# Patient Record
Sex: Male | Born: 1955
Health system: Southern US, Community
[De-identification: ages and names within clinical notes are randomized; demographics above are authoritative.]

## PROBLEM LIST (undated history)

## (undated) DIAGNOSIS — J449 Chronic obstructive pulmonary disease, unspecified: Secondary | ICD-10-CM

## (undated) DIAGNOSIS — M1990 Unspecified osteoarthritis, unspecified site: Secondary | ICD-10-CM

## (undated) DIAGNOSIS — K08109 Complete loss of teeth, unspecified cause, unspecified class: Secondary | ICD-10-CM

## (undated) DIAGNOSIS — Z87442 Personal history of urinary calculi: Secondary | ICD-10-CM

## (undated) DIAGNOSIS — T7840XA Allergy, unspecified, initial encounter: Secondary | ICD-10-CM

## (undated) DIAGNOSIS — Z972 Presence of dental prosthetic device (complete) (partial): Secondary | ICD-10-CM

## (undated) DIAGNOSIS — Z973 Presence of spectacles and contact lenses: Secondary | ICD-10-CM

## (undated) DIAGNOSIS — F419 Anxiety disorder, unspecified: Secondary | ICD-10-CM

## (undated) DIAGNOSIS — M109 Gout, unspecified: Secondary | ICD-10-CM

## (undated) DIAGNOSIS — R0600 Dyspnea, unspecified: Secondary | ICD-10-CM

## (undated) DIAGNOSIS — F1020 Alcohol dependence, uncomplicated: Secondary | ICD-10-CM

## (undated) DIAGNOSIS — I10 Essential (primary) hypertension: Secondary | ICD-10-CM

## (undated) DIAGNOSIS — K5792 Diverticulitis of intestine, part unspecified, without perforation or abscess without bleeding: Secondary | ICD-10-CM

## (undated) HISTORY — DX: Chronic obstructive pulmonary disease, unspecified: J44.9

## (undated) HISTORY — DX: Allergy, unspecified, initial encounter: T78.40XA

## (undated) HISTORY — PX: HERNIA REPAIR: SHX51

## (undated) HISTORY — DX: Diverticulitis of intestine, part unspecified, without perforation or abscess without bleeding: K57.92

## (undated) HISTORY — DX: Anxiety disorder, unspecified: F41.9

## (undated) HISTORY — DX: Essential (primary) hypertension: I10

## (undated) HISTORY — DX: Alcohol dependence, uncomplicated: F10.20

## (undated) HISTORY — PX: COLONOSCOPY: SHX174

---

## 1988-12-12 HISTORY — PX: FINGER ARTHROPLASTY: SHX5017

## 2000-12-26 ENCOUNTER — Encounter: Payer: Self-pay | Admitting: Urology

## 2000-12-26 ENCOUNTER — Encounter: Admission: RE | Admit: 2000-12-26 | Discharge: 2000-12-26 | Payer: Self-pay | Admitting: Urology

## 2001-04-29 ENCOUNTER — Emergency Department (HOSPITAL_COMMUNITY): Admission: EM | Admit: 2001-04-29 | Discharge: 2001-04-29 | Payer: Self-pay | Admitting: Emergency Medicine

## 2001-04-30 ENCOUNTER — Encounter: Payer: Self-pay | Admitting: Emergency Medicine

## 2001-05-04 ENCOUNTER — Inpatient Hospital Stay (HOSPITAL_COMMUNITY): Admission: EM | Admit: 2001-05-04 | Discharge: 2001-05-11 | Payer: Self-pay | Admitting: Emergency Medicine

## 2001-05-05 ENCOUNTER — Encounter: Payer: Self-pay | Admitting: Internal Medicine

## 2001-05-06 ENCOUNTER — Encounter: Payer: Self-pay | Admitting: Internal Medicine

## 2001-05-08 ENCOUNTER — Encounter: Payer: Self-pay | Admitting: Cardiology

## 2001-05-09 ENCOUNTER — Encounter: Payer: Self-pay | Admitting: Infectious Diseases

## 2001-05-10 ENCOUNTER — Encounter: Payer: Self-pay | Admitting: Internal Medicine

## 2008-04-03 ENCOUNTER — Ambulatory Visit: Payer: Self-pay | Admitting: Internal Medicine

## 2008-04-07 ENCOUNTER — Telehealth (INDEPENDENT_AMBULATORY_CARE_PROVIDER_SITE_OTHER): Payer: Self-pay

## 2008-04-10 ENCOUNTER — Ambulatory Visit: Payer: Self-pay | Admitting: Internal Medicine

## 2008-04-10 ENCOUNTER — Encounter (INDEPENDENT_AMBULATORY_CARE_PROVIDER_SITE_OTHER): Payer: Self-pay | Admitting: *Deleted

## 2008-04-11 ENCOUNTER — Encounter: Payer: Self-pay | Admitting: Internal Medicine

## 2008-04-12 ENCOUNTER — Inpatient Hospital Stay (HOSPITAL_COMMUNITY): Admission: EM | Admit: 2008-04-12 | Discharge: 2008-04-14 | Payer: Self-pay | Admitting: Emergency Medicine

## 2008-04-17 ENCOUNTER — Ambulatory Visit: Payer: Self-pay | Admitting: Gastroenterology

## 2009-12-12 DIAGNOSIS — K5792 Diverticulitis of intestine, part unspecified, without perforation or abscess without bleeding: Secondary | ICD-10-CM

## 2009-12-12 HISTORY — PX: COLON SURGERY: SHX602

## 2009-12-12 HISTORY — DX: Diverticulitis of intestine, part unspecified, without perforation or abscess without bleeding: K57.92

## 2010-07-07 ENCOUNTER — Encounter: Admission: RE | Admit: 2010-07-07 | Discharge: 2010-07-07 | Payer: Self-pay | Admitting: Family Medicine

## 2010-07-07 ENCOUNTER — Encounter: Payer: Self-pay | Admitting: Internal Medicine

## 2010-07-11 ENCOUNTER — Encounter: Payer: Self-pay | Admitting: Internal Medicine

## 2010-08-04 ENCOUNTER — Telehealth: Payer: Self-pay | Admitting: Internal Medicine

## 2010-08-06 ENCOUNTER — Encounter (INDEPENDENT_AMBULATORY_CARE_PROVIDER_SITE_OTHER): Payer: Self-pay | Admitting: *Deleted

## 2010-08-11 ENCOUNTER — Telehealth: Payer: Self-pay | Admitting: Internal Medicine

## 2010-08-13 ENCOUNTER — Ambulatory Visit: Payer: Self-pay | Admitting: Internal Medicine

## 2010-08-13 DIAGNOSIS — R1032 Left lower quadrant pain: Secondary | ICD-10-CM | POA: Insufficient documentation

## 2010-08-13 DIAGNOSIS — K5730 Diverticulosis of large intestine without perforation or abscess without bleeding: Secondary | ICD-10-CM | POA: Insufficient documentation

## 2010-08-13 DIAGNOSIS — K5732 Diverticulitis of large intestine without perforation or abscess without bleeding: Secondary | ICD-10-CM | POA: Insufficient documentation

## 2010-08-13 DIAGNOSIS — Z860100 Personal history of colon polyps, unspecified: Secondary | ICD-10-CM | POA: Insufficient documentation

## 2010-08-13 DIAGNOSIS — R930 Abnormal findings on diagnostic imaging of skull and head, not elsewhere classified: Secondary | ICD-10-CM | POA: Insufficient documentation

## 2010-08-24 ENCOUNTER — Telehealth: Payer: Self-pay | Admitting: Internal Medicine

## 2010-08-27 ENCOUNTER — Ambulatory Visit: Payer: Self-pay | Admitting: Internal Medicine

## 2010-08-27 LAB — CONVERTED CEMR LAB
Basophils Absolute: 0 10*3/uL (ref 0.0–0.1)
Basophils Relative: 0.4 % (ref 0.0–3.0)
CRP, High Sensitivity: 8.68 — ABNORMAL HIGH (ref 0.00–5.00)
Eosinophils Absolute: 0.1 10*3/uL (ref 0.0–0.7)
Eosinophils Relative: 2.1 % (ref 0.0–5.0)
HCT: 42.3 % (ref 39.0–52.0)
Hemoglobin: 15.2 g/dL (ref 13.0–17.0)
Lymphocytes Relative: 33.7 % (ref 12.0–46.0)
Lymphs Abs: 1.6 10*3/uL (ref 0.7–4.0)
MCHC: 35.9 g/dL (ref 30.0–36.0)
MCV: 103.5 fL — ABNORMAL HIGH (ref 78.0–100.0)
Monocytes Absolute: 0.5 10*3/uL (ref 0.1–1.0)
Monocytes Relative: 10 % (ref 3.0–12.0)
Neutro Abs: 2.6 10*3/uL (ref 1.4–7.7)
Neutrophils Relative %: 53.8 % (ref 43.0–77.0)
Platelets: 118 10*3/uL — ABNORMAL LOW (ref 150.0–400.0)
RBC: 4.09 M/uL — ABNORMAL LOW (ref 4.22–5.81)
RDW: 13.6 % (ref 11.5–14.6)
WBC: 4.8 10*3/uL (ref 4.5–10.5)

## 2010-08-30 ENCOUNTER — Encounter: Payer: Self-pay | Admitting: Internal Medicine

## 2010-08-30 ENCOUNTER — Telehealth: Payer: Self-pay | Admitting: Physician Assistant

## 2010-09-01 ENCOUNTER — Telehealth: Payer: Self-pay | Admitting: Internal Medicine

## 2010-09-13 ENCOUNTER — Telehealth: Payer: Self-pay | Admitting: Physician Assistant

## 2010-09-13 ENCOUNTER — Encounter: Payer: Self-pay | Admitting: Internal Medicine

## 2010-09-16 ENCOUNTER — Telehealth: Payer: Self-pay | Admitting: Internal Medicine

## 2010-09-16 ENCOUNTER — Ambulatory Visit: Payer: Self-pay | Admitting: Internal Medicine

## 2010-09-16 DIAGNOSIS — R935 Abnormal findings on diagnostic imaging of other abdominal regions, including retroperitoneum: Secondary | ICD-10-CM | POA: Insufficient documentation

## 2010-09-21 ENCOUNTER — Telehealth: Payer: Self-pay | Admitting: Physician Assistant

## 2010-09-27 ENCOUNTER — Ambulatory Visit (HOSPITAL_COMMUNITY): Admission: RE | Admit: 2010-09-27 | Discharge: 2010-09-27 | Payer: Self-pay | Admitting: Internal Medicine

## 2010-10-05 ENCOUNTER — Inpatient Hospital Stay (HOSPITAL_COMMUNITY): Admission: EM | Admit: 2010-10-05 | Discharge: 2010-10-11 | Payer: Self-pay | Admitting: Emergency Medicine

## 2010-10-06 ENCOUNTER — Encounter (INDEPENDENT_AMBULATORY_CARE_PROVIDER_SITE_OTHER): Payer: Self-pay | Admitting: *Deleted

## 2010-10-19 ENCOUNTER — Telehealth: Payer: Self-pay | Admitting: Internal Medicine

## 2010-10-26 ENCOUNTER — Encounter: Payer: Self-pay | Admitting: Internal Medicine

## 2010-10-27 ENCOUNTER — Telehealth: Payer: Self-pay | Admitting: Internal Medicine

## 2010-11-09 ENCOUNTER — Telehealth (INDEPENDENT_AMBULATORY_CARE_PROVIDER_SITE_OTHER): Payer: Self-pay | Admitting: *Deleted

## 2010-11-22 ENCOUNTER — Inpatient Hospital Stay (HOSPITAL_COMMUNITY)
Admission: RE | Admit: 2010-11-22 | Discharge: 2010-11-27 | Payer: Self-pay | Source: Home / Self Care | Attending: Surgery | Admitting: Surgery

## 2010-11-22 ENCOUNTER — Encounter (INDEPENDENT_AMBULATORY_CARE_PROVIDER_SITE_OTHER): Payer: Self-pay | Admitting: Surgery

## 2011-01-01 ENCOUNTER — Encounter: Payer: Self-pay | Admitting: Internal Medicine

## 2011-01-11 NOTE — Procedures (Signed)
Summary: Colon   Colonoscopy  Procedure date:  04/10/2008  Findings:      Location:  [facility].    Colonoscopy  Procedure date:  04/10/2008  Findings:      Location:  [facility].   Patient Name: Jonathan Harrington, Jonathan Harrington MRN:  Procedure Procedures: Colonoscopy CPT: 03474.    with polypectomy. CPT: [id number].  Personnel: Endoscopist: [name]. [name], MD.  Referred By: Abbe Amsterdam, MD.  Exam Location: Exam performed in Outpatient Clinic. Outpatient  Patient Consent: Procedure, Alternatives, Risks and Benefits discussed, consent obtained, from patient. Consent was obtained by the RN.  Indications Symptoms: Hematochezia. Change in bowel habits.  Average Risk Screening Routine.  History  Current Medications: Patient is not currently taking Coumadin.  Pre-Exam Physical: Performed Apr 10, 2008. Cardio-pulmonary exam, Rectal exam, HEENT exam , Abdominal exam, Mental status exam WNL.  Comments: Pt. history reviewed/updated, physical exam performed prior to initiation of sedation?yes Exam Exam: Extent of exam reached: Cecum, extent intended: Cecum.  The cecum was identified by appendiceal orifice and IC valve. Patient position: on left side. Time to Cecum: 00:03:33. Time for Withdrawl: 00:07:25. Colon retroflexion performed. Images taken. ASA Classification: I. Tolerance: excellent.  Monitoring: Pulse and BP monitoring, Oximetry used. Supplemental O2 given.  Colon Prep Used MOVI PREP for colon prep. Prep results: excellent.  Sedation Meds: Patient assessed and found to be appropriate for moderate (conscious) sedation. Fentanyl 100 mcg. given IV. Versed 10 mg. given IV. BENADRYL 25 given IV.  Findings POLYP: Ascending Colon, Maximum size: 3 mm. Procedure:  snare without cautery, removed, retrieved, Polyp sent to pathology. ICD9: Colon Polyps: 211.3.  NORMAL EXAM: Cecum. Comments: traumatic submucosal hemorrhage right colon.  - DIVERTICULOSIS:  Ascending Colon to Sigmoid Colon. ICD9: Diverticulosis, Colon: 562.10. Comments: Marked changes w/ stenosis @25cm .  POLYP: Rectum, Maximum size: 3 mm. Procedure:  snare without cautery, removed, retrieved, sent to pathology. ICD9: Colon Polyps: 211.3.  HEMORRHOIDS: Internal. ICD9: Hemorrhoids, Internal: 455.0.   Assessment  Diagnoses: 455.0: Hemorrhoids, Internal. Cause for recent bleeding.  562.10: Diverticulosis, Colon. Marked changes w/ stenosis.  211.3: Colon Polyps.  x 2.   Events  Unplanned Interventions: No intervention was required.  Unplanned Events: There were no complications. Plans Patient Education: Patient given standard instructions for: Hemorrhoids.  Disposition: After procedure patient sent to recovery. After recovery patient sent home.  Scheduling/Referral: Colonoscopy, to [name]. [name], MD, in 5 years if polyp(s) adenomatous; otherwise 10 years,    This report was created from the original endoscopy report, which was reviewed and signed by the above listed endoscopist.

## 2011-01-11 NOTE — Letter (Signed)
Summary: [facility]   Imported By: Sherian Rein 11/01/2010 11:51:32  _____________________________________________________________________  External Attachment:    Type:   Image     Comment:   External Document

## 2011-01-11 NOTE — Progress Notes (Signed)
Summary: Triage  Phone Note Call from Patient Call back at [phone]   Caller: Patient Call For: Mike Gip Reason for Call: Talk to Nurse Summary of Call: Feels like he has a flareup of Diverticulitis and has COL sch'd for Wed. Would like to discuss Initial call taken by: Karna Christmas,  September 13, 2010 10:20 AM  Follow-up for Phone Call        The pt stated that starting this AM,  he has had 3-4 normal BM's but frequent with cramping.  He said he has his Colonoscopy on Wed 09-15-10.  He said when he was taking Cipro before, he increased his dose from 1 twice daily to 1  and 1/4 tab twice daily and that was effective. Pt concerned and said he was told to call.  Follow-up by: Joselyn Glassman,  September 13, 2010 1:56 PM  Additional Follow-up for Phone Call Additional follow up Details #1::        MY LAST NOTE FROM 08/27/10-PLAN WAS FOR HIM TO STAY ON CIPRO 500 MG  TWICE DAILY THRU THE COLONOSCOPY ON THE 5TH. WHEN DID HE STOP IT ?   YES HE SHOULD GO BACK ON CIPRO 500 MG TWICE DAILY,AND PROBABLY GOOD IDEA TO RESCHEDULE THE COLON FOR NEXT WEEKOR THE WEEK AFTER.   STAY ON CIPRO FOR 2 MORE WEEKS. Additional Follow-up by: Peterson Ao,  September 13, 2010 3:39 PM    Additional Follow-up for Phone Call Additional follow up Details #2::    I sent the pt a letter with the date of the rescheduled Colonoscopy for 09-23-10.  I also mailed him the new patient instrucitons.  I advised him I sent another presccription for more cipro if he needs it.    I let him know Amy thinks he needs to take Cipro for another two weeks and rescheule the Colonoscopy from 09-15-10 to 1-2 weeks out.  I did pick 09-23-10.    Follow-up by: Joselyn Glassman,  September 13, 2010 4:28 PM  Prescriptions: CIPROFLOXACIN HCL 500 MG TABS (CIPROFLOXACIN HCL) Take 1 by mouth twice daily  #14 x 0   Entered by:   Lowry Ram NCMA   Authorized by:   Sammuel Cooper PA-c   Signed by:   Lowry Ram NCMA on 09/13/2010  Method used:   [facility]  [address] [id number]       [address]       [place], [place]  [address]       Ph: [phone] or [phone]       Fax: [phone]   RxID:   [id number]

## 2011-01-11 NOTE — Assessment & Plan Note (Signed)
Summary: f/u diverticulitis-Perry pt.   History of Present Illness Visit Type: Follow-up Visit Primary GI MD: Yancey Flemings MD Primary Vern Guerette: Placido Sou Requesting Breckon Reeves: na Chief Complaint: Bloating and loose stools  History of Present Illness:   PLEASANT 55 YO MALE KNOWN TO DR. PERRY WHO WAS SEEN ON 08/13/10.HE HAS BEEN HAVING RECURRENT EPISODES OF DIVERTICULITIS. AT THAT TIME HE WAS IMPROVING ON A COURSE OF AVELOX AND IT WAS EXTENDED OUT TO COMPLETE 10 DAYS. HE WAS ASLO SCHEDULES FOR A FOLLOW UP COLONOSCOPY WITH DR. PERRY ON 09/15/10. HE HAD A CT IN JULY WHICH RAISED THE QUESTION OF A NEOPLASM IN THE SIGMOID COLON WITH MARKEDLY THICKENED COLON IN THAT SEGMENT. WE HAVE ALSO DISCUSSED   ELETIVE SIGMOID RESECTION AND HE IS WANTING TO PROCEED WITH THIS.   MR. Maudlin COMES BACK IN TODAY WITH RECURRENT SXS. HE FINISHED THE AVELOX AND WAS DOING WELL FOR ABOUT A WEEK THEN THE PAIN AND CRAMPING RECURRED. NO DOCUMENTED FEVER,SWEATS. NO N/V. HE CALLED AND WAS TO START CIPRO AND FLAGYL ON  9/13 BUT WAS RELUCTANT AS HE IS AN ALCOHOLIC  AND DID NOT WANT TO TAKE THE FLAGYL. HE HAS NOW STARTED THE CIPRO AND IS FEELING A LITTLE BETTER. PAIN IS NOW A 4-5/10 WAS( 8)  . BM' FAIRLY NORMAL. NO DYSURIA.    GI Review of Systems    Reports abdominal pain and  bloating.     Location of  Abdominal pain: LLQ.    Denies acid reflux, belching, chest pain, dysphagia with liquids, dysphagia with solids, heartburn, loss of appetite, nausea, vomiting, vomiting blood, weight loss, and  weight gain.      Reports diverticulosis.     Denies anal fissure, black tarry stools, change in bowel habit, constipation, diarrhea, fecal incontinence, heme positive stool, hemorrhoids, irritable bowel syndrome, jaundice, light color stool, liver problems, rectal bleeding, and  rectal pain.    Current Medications (verified): 1)  Xanax 1 Mg Tabs (Alprazolam) .... 1/2 By Mouth Once Daily 2)  Ciprofloxacin Hcl 500 Mg Tabs  (Ciprofloxacin Hcl) .... Take 1 P.o. B.i.d. For 14 Days 3)  Goodys Body Pain 500-325 Mg Pack (Aspirin-Acetaminophen) .... As Needed  Allergies (verified): No Known Drug Allergies  Past History:  Past Medical History: DIVERTICULOSIS/RECURRENT DIVERTICULITIS GOUT ADENOMATOUS COLON POLYPS ALCOHOL ABUSE  Past Surgical History: Reviewed history from 08/13/2010 and no changes required. Surgery on left index finger  Family History: Reviewed history from 08/13/2010 and no changes required. No FH of Colon Cancer: Lung Cancer-Sister  Social History: Reviewed history from 08/13/2010 and no changes required. Patient currently smokes. 1 1/2 PPD Alcohol Use - yes   daily 2 SHOTS AND FEW BEERS Illicit Drug Use - no Daily Caffeine Use  coffee in the AM Occupation: control specialist  Review of Systems       The patient complains of fatigue.  The patient denies allergy/sinus, anemia, anxiety-new, arthritis/joint pain, back pain, blood in urine, breast changes/lumps, change in vision, confusion, cough, coughing up blood, depression-new, fainting, fever, headaches-new, hearing problems, heart murmur, heart rhythm changes, itching, menstrual pain, muscle pains/cramps, night sweats, nosebleeds, pregnancy symptoms, shortness of breath, skin rash, sleeping problems, sore throat, swelling of feet/legs, swollen lymph glands, thirst - excessive, urination - excessive, urination changes/pain, urine leakage, vision changes, and voice change.         SEE HPI  Vital Signs:  Patient profile:   55 year old male Height:      72 inches Weight:  167 pounds BMI:     22.73 BSA:     1.97 Temp:     98.1 degrees F oral Pulse rate:   72 / minute Pulse rhythm:   regular BP sitting:   142 / 80  (left arm) Cuff size:   regular  Vitals Entered By: Ok Anis CMA (August 27, 2010 1:13 PM)  Physical Exam  General:  Well developed, well nourished, no acute distress,THIN. Head:  Normocephalic and  atraumatic. Eyes:  PERRLA, no icterus. Neck:  Supple; no masses or thyromegaly. Lungs:  Clear throughout to auscultation. Heart:  Regular rate and rhythm; no murmurs, rubs,  or bruits. Abdomen:  SOFT, TENDER LLQ, NO GUARDING OR REBOUND, NO MASS OR HSM,BS+ Rectal:  NOT DONE Extremities:  No clubbing, cyanosis, edema or deformities noted. Neurologic:  Alert and  oriented x4;  grossly normal neurologically. Psych:  Alert and cooperative. Normal mood and affect.anxious.     Impression & Recommendations:  Problem # 1:  DIVERTICULITIS, COLON (ICD-562.11) Assessment Deteriorated  55 Y.O MALE WITH RECURENT DIVERTICULITIS.  TWO EPISODES IN THE PAST COUPLE MONTHS-NOW WITH RECURRENT SXS ONE WEEK AFTER FINISHING LAST COURSE OF ABX. 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  LABS TODAY CONTINUE CIPRO 500 MG TWICE DAILY- WILL KEEP HIM ON ABX ABOUT 3 WEEKS TOTAL TO GET HIM THRU COLONOSCOPY WHICH IS SCHEDULED 09/15/10 WITH DR. PERRY. PT IS TO CALL IF SXS WORSEN OR IF HE DEVELOPS FEVER IN THE INTERIM.  OBTAIN APPT WITH SURGERY TO DISCUSS ELECTIVE LEFT COLON RESECTION.  Orders: TLB-CBC Platelet - w/Differential (85025-CBCD) TLB-CRP-High Sensitivity (C-Reactive Protein) (86140-FCRP)  Problem # 2:  PERSONAL HX COLONIC POLYPS (ICD-V12.72) Assessment: Comment Only ADENOMATOUS POLYPS 2009  Problem # 3:  ETOH ABUSE Assessment: Comment Only PT HAS BEEN COUNSELLED , CONTINUED DAILY ETOH USE.  Patient Instructions: 1)  Please go to lab, basement level. 2)  We sent a prescription for 24 more Cipro 500 MG, take twice daily until 09-14-10. 3)  We called [facility] and someone from there will be calling you about an appointment with  surgeon.   4)  Copy sent to :  Warner Mccreedy, MD 5)  The medication list was reviewed and reconciled.  All changed / newly prescribed medications were explained.  A complete medication list was provided to the patient /  caregiver. Prescriptions: CIPROFLOXACIN HCL 500 MG TABS (CIPROFLOXACIN HCL) Take 1 by mouth twice daily  #24 x 0   Entered by:   Lowry Ram NCMA   Authorized by:   Sammuel Cooper PA-c   Signed by:   Lowry Ram NCMA on 08/27/2010   Method used:   [facility]  [address] [id number]       [address]       [place], [place]  [address]       Ph: [phone] or [phone]       Fax: [phone]   RxID:   [id number]

## 2011-01-11 NOTE — Progress Notes (Signed)
Summary: Triage /pain and bowel problems  Phone Note Call from Patient Call back at Home Phone [phone]   Caller: Spouse  Willaim Bane Call For: Dr. [name] Summary of Call: pt. is requesting to speak directly with Dr. [name] regarding her husband Initial call taken by: Karna Christmas,  September 16, 2010 10:24 AM  Follow-up for Phone Call        line busy Chales Abrahams CMA Duncan Dull)  September 16, 2010 10:33 AM   left message on machine to call back Chales Abrahams CMA Duncan Dull)  September 16, 2010 1:49 PM   Pt and spouse confused about Colon procedure.  Spoke with Amy who rescheduled  for 09/23/10 because of the diverticulitis.  Pt is taking Cipro two times a day but continues to have lower abd pain.   He has a decreased appetite.  No fever, rectal bleeding.  He feels very bloated, not having as many bowel movements. His normal is 3-4 times daily now he is only going 1 time per day.  Pt spouse concerned and would like something else done for the pt until the colonoscopy appt.  Please advise Chales Abrahams CMA Duncan Dull)  September 16, 2010 2:26 PM   Additional Follow-up for Phone Call Additional follow up Details #1::        Called pt and asked about an ER visit from 09/01/10.  He did not go at that time.  He has only seen Amy on 08/27/10.  The wife is worried and said if she needs to have him evaluated at the ER she will take him. Additional Follow-up by: Chales Abrahams CMA Duncan Dull),  September 16, 2010 2:34 PM    Additional Follow-up for Phone Call Additional follow up Details #2::    pt has been added to Amy's schedule for today at 3:30.  Pt wife is aware Follow-up by: Chales Abrahams CMA Duncan Dull),  September 16, 2010 2:54 PM

## 2011-01-11 NOTE — Progress Notes (Signed)
  Phone Note Outgoing Call   Call placed by: Joselyn Glassman,  August 30, 2010 10:27 AM Call placed to: Patient Summary of Call: Lm on pt's home number [phone] on the ans machine.  I advised him of the appt with Dr. Cyndia Bent at [facility] on 09-16-10 .  He is to arrive at 4Pm and the appt is for 4:20 AM.  I left him the address and phone number of [facility] if he needs to call them. I faxed the office notes, labs, CT scan results. Initial call taken by: Joselyn Glassman,  August 30, 2010 10:29 AM

## 2011-01-11 NOTE — Progress Notes (Signed)
Summary: med ?'s   Phone Note Call from Patient Call back at [facility] Phone [phone]   Caller: Patient Call For: Dr. [name] Reason for Call: Talk to Nurse Summary of Call: would like to discuss medication change... does not know medication names Initial call taken by: Vallarie Mare,  August 24, 2010 3:42 PM  Follow-up for Phone Call        Pt. got Cipro and Flagyl but says he drinks alcohol daily and warning says he  can not take with alcohol.Instructed to hold med's until Dr.Retia Cordle  made aware. Follow-up by: Teryl Lucy RN,  August 24, 2010 4:57 PM  Additional Follow-up for Phone Call Additional follow up Details #1::        Don't drink alcohol! Additional Follow-up by: Hilarie Fredrickson MD,  August 24, 2010 5:01 PM    Additional Follow-up for Phone Call Additional follow up Details #2::    Pt. wants Avelox says he has been alcoholic for 22 yrs. and can't stop drinking. Told not to take med. and Iiwill call him back in am after discussing with Dr.Caige Almeda. Follow-up by: Teryl Lucy RN,  August 24, 2010 5:11 PM  Additional Follow-up for Phone Call Additional follow up Details #3:: Details for Additional Follow-up Action Taken: just take Cipro then. Keep followup Additional Follow-up by: Hilarie Fredrickson MD,  August 25, 2010 9:49 AM  Unable to reach pt. so wife ntfd. of Dr.Radwan Cowley's orders for antibiotics and to keep o.v. for Friday.

## 2011-01-11 NOTE — Assessment & Plan Note (Signed)
Summary: continued abd pain, bloating, change in bowels/pl            ...   History of Present Illness Visit Type: Follow-up Visit Primary GI MD: Yancey Flemings MD Primary Provider: Placido Sou Requesting Provider: na Chief Complaint: abdominal pain more severe since Monday,  History of Present Illness:   54 YO MALE WITH HX OF RECURRENT DIVERTICULITIS. LAST COLONOSCOPY 4/09 SHOWED TWO POLYPS,BAROTRAUMA OF RIGHT COLON, AND MARKED DIVERTICULAR CHANGES WITH STENOSIS SIGMOID COLON,AND DIVERTICULAR DISEASE ASCENDING TO SIGMOID COLON. HE HAS HAD SEVERAL RECENT EPISODES,TREATED BY PRIMARY CARE. HE HAD A CT SCAN 7/11 WHICH SHOWED A MARKEDLY THICKENED COLON IN THE SIGMOID-COULD NOT R/O NEOPLASM. HE WAS TREATED IN JULY WITH ABX, THEN TOOK A COURSE OF AVELOX AT THE END OF AUGUST FOR RECURRENCE.  WE EXTENDED THAT COURSE 9/2 AS HED NOT COMPLETELY RESOLVED. HIS PAIN WENT AWAY THEN A WEEK AFTER STOPPING ABX IT RECURRED. HE WAS TO TAKE CIPRO  ANF FLAGYL BUT REFUSED THE FLAGYL AS HE DRINKS ALCOHOL DAILY. HE HAS BEEN ON CIPRO 500 MG TWICE DAILY SINCE 9/16. HE SAYS HE HAS HAD MORE PAIN THE PAST 3 DAYS DESPITE ABX. HE RATES IT  AS "2-3",CRAMPY LLQ. NO FEVER, BM'S OK.   WE HAD A COLONOSCOPY SCHEDULED ,THEN RESCHEDULED FOR 10/13 TO ALLOW TIME TO RESOLVE DIVERTICULITIS.   GI Review of Systems    Reports abdominal pain and  bloating.     Location of  Abdominal pain: lower abdomen.    Denies acid reflux, belching, chest pain, dysphagia with liquids, dysphagia with solids, heartburn, loss of appetite, nausea, vomiting, vomiting blood, weight loss, and  weight gain.      Reports change in bowel habits.     Denies anal fissure, black tarry stools, constipation, diarrhea, diverticulosis, fecal incontinence, heme positive stool, hemorrhoids, irritable bowel syndrome, jaundice, light color stool, liver problems, rectal bleeding, and  rectal pain.    Current Medications (verified): 1)  Xanax 1 Mg Tabs (Alprazolam) ....  1/2 By Mouth Once Daily 2)  Ciprofloxacin Hcl 500 Mg Tabs (Ciprofloxacin Hcl) .... Take 1 By Mouth Twice Daily 3)  Goodys Body Pain 500-325 Mg Pack (Aspirin-Acetaminophen) .... As Needed  Allergies (verified): No Known Drug Allergies  Past History:  Past Medical History: DIVERTICULOSIS/RECURRENT DIVERTICULITIS GOUT ADENOMATOUS COLON POLYPS 4/09 ALCOHOL ABUSE  Past Surgical History: Surgery on left index finger  Family History: Reviewed history from 08/13/2010 and no changes required. No FH of Colon Cancer: Lung Cancer-Sister Family History of Liver Disease/Cirrhosis:Sister  Social History: Patient currently smokes. 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  Review of Systems  The patient denies allergy/sinus, anemia, anxiety-new, arthritis/joint pain, back pain, blood in urine, breast changes/lumps, change in vision, confusion, cough, coughing up blood, depression-new, fainting, fatigue, fever, headaches-new, hearing problems, heart murmur, heart rhythm changes, itching, menstrual pain, muscle pains/cramps, night sweats, nosebleeds, pregnancy symptoms, shortness of breath, skin rash, sleeping problems, sore throat, swelling of feet/legs, swollen lymph glands, thirst - excessive , urination - excessive , urination changes/pain, urine leakage, vision changes, and voice change.         SEE HPI  Vital Signs:  Patient profile:   55 year old male Height:      72 inches Weight:      167 pounds BMI:     22.73 Pulse rate:  72 / minute Pulse rhythm:   regular BP sitting:   124 / 70  (left arm) Cuff size:   regular  Vitals Entered By: June McMurray CMA Duncan Dull) (September 16, 2010 3:37 PM)  Physical Exam  General:  Well developed, well nourished, no acute distress. Head:  Normocephalic and atraumatic. Eyes:  PERRLA, no icterus. Lungs:  Clear  throughout to auscultation.decreased BS bilateral.   Heart:  Regular rate and rhythm; no murmurs, rubs,  or bruits. Abdomen:  SOFT, MILD TENDERNESS LLQ, NO REBOUND, NO MASS OR HSM,BS+ Rectal:  NOT DONE Extremities:  No clubbing, cyanosis, edema or deformities noted. Neurologic:  Alert and  oriented x4;  grossly normal neurologically. Psych:  Alert and cooperative. Normal mood and affect.   Impression & Recommendations:  Problem # 1:  DIVERTICULITIS, COLON (ICD-562.11) Assessment Deteriorated 54 YO MALE WITH RECURRENT DIVERTICULITIS WITH BACK TO BACK EPISODES OVER THE PAST COUPLE MONTHS. HE HAS MARKED DISEASE IN THE SIGMOID WITH STENOSIS ON COLONOSCOPY 4/09. UNCLEAR AT PRESENT WHETHER HE HAS PERSISTENT SMOLDERING DIVERTICULITIS VS PAIN SECONDARY TO STENOSIS. DOUBT NEOPLASM WITH PREVIOUS COLON 2 YEARS AGO.  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  WILL CANCEL COLONOSCOPY AND PROCEED WITH AIR CONTRAST BE TO DELINEATE STENOSIS,R/O NEOPLASM, CONTINUE ANTIBIOTIC-SWITCH TO AVELOX 400 MG DAILY X 10 DAYS RESCHEDULE SURGICAL APPT  FOR NEXT WEEK SO CAN PROCEED TOWARD RESECTION. PLANS WERE DISCUSSED IN DETAIL WITH DR. PERRY, AND PT. Orders: Air Contrast  BE (Air Contrast BE)  ADDENDUM: I saw and examined the patient today in the office with the physician assistant. The patient has had significant recurrent symptomatic diverticulitis and would likely benefit from elective surgical resection. Not clear that he is having active diverticulitis at this time as opposed to mild obstructive symptoms. Given the results of his colonoscopy 2 years ago and CT scan a few months ago, I would proceed with barium enema to define the anatomy and then surgical consultation. In the interim, okay to change antimicrobial therapy but chance he has some mild smoldering diverticulitis.  Problem # 2:  PERSONAL HX COLONIC POLYPS (ICD-V12.72) Assessment: Comment Only ADENOMATOUS 4/09-F/U DUE 2014  Problem # 3:  ETOH  ABUSE Assessment: Unchanged CHRONIC DAILY ALCOHOL USE - UP TO ONE PINT  NO EVIDENCE FOR CIRRHOSIS ON CT, THOUGH MAY PRESENT PROBLEM IN POST OPERATIVE SETTING WITH WITHDRAWAL.  Patient Instructions: 1)  You have been scheduled for a Air Contrasted Barium Enema.  2)  Your appt with [facility], Dr. Johna Sheriff on 09-24-10 at 3:15 arrival a 3:30 appointment. 3)  Stop Cipro and start Avelox one tablet by mouth once daily x 10 days. 4)  Copy sent to : Abbe Amsterdam, MD, Dr. Glenna Fellows 5)  The medication list was reviewed and reconciled.  All changed / newly prescribed medications were explained.  A complete medication list was provided to the patient / caregiver. Prescriptions: AVELOX 400 MG TABS (MOXIFLOXACIN HCL) one tablet by mouth once daily  #10 x 0   Entered by:   Christie Nottingham CMA (AAMA)   Authorized by:   Sammuel Cooper PA-c   Signed by:   Sammuel Cooper PA-c on 09/16/2010   Method used:   [facility]  [address] [id number]       [address]       [place], [place]  [address]       Ph: [phone] or [phone]  Fax: [phone]   RxID:   [id number]

## 2011-01-11 NOTE — Letter (Signed)
Summary: Appointment Reminder  [facility]  [address] [place], [place] [address]   Phone: [phone]  Fax: [phone]        August 06, 2010 MRN: [id number]    [facility] [address] [place], [place]  [address]    Dear Jonathan Harrington,   We have been unable to reach you by phone to schedule a follow up   appointment that was recommended for you by Dr. [name]. It is very   important that we reach you to schedule an appointment. We hope that you  allow [place] to participate in your health care needs. Please contact us at  [phone] at your earliest convenience to schedule your appointment.     Sincerely,    Chales Abrahams CMA (AAMA)  Appended Document: Appointment Reminder letter mailed

## 2011-01-11 NOTE — Progress Notes (Signed)
Summary: Abdominal pain  Phone Note Call from Patient Call back at Work Phone [phone] Call back at [phone]    Caller: Spouse Call For: Dr. [name] Summary of Call: patient having alot of abdominal pain worse than when he saw Amy last time. patient complains of constipation. patients wife gave the symptoms for the patient she states that patient is at work and can not have calls but he called her to give his symptoms.  Initial call taken by: Harlow Mares CMA Duncan Dull),  September 01, 2010 10:24 AM  Follow-up for Phone Call        Pt. has not had a b.m. since Sunday and abd. is bloated and he has lower abd. pain but is not centralized to one area.Wife afraid to give him a laxative. Follow-up by: Teryl Lucy RN,  September 01, 2010 10:40 AM  Additional Follow-up for Phone Call Additional follow up Details #1::        Needs to be seen. not clear if he simply needs miralax or something more like plain films, CT, or admission. Additional Follow-up by: Hilarie Fredrickson MD,  September 01, 2010 11:12 AM    Additional Follow-up for Phone Call Additional follow up Details #2::    Wife called and said pt. called from work and said is pain is getting worse.Do you want him to got to ER or see Dr.Brodie as she is Dr.of the Day as paula's schedule is full? Follow-up by: Teryl Lucy RN,  September 01, 2010 12:44 PM  Additional Follow-up for Phone Call Additional follow up Details #3:: Details for Additional Follow-up Action Taken: Go to ER then to be evaluated by EDP Additional Follow-up by: Hilarie Fredrickson MD,  September 01, 2010 12:51 PM  Pt. will go to [facility] ER.

## 2011-01-11 NOTE — Letter (Signed)
Summary: [facility] Instructions  [facility]  [address] [place], [place] [address]   Phone: [phone]  Fax: [phone]       Jonathan Harrington    1956/01/10    MRN: [id number]        Procedure Day /Date:09-23-10     Arrival Time:10:00 AM      Procedure Time: 11:00 AM     Location of Procedure:                    [facility] ([facility])  PREPARATION FOR COLONOSCOPY WITH MOVIPREP   Starting 5 days prior to your procedure 09-18-10 do not eat nuts, seeds, popcorn, corn, beans, peas,  salads, or any raw vegetables.  Do not take any fiber supplements (e.g. Metamucil, Citrucel, and Benefiber).  THE DAY BEFORE YOUR PROCEDURE         DATE: 09-22-10  DAY: Wednesday  1.  Drink clear liquids the entire day-NO SOLID FOOD  2.  Do not drink anything colored red or purple.  Avoid juices with pulp.  No orange juice.  3.  Drink at least 64 oz. (8 glasses) of fluid/clear liquids during the day to prevent dehydration and help the prep work efficiently.  CLEAR LIQUIDS INCLUDE: Water Jello Ice Popsicles Tea (sugar ok, no milk/cream) Powdered fruit flavored drinks Coffee (sugar ok, no milk/cream) Gatorade Juice: apple, white grape, white cranberry  Lemonade Clear bullion, consomm, broth Carbonated beverages (any kind) Strained chicken noodle soup Hard Candy                             4.  In the morning, mix first dose of MoviPrep solution:    Empty 1 Pouch A and 1 Pouch B into the disposable container    Add lukewarm drinking water to the top line of the container. Mix to dissolve    Refrigerate (mixed solution should be used within 24 hrs)  5.  Begin drinking the prep at 5:00 p.m. The MoviPrep container is divided by 4 marks.   Every 15 minutes drink the solution down to the next mark (approximately 8 oz) until the full liter is complete.   6.  Follow completed prep with 16 oz of clear liquid of your choice (Nothing red or purple).  Continue to  drink clear liquids until bedtime.  7.  Before going to bed, mix second dose of MoviPrep solution:    Empty 1 Pouch A and 1 Pouch B into the disposable container    Add lukewarm drinking water to the top line of the container. Mix to dissolve    Refrigerate  THE DAY OF YOUR PROCEDURE      DATE:09-23-10  DAY: Thursday  Beginning at 7:00 AM A.M. (5 hours before procedure):         1. Every 15 minutes, drink the solution down to the next mark (approx 8 oz) until the full liter is complete.  2. Follow completed prep with 16 oz. of clear liquid of your choice.    3. You may drink clear liquids until 9:00 AM  (2 HOURS BEFORE PROCEDURE).   MEDICATION INSTRUCTIONS  Unless otherwise instructed, you should take regular prescription medications with a small sip of water   as early as possible the morning of your procedure.        OTHER INSTRUCTIONS  You will need a responsible adult at least  57 years of age to accompany you and drive you home.   This person must remain in the waiting room during your procedure.  Wear loose fitting clothing that is easily removed.  Leave jewelry and other valuables at home.  However, you may wish to bring a book to read or  an iPod/MP3 player to listen to music as you wait for your procedure to start.  Remove all body piercing jewelry and leave at home.  Total time from sign-in until discharge is approximately 2-3 hours.  You should go home directly after your procedure and rest.  You can resume normal activities the  day after your procedure.  The day of your procedure you should not:   Drive   Make legal decisions   Operate machinery   Drink alcohol   Return to work  You will receive specific instructions about eating, activities and medications before you leave.    The above instructions have been reviewed and explained to me by   _______________________    I fully understand and can verbalize these instructions  _____________________________ Date _________

## 2011-01-11 NOTE — Letter (Signed)
Summary: [facility]  [facility]   Imported By: Sherian Rein 07/14/2010 15:06:15  _____________________________________________________________________  External Attachment:    Type:   Image     Comment:   External Document

## 2011-01-11 NOTE — Progress Notes (Signed)
Summary: prep ?'s  Phone Note Call from Patient Call back at Home Phone [phone]   Caller: wife, Delana Call For: Dr. [name] Reason for Call: Talk to Nurse Summary of Call: prep ?'s Initial call taken by: Vallarie Mare,  September 21, 2010 9:35 AM  Follow-up for Phone Call        Spoke to pt's wife several times today. He did not show for his Air Contrasat BE scheduled for Mon 09-20-10.  His wife said they didn't see a date on the prep instructions.  They thought it was for Fri 09-24-10.  The pt and his wife hiad this confused.  He did have an appt with Dr. Jaclynn Guarneri for Fri 09-24-10.  Per Mike Gip PA, she did suggest we reschedule the Air Contrast BE and reschedule the appt at [facility] for after we have results.  I made the appt for the Air Contrast BE  at [facility] for Mon 09-27-10 at 11 AM.  Told his wife he is to follow the same prep instructions and he will start that on Sun the 16th.  His appt is rescheduled with [facility] for 10-08-10 and he is to arrive at [facility].  The wife asked if it could be later in the day so she can go with him.  I told her to call and change it if she needs to. I gave her the number for [facility]. Follow-up by: Joselyn Glassman,  September 22, 2010 2:04 PM  Additional Follow-up for Phone Call Additional follow up Details #1::        THANK-YOU,GOOD JOB Additional Follow-up by: Peterson Ao,  September 22, 2010 2:18 PM     Appended Document: prep ?'s The pt's wife asked for her husband if he can drink ETOH on Sunday, the day he will be preping for the Air Contrast Barium Enema.  I called [facility] and was told by someone in Radiology that they would not suggest he drink past noon on Sunday 09-26-10.  I told Mrs. Sievers and she said she would tell her husband this information.

## 2011-01-11 NOTE — Assessment & Plan Note (Signed)
Summary: Abnormal C.T.   History of Present Illness Visit Type: Initial Consult Primary GI MD: Yancey Flemings MD Primary Riniyah Speich: Placido Sou Requesting Patric Vanpelt: Placido Sou Chief Complaint: Abnormal CT, abdominal pain,bloating and weight loss History of Present Illness:   PLEASANT 55 YO MALE KNOWN TO DR. PERRY. HE HAD A COLONOSCOPY IN 4/09 WHICH SHOWED MARKED DIVERTICULAR DISEASE IN THE SIGMOID COLON WITH STENOSIS,2,SMALL POLYPS,AND SUBMUCOSAL HEMORRHAGE IN THE RIGHT COLON. PT REQUIRED ADMISSION DUE TO PAIN WITHIN 24 HOURS OF PROCEDURE. 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  HE HAS HAD A COUPLE EPISODES OF DIVERTICULITIS IN THE PAST 2 YEARS, AND IS REFERRED NOW BECAUSE HE HAS HAD 2 EPISODES BACK TO BACK. ONE AT THE END OF JULY-HAD CT SHOWING  A SEGMENT OF SIGMOID MODERATELY TO MARKEDLY THICKENED,MILD SURROUNDING INFLAMMATORY CHANGE,PROMINENT BUT NOT PATHOLOGICALLY ENLARGED NODES AT THE ROOT OF THE SIGMOID MESOCOLON. COULD NOT R/O SIGMOID NEOPLASM. PT RESOLVED THAT EPISODE THEN HAD RECURRENT LLQ PAIN  A WEEK AGO. HE WAS STARTED ON A 7 DAY COURSE OF AVELOX. HE FEELS BETTER BU DISCOMFORT HAS NOT COMPLETELY RESOLVED. NO FEVER,BM'S NORMAL,NO HEME.  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   GI Review of Systems    Reports abdominal pain, bloating, and  weight loss.      Denies acid reflux, belching, chest pain, dysphagia with liquids, dysphagia with solids, heartburn, loss of appetite, nausea, vomiting, vomiting blood, and  weight gain.      Reports diverticulosis.     Denies anal fissure, black tarry stools, change in bowel habit, constipation, diarrhea, fecal incontinence, heme positive stool, hemorrhoids, irritable bowel syndrome, jaundice, light color stool, liver problems, rectal bleeding,  and  rectal pain. Preventive Screening-Counseling & Management  Alcohol-Tobacco     Smoking Status: current      Drug Use:  no.      Current Medications (verified): 1)  Xanax 1 Mg Tabs (Alprazolam) .... 1/2 By Mouth Once Daily 2)  Avelox 400 Mg Tabs (Moxifloxacin Hcl) .Marland Kitchen.. 1 By Mouth Once Daily  Allergies (verified): No Known Drug Allergies  Past History:  Past Medical History: DIVERTICULOSIS/RECURRENT DIVERTICULITIS GOUT ADENOMATOUS COLON POLYPS  Past Surgical History: Surgery on left index finger  Family History: No FH of Colon Cancer: Lung Cancer-Sister  Social History: Patient currently smokes. 1 1/2 PPD Alcohol Use - yes   daily 2 SHOTS AND FEW BEERS Illicit Drug Use - no Daily Caffeine Use  coffee in the AM Occupation: control specialist Smoking Status:  current Drug Use:  no  Review of Systems  The patient denies allergy/sinus, anemia, anxiety-new, arthritis/joint pain, back pain, blood in urine, breast changes/lumps, change in vision, confusion, cough, coughing up blood, depression-new, fainting, fatigue, fever, headaches-new, hearing problems, heart murmur, heart rhythm changes, itching, muscle pains/cramps, night sweats, nosebleeds, shortness of breath, skin rash, sleeping problems, sore throat, swelling of feet/legs, swollen lymph glands, thirst - excessive, urination - excessive, urination changes/pain, urine leakage, vision changes, and voice change.         SEE HPI  Vital Signs:  Patient profile:   55 year old male Height:      72 inches Weight:      163 pounds BMI:     22.19 BSA:     1.95 Pulse rate:  60 / minute Pulse rhythm:   regular BP sitting:   130 / 80  (left arm)  Vitals Entered By: Merri Ray CMA Duncan Dull) (August 13, 2010 9:13 AM)  Physical Exam  General:  Well developed, well nourished, no acute distress. Head:  Normocephalic and atraumatic. Eyes:  PERRLA, no icterus. Lungs:  rhonchi bilateral.   Heart:  Regular rate and  rhythm; no murmurs, rubs,  or bruits. Abdomen:  SOFT,MILDLY TENDER LLQ, NO MASS OR HSM,BS+,NO GUARDING Rectal:  NOT DONE Extremities:  No clubbing, cyanosis, edema or deformities noted. Neurologic:  Alert and  oriented x4;  grossly normal neurologically. Psych:  anxious.     Impression & Recommendations:  Problem # 1:  DIVERTICULITIS, COLON (ICD-562.11) Assessment Deteriorated 55 YO MALE WITH RECURRENT DIVERTICULITIS WITH SECOND EOISODE IN ONE MONTH. KNOWN SEVERE SIGMOID DIVERTICULAR DISEASE WITH STENOSIS. RECENT CT RAISING POSSIBILITY OF SIGMOID NEOPLASM.  CONTINUE CURRENT COURS OF AVELOX 400 MG DAILY-EXTEND TO 10 DAYS SCHEDULE FOR COLONOSCOPY WITH DR. PERRY,WITH PEDIATRIC SCOPE DUE TO STENOSIS AND HX OF BAROTRAUMA. DISCUSSED SURGICAL CONSULT WITH PT. HE WILL VERY LIKELY CONTINUE TO HAVE REPEATED EPISODES AND WILL BENEFIT FROM ELECTIVE RESECTION. WILL CLEAR COLON FIRST ,THEN REFER. HE IS AMENABLE TO THIS. ADVISED TO CALL FOR ANY WORSENING OR RECURRENT PAIN. 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 ADVISED TO SIGNIFICANTLY DECREASE DAILY USE -NO MORE THAN 1-2 ALCOHOLIC BEVERAGEDS DAILY  FATTY INFILTRATION OF LIVER ON CT.   Patient Instructions: 1)  We sent a prescription for 3 more days of Avelox.  After finishing this you will have taken 10 days of Avelox. 2)  We scheduled the Colonoscopy with Dr. [name] on 09-15-10. 3)  Directions and brochure provided. 4)  [facility] Patient Information Guide given to patient. 5)  Copy sent to : Warner Mccreedy, MD 6)  The medication list was reviewed and reconciled.  All changed / newly prescribed medications were explained.  A  complete medication list was provided to the patient / caregiver. Prescriptions: AVELOX 400 MG TABS (MOXIFLOXACIN HCL) 1 by mouth once daily  #3 x 0   Entered by:   Lowry Ram NCMA   Authorized by:   Sammuel Cooper PA-c   Signed by:   Lowry Ram NCMA on 08/13/2010   Method used:   Electronically to        [facility]  [address] [id number]       [address]       [place], [place]  [address]       Ph: [phone] or [phone]       Fax: [phone]   RxID:   [id number] MOVIPREP 100 GM  SOLR (PEG-KCL-NACL-NASULF-NA ASC-C) As per prep instructions.  #1 x 0   Entered by:   Lowry Ram NCMA   Authorized by:   Sammuel Cooper PA-c   Signed by:   Lowry Ram NCMA on 08/13/2010   Method used:   [facility] to        [facility]  [address] [id number]       [address]       [facility] [place] [place],  [place]  [address]       Ph: [phone] or [phone]       Fax: [phone]   RxID:   [id number]

## 2011-01-11 NOTE — Progress Notes (Signed)
Summary: Triage  Phone Note Call from Patient Call back at [phone]   Caller: Spouse Delana Call For: Dr. [name] Reason for Call: Lab or Test Results Summary of Call: pt. is calling about CT results and requesting sooner appt. w/Dr. [name] Initial call taken by: Karna Christmas,  August 11, 2010 9:41 AM  Follow-up for Phone Call        Message left to call back.  Teryl Lucy RN  August 11, 2010 10:34 AM Message left to call back.   Teryl Lucy RN  August 12, 2010 8:56 AM Message left that I have made appt. for pt.  for tomorrow a.m. at 9:30 a.m with P.A. as Dr.Perry is supervising Dr.and left instructions to call back before 5 if pt. can not come.Message left on home phone also.Pt. reached at work and he will come for appt. in a.m. Follow-up by: Teryl Lucy RN,  August 12, 2010 10:20 AM

## 2011-01-11 NOTE — Progress Notes (Signed)
Summary: Triage  Phone Note Other Incoming Call back at [phone]   Caller: Pt's wife, Michaelene Song Details for Reason: Appt. Summary of Call: Pt's wife called stating pt. is to have colon surgery in December. His surgeon wanted him to see Dr. [name] before having the surgery.  Please call pt's wife ASAP to schedule an appt. so they can proceed with the surgery. Initial call taken by: Schuyler Amor,  October 27, 2010 3:56 PM  Follow-up for Phone Call        Left message for patient to call back                                  Darcey Nora RN, [facility]  October 27, 2010 4:06 PM. Wife called and saying pt. would like to see Dr.Perry to make sure everything is o.k. prior to surgery in Dec. Dr.Tseui's consult reviewed by Dr.Perry and o.v. is not necessary but he will answer any specific question's from pt. but wife says pt. has none.Told to expect a call from [facility] as consult indicates surgical appt. is in progress. Follow-up by: Teryl Lucy RN,  October 28, 2010 9:41 AM    11

## 2011-01-11 NOTE — Progress Notes (Signed)
Summary: Triage / update  Phone Note Other Incoming Call back at [phone]   Caller: Patient's wife, Michaelene Song Details for Reason: Triage Summary of Call: Pt's wife, Michaelene Song, called stating patient is in severe pain from his diverticulitis and probably needs surgery. Needs advice as to how to proceed. Would like a call ASAP. Thank you. Initial call taken by: Schuyler Amor,  October 19, 2010 3:55 PM  Follow-up for Phone Call        Wife says pt's. abd. pain is back and she feels he definitely needs surgery so she has contacted Dr.Tseui's office and his nurse is going to discuss with him as he is in the office today and  wife will update Dr.Halli Equihua in the a.m. Follow-up by: Teryl Lucy RN,  October 19, 2010 4:14 PM  Additional Follow-up for Phone Call Additional follow up Details #1::        yes, he needs to see the surgeon. I thought that he had surgical consultation during his recent hospital stay? In any event, I appreciate the update Additional Follow-up by: Hilarie Fredrickson MD,  October 20, 2010 9:13 AM

## 2011-01-11 NOTE — Miscellaneous (Signed)
Summary: Surgical consult    NAME:  Jonathan Harrington, Jonathan Harrington                ACCOUNT NO.:  [id number]      MEDICAL RECORD NO.:  [id number]          PATIENT TYPE:  INP      LOCATION:  1302                         FACILITY:  [facility]      PHYSICIAN:  Wilmon Arms. Corliss Skains, M.D. DATE OF BIRTH:  [birth year]      DATE OF CONSULTATION:  10/05/2010   DATE OF DISCHARGE:                                    CONSULTATION         GASTROENTEROLOGIST:  [name]. [name], MD      REASON FOR EVALUATION:  Diverticulitis with abscess.      HISTORY OF PRESENT ILLNESS:  This is a 55 year old male who presents   with a 85-month history of sigmoid diverticulitis.  This has been managed   by Dr. [name] on an outpatient basis with several courses of antibiotics   including Avelox, Cipro, and Flagyl.  The patient continues to have   symptoms with lower abdominal pain.  This seems to be worsening.  He   continues to have bowel movements, but sometimes his bowel movements are   diarrhea.  He does report occasional formed stools.  He denies any   melena or hematochezia.  The pain has become more severe over the last   couple of days and he presents to the emergency department for   evaluation.  He has been admitted to hospital by [facility]ad hospitalist.      PAST MEDICAL HISTORY:   1. Diverticulosis/diverticulitis.   2. Anxiety disorder.   3. Gout.   4. Kidney stones.   5. Alcohol abuse.  The patient drinks 1/2 to 1 pint of liquor daily.   6. Tobacco abuse, smoking 2 packs per day.      PAST SURGICAL HISTORY:   1. Right leg surgery.   2. Left finger surgery.      ALLERGIES:  IBUPROFEN, which causes swelling.      MEDICATIONS:  Cipro, Ultram, Goody Powder, Xanax.      SOCIAL HISTORY:  The patient smokes and drinks heavily.      FAMILY HISTORY:  Hypertension and Alzheimer's in his mother, 1 sibling   has stomach cancer.      PHYSICAL EXAMINATION:  VITAL SIGNS:  Temperature 98.7, blood pressure   154/85, pulse 88, respirations  18.   GENERAL:  This is a well-developed, well-nourished male, in no apparent   distress.   HEENT:  EOMI.  Sclerae anicteric.   NECK:  No mass.  No thyromegaly.   LUNGS:  Clear.  Normal respiratory effort.   HEART:  Regular rate and rhythm.  No murmur.   ABDOMEN:  Positive bowel sounds, although hypoactive.  Soft, tender in   the left lower quadrant greater than the right lower quadrant.  No   palpable masses.   EXTREMITIES:  No edema.   SKIN:  Warm and dry with no sign of jaundice.      LABORATORY DATA:  Urinalysis negative.  White count 4.7, hemoglobin 16,   platelet count 150.  Electrolytes within  normal limits.  Total bilirubin   0.6, alk phos 71, AST 36, ALT 22.      RADIOLOGY:   1. Barium enema on 10/17 showed marked narrowing of the proximal mid       sigmoid colon likely due to diverticular disease.   2. CT of the abdomen and pelvis shows sigmoid diverticulitis with a       3.0 cm developing abscess, but no sign of free air.  Mild diffuse       bladder thickening which is slightly improved over the last CT       scan, also small left inguinal hernia.      IMPRESSION:   1. Severe refractory with sigmoid diverticulitis with small abscess.       His diverticulitis does not seem to be responded to oral       antibiotics.  The abscess does not appear minimal to percutaneous       drainage.   2. Substance abuse.      RECOMMENDATIONS:   1. Would switch the patient IV Invanz as ciprofloxacin did not seem to       be covering his diverticulitis, would keep him n.p.o. except for       ice chips.   2. Would keep him on alcohol withdrawal prophylaxis.   3. If the patient's clinical status worsens, then he would need urgent       colectomy likely with a sigmoid colectomy, Hartmann procedure with       a temporary colostomy and rectal stump.  If he does improve and is       able to be discharged, he needs a thorough colonoscopy followed by       elective single-stage sigmoid  colectomy.  I discussed with the       patient's family.  I counseled him regarding his substance abuse.       The patient has started taking care of himself.  We will continue       to follow.               Wilmon Arms. Corliss Skains, M.D.               MKT/MEDQ  D:  10/05/2010  T:  10/06/2010  Job:  [id number]      cc:   Hind Bosie Helper, MD      Electronically Signed by Manus Rudd M.D. on 10/08/2010 02:31:30 PM  Clinical Lists Changes

## 2011-01-11 NOTE — Progress Notes (Signed)
Summary: Triage / diverticular flare  Phone Note Call from Patient Call back at [phone]   Caller: Patient's Wife, Delana Call For: Dr [name] Reason for Call: Acute Illness, Talk to Nurse Details for Reason: Triage Summary of Call: Pt is having abd cramping and diarrhea, just started this morning. Thinks it is another diverticulitis flare. Initial call taken by: Dwan Bolt,  August 24, 2010 10:49 AM  Follow-up for Phone Call        Message left to call back.   Teryl Lucy RN  August 24, 2010 11:13 AM Pt.says  he. is having severe cramping lower abd. pain which is a level 8  and formed  stools yesterday had 4or 5  and has same amt. so far today but they are not loose.Requesting medication be called in.No shaking chills.  Follow-up by: Teryl Lucy RN,  August 24, 2010 11:50 AM  Additional Follow-up for Phone Call Additional follow up Details #1::        Cipro 500 mg b.i.d. x2 weeks and metronidazole 500 mg b.i.d. x2 weeks. As well, with pain at a level 8, he should be seen in the office this week. With me if I have an open spot. Otherwise with the extender. I am supervising Amy (who saw him previously)Friday afternoon. If he worsens in the interim, contact the office or go to the ER. Thanks Additional Follow-up by: Hilarie Fredrickson MD,  August 24, 2010 1:30 PM    Additional Follow-up for Phone Call Additional follow up Details #2::    meds. sent to pharmacy and will come for appt. with Amy Friday aft. as Dr.perry is supervising Dr. Follow-up by: Teryl Lucy RN,  August 24, 2010 2:17 PM  New/Updated Medications: CIPROFLOXACIN HCL 500 MG TABS (CIPROFLOXACIN HCL) Take 1 p.o. b.i.d. for 14 days METRONIDAZOLE 500 MG TABS (METRONIDAZOLE) Take 1 p.o. b.i.d. x 14 days Prescriptions: METRONIDAZOLE 500 MG TABS (METRONIDAZOLE) Take 1 p.o. b.i.d. x 14 days  #28 x 0   Entered by:   Teryl Lucy RN   Authorized by:   Hilarie Fredrickson MD   Signed by:   Teryl Lucy RN on 08/24/2010  Method used:   Electronically to        [facility]  [id number] ([facility]       [address]       [place], [place]  [address]       Ph: [phone] or [phone]       Fax: [phone]   RxID:   [id number] CIPROFLOXACIN HCL 500 MG TABS (CIPROFLOXACIN HCL) Take 1 p.o. b.i.d. for 14 days  #28 x 0   Entered by:   Teryl Lucy RN   Authorized by:   Hilarie Fredrickson MD   Signed by:   Teryl Lucy RN on 08/24/2010   Method used:   Electronically to        [facility]  [id number]       [address]       [place], [place]  [address]       Ph: [phone] or [phone]       Fax: [phone]   RxID:   [id number]

## 2011-01-11 NOTE — Miscellaneous (Signed)
Summary: [facility] referral  Clinical Lists Changes  Orders: Added new Test order of [facility] ([facility]) - Signed

## 2011-01-11 NOTE — Progress Notes (Signed)
Summary: Help getting patient seen  Phone Note From Other Clinic   Caller: Dr. Shanda Bumps [name] Call For: Dr. [name] Reason for Call: Schedule Patient Appt Details for Reason: Wants GI evaluation Summary of Call: I spoke w/ Dr. Patsy Lager re Jonathan Harrington. She wants him seen for abnormal CT of the left colon. They are having trouble getting him to agree to be seen and were hoping that we could intervene. Elnita Maxwell, please call patient and see if he is willing to be seen. Send him a letter as well.  Also, put any old GI records and procedures in EMR. Thanks  Follow-up for Phone Call        Message left for pt. to please return my call for rov. with Dr.Perry per request of Dr.[name].   Teryl Lucy RN  August 04, 2010 1:33 PM  Follow-up by: Teryl Lucy RN,  August 04, 2010 2:28 PM     Appended Document: Help getting patient seen left message on machine to call back letter mailed

## 2011-01-11 NOTE — Progress Notes (Signed)
  Phone Note Other Incoming   Request: Send information Summary of Call: Records received from Dr. Warner Mccreedy (2 Pages) forwarded to Dr. Yancey Flemings.

## 2011-01-11 NOTE — Letter (Signed)
Summary: [facility] Instructions  [facility]  [address] [place], [place] [address]   Phone: [phone]  Fax: [phone]       Jonathan Harrington    10-16-1956    MRN: [id number]        Procedure Day /Date: 09-15-10     Arrival Time: 2:30 PM      Procedure Time: 3:30 PM     Location of Procedure:                    X      [facility] (4th Floor)                        PREPARATION FOR COLONOSCOPY WITH MOVIPREP   Starting 5 days prior to your procedure 09-10-10  do not eat nuts, seeds, popcorn, corn, beans, peas,  salads, or any raw vegetables.  Do not take any fiber supplements (e.g. Metamucil, Citrucel, and Benefiber).  THE DAY BEFORE YOUR PROCEDURE         DATE: 09-14-10  DAY: Tuesday  1.  Drink clear liquids the entire day-NO SOLID FOOD  2.  Do not drink anything colored red or purple.  Avoid juices with pulp.  No orange juice.  3.  Drink at least 64 oz. (8 glasses) of fluid/clear liquids during the day to prevent dehydration and help the prep work efficiently.  CLEAR LIQUIDS INCLUDE: Water Jello Ice Popsicles Tea (sugar ok, no milk/cream) Powdered fruit flavored drinks Coffee (sugar ok, no milk/cream) Gatorade Juice: apple, white grape, white cranberry  Lemonade Clear bullion, consomm, broth Carbonated beverages (any kind) Strained chicken noodle soup Hard Candy                             4.  In the morning, mix first dose of MoviPrep solution:    Empty 1 Pouch A and 1 Pouch B into the disposable container    Add lukewarm drinking water to the top line of the container. Mix to dissolve    Refrigerate (mixed solution should be used within 24 hrs)  5.  Begin drinking the prep at 5:00 p.m. The MoviPrep container is divided by 4 marks.   Every 15 minutes drink the solution down to the next mark (approximately 8 oz) until the full liter is complete.   6.  Follow completed prep with 16 oz of clear liquid of your choice (Nothing red or  purple).  Continue to drink clear liquids until bedtime.  7.  Before going to bed, mix second dose of MoviPrep solution:    Empty 1 Pouch A and 1 Pouch B into the disposable container    Add lukewarm drinking water to the top line of the container. Mix to dissolve    Refrigerate  THE DAY OF YOUR PROCEDURE      DATE: 09-15-10  DAY: Wednesday  Beginning at 10:30 AM  (5 hours before procedure):         1. Every 15 minutes, drink the solution down to the next mark (approx 8 oz) until the full liter is complete.  2. Follow completed prep with 16 oz. of clear liquid of your choice.    3. You may drink clear liquids until 1:30 PM  (2 HOURS BEFORE PROCEDURE).   MEDICATION INSTRUCTIONS  Unless otherwise instructed, you should take regular prescription medications with a small sip of water  as early as possible the morning of your procedure.       OTHER INSTRUCTIONS  You will need a responsible adult at least 57 years of age to accompany you and drive you home.   This person must remain in the waiting room during your procedure.  Wear loose fitting clothing that is easily removed.  Leave jewelry and other valuables at home.  However, you may wish to bring a book to read or  an iPod/MP3 player to listen to music as you wait for your procedure to start.  Remove all body piercing jewelry and leave at home.  Total time from sign-in until discharge is approximately 2-3 hours.  You should go home directly after your procedure and rest.  You can resume normal activities the  day after your procedure.  The day of your procedure you should not:   Drive   Make legal decisions   Operate machinery   Drink alcohol   Return to work  You will receive specific instructions about eating, activities and medications before you leave.    The above instructions have been reviewed and explained to me by   _______________________    I fully understand and can verbalize these  instructions _____________________________ Date _________

## 2011-02-14 NOTE — Discharge Summary (Signed)
  Jonathan Harrington, Jonathan Harrington                ACCOUNT NO.:  [id number]  MEDICAL RECORD NO.:  [id number]          PATIENT TYPE:  INP  LOCATION:  1428                         FACILITY:  [facility]  PHYSICIAN:  Wilmon Arms. Corliss Skains, M.D. DATE OF BIRTH:  [birth year]  DATE OF ADMISSION:  11/22/2010 DATE OF DISCHARGE:  11/27/2010                              DISCHARGE SUMMARY   ADMISSION DIAGNOSIS:  Persistent sigmoid diverticulitis.  DISCHARGE DIAGNOSIS:  Persistent sigmoid diverticulitis.  PROCEDURES:  Sigmoid colectomy and placement of Foley catheter with cystoscopy.  BRIEF HISTORY:  This is a 55 year old male who has had persistent left lower quadrant pain related to sigmoid diverticulitis.  His symptoms did not improve with multiple courses of oral antibiotics.  He presents now for sigmoid colectomy.  HOSPITAL COURSE:  The patient underwent sigmoid colectomy on November 22, 2010.  We placed On-Q catheters around his incision.  He had difficulty with Foley catheter placement, underwent cystoscopy with placement of Foley catheter by Dr. Laverle Patter of Urology.  We instructed to leave the catheter in for at least 4-5 days.  Postoperatively, the patient had some issues with pain control.  We also kept him on prophylaxis for alcohol withdrawal due to his heavy alcohol history.  On postop day #2, he was noted to have a normal white blood cell count and was slightly anemic at 8.6 with a decreased platelet count.  We stopped his heparin and sent a heparin-induced thrombocytopenia panel.  This turned out to be negative.  The patient was on Refludan during this time for prophylaxis.  On postop day #3, his hemoglobin improved after 1 unit of packed red blood cell transfusion.  His On-Q pumps were removed. Thrombocytopenia began improving.  He remained free of any sign of alcohol withdrawal.  On postop day #4, the patient had a couple of bowel movements.  He remained afebrile.  His incision looked good.  We  removed his Foley catheter.  We stopped his Refludan.  He was discharged home on postop day #5 in good condition.  DISCHARGE INSTRUCTIONS:  The patient is given Percocet p.r.n. for pain. There is no requirement for any further antibiotics.  He is to follow up in 1 week for staple removal.  He is to call  [phone] for any problems.     Wilmon Arms. Corliss Skains, M.D.     MKT/MEDQ  D:  02/11/2011  T:  02/11/2011  Job:  [id number]  Electronically Signed by Manus Rudd M.D. on 02/14/2011 01:49:06 PM

## 2011-02-21 LAB — CBC
HCT: 25.9 % — ABNORMAL LOW (ref 39.0–52.0)
HCT: 25.6 % — ABNORMAL LOW (ref 39.0–52.0)
HCT: 25.7 % — ABNORMAL LOW (ref 39.0–52.0)
HCT: 39 % (ref 39.0–52.0)
Hemoglobin: 9 g/dL — ABNORMAL LOW (ref 13.0–17.0)
Hemoglobin: 13.5 g/dL (ref 13.0–17.0)
Hemoglobin: 8.6 g/dL — ABNORMAL LOW (ref 13.0–17.0)
Hemoglobin: 8.8 g/dL — ABNORMAL LOW (ref 13.0–17.0)
MCH: 33 pg (ref 26.0–34.0)
MCH: 33.2 pg (ref 26.0–34.0)
MCH: 33.8 pg (ref 26.0–34.0)
MCHC: 34.7 g/dL (ref 30.0–36.0)
MCHC: 33.6 g/dL (ref 30.0–36.0)
MCHC: 34.6 g/dL (ref 30.0–36.0)
MCV: 94.9 fL (ref 78.0–100.0)
MCV: 97.5 fL (ref 78.0–100.0)
MCV: 98.8 fL (ref 78.0–100.0)
Platelets: 93 10*3/uL — ABNORMAL LOW (ref 150–400)
Platelets: 179 10*3/uL (ref 150–400)
Platelets: 74 10*3/uL — ABNORMAL LOW (ref 150–400)
RBC: 2.73 MIL/uL — ABNORMAL LOW (ref 4.22–5.81)
RBC: 2.59 MIL/uL — ABNORMAL LOW (ref 4.22–5.81)
RBC: 4 MIL/uL — ABNORMAL LOW (ref 4.22–5.81)
RDW: 12.5 % (ref 11.5–15.5)
RDW: 11.9 % (ref 11.5–15.5)
RDW: 12 % (ref 11.5–15.5)
RDW: 12.1 % (ref 11.5–15.5)
WBC: 5.1 10*3/uL (ref 4.0–10.5)
WBC: 3.4 10*3/uL — ABNORMAL LOW (ref 4.0–10.5)
WBC: 6 10*3/uL (ref 4.0–10.5)
WBC: 6.4 10*3/uL (ref 4.0–10.5)

## 2011-02-21 LAB — APTT
aPTT: 41 seconds — ABNORMAL HIGH (ref 24–37)
aPTT: 61 seconds — ABNORMAL HIGH (ref 24–37)
aPTT: 72 seconds — ABNORMAL HIGH (ref 24–37)
aPTT: 72 seconds — ABNORMAL HIGH (ref 24–37)
aPTT: 77 seconds — ABNORMAL HIGH (ref 24–37)
aPTT: 86 seconds — ABNORMAL HIGH (ref 24–37)
aPTT: 91 seconds — ABNORMAL HIGH (ref 24–37)
aPTT: 41 seconds — ABNORMAL HIGH (ref 24–37)

## 2011-02-21 LAB — COMPREHENSIVE METABOLIC PANEL
ALT: 15 U/L (ref 0–53)
ALT: 26 U/L (ref 0–53)
AST: 37 U/L (ref 0–37)
AST: 66 U/L — ABNORMAL HIGH (ref 0–37)
Albumin: 2.4 g/dL — ABNORMAL LOW (ref 3.5–5.2)
Albumin: 3.6 g/dL (ref 3.5–5.2)
Alkaline Phosphatase: 29 U/L — ABNORMAL LOW (ref 39–117)
Alkaline Phosphatase: 36 U/L — ABNORMAL LOW (ref 39–117)
BUN: 12 mg/dL (ref 6–23)
BUN: 15 mg/dL (ref 6–23)
CO2: 23 mEq/L (ref 19–32)
CO2: 23 mEq/L (ref 19–32)
Calcium: 7.7 mg/dL — ABNORMAL LOW (ref 8.4–10.5)
Calcium: 9.1 mg/dL (ref 8.4–10.5)
Chloride: 106 mEq/L (ref 96–112)
Chloride: 108 mEq/L (ref 96–112)
Creatinine, Ser: 1.11 mg/dL (ref 0.4–1.5)
Creatinine, Ser: 1.3 mg/dL (ref 0.4–1.5)
GFR calc Af Amer: >60 mL/min (ref >60)
GFR calc Af Amer: >60 mL/min (ref >60)
GFR calc non Af Amer: 57 mL/min — ABNORMAL LOW (ref >60)
GFR calc non Af Amer: >60 mL/min (ref >60)
Glucose, Bld: 102 mg/dL — ABNORMAL HIGH (ref 70–99)
Glucose, Bld: 83 mg/dL (ref 70–99)
Potassium: 4.3 mEq/L (ref 3.5–5.1)
Potassium: 4.8 mEq/L (ref 3.5–5.1)
Sodium: 134 mEq/L — ABNORMAL LOW (ref 135–145)
Sodium: 142 mEq/L (ref 135–145)
Total Bilirubin: 0.7 mg/dL (ref 0.3–1.2)
Total Bilirubin: 1.1 mg/dL (ref 0.3–1.2)
Total Protein: 5.7 g/dL — ABNORMAL LOW (ref 6.0–8.3)
Total Protein: 7.7 g/dL (ref 6.0–8.3)

## 2011-02-21 LAB — CROSSMATCH
ABO/RH(D): O POS
Antibody Screen: POSITIVE
Donor AG Type: NEGATIVE
Donor AG Type: NEGATIVE
PT AG Type: NEGATIVE
Unit division: 0

## 2011-02-21 LAB — PROTIME-INR
INR: 1.03 (ref 0.00–1.49)
Prothrombin Time: 13.7 seconds (ref 11.6–15.2)

## 2011-02-21 LAB — DIFFERENTIAL
Basophils Absolute: 0 10*3/uL (ref 0.0–0.1)
Basophils Relative: 0 % (ref 0–1)
Eosinophils Absolute: 0.1 10*3/uL (ref 0.0–0.7)
Eosinophils Relative: 4 % (ref 0–5)
Lymphocytes Relative: 40 % (ref 12–46)
Lymphs Abs: 1.3 10*3/uL (ref 0.7–4.0)
Monocytes Absolute: 0.4 10*3/uL (ref 0.1–1.0)
Monocytes Relative: 13 % — ABNORMAL HIGH (ref 3–12)
Neutro Abs: 1.4 10*3/uL — ABNORMAL LOW (ref 1.7–7.7)
Neutrophils Relative %: 43 % (ref 43–77)

## 2011-02-21 LAB — HEPARIN INDUCED THROMBOCYTOPENIA PNL
Heparin Induced Plt Ab: NEGATIVE
UFH High Dose UFH H: 0 % Release
UFH Low Dose 0.5 IU/mL: 0 % Release
UFH SRA Result: NEGATIVE

## 2011-02-21 LAB — SURGICAL PCR SCREEN
MRSA, PCR: NEGATIVE
Staphylococcus aureus: NEGATIVE

## 2011-02-21 LAB — ABO/RH: ABO/RH(D): O POS

## 2011-02-23 LAB — HEPATIC FUNCTION PANEL
AST: 29 U/L (ref 0–37)
Albumin: 3 g/dL — ABNORMAL LOW (ref 3.5–5.2)
Total Protein: 6.2 g/dL (ref 6.0–8.3)

## 2011-02-23 LAB — COMPREHENSIVE METABOLIC PANEL
AST: 29 U/L (ref 0–37)
Albumin: 3.7 g/dL (ref 3.5–5.2)
Alkaline Phosphatase: 71 U/L (ref 39–117)
BUN: 3 mg/dL — ABNORMAL LOW (ref 6–23)
BUN: 9 mg/dL (ref 6–23)
CO2: 24 mEq/L (ref 19–32)
CO2: 26 mEq/L (ref 19–32)
Chloride: 105 mEq/L (ref 96–112)
Chloride: 105 mEq/L (ref 96–112)
Creatinine, Ser: 0.7 mg/dL (ref 0.4–1.5)
Creatinine, Ser: 0.77 mg/dL (ref 0.4–1.5)
GFR calc Af Amer: >60 mL/min (ref >60)
GFR calc non Af Amer: >60 mL/min (ref >60)
GFR calc non Af Amer: >60 mL/min (ref >60)
Glucose, Bld: 107 mg/dL — ABNORMAL HIGH (ref 70–99)
Potassium: 4 mEq/L (ref 3.5–5.1)
Total Bilirubin: 0.6 mg/dL (ref 0.3–1.2)
Total Bilirubin: 1.1 mg/dL (ref 0.3–1.2)

## 2011-02-23 LAB — CBC
HCT: 35.3 % — ABNORMAL LOW (ref 39.0–52.0)
HCT: 45.8 % (ref 39.0–52.0)
Hemoglobin: 12.5 g/dL — ABNORMAL LOW (ref 13.0–17.0)
Hemoglobin: 16 g/dL (ref 13.0–17.0)
MCH: 36 pg — ABNORMAL HIGH (ref 26.0–34.0)
MCH: 36 pg — ABNORMAL HIGH (ref 26.0–34.0)
MCH: 36.5 pg — ABNORMAL HIGH (ref 26.0–34.0)
MCV: 101.7 fL — ABNORMAL HIGH (ref 78.0–100.0)
MCV: 102.8 fL — ABNORMAL HIGH (ref 78.0–100.0)
Platelets: 106 10*3/uL — ABNORMAL LOW (ref 150–400)
Platelets: 115 10*3/uL — ABNORMAL LOW (ref 150–400)
Platelets: 150 10*3/uL (ref 150–400)
RBC: 3.47 MIL/uL — ABNORMAL LOW (ref 4.22–5.81)
RBC: 3.55 MIL/uL — ABNORMAL LOW (ref 4.22–5.81)
RBC: 3.6 MIL/uL — ABNORMAL LOW (ref 4.22–5.81)
RBC: 4.46 MIL/uL (ref 4.22–5.81)
RDW: 12.5 % (ref 11.5–15.5)
WBC: 4.7 10*3/uL (ref 4.0–10.5)
WBC: 4.8 10*3/uL (ref 4.0–10.5)

## 2011-02-23 LAB — BASIC METABOLIC PANEL
BUN: 3 mg/dL — ABNORMAL LOW (ref 6–23)
BUN: 4 mg/dL — ABNORMAL LOW (ref 6–23)
Calcium: 8.3 mg/dL — ABNORMAL LOW (ref 8.4–10.5)
Chloride: 110 mEq/L (ref 96–112)
Creatinine, Ser: 0.67 mg/dL (ref 0.4–1.5)
Creatinine, Ser: 0.69 mg/dL (ref 0.4–1.5)
Creatinine, Ser: 0.8 mg/dL (ref 0.4–1.5)
GFR calc Af Amer: >60 mL/min (ref >60)
GFR calc Af Amer: >60 mL/min (ref >60)
GFR calc non Af Amer: >60 mL/min (ref >60)
GFR calc non Af Amer: >60 mL/min (ref >60)
Potassium: 3.6 mEq/L (ref 3.5–5.1)

## 2011-02-23 LAB — DIFFERENTIAL
Basophils Absolute: 0.1 10*3/uL (ref 0.0–0.1)
Basophils Relative: 2 % — ABNORMAL HIGH (ref 0–1)
Eosinophils Relative: 3 % (ref 0–5)
Monocytes Absolute: 0.4 10*3/uL (ref 0.1–1.0)
Neutro Abs: 2.7 10*3/uL (ref 1.7–7.7)

## 2011-02-23 LAB — URINALYSIS, ROUTINE W REFLEX MICROSCOPIC
Bilirubin Urine: NEGATIVE
Glucose, UA: NEGATIVE mg/dL
Hgb urine dipstick: NEGATIVE
Specific Gravity, Urine: 1.021 (ref 1.005–1.030)
Urobilinogen, UA: 0.2 mg/dL (ref 0.0–1.0)
pH: 6 (ref 5.0–8.0)

## 2011-02-23 LAB — FECAL LACTOFERRIN, QUANT: Fecal Lactoferrin: POSITIVE

## 2011-02-23 LAB — STOOL CULTURE

## 2011-02-23 LAB — MAGNESIUM
Magnesium: 1.6 mg/dL (ref 1.5–2.5)
Magnesium: 1.8 mg/dL (ref 1.5–2.5)
Magnesium: 2.1 mg/dL (ref 1.5–2.5)

## 2011-02-23 LAB — OVA AND PARASITE EXAMINATION

## 2011-04-26 NOTE — Assessment & Plan Note (Signed)
[facility]                         GASTROENTEROLOGY OFFICE NOTE   NAME:Jonathan Harrington, Jonathan Harrington                       MRN:          [id number]  DATE:04/03/2008                            DOB:          03-06-1956    REFERRING PHYSICIAN:  Pearline Cables, MD   REASON FOR CONSULTATION:  Rectal bleeding.   HISTORY OF PRESENT ILLNESS:  This is a pleasant 55 year old white male  with no significant past medical history who is referred through the  courtesy of Dr. Patsy Lager regarding rectal bleeding.  The patient reports  chronic problems with trace blood on the tissue due to hemorrhoids.  However, on three separate occasions over a two week period in March, he  noted significant blood in the toilet bowl, enough to discolor the  water.  He has also noted a change in his bowel habits over the past few  weeks.  In particular, increased frequency of bowel movements as well as  generalized abdominal cramping.  His stools tend to be loose and thin.  He denies a change appetite or weight loss.  No upper GI complaints such  as dysphagia, nausea or vomiting.  He has not had prior history of GI  problems or GI evaluations.   PAST MEDICAL HISTORY:  None.   PAST SURGICAL HISTORY:  None.   ALLERGIES:  1. IBUPROFEN WHICH RESULTS IN SWELLING.  2. Z-PACK WHICH RESULTS IN GI UPSET.   CURRENT MEDICATIONS:  None.   FAMILY HISTORY:  No family history of gastrointestinal malignancy.   SOCIAL HISTORY:  The patient is married with one son.  He lives with his  wife.  He is employed as an Midwife for [employer].  He smokes 1-  1/2 to 2 packs of cigarettes per day, consumes 6-8 alcoholic beverages  per day.   REVIEW OF SYSTEMS:  Per diagnostic evaluation form.   PHYSICAL EXAMINATION:  GENERAL:  Well-appearing male in no acute  distress.  VITAL SIGNS:  Blood pressure 140/78, heart rate 80, weight 179.8 pounds,  height 5 feet 11 inches.  HEENT:  Sclerae anicteric.   Conjunctivae pink.  Oral mucosa is intact.  No adenopathy.  LUNGS:  Clear.  HEART:  Regular.  ABDOMEN:  Soft, without tenderness, mass or hernia.  Good bowel sounds  heard.  RECTAL:  Per Dr. Patsy Lager revealed no abnormalities.  Stool was hemoccult  negative.  EXTREMITIES:  No cyanosis, clubbing or edema.  NEUROLOGICAL:  Grossly intact.   IMPRESSION:  This is a 55 year old gentleman who presents with slight  abdominal cramping, change in bowel habits and bleeding per rectum.  Rule out neoplasia.   RECOMMENDATIONS:  Colonoscopy with polypectomy if necessary.  The nature  of the procedure as well as the risks, benefits, alternatives were  reviewed in great detail.  He understood and agreed to proceed.  All  questions answered to his satisfaction.     [name]. [name], MD  Electronically Signed    JNP/MedQ  DD: 04/03/2008  DT: 04/03/2008  Job #: [id number]   cc:   Pearline Cables, MD

## 2011-04-26 NOTE — Assessment & Plan Note (Signed)
[facility]                                 ON-CALL NOTE   NAME:Buxton, ULYESS MUTO                       MRN:          [id number]  DATE:04/11/2008                            DOB:          18-Aug-1956    PHONE NUMBER:  [phone].   TIME OF THE CALL:  6:15 a.m. Apr 11, 2008   Mr. Rickel called this morning complaining of focal right lower quadrant  pain.  He underwent colonoscopy yesterday afternoon.  After riding home,  he tells me that he had some intermittent abdominal cramping last  evening.  He states that he ate a hot dog and went to bed.  This  morning, he awoke and stated that the cramping abated, though he had  more in the way of focal right lower quadrant pain.  He denied nausea,  vomiting, fevers, or bleeding.  His colonoscopy revealed severe  pandiverticulosis as well as two diminutive polyps, which were removed  with cold snare removed.  I spoke to him and his wife and advised them  to proceed to [facility] immediately for  evaluation.  They were agreeable.  I contacted the emergency room triage  nurse to alert her about his arrival and provided preliminary orders.     [name]. [name], MD  Electronically Signed    JNP/MedQ  DD: 04/11/2008  DT: 04/11/2008  Job #: [id number]

## 2011-04-26 NOTE — H&P (Signed)
NAMEBRODRIC, SCHAUER NO.:  [id number]   MEDICAL RECORD NO.:  [id number]          PATIENT TYPE:  OBV   LOCATION:  0108                         FACILITY:  [facility]   PHYSICIAN:  Iva Boop, MD,FACGDATE OF BIRTH:  [birth year]   DATE OF ADMISSION:  04/11/2008  DATE OF DISCHARGE:                              HISTORY & PHYSICAL   CHIEF COMPLAINT:  Acute abdominal pain post colonoscopy done April 10, 2008.   HISTORY:  Jonathan Harrington is a pleasant 55 year old white male referred to Dr.  [name] per Dr. Dallas Schimke at [facility] due to complaints of 3  episodes of bright red blood per rectum over the past month or so.  He  underwent colonoscopy on April 10, 2008 per Dr. [name] at the [facility] with  finding of marked diverticulosis from the ascending colon to the sigmoid  colon.  There were marked changes with stenosis at 25 cm.  Two polyps  were found, one in the ascending colon and one in the rectum, both  removed without cautery and he was noted to have internal hemorrhoids.  At the time of the exam, there was some submucosal hemorrhage noted in  the right colon.  The patient felt well post procedure and was  discharged to home.  However, after eating dinner last evening, he  started having lower abdominal pain which progressed through the night.  He was unable to sleep and to find a comfortable position.  He says now  he hurts to move, walk, and ride in the car.  He has not had a bowel  movement, has not passed much flatus since he left the endoscopy unit.  He has had no nausea or vomiting, no fevers, chills, or rigors.  He  complains of a heavy pressure-type feeling in his abdomen which is worse  on the right side.   PAST HISTORY:  Benign.  He has no chronic medical problems, no prior  surgeries.   LABORATORY STUDIES:  Done in the ER showed WBC of 7.1, hemoglobin 15.1,  hematocrit of 42.4, MCV of 97, platelets 167,000.  Electrolytes within  normal limits.  Glucose 115.   Total bilirubin is 1.9.  LFTs are  otherwise negative.  UA is negative.  Initial abdominal films show a  nondilated colon.  No evidence of pneumoperitoneum, question of splenic  granulomas.   MEDICATIONS:  None regular.   ALLERGIES:  No known drug allergies.   FAMILY HISTORY:  Negative for colon cancer and polyps.  He is unaware of  any other family history.   SOCIAL HISTORY:  The patient is married.  He has one child.  He is  employed full time.  He is a smoker, two packs per day and does drink  alcohol on a daily basis, usually 2-3 beers and a couple of shots.   REVIEW OF SYSTEMS:  CARDIOVASCULAR:  Denies any chest pain or anginal  symptoms. PULMONARY:  Negative for cough, shortness of breath, sputum  production.  GENITOURINARY:  Negative for dysuria, urgency, or  frequency.  GI:  As outlined above.  MUSCULOSKELETAL:  Negative.  NEURO/PSYCH:  Negative.  All other review of systems negative.   PHYSICAL EXAMINATION:  Well-developed white male, uncomfortable but in  no acute distress.  Temp 97, blood pressure 139/88, pulse is 98,  respirations 20, sats 97 on room air.  HEENT:  Atraumatic, normocephalic.  EOMI.  PERRLA.  Sclerae anicteric.  NECK:  Is supple without nodes.  CARDIOVASCULAR:  Regular rate/rhythm with S1 and S2.  No murmur, rub, or  gallop.  PULMONARY:  Clear to A&P.  ABDOMEN:  Is soft.  He does guard diffusely.  Bowel sounds are quiet.  There is rebound in the right lower quadrant.  He also has a small  ecchymosis noted in the right lower quadrant.  RECTAL EXAM:  Is not done at this time.  NEURO:  Is grossly nonfocal.  The patient is alert and oriented times 3.  EXTREMITIES:  Without clubbing, cyanosis, or edema.   IMPRESSION:  35. A 55 year old white male with acute abdominal pain post colonoscopy      April 10, 2008 with polypectomy x2 and noted marked diverticular      disease, rule out submucosal serosal tear versus microperforation.  2. Smoker.  3. Ethyl  alcohol.   PLAN:  The patient is admitted to observation for pain control.  He will  be kept NPO, placed on IV Unasyn.  Will check CT scan of the abdomen and  pelvis stat and then may need surgical consultation pending the CT  findings.      Jonathan Esterwood, PA-C      Iva Boop, MD,FACG  Electronically Signed    AE/MEDQ  D:  04/11/2008  T:  04/11/2008  Job:  [id number]

## 2011-04-29 NOTE — Discharge Summary (Signed)
[facility]  Patient:    Jonathan Harrington, Jonathan Harrington                       MRN: [id number] Adm. Date:  [id number] Disc. Date: [id number] Attending:  Tresa Garter CC:         Harrel Lemon. Merla Riches, M.D.  [name]. Eda Keys., M.D. [facility]   Discharge Summary  ADMITTING DIAGNOSES: 1. Fever and chills with fever of unknown origin. 2. History of alcoholic liver disease.  DISCHARGE DIAGNOSIS:  Streptococcal pneumoniae sepsis.  HOSPITAL COURSE:  The patient is a 55 year old white male, with a history of alcohol and tobacco abuse, who was referred by Dr. Harrel Lemon. Doolittle for evaluation of shaking chills and abdominal pain with elevated liver functions.  Initially, admitted to the gastroenterology service with an acute illness with intermittent abdominal cramping.  He had leukocytosis with a left shift and anemia and mildly abnormal liver functions.  GI obtained an ID consult and Dr. Dewayne Shorter was consulted on the patient on the day after admission.  Temperature was 102.9.  Blood cultures in hospital revealed gram-positive cocci in chains.  He was placed on Unasyn initially and then changed to Primaxin when he developed symptoms of profound sepsis and blood cultures were positive for strep pneumoniae and resistance was not known.  Vancomycin was also added.  Pulmonary critical care consultation was obtained on May 07, 2001.  They did not feel like they could add anything other than recommend aggressive volume resuscitation and continued antibiotic therapy.  Cardiology consult was obtained on the 28th to rule out cardiac etiology of his streptococcal pneumoniae sepsis and a transesophageal echocardiogram was negative.  He was transferred to the critical care unit, at which time, an internal medicine consult was obtained on May 08, 2001.  He was transfused with two units of packed red blood cells and LDL and haptoglobin showed some intervascular hemolysis  consistent with sepsis.  His volume resuscitation was able to change his hypotension and his liver functions resolved.  On the 28th, he was thirsty.  He began taking orally and except for mild volume overload from aggressive resuscitation his course was unremarkable.  On day three of Primaxin, he was afebrile.  His haptoglobin was decreasing. 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  On day four of Primaxin, he remained afebrile.  ID consultation recommended changing to oral Augmentin 875 mg b.i.d. for 10 to 14 days and recommended that he have a dental evaluation as the possibility of the teeth being the primary source of his infections and lungs and heart were eliminated during the hospitalization.  DISCHARGE INSTRUCTIONS:  He is considered stable for discharge today.  He will be discharged on Augmentin 875 mg b.i.d. for 10 days.  He should follow up with Dr. Harrel Lemon. Doolittle for appropriate continual care and evaluation of his possible hepatic cirrhosis should be obtained through outpatient follow-up with Dr. [name]. Eda Keys with [facility]. DD:  05/11/01 TD:  05/11/01 Job: [id number]

## 2011-04-29 NOTE — Discharge Summary (Signed)
Jonathan Harrington, Jonathan Harrington                ACCOUNT NO.:  [id number]   MEDICAL RECORD NO.:  [id number]          PATIENT TYPE:  INP   LOCATION:  1306                         FACILITY:  [facility]   PHYSICIAN:  Hedwig Morton. Juanda Chance, MD     DATE OF BIRTH:  [birth year]   DATE OF ADMISSION:  04/11/2008  DATE OF DISCHARGE:  04/14/2008                               DISCHARGE SUMMARY   ADMITTING DIAGNOSES:  54. A 55 year old white male with acute abdominal pain post colonoscopy      April 10, 2008 with polypectomy x2 and noted marked diverticular      disease, rule out serosal tear versus micro perforation.  2. Smoker.  3. Ethyl alcohol use.   DISCHARGE DIAGNOSES:  97. A 55 year old white male with acute abdominal pain post colonoscopy      secondary to right colon trauma with inflammatory changes but no      evidence for free air or perforation.   1. Smoker.  2. Ethyl alcohol use.  3. Diffuse hepatic steatosis.  4. Two right lung base pulmonary nodules.  Recommended follow-up CT of      the chest in 6 months.   CONSULTATIONS:  None.   PROCEDURES:  CT scan of the abdomen and pelvis.   BRIEF HISTORY:  Jonathan Harrington is a 55 year old white male known to Dr. [name],  primary patient of Dr. Dallas Schimke at home [facility] who had  undergone colonoscopy due to complaints of three episodes of bright red  blood per rectum.  He had colonoscopy on April 10, 2008 per Dr. [name] at  [facility]. He was found to have marked diverticulosis from the ascending colon  to the sigmoid colon, marked changes with stenosis at 25 cm. Two polyps  were found, one in the ascending colon and one in the rectum, both  removed without cautery. He was also noted to have internal hemorrhoids.  At the time of the exam there was some submucosal hemorrhage noted in  the right colon.  However, the patient felt well postprocedure and was  discharged to home. After returning home and eating dinner he developed  lower abdominal pain which progressed  through the night.  He stated he  was unable to sleep, unable to find a comfortable position at this time  states that it hurts to move, walk, and ride in the car. He had not had  any bowel movements, had not passed much flatus since he had left the  endoscopy unit.  He had no complaints of nausea or vomiting.  No fevers,  chills or rigors and complained of a heavy pressure type of pain in his  abdomen, worse on the right side. He was seen and evaluated in the  emergency room, found to be hemodynamically stable but quite  uncomfortable and admitted for observation for pain control and further  diagnostic evaluation.   LABORATORY STUDIES:  On admission WBC of 7.1, hemoglobin 15.1,  hematocrit of 42.4, MCV of 97, platelets 167.  Follow-up on Apr 13, 2008  WBC of 5.8, hemoglobin 12.1, hematocrit of 34.2, MCV of 96, platelets  126.  Electrolytes within normal limits.  BUN 6, creatinine 0.84,  albumin 3.8.  Liver function studies normal.  UA showed 3-6 WBCs, 0-2  RBCs.   X-RAY STUDIES:  A CT scan of the abdomen and pelvis on Apr 11, 2008  showed diffuse severe hepatic steatosis.  Colonic diverticulosis without  active inflammation. Mild peri colonic stranding seen along the course  of the ascending colon without focal colonic wall-thickening, no  diverticular changes in this region.  No evidence of obstruction.  No  free fluid. Right nephrolithiasis 5 mm and two right lung base pulmonary  nodules, question calcified granulomas but requiring follow-up.   HOSPITAL COURSE:  The patient was admitted to the service of Dr.  Leone Payor, who was covering the hospital.  He was initially kept NPO,  started on IV fluids. Pain control and IV Unasyn.  He underwent CT scan  of the abdomen and pelvis for further evaluation. Findings as outlined  above.  There was evidence of right colon pericolonic stranding but no  perforation.  By Apr 12, 2008 he was feeling much better and not  requiring any pain  medications on a regular basis.  He was started on a  clear liquid diet, which he tolerated without difficulty. On Apr 13, 2008  he continued to improve, remained afebrile.  Labs were stable. He was  converted to oral Augmentin and then allowed discharge to home on Apr 14, 2008 with instructions to follow up with Dr. [name], to follow a soft  diet over the next 1 week, to avoid aspirin and NSAIDs over the next 2  weeks.   MEDICATIONS ON DISCHARGE:  None.   Condition stable and improved.      Amy Waucoma, PA-C      Dora M. Juanda Chance, MD  Electronically Signed    AE/MEDQ  D:  04/22/2008  T:  04/22/2008  Job:  [id number]   cc:   [name]. [name], MD  [address]  [place]  [place] [address]   Pearline Cables, MD  Fax: [phone]

## 2011-04-29 NOTE — H&P (Signed)
[facility]  Patient:    Jonathan Harrington, Jonathan Harrington                       MRN: [id number] Adm. Date:  [id number] Attending:  Estella Husk Dictator:   Mike Gip, P.A.-C. CC:         Dr. Merla Riches   History and Physical  CHIEF COMPLAINT:  Fever and shaking chills.  HISTORY OF PRESENT ILLNESS:  Jonathan Harrington is a 55 year old white male new to the GI service, referred at this time by Dr. Merla Riches.  Patient does have a history of chronic alcohol abuse, having quit approximately 2-1/2 months ago.  He is also a heavy smoker, has history of nephrolithiasis, and is at this time referred for evaluation of recurrent rigors and questionable abnormal CT scan of the abdomen.  The patient reports again stopping alcohol use about 2-1/2 months ago.  He states he was feeling well until about 10 days ago, when he developed shaking chills, fever, and some abdominal cramping.  This has occurred episodically two to three times per day, and this generally lasts from 45 minutes to an hour.  He has had some associated anorexia and weight loss.  He denies any headache, vomiting, melena, or hematochezia, no diarrhea, no urinary symptoms, no cough or sputum production, and no rash.  He has not had any injury or wounds and has no history of tick bite.  He was seen approximately a week ago at Urgent Care and was treated at that time with Levaquin x 3 days.  He did not improve and on May 19 went to [facility] ER. He had labs drawn there which were remarkable for a WBC of 5.7 with left shift, hemoglobin of 12, platelets of 154, sodium 128, potassium 3.3, SGOT 44, SGPT 33, alkaline phosphatase of 134, bilirubin of 2.9, and albumin of 2.8. He was diagnosed as having a "viral illness," was given IV fluids, potassium replacement, and sent home.  Today he was sent for CT scan of the abdomen, and we were told this was abnormal, involving his bowel and possibly his small bowel.  However, review  here with the radiologist shows an enlarged liver, spleen, and diverticulosis.  There is no acute abnormality definitely evident.  He is admitted to the hospital, however, acutely ill, hypotensive, with a blood pressure of 75/49 and a pulse of 111, with WBC of 12.9 with left shift, hemoglobin of 9.7, and mildly abnormal liver function studies with total bilirubin of 2.7, alkaline phosphatase of 318, SGOT of 46, SGPT of 39, sedimentation rate of 16, for further diagnostic evaluation, hydration, and IV antibiotics.  MEDICATIONS:  None on a regular basis.  ALLERGIES:  Z-PAK and IBUPROFEN.  PAST MEDICAL HISTORY:  Pertinent for alcohol abuse, currently inactive. Tobacco abuse, nephrolithiasis, and prior fracture of his lower extremity.  SOCIAL HISTORY:  The patient is married.  He works at a [employer].  He is a smoker, two packs per day, and was drinking one pint of whiskey per day until about two months ago, when he stopped.  FAMILY HISTORY:  Negative for GI disease.  REVIEW OF SYSTEMS:  CARDIOVASCULAR:  Denies any chest pain or anginal symptoms.  PULMONARY:  Negative for cough, shortness of breath, or sputum production.  GENITOURINARY:  Denies any dysuria, urgency, or frequency. GASTROINTESTINAL:  As outlined above.  PHYSICAL EXAMINATION:  VITAL SIGNS:  Blood pressure 75/49, pulse is 111, respirations 20, temperature is 97.6.  GENERAL:  A well-developed, ill-appearing white male in no acute distress.  He is alert and oriented.  HEENT:  Poor dentition.  SKIN:  No evidence of a skin rash.  CHEST:  Clear to A&P.  CARDIAC:  Tachycardic, regular rhythm, without murmur, rub, or gallop.  ABDOMEN:  Soft, nontender, nondistended.  Bowel sounds are active.  He does have an enlarged liver.  RECTAL:  Yellow, heme-negative stool.  EXTREMITIES:  Without clubbing, cyanosis, or edema.  NEUROLOGIC:  Intact.  LABORATORY DATA:  WBC of 12.9 with left shift, hemoglobin 9.7, platelets  232, MCV of 86.6.  PTT of 44.  Sedimentation rate of 16.  Electrolytes with sodium 131, potassium 3.5, creatinine 1.3, BUN 27.  Liver tests as above.  Albumin 2.5, protein 7.1.  Urinalysis:  Specific gravity 1.040, and ketones 40.  IMPRESSION: 1. Acute febrile illness with mild intermittent abdominal cramping,    leukocytosis with left shift, and anemia, also with mildly abnormal liver    function studies and evidence for dehydration.  There is some discrepancy    apparently in CT readings. 2. History of alcohol abuse, rule out underlying alcoholic liver disease. 3. History of nephrolithiasis with no evidence of hydronephrosis,    pyelonephritis, etc., on CT scan.  PLAN:  Patient is admitted to the service of Dr. Yancey Flemings.  Will pan-culture him, cover him with IV fluids and electrolyte placements, take upper abdominal ultrasound.  Will contact [facility] for a final reading on CT scan.  Check hepatitis serologies, fractionate his alkaline phosphatase, check haptoglobin.  Consider empiric antibiotic coverage for [facility] spotted fever or other rickettsial illnesses, doxycycline 100 mg b.i.d., and may need infectious disease consult.  For details, please see the orders. DD:  05/06/01 TD:  05/07/01 Job: [id number]

## 2011-09-07 LAB — CBC
HCT: 36.2 — ABNORMAL LOW
HCT: 38.1 — ABNORMAL LOW
HCT: 42.4
Hemoglobin: 12.8 — ABNORMAL LOW
Hemoglobin: 13.5
Hemoglobin: 15.1
MCHC: 35.3
MCHC: 35.4
MCHC: 35.4
MCHC: 35.5
MCV: 96.4
MCV: 96.9
MCV: 97.1
Platelets: 129 — ABNORMAL LOW
Platelets: 140 — ABNORMAL LOW
Platelets: 167
RBC: 3.54 — ABNORMAL LOW
RBC: 3.75 — ABNORMAL LOW
RBC: 3.93 — ABNORMAL LOW
RBC: 4.37
RDW: 12.3
RDW: 12.8
RDW: 12.8
RDW: 13
WBC: 5.9
WBC: 7.1
WBC: 7.9

## 2011-09-07 LAB — DIFFERENTIAL
Basophils Absolute: 0
Basophils Absolute: 0
Basophils Absolute: 0.1
Basophils Relative: 0
Basophils Relative: 0
Basophils Relative: 1
Basophils Relative: 2 — ABNORMAL HIGH
Eosinophils Absolute: 0.1
Eosinophils Absolute: 0.1
Eosinophils Absolute: 0.1
Eosinophils Relative: 1
Eosinophils Relative: 2
Eosinophils Relative: 3
Lymphocytes Relative: 14
Lymphocytes Relative: 16
Lymphocytes Relative: 21
Lymphs Abs: 1.1
Lymphs Abs: 1.4
Lymphs Abs: 1.5
Monocytes Absolute: 0.3
Monocytes Absolute: 0.4
Monocytes Absolute: 0.5
Monocytes Relative: 4
Monocytes Relative: 7
Monocytes Relative: 7
Monocytes Relative: 7
Neutro Abs: 4.3
Neutro Abs: 4.9
Neutro Abs: 6.4
Neutrophils Relative %: 66
Neutrophils Relative %: 68
Neutrophils Relative %: 75
Neutrophils Relative %: 81 — ABNORMAL HIGH

## 2011-09-07 LAB — COMPREHENSIVE METABOLIC PANEL
ALT: 45
AST: 37
Albumin: 3.8
Alkaline Phosphatase: 67
BUN: 6
CO2: 26
Calcium: 9.2
Chloride: 108
Creatinine, Ser: 0.84
GFR calc Af Amer: >60
GFR calc non Af Amer: >60
Glucose, Bld: 115 — ABNORMAL HIGH
Potassium: 4.1
Sodium: 140
Total Bilirubin: 1.9 — ABNORMAL HIGH
Total Protein: 7.3

## 2011-09-07 LAB — URINALYSIS, ROUTINE W REFLEX MICROSCOPIC
Bilirubin Urine: NEGATIVE
Glucose, UA: NEGATIVE
Hgb urine dipstick: NEGATIVE
Nitrite: NEGATIVE
Protein, ur: NEGATIVE
Specific Gravity, Urine: 1.022
Urobilinogen, UA: 1
pH: 6

## 2011-09-07 LAB — URINE MICROSCOPIC-ADD ON

## 2011-12-29 ENCOUNTER — Encounter: Payer: Self-pay | Admitting: Family Medicine

## 2012-01-19 ENCOUNTER — Ambulatory Visit (INDEPENDENT_AMBULATORY_CARE_PROVIDER_SITE_OTHER): Payer: Managed Care, Other (non HMO) | Admitting: Family Medicine

## 2012-01-19 ENCOUNTER — Encounter: Payer: Self-pay | Admitting: Family Medicine

## 2012-01-19 VITALS — BP 147/87 | HR 98 | Temp 98.8°F | Resp 18 | Ht 72.0 in | Wt 174.0 lb

## 2012-01-19 DIAGNOSIS — N529 Male erectile dysfunction, unspecified: Secondary | ICD-10-CM

## 2012-01-19 DIAGNOSIS — F411 Generalized anxiety disorder: Secondary | ICD-10-CM

## 2012-01-19 DIAGNOSIS — F419 Anxiety disorder, unspecified: Secondary | ICD-10-CM

## 2012-01-19 DIAGNOSIS — I10 Essential (primary) hypertension: Secondary | ICD-10-CM

## 2012-01-19 DIAGNOSIS — R9389 Abnormal findings on diagnostic imaging of other specified body structures: Secondary | ICD-10-CM

## 2012-01-19 LAB — POCT CBC
HCT, POC: 46.3 % (ref 43.5–53.7)
Hemoglobin: 15.6 g/dL (ref 14.1–18.1)
Lymph, poc: 2.3 (ref 0.6–3.4)
MCH, POC: 33.2 pg — AB (ref 27–31.2)
MCHC: 33.7 g/dL (ref 31.8–35.4)
WBC: 6.7 10*3/uL (ref 4.6–10.2)

## 2012-01-19 LAB — COMPREHENSIVE METABOLIC PANEL
BUN: 17 mg/dL (ref 6–23)
CO2: 23 mEq/L (ref 19–32)
Creat: 1.04 mg/dL (ref 0.50–1.35)
Glucose, Bld: 100 mg/dL — ABNORMAL HIGH (ref 70–99)
Total Bilirubin: 0.5 mg/dL (ref 0.3–1.2)
Total Protein: 7.7 g/dL (ref 6.0–8.3)

## 2012-01-19 MED ORDER — SILDENAFIL CITRATE 100 MG PO TABS: 50.0000 mg | ORAL_TABLET | Freq: Every day | ORAL | Status: DC | PRN

## 2012-01-19 MED ORDER — ALPRAZOLAM 1 MG PO TABS: 1.0000 mg | ORAL_TABLET | Freq: Two times a day (BID) | ORAL | Status: DC

## 2012-01-19 MED ORDER — LISINOPRIL 10 MG PO TABS: 10.0000 mg | ORAL_TABLET | Freq: Every day | ORAL | Status: DC

## 2012-01-19 NOTE — Progress Notes (Signed)
Patient Name: Jonathan Harrington Date of Birth: 1956-03-25 Medical Record Number: [id number] Gender: male Date of Encounter: 01/19/2012  History of Present Illness:  Jonathan Harrington is a 56 y.o. very pleasant male patient who presents with the following:  "I need to get my xanax refilled"- needs to do 90 day rx for mail away and a 30 day for local pharmacy.  "But that's not the main thing I'm here for.  I can't get an erection."  Sx for the last year.  Not a desire problem- can get a "half-way" erection but not enough for penetration.  No am erections either.  No trouble with passing urine.  Has never tried anything like Viagra. Did not end up having his follow- up chest CT done last summer due to loss of insurance- now is on his wife's insurance plan. Overall is doing very well- has gained back about 30lbs since his partial colectomy in 2012 and has cut back on his drinking.  Of note we stopped his antihypertensives when he was at his lowest weight and frail from surgery- he has not been seen in some time and never was started back on his medication.   Last stress test at least 10 years ago.  No family history of MI.    Patient Active Problem List  Diagnoses  . DIVERTICULOSIS-COLON  . DIVERTICULITIS, COLON  . ABDOMINAL PAIN-LLQ  . Nonspecific (abnormal) findings on radiological and other examination of body structure  . NONSPEC ABN FINDNG RAD & OTH EXAM ABDOMINAL AREA  . PERSONAL HX COLONIC POLYPS  . NONSPCIFC ABN FINDING RAD & OTH EXAM LUNG FIELD   No past medical history on file. No past surgical history on file. History  Substance Use Topics  . Smoking status: Current Everyday Smoker  . Smokeless tobacco: Not on file  . Alcohol Use: No   No family history on file. Allergies  Allergen Reactions  . Ibuprofen     Medication list has been reviewed and updated.  Review of Systems: As per HPI.  Otherwise- no chest pain, no use of nitroglycerin.    Physical Examination: Filed Vitals:    01/19/12 1531  BP: 147/87  Pulse: 98  Temp: 98.8 F (37.1 C)  TempSrc: Oral  Resp: 18  Height: 6' (1.829 m)  Weight: 174 lb (78.926 kg)    Body mass index is 23.60 kg/(m^2).  GEN: WDWN, NAD, Non-toxic, A & O x 3 HEENT: Atraumatic, Normocephalic. Neck supple. No masses, No LAD. Ears and Nose: No external deformity. CV: RRR, No M/G/R. No JVD. No thrill. No extra heart sounds. PULM: CTA B, no wheezes, crackles, rhonchi. No retractions. No resp. distress. No accessory muscle use. ABD: S, NT, ND, +BS. No rebound. No HSM.  Partial colectomy scar EXTR: No c/c/e NEURO Normal gait.  PSYCH: Normally interactive. Conversant. Not depressed or anxious appearing.  Calm demeanor.  GU: penis, scrotum and testicles wnl.  Prostate smooth and non- enlarged.   Results for orders placed in visit on 01/19/12  POCT CBC      Component Value Range   WBC 6.7  4.6 - 10.2 (K/uL)   Lymph, poc 2.3  0.6 - 3.4    POC LYMPH PERCENT 34.9  10 - 50 (%L)   MID (cbc) 0.4  0 - 0.9    POC MID % 5.4  0 - 12 (%M)   POC Granulocyte 4.0  2 - 6.9    Granulocyte percent 59.7  37 - 80 (%G)  RBC 4.70  4.69 - 6.13 (M/uL)   Hemoglobin 15.6  14.1 - 18.1 (g/dL)   HCT, POC 14.7  82.9 - 53.7 (%)   MCV 98.5 (*) 80 - 97 (fL)   MCH, POC 33.2 (*) 27 - 31.2 (pg)   MCHC 33.7  31.8 - 35.4 (g/dL)   RDW, POC 56.2     Platelet Count, POC 177  142 - 424 (K/uL)   MPV 9.2  0 - 99.8 (fL)    Assessment and Plan: Jonathan Harrington has recovered from his severe diverticulits and resutling partial colectomy and acute renal failure from a year ago.  He has gained back 30 lbs and is feeling much better!  He has cut down on drinking.  His main concern today is ED.  Will check a CMP, TSH and PSA as part of his evaluation.  Given his history of heavy tobacco use and atherosclerotic changes on chest CT, offered a cardiology evaluation to the patient .  He declined this for now but did agree to stop medicaion right away ans call if any issues arise such  as CP  Order CT chest to follow- up lung nodules seen on CT 06/2010    Elevated blood pressure:  [name]'s BP has recovered since he regained weight following his colectomy.  We will go ahead and restart lisinopril at 10mg / day.    ED: labs pending as above to rule- out hypogonadism as cause.  Trial of Viagra 100mg , 1/2 tab daily as needed.  As above warned patient that if any problems such as CP or priapism stop medication and seek help right away.    Anxiety:  Refilled xanax 1mg  BID, one #60 to fill locally and another rx for 180 with 2RF for mail order.    Come in for a recheck in 2 to 3 weeks for BP recheck and fasting lipids.

## 2012-01-19 NOTE — Patient Instructions (Signed)
Follow- up in 2 or 3 weeks for fasting lipid panel and BP check

## 2012-01-20 LAB — TESTOSTERONE, FREE, TOTAL, SHBG
Sex Hormone Binding: 29 nmol/L (ref 13–71)
Testosterone, Free: 59.1 pg/mL (ref 47.0–244.0)
Testosterone-% Free: 2.1 % (ref 1.6–2.9)
Testosterone: 280.74 ng/dL (ref 250–890)

## 2012-01-23 ENCOUNTER — Telehealth: Payer: Self-pay

## 2012-01-23 NOTE — Telephone Encounter (Signed)
Message copied by Kerney Elbe on Mon Jan 23, 2012 12:08 PM ------      Message from: Abbe Amsterdam C      Created: Sun Jan 22, 2012  6:57 AM       Please pull chart for me!  Thanks      JC

## 2012-01-23 NOTE — Telephone Encounter (Signed)
Pt calling to ask if we have any samples for Viagra bc insurance doesn't cover and he doesn't want to pay $150 and then possibly have a SE and be out the money. Told pt we do not have any samples/coupons, but told him how to go online to Viagra official site and get coupon for a free 3-pill trial. Pt agreed and will CB if he has any trouble.

## 2012-01-23 NOTE — Telephone Encounter (Signed)
PT SAW DR Dallas Schimke. Was given a number of prescriptions and has a questions regarding one.  Please call back for questions.

## 2012-01-30 ENCOUNTER — Encounter: Payer: Self-pay | Admitting: Family Medicine

## 2012-02-02 ENCOUNTER — Encounter: Payer: Self-pay | Admitting: Family Medicine

## 2012-12-18 ENCOUNTER — Ambulatory Visit (INDEPENDENT_AMBULATORY_CARE_PROVIDER_SITE_OTHER): Payer: Managed Care, Other (non HMO) | Admitting: Family Medicine

## 2012-12-18 VITALS — BP 122/74 | HR 81 | Temp 97.7°F | Resp 16 | Ht 71.0 in | Wt 181.0 lb

## 2012-12-18 DIAGNOSIS — M25579 Pain in unspecified ankle and joints of unspecified foot: Secondary | ICD-10-CM

## 2012-12-18 DIAGNOSIS — M25571 Pain in right ankle and joints of right foot: Secondary | ICD-10-CM

## 2012-12-18 DIAGNOSIS — M109 Gout, unspecified: Secondary | ICD-10-CM

## 2012-12-18 LAB — POCT CBC
Hemoglobin: 15.3 g/dL (ref 14.1–18.1)
MCH, POC: 30.2 pg (ref 27–31.2)
MPV: 8.9 fL (ref 0–99.8)
POC MID %: 8.6 %M (ref 0–12)
RBC: 5.06 M/uL (ref 4.69–6.13)
WBC: 8.4 10*3/uL (ref 4.6–10.2)

## 2012-12-18 MED ORDER — HYDROCODONE-ACETAMINOPHEN 10-325 MG PO TABS: 1.0000 | ORAL_TABLET | Freq: Four times a day (QID) | ORAL | Status: DC | PRN

## 2012-12-18 MED ORDER — ALPRAZOLAM 1 MG PO TABS: 1.0000 mg | ORAL_TABLET | Freq: Two times a day (BID) | ORAL | Status: DC

## 2012-12-18 MED ORDER — PREDNISONE 20 MG PO TABS: ORAL_TABLET | ORAL | Status: DC

## 2012-12-18 MED ORDER — METHYLPREDNISOLONE SODIUM SUCC 125 MG IJ SOLR
62.5000 mg | Freq: Once | INTRAMUSCULAR | Status: AC
  Administered 2012-12-18: 62.5 mg via INTRAMUSCULAR

## 2012-12-18 NOTE — Progress Notes (Signed)
Subjective:    Patient ID: Jonathan Harrington, male    DOB: 11-15-1956, 57 y.o.   MRN: [id number] Chief Complaint  Patient presents with  . Gout    right foot and hip pain    HPI  Had gout prior.  Ibuprofen causes severe swelling - including face - angioedema.  Within 10-15 min. Had one episode of gout prior - seen here - had to have specialist inject. 2 ago, he developed right hip pain. Had to get crutches. Know radiating from hip down to feet.  5d ago dev Rt foot and ankle pain so has been limpging. Now can't even lay on right side.  He was started on lisinopril but stopped it as he was monitoring his bp at home which has been normal. Didn't have $$ to f/u chest CT.  Was "around" marijuana this wkend so might be in his urine. Review of Systems    BP 122/74  Pulse 81  Temp(Src) 97.7 F (36.5 C) (Oral)  Resp 16  Ht 5\' 11"  (1.803 m)  Wt 181 lb (82.101 kg)  BMI 25.26 kg/m2  SpO2 98% Objective:   Physical Exam        Results for orders placed in visit on 12/18/12  POCT CBC      Component Value Range   WBC 8.4  4.6 - 10.2 K/uL   Lymph, poc 2.2  0.6 - 3.4   POC LYMPH PERCENT 26.4  10 - 50 %L   MID (cbc) 0.7  0 - 0.9   POC MID % 8.6  0 - 12 %M   POC Granulocyte 5.5  2 - 6.9   Granulocyte percent 65.0  37 - 80 %G   RBC 5.06  4.69 - 6.13 M/uL   Hemoglobin 15.3  14.1 - 18.1 g/dL   HCT, POC 40.9  81.1 - 53.7 %   MCV 94.5  80 - 97 fL   MCH, POC 30.2  27 - 31.2 pg   MCHC 32.0  31.8 - 35.4 g/dL   RDW, POC 91.4     Platelet Count, POC 169  142 - 424 K/uL   MPV 8.9  0 - 99.8 fL    Assessment & Plan:  1. 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 Anxiety - needs to be seen for xanax refills, consider UDS at f/u. 3.  Hip pain - rec hip xray - pt declines. If doesn't resolve w/ pred, rtc for imaging and cons injection. 4. Ankle pain, right - Plan: POCT CBC, POCT SEDIMENTATION RATE 5.  Gout attack - Plan: POCT CBC,  POCT SEDIMENTATION RATE, Uric Acid, methylPREDNISolone sodium succinate (SOLU-MEDROL) 125 mg/2 mL injection 62.5 mg, allopurinol (ZYLOPRIM) 300 MG tablet  Meds ordered this encounter  Medications  . HYDROcodone-acetaminophen (NORCO) 10-325 MG per tablet    Sig: Take 1 tablet by mouth every 6 (six) hours as needed.    Dispense:  30 tablet    Refill:  0  .  ALPRAZolam (XANAX) 1 MG tablet    Sig: Take 1 tablet (1 mg total) by mouth 2 (two) times daily.    Dispense:  60 tablet    Refill:  0  . ALPRAZolam (XANAX) 1 MG tablet    Sig: Take 1 tablet (1 mg total) by mouth 2 (two) times daily.    Dispense:  180 tablet    Refill:  1  . predniSONE (DELTASONE) 20 MG tablet  Sig: Take 3 tabs qd x 2d, then 2 tabs qd x 2d, then 1 tab po qd x 2d.    Dispense:  12 tablet    Refill:  0  . methylPREDNISolone sodium succinate (SOLU-MEDROL) 125 mg/2 mL injection 62.5 mg    Sig:   . allopurinol (ZYLOPRIM) 300 MG tablet    Sig: Take 1 tablet (300 mg total) by mouth daily.    Dispense:  30 tablet    Refill:  2

## 2012-12-19 ENCOUNTER — Telehealth: Payer: Self-pay | Admitting: Radiology

## 2012-12-19 MED ORDER — ALLOPURINOL 300 MG PO TABS: 300.0000 mg | ORAL_TABLET | Freq: Every day | ORAL | Status: DC

## 2012-12-19 NOTE — Telephone Encounter (Signed)
Allopurinol sent to pharmacy. Do not start until he is done with the prednisone and symptoms are totally resolved - otherwise it can trigger flair.  RTC 4-6 wks to recheck uric acid level and see if he needs dose changed.

## 2012-12-19 NOTE — Telephone Encounter (Signed)
Your uric acid - which can trigger gout - is elevated. I would recommend starting on allopurinol AFTER your flair resolves and you are pain free to lower your uric acid level so you can hopefully prevent further flares. Make sure you are avoiding gout triggers like alcohol and red meat.         He is advised, states he feels better and would like the Allopurinol sent in to the [facility] [address]. Please advise.

## 2012-12-20 NOTE — Telephone Encounter (Signed)
Thanks, patient aware.

## 2013-01-04 ENCOUNTER — Other Ambulatory Visit: Payer: Self-pay | Admitting: Family Medicine

## 2013-01-09 ENCOUNTER — Telehealth: Payer: Self-pay | Admitting: Radiology

## 2013-01-09 NOTE — Telephone Encounter (Signed)
Pharmacy has sent request for refill on [facility]

## 2013-01-10 NOTE — Telephone Encounter (Signed)
Denied. This was for an acute gout attack. If pt is continue to have problems with his ankle or hip, he needs to RTC to consider imaging, injections, alternative gout treatments (prednisone, indomethacin), and lab work to see if he needs to get his uric acid lower.

## 2013-01-11 NOTE — Telephone Encounter (Signed)
Called patient. To advise. Left message for him to call me back.

## 2013-01-11 NOTE — Telephone Encounter (Signed)
Spoke with pt advised we could not refill the Rx. I advised to RTC to recheck as Dr. Clelia Croft requested.

## 2013-01-17 ENCOUNTER — Ambulatory Visit: Payer: Managed Care, Other (non HMO)

## 2013-01-17 ENCOUNTER — Ambulatory Visit: Payer: Managed Care, Other (non HMO) | Admitting: Internal Medicine

## 2013-01-17 VITALS — BP 152/81 | HR 92 | Temp 97.8°F | Resp 16 | Ht 71.0 in | Wt 181.0 lb

## 2013-01-17 DIAGNOSIS — R071 Chest pain on breathing: Secondary | ICD-10-CM

## 2013-01-17 DIAGNOSIS — R079 Chest pain, unspecified: Secondary | ICD-10-CM

## 2013-01-17 DIAGNOSIS — R109 Unspecified abdominal pain: Secondary | ICD-10-CM

## 2013-01-17 DIAGNOSIS — M109 Gout, unspecified: Secondary | ICD-10-CM

## 2013-01-17 DIAGNOSIS — N200 Calculus of kidney: Secondary | ICD-10-CM

## 2013-01-17 LAB — POCT CBC
HCT, POC: 45.7 % (ref 43.5–53.7)
Hemoglobin: 15.3 g/dL (ref 14.1–18.1)
Lymph, poc: 2.5 (ref 0.6–3.4)
MCH, POC: 31.5 pg — AB (ref 27–31.2)
MCHC: 33.5 g/dL (ref 31.8–35.4)
MPV: 8 fL (ref 0–99.8)
POC MID %: 7.3 %M (ref 0–12)
WBC: 7.1 10*3/uL (ref 4.6–10.2)

## 2013-01-17 LAB — POCT UA - MICROSCOPIC ONLY
Casts, Ur, LPF, POC: NEGATIVE
Crystals, Ur, HPF, POC: NEGATIVE

## 2013-01-17 LAB — POCT URINALYSIS DIPSTICK
Protein, UA: NEGATIVE
Spec Grav, UA: 1.015
Urobilinogen, UA: 0.2
pH, UA: 6

## 2013-01-17 MED ORDER — HYDROCODONE-ACETAMINOPHEN 10-325 MG PO TABS: 1.0000 | ORAL_TABLET | Freq: Four times a day (QID) | ORAL | Status: DC | PRN

## 2013-01-17 NOTE — Patient Instructions (Addendum)
Kidney Stones Kidney stones (ureteral lithiasis) are deposits that form inside your kidneys. The intense pain is caused by the stone moving through the urinary tract. When the stone moves, the ureter goes into spasm around the stone. The stone is usually passed in the urine.  CAUSES   A disorder that makes certain neck glands produce too much parathyroid hormone (primary hyperparathyroidism).  A buildup of uric acid crystals.  Narrowing (stricture) of the ureter.  A kidney obstruction present at birth (congenital obstruction).  Previous surgery on the kidney or ureters.  Numerous kidney infections. SYMPTOMS   Feeling sick to your stomach (nauseous).  Throwing up (vomiting).  Blood in the urine (hematuria).  Pain that usually spreads (radiates) to the groin.  Frequency or urgency of urination. DIAGNOSIS   Taking a history and physical exam.  Blood or urine tests.  Computerized X-ray scan (CT scan).  Occasionally, an examination of the inside of the urinary bladder (cystoscopy) is performed. TREATMENT   Observation.  Increasing your fluid intake.  Surgery may be needed if you have severe pain or persistent obstruction. The size, location, and chemical composition are all important variables that will determine the proper choice of action for you. Talk to your caregiver to better understand your situation so that you will minimize the risk of injury to yourself and your kidney.  HOME CARE INSTRUCTIONS   Drink enough water and fluids to keep your urine clear or pale yellow.  Strain all urine through the provided strainer. Keep all particulate matter and stones for your caregiver to see. The stone causing the pain may be as small as a grain of salt. It is very important to use the strainer each and every time you pass your urine. The collection of your stone will allow your caregiver to analyze it and verify that a stone has actually passed.  Only take over-the-counter or  prescription medicines for pain, discomfort, or fever as directed by your caregiver.  Make a follow-up appointment with your caregiver as directed.  Get follow-up X-rays if required. The absence of pain does not always mean that the stone has passed. It may have only stopped moving. If the urine remains completely obstructed, it can cause loss of kidney function or even complete destruction of the kidney. It is your responsibility to make sure X-rays and follow-ups are completed. Ultrasounds of the kidney can show blockages and the status of the kidney. Ultrasounds are not associated with any radiation and can be performed easily in a matter of minutes. SEEK IMMEDIATE MEDICAL CARE IF:   Pain cannot be controlled with the prescribed medicine.  You have a fever.  The severity or intensity of pain increases over 18 hours and is not relieved by pain medicine.  You develop a new onset of abdominal pain.  You feel faint or pass out. MAKE SURE YOU:   Understand these instructions.  Will watch your condition.  Will get help right away if you are not doing well or get worse. Document Released: 11/28/2005 Document Revised: 02/20/2012 Document Reviewed: 03/26/2010 [facility] Patient Information 2013 [employer], [place]. Uric Acid Nephropathy Uric acid nephropathy is a condition of kidney damage that occurs when there is too much uric acid in the body. When the uric acid is too high, crystals can form in the tissues of the body. When this happens in the kidneys, it is called gouty or uric acid nephropathy.  Uric acid comes from the breakdown of purines in your diet. If too much  uric acid is made, or your body does not efficiently clear uric acid from your body, then this can lead to too much uric acid in your blood. The whole system can also get very overworked. In those cases, kidney damage occurs; but the disease does not always cause high uric acid levels in the blood. The disease does not always cause  high uric acid levels in the blood. CAUSES  Most cases of uric acid nephropathy happen during the treatment of cancer. This is because of the increase in uric acid production during chemotherapy.  Uric Acid Nephropathy happens frequently in people with gout. Some more common causes of high uric acid in the body include:  Overproduction of urate  Breakdown of blood.  Lymphomas and leukemias.  Polycythemia vera.  Psoriasis (severe).  Paget's disease.  Muscle breakdown.  Exercise.  Alcohol.  Obesity.  Purine-rich diet.  Decreased excretion of uric acid  Primary idiopathic hyperuricemia.  Kidney disease.  Diabetes insipidus.  High blood pressure.  Acidosis.  Down syndrome.  Starvation.  Sarcoidosis.  Lead intake.  Hyperparathyroidism.  Hypothyroidism.  Toxemia of Pregnancy.  Drug ingestion.  Aspirin (less than 2 g per day).  Water pills (diuretics).  Alcohol.  Sinemet.  Nicotinic acid.  Combined mechanism.  Glucose-6-phosphate dehydrogenase deficiency.  Alcohol. SYMPTOMS When uric acid nephropathy leads to sudden kidney failure, symptoms can include:  Nausea and vomiting.  Lethargy and seizures.  Complete kidney failure with the inability to pass your water or urinating less. If stones form in the kidneys or ureters, some of the problems may include:  Flank pain.  Blood in the urine.  Urinary frequency.  Uncomfortable or painful urination. DIAGNOSIS  The diagnosis is based on finding uric acid crystals in joints, tissues or body fluids.  Blood work is done to check on the uric acid levels but is not always abnormal. Kidney function tests and other blood work is also done.  A urinalysis is usually performed and will show if uric acid crystals are found in the urine. The urine sample is usually normal. TREATMENT  The treatment is aimed at stopping the immediate problems and preventing them from coming back. It is also for  the prevention of complications which come from longstanding gout or high uric acid in the tissues.  This is often done with medications.  If the start of kidney failure has been sudden and more severe, dialysis may be used.  Your caregiver may have you take a special diet which is low in purines. Just cutting out animal meats is a great help to cut down on your purine intake.  Keep up a good fluid intake. You should drink enough water to put out 2 to 3 quarts or liters of urine per day. PREVENTION  Prevention is done by using medications which decrease uric acid production or with medications to increase the rate of removal by the kidneys.  Obesity, high purine diets, alcohol, certain medications and too much exercise can elevate the uric acid. SEEK IMMEDIATE MEDICAL CARE IF:  You have been put on allopurinol and develop a rash. This is a medical emergency.  You develop flank pain, urinate less or stop urinating.  Your urine becomes smoky or bloody colored.  You develop nausea or vomiting.  You develop lethargy or seizures. Document Released: 09/25/2007 Document Revised: 02/20/2012 Document Reviewed: 09/25/2007 [facility] Patient Information 2013 [place], [place].

## 2013-01-17 NOTE — Progress Notes (Signed)
Subjective:    Patient ID: Jonathan Harrington, male    DOB: 09-30-1956, 57 y.o.   MRN: [id number]  HPI Left flank pain woke him from sleep yesterday. Pain persists and has not moved. This is not his diverticulitis pain. Has a past hx of kidney stones. Has no radiation, urgency, hematuria, dysuria.  No fever or fatigue. Smoker  Review of Systems     Objective:   Physical Exam  Vitals reviewed. Constitutional: He is oriented to person, place, and time. He appears well-developed and well-nourished.  Eyes: EOM are normal. No scleral icterus.  Cardiovascular: Normal rate, regular rhythm and normal heart sounds.   Pulmonary/Chest: Effort normal and breath sounds normal. He exhibits no tenderness.  Abdominal: Soft. Normal appearance and bowel sounds are normal. He exhibits no distension and no mass. There is tenderness in the left upper quadrant. There is CVA tenderness. There is no rebound and no guarding. No hernia.         Only tender one spot left flank.  No pain with trunk rotatoin, lateral flexion  Musculoskeletal: He exhibits tenderness.  Neurological: He is alert and oriented to person, place, and time. He exhibits normal muscle tone. Coordination normal.  Skin: No rash noted.       No shingles seen  Psychiatric: He has a normal mood and affect.   [facility] reading (PRIMARY) by  Dr.Latonya Knight unusual calcifications left upper quad/cxr has large right hilar calcification Results for orders placed in visit on 01/17/13  POCT CBC      Component Value Range   WBC 7.1  4.6 - 10.2 K/uL   Lymph, poc 2.5  0.6 - 3.4   POC LYMPH PERCENT 34.8  10 - 50 %L   MID (cbc) 0.5  0 - 0.9   POC MID % 7.3  0 - 12 %M   POC Granulocyte 4.1  2 - 6.9   Granulocyte percent 57.9  37 - 80 %G   RBC 4.86  4.69 - 6.13 M/uL   Hemoglobin 15.3  14.1 - 18.1 g/dL   HCT, POC 46.9  62.9 - 53.7 %   MCV 94.1  80 - 97 fL   MCH, POC 31.5 (*) 27 - 31.2 pg   MCHC 33.5  31.8 - 35.4 g/dL   RDW, POC 52.8     Platelet Count, POC  179  142 - 424 K/uL   MPV 8.0  0 - 99.8 fL  POCT UA - MICROSCOPIC ONLY      Component Value Range   WBC, Ur, HPF, POC 0-1     RBC, urine, microscopic 0-4     Bacteria, U Microscopic trace     Mucus, UA trace     Epithelial cells, urine per micros 0-2     Crystals, Ur, HPF, POC neg     Casts, Ur, LPF, POC neg     Yeast, UA neg    POCT URINALYSIS DIPSTICK      Component Value Range   Color, UA yellow     Clarity, UA clear     Glucose, UA neg     Bilirubin, UA neg     Ketones, UA neg     Spec Grav, UA 1.015     Blood, UA tracintact     pH, UA 6.0     Protein, UA neg     Urobilinogen, UA 0.2     Nitrite, UA neg     Leukocytes, UA Negative  Assessment & Plan:  Left flank pain/take allopurinol for gout to prevent stones Abnormal xray/schedule ct urogram tomorrow. Quit smoking HC for pain

## 2013-01-26 ENCOUNTER — Emergency Department (HOSPITAL_COMMUNITY)
Admission: EM | Admit: 2013-01-26 | Discharge: 2013-01-27 | Disposition: A | Discharge status: Home / Self Care | Payer: Managed Care, Other (non HMO) | Attending: Emergency Medicine | Admitting: Emergency Medicine

## 2013-01-26 ENCOUNTER — Encounter (HOSPITAL_COMMUNITY): Payer: Self-pay

## 2013-01-26 DIAGNOSIS — F17200 Nicotine dependence, unspecified, uncomplicated: Secondary | ICD-10-CM | POA: Insufficient documentation

## 2013-01-26 DIAGNOSIS — F10929 Alcohol use, unspecified with intoxication, unspecified: Secondary | ICD-10-CM

## 2013-01-26 DIAGNOSIS — Z87448 Personal history of other diseases of urinary system: Secondary | ICD-10-CM | POA: Insufficient documentation

## 2013-01-26 DIAGNOSIS — Z9049 Acquired absence of other specified parts of digestive tract: Secondary | ICD-10-CM | POA: Insufficient documentation

## 2013-01-26 DIAGNOSIS — Z8719 Personal history of other diseases of the digestive system: Secondary | ICD-10-CM | POA: Insufficient documentation

## 2013-01-26 DIAGNOSIS — F1010 Alcohol abuse, uncomplicated: Secondary | ICD-10-CM | POA: Insufficient documentation

## 2013-01-26 DIAGNOSIS — Z79899 Other long term (current) drug therapy: Secondary | ICD-10-CM | POA: Insufficient documentation

## 2013-01-26 DIAGNOSIS — I10 Essential (primary) hypertension: Secondary | ICD-10-CM | POA: Insufficient documentation

## 2013-01-26 DIAGNOSIS — R45851 Suicidal ideations: Secondary | ICD-10-CM

## 2013-01-26 LAB — CBC
HCT: 43.3 % (ref 39.0–52.0)
MCHC: 35.8 g/dL (ref 30.0–36.0)
RDW: 13.7 % (ref 11.5–15.5)

## 2013-01-26 LAB — RAPID URINE DRUG SCREEN, HOSP PERFORMED
Amphetamines: NOT DETECTED
Benzodiazepines: NOT DETECTED
Opiates: POSITIVE — AB

## 2013-01-26 LAB — COMPREHENSIVE METABOLIC PANEL
Albumin: 4 g/dL (ref 3.5–5.2)
Alkaline Phosphatase: 75 U/L (ref 39–117)
BUN: 7 mg/dL (ref 6–23)
Potassium: 4.1 mEq/L (ref 3.5–5.1)
Total Protein: 8.3 g/dL (ref 6.0–8.3)

## 2013-01-26 LAB — ETHANOL: Alcohol, Ethyl (B): 291 mg/dL — ABNORMAL HIGH (ref 0–11)

## 2013-01-26 MED ORDER — LORAZEPAM 1 MG PO TABS: 1.0000 mg | ORAL_TABLET | Freq: Four times a day (QID) | ORAL | Status: DC | PRN

## 2013-01-26 MED ORDER — FOLIC ACID 1 MG PO TABS: 1.0000 mg | ORAL_TABLET | Freq: Every day | ORAL | Status: DC

## 2013-01-26 MED ORDER — NICOTINE 21 MG/24HR TD PT24: 21.0000 mg | MEDICATED_PATCH | Freq: Every day | TRANSDERMAL | Status: DC

## 2013-01-26 MED ORDER — VITAMIN B-1 100 MG PO TABS: 100.0000 mg | ORAL_TABLET | Freq: Every day | ORAL | Status: DC

## 2013-01-26 MED ORDER — ONDANSETRON HCL 4 MG PO TABS: 4.0000 mg | ORAL_TABLET | Freq: Three times a day (TID) | ORAL | Status: DC | PRN

## 2013-01-26 MED ORDER — THIAMINE HCL 100 MG/ML IJ SOLN: 100.0000 mg | Freq: Every day | INTRAMUSCULAR | Status: DC

## 2013-01-26 MED ORDER — ALUM & MAG HYDROXIDE-SIMETH 200-200-20 MG/5ML PO SUSP: 30.0000 mL | ORAL | Status: DC | PRN

## 2013-01-26 MED ORDER — LORAZEPAM 1 MG PO TABS: 1.0000 mg | ORAL_TABLET | Freq: Three times a day (TID) | ORAL | Status: DC | PRN

## 2013-01-26 MED ORDER — ADULT MULTIVITAMIN W/MINERALS CH: 1.0000 | ORAL_TABLET | Freq: Every day | ORAL | Status: DC

## 2013-01-26 MED ORDER — LORAZEPAM 2 MG/ML IJ SOLN: 1.0000 mg | Freq: Four times a day (QID) | INTRAMUSCULAR | Status: DC | PRN

## 2013-01-26 NOTE — ED Notes (Addendum)
Pt. In new blue scrubs. Pt. And belongings searched and wanded by security. Pt. Has 2 belongings bag. Pt. Belongings locked up behind the nurses station in triage. Pt. Has jeans, wallet,shoes, underware,knife, cell phone case, shirt and jacket, gloves and 2 sets keys.
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
## 2013-01-26 NOTE — ED Notes (Signed)
Act team in with patient 
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
## 2013-01-27 NOTE — ED Notes (Signed)
Patient discharge to home with written and verbal instructions. With bus pass.

## 2013-01-27 NOTE — ED Provider Notes (Signed)
Medical screening examination/treatment/procedure(s) were conducted as a shared visit with non-physician practitioner(s) and myself.  I personally evaluated the patient during the encounter.  Alcohol abuse and suicidal ideation. Behavioral health consult pending  Donnetta Hutching, MD 01/27/13 1728
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
## 2013-03-18 ENCOUNTER — Other Ambulatory Visit: Payer: Self-pay | Admitting: Family Medicine

## 2013-05-02 ENCOUNTER — Encounter: Payer: Self-pay | Admitting: Internal Medicine

## 2013-08-06 ENCOUNTER — Other Ambulatory Visit: Payer: Self-pay | Admitting: Internal Medicine

## 2013-09-17 ENCOUNTER — Ambulatory Visit (INDEPENDENT_AMBULATORY_CARE_PROVIDER_SITE_OTHER): Payer: Managed Care, Other (non HMO) | Admitting: Family Medicine

## 2013-09-17 ENCOUNTER — Other Ambulatory Visit: Payer: Self-pay | Admitting: Family Medicine

## 2013-09-17 VITALS — BP 142/76 | HR 90 | Temp 97.9°F | Resp 17 | Ht 70.5 in | Wt 185.0 lb

## 2013-09-17 DIAGNOSIS — F411 Generalized anxiety disorder: Secondary | ICD-10-CM

## 2013-09-17 DIAGNOSIS — F419 Anxiety disorder, unspecified: Secondary | ICD-10-CM | POA: Insufficient documentation

## 2013-09-17 DIAGNOSIS — J019 Acute sinusitis, unspecified: Secondary | ICD-10-CM

## 2013-09-17 DIAGNOSIS — R5381 Other malaise: Secondary | ICD-10-CM

## 2013-09-17 DIAGNOSIS — R05 Cough: Secondary | ICD-10-CM

## 2013-09-17 DIAGNOSIS — R059 Cough, unspecified: Secondary | ICD-10-CM

## 2013-09-17 LAB — COMPREHENSIVE METABOLIC PANEL
Albumin: 4.2 g/dL (ref 3.5–5.2)
BUN: 11 mg/dL (ref 6–23)
CO2: 27 mEq/L (ref 19–32)
Calcium: 9.1 mg/dL (ref 8.4–10.5)
Chloride: 104 mEq/L (ref 96–112)
Glucose, Bld: 127 mg/dL — ABNORMAL HIGH (ref 70–99)
Potassium: 4.3 mEq/L (ref 3.5–5.3)
Total Protein: 7.1 g/dL (ref 6.0–8.3)

## 2013-09-17 LAB — POCT CBC
HCT, POC: 40.3 % — AB (ref 43.5–53.7)
Hemoglobin: 13 g/dL — AB (ref 14.1–18.1)
Lymph, poc: 1.8 (ref 0.6–3.4)
MCH, POC: 34 pg — AB (ref 27–31.2)
MCHC: 32.3 g/dL (ref 31.8–35.4)
MCV: 105.6 fL — AB (ref 80–97)
POC MID %: 6.9 %M (ref 0–12)
WBC: 6.5 10*3/uL (ref 4.6–10.2)

## 2013-09-17 MED ORDER — AMOXICILLIN-POT CLAVULANATE 875-125 MG PO TABS: 1.0000 | ORAL_TABLET | Freq: Two times a day (BID) | ORAL | Status: DC

## 2013-09-17 MED ORDER — ALPRAZOLAM 1 MG PO TABS: 1.0000 mg | ORAL_TABLET | Freq: Two times a day (BID) | ORAL | Status: DC

## 2013-09-17 NOTE — Progress Notes (Addendum)
[facility] [address] [place] [address] [phone]  Date:  09/17/2013   Name:  Jonathan Harrington   DOB:  [birth year]   MRN:  [id number]  PCP:  No primary provider on file.    Chief Complaint: Cough and rx refill   History of Present Illness:  Jonathan Harrington is a 57 y.o. very pleasant male patient who presents with the following:  Here today for a recheck.  He has noted cough for about one week.   Cough can be productive or yellow mucus.  ST for 3 days.  He has tried OTC cough medication   He has also noted a runny nose.   He has been able to work but today he had chills   He is drinking one or two beers a night.   He still takes xanax BID.    He is working a fair amount right now, which is great. He makes and sharpens scissors.  He was rehired by his old company and is very happy to be working again.    Patient Active Problem List   Diagnosis Date Noted  . Gout 01/17/2013  . NONSPEC ABN FINDNG RAD & OTH EXAM ABDOMINAL AREA 09/16/2010  . DIVERTICULOSIS-COLON 08/13/2010  . DIVERTICULITIS, COLON 08/13/2010  . ABDOMINAL PAIN-LLQ 08/13/2010  . Nonspecific (abnormal) findings on radiological and other examination of body structure 08/13/2010  . PERSONAL HX COLONIC POLYPS 08/13/2010  . NONSPCIFC ABN FINDING RAD & OTH EXAM LUNG FIELD 08/13/2010    Past Medical History  Diagnosis Date  . Hypertension   . Diverticulitis 2011    resulted in partial colectomy  . Hematuria   . Anxiety     Past Surgical History  Procedure Laterality Date  . Colon surgery  2011    partial colectomy    History  Substance Use Topics  . Smoking status: Current Every Day Smoker -- 1.50 packs/day  . Smokeless tobacco: Never Used  . Alcohol Use: 12.6 oz/week    21 Cans of beer per week     Comment: about 3 beers daily- history of heavy liquor abuse    History reviewed. No pertinent family history.  Allergies  Allergen Reactions  . Ibuprofen Other (See Comments)     Unknown reaction    Medication list has been reviewed and updated.  Current Outpatient Prescriptions on File Prior to Visit  Medication Sig Dispense Refill  . allopurinol (ZYLOPRIM) 300 MG tablet TAKE 1 TABLET BY MOUTH DAILY  30 tablet  3  . ALPRAZolam (XANAX) 1 MG tablet Take 1 tablet (1 mg total) by mouth 2 (two) times daily.  180 tablet  1  . HYDROcodone-acetaminophen (NORCO) 10-325 MG per tablet Take 1 tablet by mouth every 6 (six) hours as needed.  30 tablet  0  . Multiple Vitamins-Minerals (MULTIVITAMIN WITH MINERALS) tablet Take 1 tablet by mouth daily.       No current facility-administered medications on file prior to visit.    Review of Systems:  As per HPI- otherwise negative.   Physical Examination: Filed Vitals:   09/17/13 1346  BP: 142/76  Pulse: 101  Temp: 97.9 F (36.6 C)  Resp: 17   Filed Vitals:   09/17/13 1346  Height: 5' 10.5" (1.791 m)  Weight: 185 lb (83.915 kg)   Body mass index is 26.16 kg/(m^2). Ideal Body Weight: Weight in (lb) to have BMI = 25: 176.4  GEN: WDWN, NAD, Non-toxic, A & O x 3  HEENT: Atraumatic, Normocephalic. Neck supple. No masses, No LAD.  Bilateral TM wnl, oropharynx normal.  PEERL,EOMI.   Ears and Nose: No external deformity. CV: RRR, No M/G/R. No JVD. No thrill. No extra heart sounds. PULM: CTA B, no wheezes, crackles, rhonchi. No retractions. No resp. distress. No accessory muscle use. ABD: S, NT, ND, +BS. No rebound. No HSM. EXTR: No c/c/e NEURO Normal gait.  PSYCH: Normally interactive. Conversant. Not depressed or anxious appearing.  Calm demeanor.   Results for orders placed in visit on 09/17/13  POCT CBC      Result Value Range   WBC 6.5  4.6 - 10.2 K/uL   Lymph, poc 1.8  0.6 - 3.4   POC LYMPH PERCENT 27.0  10 - 50 %L   MID (cbc) 0.4  0 - 0.9   POC MID % 6.9  0 - 12 %M   POC Granulocyte 4.3  2 - 6.9   Granulocyte percent 66.1  37 - 80 %G   RBC 3.82 (*) 4.69 - 6.13 M/uL   Hemoglobin 13.0 (*) 14.1 - 18.1 g/dL    HCT, POC 40.9 (*) 81.1 - 53.7 %   MCV 105.6 (*) 80 - 97 fL   MCH, POC 34.0 (*) 27 - 31.2 pg   MCHC 32.3  31.8 - 35.4 g/dL   RDW, POC 91.4     Platelet Count, POC 154  142 - 424 K/uL   MPV 8.4  0 - 99.8 fL  POCT INFLUENZA A/B      Result Value Range   Influenza A, POC Negative     Influenza B, POC Negative       Assessment and Plan: Cough - Plan: POCT CBC, POCT Influenza A/B  Other malaise and fatigue - Plan: Comprehensive metabolic panel  Anxiety state, unspecified - Plan: ALPRAZolam (XANAX) 1 MG tablet  Sinusitis, acute - Plan: amoxicillin-clavulanate (AUGMENTIN) 875-125 MG per tablet  Treat for sinusitis with augmentin.  Refilled his usual xanax. Will plan further follow- up pending labs.   Signed Abbe Amsterdam, MD  Called 09/21/13: left detailed message.  Hope that he is better, please call me if not.  His CMP looks ok assuming he was not fasting. His hg is down and MCV is up.  B12 and folate are ok.  Please come by for a lab visit only in a couple of weeks to recheck.

## 2013-09-17 NOTE — Patient Instructions (Addendum)
Take the augmentin for a sinus infection.   Please continue to work on quitting smoking.   Please let me know if you are not better in the next few days.    You tested negative for the flu today.

## 2013-09-18 LAB — FOLATE: Folate: 15.9 ng/mL

## 2013-09-21 ENCOUNTER — Encounter: Payer: Self-pay | Admitting: Family Medicine

## 2013-09-21 NOTE — Addendum Note (Signed)
Addended by: Abbe Amsterdam C on: 09/21/2013 11:46 AM   Modules accepted: Orders

## 2013-09-30 ENCOUNTER — Other Ambulatory Visit (INDEPENDENT_AMBULATORY_CARE_PROVIDER_SITE_OTHER): Payer: Managed Care, Other (non HMO) | Admitting: Radiology

## 2013-09-30 DIAGNOSIS — R5381 Other malaise: Secondary | ICD-10-CM

## 2013-09-30 LAB — POCT CBC
Granulocyte percent: 67.6 %G (ref 37–80)
Hemoglobin: 14.2 g/dL (ref 14.1–18.1)
MCH, POC: 34.1 pg — AB (ref 27–31.2)
MCV: 105.5 fL — AB (ref 80–97)
MPV: 8.7 fL (ref 0–99.8)
RBC: 4.17 M/uL — AB (ref 4.69–6.13)
WBC: 6.2 10*3/uL (ref 4.6–10.2)

## 2013-09-30 NOTE — Progress Notes (Signed)
Labs drawn

## 2013-12-09 ENCOUNTER — Ambulatory Visit (INDEPENDENT_AMBULATORY_CARE_PROVIDER_SITE_OTHER): Payer: Managed Care, Other (non HMO) | Admitting: Family Medicine

## 2013-12-09 VITALS — BP 144/88 | HR 103 | Temp 98.3°F | Resp 18 | Ht 71.0 in | Wt 182.0 lb

## 2013-12-09 DIAGNOSIS — L989 Disorder of the skin and subcutaneous tissue, unspecified: Secondary | ICD-10-CM

## 2013-12-09 DIAGNOSIS — L299 Pruritus, unspecified: Secondary | ICD-10-CM

## 2013-12-09 LAB — POCT SKIN KOH: Skin KOH, POC: NEGATIVE

## 2013-12-09 MED ORDER — TRIAMCINOLONE ACETONIDE 0.1 % EX CREA: 1.0000 "application " | TOPICAL_CREAM | Freq: Two times a day (BID) | CUTANEOUS | Status: DC

## 2013-12-09 MED ORDER — PREDNISONE 20 MG PO TABS: ORAL_TABLET | ORAL | Status: DC

## 2013-12-09 NOTE — Patient Instructions (Signed)
Use the cream twice a day on your skin lesions.  Also, use the prednisone as directed.   Let me know if your skin lesions do not clear up or if they come back

## 2013-12-09 NOTE — Progress Notes (Signed)
[facility] [address] [place] [address] [phone]  Date:  12/09/2013   Name:  Jonathan Harrington   DOB:  [birth year]   MRN:  [id number]  PCP:  No primary provider on file.    Chief Complaint: Insect Bite   History of Present Illness:  Jonathan Harrington is a 57 y.o. very pleasant male patient who presents with the following:  He is here today with possible insect bites.  About 3 weeks ago he awoke one morning with "bites" all over his legs, some on his arms and some on his back.  He thought these must be bed bug bites.   He treated his home with bombs for bed bugs.  He did not get any new bites, but the bites he has have not healed.  He is using cortisone and alcohol.  The cortisone seems to help temporarily, but the itching comes right back.  He has now developed circular, scaly lesions over his legs.  He does scratch them quite a bit, especially at night  His wife never got these.  She has been fine.  They do not have any pets Otherwise he has felt well.  No fever.  He has not had a ST.    He has used prednisone in the past when he had gout and did well He is not taking any ibuprofen, aleve, etc.    Patient Active Problem List   Diagnosis Date Noted  . Anxiety state, unspecified 09/17/2013  . Gout 01/17/2013  . NONSPEC ABN FINDNG RAD & OTH EXAM ABDOMINAL AREA 09/16/2010  . DIVERTICULOSIS-COLON 08/13/2010  . DIVERTICULITIS, COLON 08/13/2010  . ABDOMINAL PAIN-LLQ 08/13/2010  . Nonspecific (abnormal) findings on radiological and other examination of body structure 08/13/2010  . PERSONAL HX COLONIC POLYPS 08/13/2010  . NONSPCIFC ABN FINDING RAD & OTH EXAM LUNG FIELD 08/13/2010    Past Medical History  Diagnosis Date  . Hypertension   . Diverticulitis 2011    resulted in partial colectomy  . Hematuria   . Anxiety     Past Surgical History  Procedure Laterality Date  . Colon surgery  2011    partial colectomy    History  Substance Use Topics   . Smoking status: Current Every Day Smoker -- 1.50 packs/day  . Smokeless tobacco: Never Used  . Alcohol Use: 12.6 oz/week    21 Cans of beer per week     Comment: about 3 beers daily- history of heavy liquor abuse    History reviewed. No pertinent family history.  Allergies  Allergen Reactions  . Ibuprofen Other (See Comments)    Unknown reaction    Medication list has been reviewed and updated.  Current Outpatient Prescriptions on File Prior to Visit  Medication Sig Dispense Refill  . allopurinol (ZYLOPRIM) 300 MG tablet TAKE 1 TABLET BY MOUTH DAILY  30 tablet  3  . ALPRAZolam (XANAX) 1 MG tablet Take 1 tablet (1 mg total) by mouth 2 (two) times daily.  180 tablet  1  . Multiple Vitamins-Minerals (MULTIVITAMIN WITH MINERALS) tablet Take 1 tablet by mouth daily.      Marland Kitchen amoxicillin-clavulanate (AUGMENTIN) 875-125 MG per tablet Take 1 tablet by mouth 2 (two) times daily.  20 tablet  0  . HYDROcodone-acetaminophen (NORCO) 10-325 MG per tablet Take 1 tablet by mouth every 6 (six) hours as needed.  30 tablet  0   No current facility-administered medications on file prior to visit.  Review of Systems:  As per HPI- otherwise negative. Denies any recent sore throat No CP, no SOB   Physical Examination: Filed Vitals:   12/09/13 1018  BP: 144/88  Pulse: 107  Temp: 98.3 F (36.8 C)  Resp: 18   Filed Vitals:   12/09/13 1018  Height: 5\' 11"  (1.803 m)  Weight: 182 lb (82.555 kg)   Body mass index is 25.4 kg/(m^2). Ideal Body Weight: Weight in (lb) to have BMI = 25: 178.9  GEN: WDWN, NAD, Non-toxic, A & O x 3 HEENT: Atraumatic, Normocephalic. Neck supple. No masses, No LAD.  No oral lesions Ears and Nose: No external deformity. CV: RRR, No M/G/R. No JVD. No thrill. No extra heart sounds. PULM: CTA B, no wheezes, crackles, rhonchi. No retractions. No resp. distress. No accessory muscle use. EXTR: No c/c/e NEURO Normal gait.  PSYCH: Normally interactive. Conversant. Not  depressed or anxious appearing.  Calm demeanor.  He has well- circumscribed lesions over his legs and a few on his trunk.  They are scaly, red, and appear most consistent with psoriasis type lesions.  He has larger plaque type lesions over his lateral ankles.    Wt Readings from Last 3 Encounters:  12/09/13 182 lb (82.555 kg)  09/17/13 185 lb (83.915 kg)  01/17/13 181 lb (82.101 kg)   Results for orders placed in visit on 12/09/13  POCT SKIN KOH      Result Value Range   Skin KOH, POC Negative      Assessment and Plan: Skin lesion - Plan: POCT Skin KOH, predniSONE (DELTASONE) 20 MG tablet, triamcinolone cream (KENALOG) 0.1 %  Itching  Jonathan Harrington seems to have a psoriasis type skin outbreak, perhaps triggered by bites.  However as his wife never had these bites I am more doubtful that they ever had bed bugs.  Will try and clear his skin with a prednisone taper and triamcinolone prn.  However cautioned him that we will need to consider a bx if these lesions come back to pinpoint the cause.    Signed Abbe Amsterdam, MD

## 2013-12-17 ENCOUNTER — Other Ambulatory Visit: Payer: Self-pay | Admitting: Physician Assistant

## 2013-12-17 ENCOUNTER — Other Ambulatory Visit: Payer: Self-pay | Admitting: Family Medicine

## 2013-12-27 ENCOUNTER — Telehealth: Payer: Self-pay

## 2013-12-27 NOTE — Telephone Encounter (Signed)
Pt states pharmacy told him he needs to see his dr before refilling his gout medicine. Pt was upset that he "has to see a dr to refill a medicine I will be on for the rest of my life." He states its been a year since he's seen the dr for that reason, but has seen the dr recently for unrelated problems and didn't know he should have asked for a refill at that visit. "I didn't know i was going to run out"  Pt requested to leave a message rather than having a walk in visit (he walked in to 102 and gave this message)  Pt sees Dr Patsy Lageropland.  Please call pt  bf

## 2013-12-28 MED ORDER — ALLOPURINOL 300 MG PO TABS: ORAL_TABLET | ORAL | Status: DC

## 2013-12-28 NOTE — Telephone Encounter (Signed)
Spoke to pt. Cleared up his confusion on the appt. He understands he needs regular check ups to maintain refills. Pt will call Monday to schedule an appt with Dr. Patsy Lageropland. I have refilled 30 days for him. He is aware.

## 2014-01-21 ENCOUNTER — Encounter: Payer: Self-pay | Admitting: Family Medicine

## 2014-01-21 ENCOUNTER — Ambulatory Visit (INDEPENDENT_AMBULATORY_CARE_PROVIDER_SITE_OTHER): Payer: Managed Care, Other (non HMO) | Admitting: Family Medicine

## 2014-01-21 VITALS — BP 140/84 | HR 96 | Temp 97.9°F | Resp 18 | Ht 71.0 in | Wt 183.0 lb

## 2014-01-21 DIAGNOSIS — R7309 Other abnormal glucose: Secondary | ICD-10-CM

## 2014-01-21 DIAGNOSIS — L989 Disorder of the skin and subcutaneous tissue, unspecified: Secondary | ICD-10-CM

## 2014-01-21 DIAGNOSIS — M109 Gout, unspecified: Secondary | ICD-10-CM

## 2014-01-21 DIAGNOSIS — L299 Pruritus, unspecified: Secondary | ICD-10-CM

## 2014-01-21 LAB — COMPREHENSIVE METABOLIC PANEL
ALT: 24 U/L (ref 0–53)
AST: 31 U/L (ref 0–37)
Albumin: 3.9 g/dL (ref 3.5–5.2)
Alkaline Phosphatase: 53 U/L (ref 39–117)
BUN: 9 mg/dL (ref 6–23)
CALCIUM: 8.9 mg/dL (ref 8.4–10.5)
CHLORIDE: 102 meq/L (ref 96–112)
CO2: 27 mEq/L (ref 19–32)
CREATININE: 0.9 mg/dL (ref 0.50–1.35)
Glucose, Bld: 162 mg/dL — ABNORMAL HIGH (ref 70–99)
Potassium: 4.1 mEq/L (ref 3.5–5.3)
Sodium: 138 mEq/L (ref 135–145)
Total Bilirubin: 0.7 mg/dL (ref 0.2–1.2)
Total Protein: 7.1 g/dL (ref 6.0–8.3)

## 2014-01-21 LAB — URIC ACID: Uric Acid, Serum: 4.7 mg/dL (ref 4.0–7.8)

## 2014-01-21 MED ORDER — TRIAMCINOLONE ACETONIDE 0.1 % EX CREA: 1.0000 "application " | TOPICAL_CREAM | Freq: Two times a day (BID) | CUTANEOUS | Status: DC

## 2014-01-21 MED ORDER — ALLOPURINOL 300 MG PO TABS: ORAL_TABLET | ORAL | Status: DC

## 2014-01-21 NOTE — Progress Notes (Addendum)
[facility] [address], [place] [address] [phone]  Date:  01/21/2014   Name:  Jonathan Harrington   DOB:  1956-05-07   MRN:  [id number]  PCP:  No PCP Per Patient    Chief Complaint: rx refills and bug bites   History of Present Illness:  Jonathan Harrington is a 58 y.o. very pleasant male patient who presents with the following:  For about 3 months now he has had itchy "bites" on his legs and body.  Just after Christmas we treated this with prednisone- however this did not seem to help much.  We also tried triamcinolone cream which does help some with his sx but has not made the rash go away- he ran out 2 days ago.  The rash is very itchy- "I will scratch it till it bleeds."  He cannot think of anything new that he might have been exposed to.  His wife still does NOT have this problem.    He does need a RF of his allupurinol today as well- he is doing well from a gout standpoint and has really not noted any problems since he has been on allupirinol  Otherwise he is feeling well, no particualar concerns today  Patient Active Problem List   Diagnosis Date Noted  . Anxiety state, unspecified 09/17/2013  . Gout 01/17/2013  . NONSPEC ABN FINDNG RAD & OTH EXAM ABDOMINAL AREA 09/16/2010  . DIVERTICULOSIS-COLON 08/13/2010  . DIVERTICULITIS, COLON 08/13/2010  . ABDOMINAL PAIN-LLQ 08/13/2010  . Nonspecific (abnormal) findings on radiological and other examination of body structure 08/13/2010  . PERSONAL HX COLONIC POLYPS 08/13/2010  . NONSPCIFC ABN FINDING RAD & OTH EXAM LUNG FIELD 08/13/2010    Past Medical History  Diagnosis Date  . Hypertension   . Diverticulitis 2011    resulted in partial colectomy  . Hematuria   . Anxiety     Past Surgical History  Procedure Laterality Date  . Colon surgery  2011    partial colectomy    History  Substance Use Topics  . Smoking status: Current Every Day Smoker -- 1.50 packs/day  . Smokeless tobacco: Never Used  .  Alcohol Use: 12.6 oz/week    21 Cans of beer per week     Comment: about 3 beers daily- history of heavy liquor abuse    History reviewed. No pertinent family history.  Allergies  Allergen Reactions  . Ibuprofen Other (See Comments)    Unknown reaction    Medication list has been reviewed and updated.  Current Outpatient Prescriptions on File Prior to Visit  Medication Sig Dispense Refill  . allopurinol (ZYLOPRIM) 300 MG tablet TAKE 1 TABLET BY MOUTH DAILY  30 tablet  0  . ALPRAZolam (XANAX) 1 MG tablet Take 1 tablet (1 mg total) by mouth 2 (two) times daily.  180 tablet  1  . Multiple Vitamins-Minerals (MULTIVITAMIN WITH MINERALS) tablet Take 1 tablet by mouth daily.      Marland Kitchen HYDROcodone-acetaminophen (NORCO) 10-325 MG per tablet Take 1 tablet by mouth every 6 (six) hours as needed.  30 tablet  0  . predniSONE (DELTASONE) 20 MG tablet Take 2 pills a day for 3 days, then 1 pill a day for 3 days  9 tablet  0  . triamcinolone cream (KENALOG) 0.1 % Apply 1 application topically 2 (two) times daily.  90 g  0   No current facility-administered medications on file prior to visit.    Review of  Systems:  As per HPI- otherwise negative.   Physical Examination: Filed Vitals:   01/21/14 0755  BP: 140/84  Pulse: 116  Temp: 97.9 F (36.6 C)  Resp: 18   Filed Vitals:   01/21/14 0755  Height: 5\' 11"  (1.803 m)  Weight: 183 lb (83.008 kg)   Body mass index is 25.53 kg/(m^2). Ideal Body Weight: Weight in (lb) to have BMI = 25: 178.9  GEN: WDWN, NAD, Non-toxic, A & O x 3, looks well HEENT: Atraumatic, Normocephalic. Neck supple. No masses, No LAD. Ears and Nose: No external deformity. CV: RRR, No M/G/R. No JVD. No thrill. No extra heart sounds. PULM: CTA B, no wheezes, crackles, rhonchi. No retractions. No resp. distress. No accessory muscle use. EXTR: No c/c/e NEURO Normal gait.  PSYCH: Normally interactive. Conversant. Not depressed or anxious appearing.  Calm demeanor.  Skin:  he has a mildly excoriated rash in patches. Most on his right ankle. Is mild scaly, consistent with possible eczema or psoriasis.  He has a few healing patches on his lower back No rash or itching in his hands/ fingers or groin  Assessment and Plan: Itching - Plan: Comprehensive metabolic panel, Dermatology pathology  Gout - Plan: allopurinol (ZYLOPRIM) 300 MG tablet, Uric Acid  Skin lesion - Plan: triamcinolone cream (KENALOG) 0.1 %  persistent itchy rash.  Punch bx taken today, await results Refilled his allopurinol.  Await uric acid level CMP pending as well Will plan further follow- up pending labs.  Signed Abbe AmsterdamJessica Temekia Caskey, MD   2/16; called and LMOM.  Spongiotic dermatitis- this means eczema and similar.  Continue triamcinolone as needed, and can also try a heavy moisturizer such as aquaphor.  Avoid long hot showers. Let me know if not better- will probably get better as the weather warms.

## 2014-01-21 NOTE — Patient Instructions (Signed)
WOUND CARE Please return in 5-7 days to have your stitches/staples removed or sooner if you have concerns. Marland Kitchen. Keep area clean and dry for 24 hours. Do not remove bandage, if applied. . After 24 hours, remove bandage and wash wound gently with mild soap and warm water. Reapply a new bandage after cleaning wound, if directed. . Continue daily cleansing with soap and water until stitches/staples are removed. . Do not apply any ointments or creams to the wound while stitches/staples are in place, as this may cause delayed healing. . Notify the office if you experience any of the following signs of infection: Swelling, redness, pus drainage, streaking, fever >101.0 F . Notify the office if you experience excessive bleeding that does not stop after 15-20 minutes of constant, firm  I will be in touch regarding your other labs and your skin pathology report.  pressure.

## 2014-01-21 NOTE — Addendum Note (Signed)
Addended by: Abbe AmsterdamOPLAND, Tichina Koebel C on: 01/21/2014 08:51 PM   Modules accepted: Orders
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
## 2014-02-19 ENCOUNTER — Ambulatory Visit (INDEPENDENT_AMBULATORY_CARE_PROVIDER_SITE_OTHER): Payer: Managed Care, Other (non HMO) | Admitting: Family Medicine

## 2014-02-19 VITALS — BP 136/76 | HR 106 | Temp 98.6°F | Resp 16 | Ht 70.75 in | Wt 181.0 lb

## 2014-02-19 DIAGNOSIS — Z23 Encounter for immunization: Secondary | ICD-10-CM

## 2014-02-19 DIAGNOSIS — M109 Gout, unspecified: Secondary | ICD-10-CM

## 2014-02-19 DIAGNOSIS — M10072 Idiopathic gout, left ankle and foot: Secondary | ICD-10-CM

## 2014-02-19 DIAGNOSIS — T23001A Burn of unspecified degree of right hand, unspecified site, initial encounter: Secondary | ICD-10-CM

## 2014-02-19 MED ORDER — COLCHICINE 0.6 MG PO TABS: ORAL_TABLET | ORAL | Status: DC

## 2014-02-19 MED ORDER — HYDROCODONE-ACETAMINOPHEN 5-325 MG PO TABS: 1.0000 | ORAL_TABLET | Freq: Three times a day (TID) | ORAL | Status: DC | PRN
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
## 2014-02-19 NOTE — Patient Instructions (Signed)
Use the pain medication as needed for your gout flare.  Remember do not use this with alcohol or xanax.  Use the colchicine as directed for the gout; take 3 pills today. You can take 1 or 2 for the next few days as needed also.

## 2014-03-10 ENCOUNTER — Encounter: Payer: Managed Care, Other (non HMO) | Admitting: Family Medicine

## 2014-03-26 ENCOUNTER — Telehealth: Payer: Self-pay

## 2014-03-26 DIAGNOSIS — F411 Generalized anxiety disorder: Secondary | ICD-10-CM

## 2014-03-26 MED ORDER — ALPRAZOLAM 1 MG PO TABS
1.0000 mg | ORAL_TABLET | Freq: Two times a day (BID) | ORAL | Status: DC
Start: 2014-03-26 — End: 2014-08-27

## 2014-03-26 NOTE — Telephone Encounter (Signed)
Pt is needing a refill on his zanax

## 2014-04-15 ENCOUNTER — Telehealth: Payer: Self-pay

## 2014-04-15 NOTE — Telephone Encounter (Signed)
This is a duplicate message.  Original sent to Dr. Patsy Lageropland.
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
## 2014-04-15 NOTE — Telephone Encounter (Signed)
Telephone call from patient's niece concerned about patient.  She states he and his wife are are abusing their xanax and mixing it with alcohol.  Please advise.    Byrd HesselbachMaria [phone]

## 2014-04-16 NOTE — Telephone Encounter (Signed)
Called and got Jonathan Harrington's VM.  I do not believe she is on their Hippa form so unfortunately I cannot speak to her directly about this matter.  However if these behaviors are going I am would be concerned as well and would like to see them for a recheck.  If she would be able to speak to them and suggest that they come in and see me that would be great.  Also, if she needs me to call them and ask them to come in I can do this- please let me know

## 2014-05-08 ENCOUNTER — Ambulatory Visit: Payer: Managed Care, Other (non HMO) | Admitting: Family Medicine

## 2014-05-08 VITALS — BP 136/76 | HR 90 | Temp 98.5°F | Resp 16 | Ht 70.0 in | Wt 181.4 lb

## 2014-05-08 DIAGNOSIS — K4090 Unilateral inguinal hernia, without obstruction or gangrene, not specified as recurrent: Secondary | ICD-10-CM

## 2014-05-08 NOTE — Patient Instructions (Signed)
You have a left inguinal hernia. I am going to refer you to general surgery to discuss having this fixed.  In the meantime if you have pain lie or sit back and gently push the bump back in.  However if you have persistent or severe pain this could mean that your hernia is "strangulated."  In this case you need to go to the ER right away.

## 2014-05-08 NOTE — Progress Notes (Signed)
[facility] [address], [place] [address] [phone]  Date:  05/08/2014   Name:  Jonathan Harrington   DOB:  04/17/1956   MRN:  [id number]  PCP:  Abbe AmsterdamOPLAND,Reynold Mantell, MD    Chief Complaint: Groin Pain   History of Present Illness:  Jonathan Harrington is a 58 y.o. very pleasant male patient who presents with the following:  He has noted a left inguinal pain for about 1 week.  He will feel a "little knot" when the pain comes. If he sits and relaxes the pain will go away.  He has noted sx up to 2 or 3 x a day. He has not tried lifting much- walking can make the sx worse.   No nausea or vomiting.   No dysuria, no hematuria.  It is not testicular pain.   He has never had a hernia as far as he knows.    History of partial colectomy in 2011.   He is eating normally.    Patient Active Problem List   Diagnosis Date Noted  . Anxiety state, unspecified 09/17/2013  . Gout 01/17/2013  . NONSPEC ABN FINDNG RAD & OTH EXAM ABDOMINAL AREA 09/16/2010  . DIVERTICULOSIS-COLON 08/13/2010  . DIVERTICULITIS, COLON 08/13/2010  . ABDOMINAL PAIN-LLQ 08/13/2010  . Nonspecific (abnormal) findings on radiological and other examination of body structure 08/13/2010  . PERSONAL HX COLONIC POLYPS 08/13/2010  . NONSPCIFC ABN FINDING RAD & OTH EXAM LUNG FIELD 08/13/2010    Past Medical History  Diagnosis Date  . Hypertension   . Diverticulitis 2011    resulted in partial colectomy  . Hematuria   . Anxiety     Past Surgical History  Procedure Laterality Date  . Colon surgery  2011    partial colectomy    History  Substance Use Topics  . Smoking status: Current Every Day Smoker -- 1.50 packs/day  . Smokeless tobacco: Never Used  . Alcohol Use: 12.6 oz/week    21 Cans of beer per week     Comment: about 3 beers daily- history of heavy liquor abuse    No family history on file.  Allergies  Allergen Reactions  . Ibuprofen Other (See Comments)    Unknown reaction     Medication list has been reviewed and updated.  Current Outpatient Prescriptions on File Prior to Visit  Medication Sig Dispense Refill  . allopurinol (ZYLOPRIM) 300 MG tablet TAKE 1 TABLET BY MOUTH DAILY  90 tablet  3  . ALPRAZolam (XANAX) 1 MG tablet Take 1 tablet (1 mg total) by mouth 2 (two) times daily.  180 tablet  0  . colchicine 0.6 MG tablet Take 2 pills, then take 1 an hour later.  May take 1 or 2 a day as needed for gout prevention  30 tablet  0  . Multiple Vitamins-Minerals (MULTIVITAMIN WITH MINERALS) tablet Take 1 tablet by mouth daily.      Marland Kitchen. triamcinolone cream (KENALOG) 0.1 % Apply 1 application topically 2 (two) times daily.  90 g  2  . HYDROcodone-acetaminophen (NORCO/VICODIN) 5-325 MG per tablet Take 1 tablet by mouth every 8 (eight) hours as needed.  10 tablet  0  . predniSONE (DELTASONE) 20 MG tablet Take 2 pills a day for 3 days, then 1 pill a day for 3 days  9 tablet  0   No current facility-administered medications on file prior to visit.    Review of Systems:  As per HPI- otherwise negative.  Physical Examination: Filed Vitals:   05/08/14 1345  BP: 136/76  Pulse: 90  Temp: 98.5 F (36.9 C)  Resp: 16   Filed Vitals:   05/08/14 1345  Height: 5\' 10"  (1.778 m)  Weight: 181 lb 6.4 oz (82.283 kg)   Body mass index is 26.03 kg/(m^2). Ideal Body Weight: Weight in (lb) to have BMI = 25: 173.9  GEN: WDWN, NAD, Non-toxic, A & O x 3, looks well HEENT: Atraumatic, Normocephalic. Neck supple. No masses, No LAD. Ears and Nose: No external deformity. CV: RRR, No M/G/R. No JVD. No thrill. No extra heart sounds. PULM: CTA B, no wheezes, crackles, rhonchi. No retractions. No resp. distress. No accessory muscle use. ABD: S, NT, ND, +BS. No rebound. No HSM. EXTR: No c/c/e NEURO Normal gait.  PSYCH: Normally interactive. Conversant. Not depressed or anxious appearing.  Calm demeanor.  Gu: he has a left inguinal hernia that is present with cough and easily  reducible.  Right side ok.  No testicular or penile pain or masses,     Assessment and Plan: Left inguinal hernia - Plan: Ambulatory referral to General Surgery  Inguinal hernia.  Will refer to general surgery for evaluation.  Discussed signs and sx of strangulation- See patient instructions for more details.     Signed Abbe Amsterdam, MD

## 2014-05-21 ENCOUNTER — Encounter: Payer: Self-pay | Admitting: Internal Medicine

## 2014-05-23 ENCOUNTER — Other Ambulatory Visit: Payer: Self-pay

## 2014-05-23 ENCOUNTER — Encounter (HOSPITAL_BASED_OUTPATIENT_CLINIC_OR_DEPARTMENT_OTHER)
Admission: RE | Admit: 2014-05-23 | Discharge: 2014-05-23 | Disposition: A | Discharge status: Home / Self Care | Payer: Managed Care, Other (non HMO) | Source: Ambulatory Visit | Attending: Surgery | Admitting: Surgery

## 2014-05-23 ENCOUNTER — Ambulatory Visit (INDEPENDENT_AMBULATORY_CARE_PROVIDER_SITE_OTHER): Payer: Managed Care, Other (non HMO) | Admitting: Surgery

## 2014-05-23 ENCOUNTER — Encounter (INDEPENDENT_AMBULATORY_CARE_PROVIDER_SITE_OTHER): Payer: Self-pay | Admitting: Surgery

## 2014-05-23 ENCOUNTER — Encounter (HOSPITAL_BASED_OUTPATIENT_CLINIC_OR_DEPARTMENT_OTHER): Payer: Self-pay | Admitting: *Deleted

## 2014-05-23 VITALS — BP 126/77 | HR 69 | Temp 99.4°F | Resp 18 | Ht 68.0 in | Wt 186.0 lb

## 2014-05-23 DIAGNOSIS — Z01812 Encounter for preprocedural laboratory examination: Secondary | ICD-10-CM | POA: Insufficient documentation

## 2014-05-23 DIAGNOSIS — K4090 Unilateral inguinal hernia, without obstruction or gangrene, not specified as recurrent: Secondary | ICD-10-CM | POA: Insufficient documentation

## 2014-05-23 DIAGNOSIS — Z01818 Encounter for other preprocedural examination: Secondary | ICD-10-CM | POA: Insufficient documentation

## 2014-05-23 LAB — BASIC METABOLIC PANEL
BUN: 7 mg/dL (ref 6–23)
CHLORIDE: 103 meq/L (ref 96–112)
CO2: 25 mEq/L (ref 19–32)
Calcium: 9.8 mg/dL (ref 8.4–10.5)
Creatinine, Ser: 0.9 mg/dL (ref 0.50–1.35)
GFR calc Af Amer: >90 mL/min (ref >90)
Glucose, Bld: 102 mg/dL — ABNORMAL HIGH (ref 70–99)
POTASSIUM: 5.2 meq/L (ref 3.7–5.3)
SODIUM: 141 meq/L (ref 137–147)

## 2014-05-23 NOTE — Progress Notes (Signed)
Patient ID: Jonathan Harrington, male   DOB: 09/01/1956, 58 y.o.   MRN: [id number]  Chief Complaint  Patient presents with  . Hernia    HPI Jonathan Harrington is a 58 y.o. male.  Referred by Dr. Warner MccreedyJessica Copeland for evaluation of left inguinal hernia  HPI 58 yo male s/p sigmoid colectomy in 2011 for diverticulitis presents with a one-month history of bulging and discomfort in the left groin.  This remains reducible.  No obstructive symptoms.  The patient's job involves lifting no more than 10 lbs and he is sitting most of the time.  He smokes 1.0-1.5 packs per day.  Past Medical History  Diagnosis Date  . Hypertension   . Diverticulitis 2011    resulted in partial colectomy  . Hematuria   . Anxiety     Past Surgical History  Procedure Laterality Date  . Colon surgery  2011    partial colectomy    History reviewed. No pertinent family history.  Social History History  Substance Use Topics  . Smoking status: Current Every Day Smoker -- 1.50 packs/day  . Smokeless tobacco: Never Used  . Alcohol Use: 12.6 oz/week    21 Cans of beer per week     Comment: about 3 beers daily- history of heavy liquor abuse    Allergies  Allergen Reactions  . Ibuprofen Other (See Comments)    Unknown reaction    Current Outpatient Prescriptions  Medication Sig Dispense Refill  . allopurinol (ZYLOPRIM) 300 MG tablet TAKE 1 TABLET BY MOUTH DAILY  90 tablet  3  . ALPRAZolam (XANAX) 1 MG tablet Take 1 tablet (1 mg total) by mouth 2 (two) times daily.  180 tablet  0  . colchicine 0.6 MG tablet Take 2 pills, then take 1 an hour later.  May take 1 or 2 a day as needed for gout prevention  30 tablet  0  . HYDROcodone-acetaminophen (NORCO/VICODIN) 5-325 MG per tablet Take 1 tablet by mouth every 8 (eight) hours as needed.  10 tablet  0  . Multiple Vitamins-Minerals (MULTIVITAMIN WITH MINERALS) tablet Take 1 tablet by mouth daily.      Marland Kitchen. triamcinolone cream (KENALOG) 0.1 % Apply 1 application topically 2  (two) times daily.  90 g  2   No current facility-administered medications for this visit.    Review of Systems Review of Systems  Constitutional: Negative for fever, chills and unexpected weight change.  HENT: Negative for congestion, hearing loss, sore throat, trouble swallowing and voice change.   Eyes: Negative for visual disturbance.  Respiratory: Negative for cough and wheezing.   Cardiovascular: Negative for chest pain, palpitations and leg swelling.  Gastrointestinal: Negative for nausea, vomiting, abdominal pain, diarrhea, constipation, blood in stool, abdominal distention, anal bleeding and rectal pain.  Genitourinary: Positive for testicular pain. Negative for hematuria and difficulty urinating.  Musculoskeletal: Negative for arthralgias.  Skin: Negative for rash and wound.  Neurological: Negative for seizures, syncope, weakness and headaches.  Hematological: Negative for adenopathy. Does not bruise/bleed easily.  Psychiatric/Behavioral: Negative for confusion.    Blood pressure 126/77, pulse 69, temperature 99.4 F (37.4 C), temperature source Temporal, resp. rate 18, height 5\' 8"  (1.727 m), weight 186 lb (84.369 kg).  Physical Exam Physical Exam WDWN in NAD - smells heavily of cigarettes HEENT:  EOMI, sclera anicteric Neck:  No masses, no thyromegaly Lungs:  CTA bilaterally; normal respiratory effort CV:  Regular rate and rhythm; no murmurs Abd:  +bowel sounds, soft, non-tender, no masses;  healed midline incision GU:  Bilateral descended testes; no testicular masses; visible reducible left inguinal hernia; no sign of right inguinal hernia Ext:  Well-perfused; no edema Skin:  Warm, dry; no sign of jaundice  Data Reviewed none  Assessment    Left inguinal hernia - reducible     Plan    Left inguinal hernia repair with mesh.  The surgical procedure has been discussed with the patient.  Potential risks, benefits, alternative treatments, and expected outcomes  have been explained.  All of the patient's questions at this time have been answered.  The likelihood of reaching the patient's treatment goal is good.  The patient understand the proposed surgical procedure and wishes to proceed.         Jalisha Enneking K. 05/23/2014, 10:39 AM    

## 2014-05-23 NOTE — Progress Notes (Signed)
To come for bmet-ekg-was on htn meds-is on allopurinol-pcp has dx htn

## 2014-05-26 ENCOUNTER — Encounter (HOSPITAL_BASED_OUTPATIENT_CLINIC_OR_DEPARTMENT_OTHER): Payer: Self-pay | Admitting: *Deleted

## 2014-05-27 ENCOUNTER — Ambulatory Visit (HOSPITAL_BASED_OUTPATIENT_CLINIC_OR_DEPARTMENT_OTHER): Payer: Managed Care, Other (non HMO) | Admitting: Anesthesiology

## 2014-05-27 ENCOUNTER — Encounter (HOSPITAL_BASED_OUTPATIENT_CLINIC_OR_DEPARTMENT_OTHER): Payer: Self-pay | Admitting: Anesthesiology

## 2014-05-27 ENCOUNTER — Encounter (HOSPITAL_BASED_OUTPATIENT_CLINIC_OR_DEPARTMENT_OTHER): Payer: Managed Care, Other (non HMO) | Admitting: Anesthesiology

## 2014-05-27 ENCOUNTER — Ambulatory Visit (HOSPITAL_BASED_OUTPATIENT_CLINIC_OR_DEPARTMENT_OTHER)
Admission: RE | Admit: 2014-05-27 | Discharge: 2014-05-27 | Disposition: A | Discharge status: Home / Self Care | Payer: Managed Care, Other (non HMO) | Source: Ambulatory Visit | Attending: Surgery | Admitting: Surgery

## 2014-05-27 ENCOUNTER — Encounter (HOSPITAL_BASED_OUTPATIENT_CLINIC_OR_DEPARTMENT_OTHER)
Admission: RE | Disposition: A | Discharge status: Home / Self Care | Payer: Self-pay | Source: Ambulatory Visit | Attending: Surgery

## 2014-05-27 DIAGNOSIS — F17200 Nicotine dependence, unspecified, uncomplicated: Secondary | ICD-10-CM | POA: Insufficient documentation

## 2014-05-27 DIAGNOSIS — Z9049 Acquired absence of other specified parts of digestive tract: Secondary | ICD-10-CM | POA: Insufficient documentation

## 2014-05-27 DIAGNOSIS — Z79899 Other long term (current) drug therapy: Secondary | ICD-10-CM | POA: Insufficient documentation

## 2014-05-27 DIAGNOSIS — I10 Essential (primary) hypertension: Secondary | ICD-10-CM | POA: Insufficient documentation

## 2014-05-27 DIAGNOSIS — Z886 Allergy status to analgesic agent status: Secondary | ICD-10-CM | POA: Insufficient documentation

## 2014-05-27 DIAGNOSIS — K4090 Unilateral inguinal hernia, without obstruction or gangrene, not specified as recurrent: Secondary | ICD-10-CM | POA: Insufficient documentation

## 2014-05-27 DIAGNOSIS — F411 Generalized anxiety disorder: Secondary | ICD-10-CM | POA: Insufficient documentation

## 2014-05-27 HISTORY — DX: Presence of spectacles and contact lenses: Z97.3

## 2014-05-27 HISTORY — DX: Complete loss of teeth, unspecified cause, unspecified class: K08.109

## 2014-05-27 HISTORY — PX: INGUINAL HERNIA REPAIR: SHX194

## 2014-05-27 HISTORY — DX: Gout, unspecified: M10.9

## 2014-05-27 HISTORY — DX: Presence of dental prosthetic device (complete) (partial): Z97.2

## 2014-05-27 HISTORY — PX: INSERTION OF MESH: SHX5868

## 2014-05-27 SURGERY — REPAIR, HERNIA, INGUINAL, ADULT
Anesthesia: Regional | Site: Groin | Laterality: Left

## 2014-05-27 MED ORDER — HYDROMORPHONE HCL PF 1 MG/ML IJ SOLN
0.2500 mg | INTRAMUSCULAR | Status: DC | PRN
  Administered 2014-05-27 (×3): 0.5 mg via INTRAVENOUS

## 2014-05-27 MED ORDER — MIDAZOLAM HCL 5 MG/5ML IJ SOLN
INTRAMUSCULAR | Status: DC | PRN
  Administered 2014-05-27: 1 mg via INTRAVENOUS

## 2014-05-27 MED ORDER — FENTANYL CITRATE 0.05 MG/ML IJ SOLN
INTRAMUSCULAR | Status: AC
  Filled 2014-05-27: qty 6

## 2014-05-27 MED ORDER — OXYCODONE-ACETAMINOPHEN 5-325 MG PO TABS: 1.0000 | ORAL_TABLET | ORAL | Status: DC | PRN

## 2014-05-27 MED ORDER — BUPIVACAINE-EPINEPHRINE 0.25% -1:200000 IJ SOLN
INTRAMUSCULAR | Status: DC | PRN
  Administered 2014-05-27: 10 mL

## 2014-05-27 MED ORDER — DEXAMETHASONE SODIUM PHOSPHATE 4 MG/ML IJ SOLN
INTRAMUSCULAR | Status: DC | PRN
  Administered 2014-05-27: 10 mg via INTRAVENOUS

## 2014-05-27 MED ORDER — OXYCODONE HCL 5 MG/5ML PO SOLN
5.0000 mg | Freq: Once | ORAL | Status: AC | PRN
Start: 2014-05-27 — End: 2014-05-27

## 2014-05-27 MED ORDER — LACTATED RINGERS IV SOLN
INTRAVENOUS | Status: DC
  Administered 2014-05-27 (×3): via INTRAVENOUS

## 2014-05-27 MED ORDER — MIDAZOLAM HCL 2 MG/2ML IJ SOLN
INTRAMUSCULAR | Status: AC
  Filled 2014-05-27: qty 2

## 2014-05-27 MED ORDER — BUPIVACAINE-EPINEPHRINE (PF) 0.25% -1:200000 IJ SOLN
INTRAMUSCULAR | Status: AC
  Filled 2014-05-27: qty 60

## 2014-05-27 MED ORDER — CHLORHEXIDINE GLUCONATE 4 % EX LIQD: 1.0000 "application " | Freq: Once | CUTANEOUS | Status: DC

## 2014-05-27 MED ORDER — OXYCODONE HCL 5 MG PO TABS
ORAL_TABLET | ORAL | Status: AC
  Filled 2014-05-27: qty 1

## 2014-05-27 MED ORDER — HYDROMORPHONE HCL PF 1 MG/ML IJ SOLN
INTRAMUSCULAR | Status: AC
  Filled 2014-05-27: qty 1

## 2014-05-27 MED ORDER — MORPHINE SULFATE 2 MG/ML IJ SOLN: 2.0000 mg | INTRAMUSCULAR | Status: DC | PRN

## 2014-05-27 MED ORDER — LIDOCAINE HCL (CARDIAC) 20 MG/ML IV SOLN
INTRAVENOUS | Status: DC | PRN
  Administered 2014-05-27: 100 mg via INTRAVENOUS

## 2014-05-27 MED ORDER — FENTANYL CITRATE 0.05 MG/ML IJ SOLN
50.0000 ug | INTRAMUSCULAR | Status: DC | PRN
  Administered 2014-05-27: 100 ug via INTRAVENOUS

## 2014-05-27 MED ORDER — OXYCODONE HCL 5 MG PO TABS
5.0000 mg | ORAL_TABLET | Freq: Once | ORAL | Status: AC | PRN
  Administered 2014-05-27: 5 mg via ORAL

## 2014-05-27 MED ORDER — MEPERIDINE HCL 25 MG/ML IJ SOLN
6.2500 mg | INTRAMUSCULAR | Status: DC | PRN
Start: 2014-05-27 — End: 2014-05-27

## 2014-05-27 MED ORDER — CEFAZOLIN SODIUM-DEXTROSE 2-3 GM-% IV SOLR
INTRAVENOUS | Status: AC
  Filled 2014-05-27: qty 50

## 2014-05-27 MED ORDER — FENTANYL CITRATE 0.05 MG/ML IJ SOLN
INTRAMUSCULAR | Status: AC
  Filled 2014-05-27: qty 2

## 2014-05-27 MED ORDER — ONDANSETRON HCL 4 MG/2ML IJ SOLN
INTRAMUSCULAR | Status: DC | PRN
  Administered 2014-05-27: 4 mg via INTRAVENOUS

## 2014-05-27 MED ORDER — ONDANSETRON HCL 4 MG/2ML IJ SOLN: 4.0000 mg | Freq: Once | INTRAMUSCULAR | Status: DC | PRN

## 2014-05-27 MED ORDER — CEFAZOLIN SODIUM-DEXTROSE 2-3 GM-% IV SOLR
2.0000 g | INTRAVENOUS | Status: AC
  Administered 2014-05-27: 2 g via INTRAVENOUS

## 2014-05-27 MED ORDER — PROPOFOL 10 MG/ML IV BOLUS
INTRAVENOUS | Status: DC | PRN
  Administered 2014-05-27: 150 mg via INTRAVENOUS
  Administered 2014-05-27: 50 mg via INTRAVENOUS

## 2014-05-27 MED ORDER — FENTANYL CITRATE 0.05 MG/ML IJ SOLN
INTRAMUSCULAR | Status: DC | PRN
  Administered 2014-05-27: 25 ug via INTRAVENOUS
  Administered 2014-05-27 (×3): 50 ug via INTRAVENOUS
  Administered 2014-05-27: 25 ug via INTRAVENOUS

## 2014-05-27 MED ORDER — BUPIVACAINE-EPINEPHRINE (PF) 0.5% -1:200000 IJ SOLN
INTRAMUSCULAR | Status: DC | PRN
  Administered 2014-05-27: 30 mL via PERINEURAL

## 2014-05-27 MED ORDER — ONDANSETRON HCL 4 MG/2ML IJ SOLN: 4.0000 mg | INTRAMUSCULAR | Status: DC | PRN

## 2014-05-27 MED ORDER — MIDAZOLAM HCL 2 MG/2ML IJ SOLN
INTRAMUSCULAR | Status: AC
Start: 2014-05-27 — End: 2014-05-27
  Filled 2014-05-27: qty 2

## 2014-05-27 MED ORDER — MIDAZOLAM HCL 2 MG/2ML IJ SOLN
1.0000 mg | INTRAMUSCULAR | Status: DC | PRN
  Administered 2014-05-27: 2 mg via INTRAVENOUS

## 2014-05-27 SURGICAL SUPPLY — 49 items
BENZOIN TINCTURE PRP APPL 2/3 (GAUZE/BANDAGES/DRESSINGS) ×2 IMPLANT
BLADE HEX COATED 2.75 (ELECTRODE) ×2 IMPLANT
BLADE SURG 15 STRL LF DISP TIS (BLADE) ×2 IMPLANT
BLADE SURG 15 STRL SS (BLADE) ×2
BLADE SURG ROTATE 9660 (MISCELLANEOUS) ×2 IMPLANT
CANISTER SUCT 1200ML W/VALVE (MISCELLANEOUS) ×2 IMPLANT
CHLORAPREP W/TINT 26ML (MISCELLANEOUS) ×2 IMPLANT
COVER MAYO STAND STRL (DRAPES) ×2 IMPLANT
COVER TABLE BACK 60X90 (DRAPES) ×2 IMPLANT
DECANTER SPIKE VIAL GLASS SM (MISCELLANEOUS) ×2 IMPLANT
DRAIN PENROSE 1/2X12 LTX STRL (WOUND CARE) ×2 IMPLANT
DRAPE LAPAROTOMY TRNSV 102X78 (DRAPE) ×2 IMPLANT
DRAPE UTILITY XL STRL (DRAPES) ×2 IMPLANT
DRSG TEGADERM 4X4.75 (GAUZE/BANDAGES/DRESSINGS) ×2 IMPLANT
ELECT REM PT RETURN 9FT ADLT (ELECTROSURGICAL) ×2
ELECTRODE REM PT RTRN 9FT ADLT (ELECTROSURGICAL) ×1 IMPLANT
GLOVE BIO SURGEON STRL SZ7 (GLOVE) ×2 IMPLANT
GLOVE BIOGEL PI IND STRL 7.5 (GLOVE) ×1 IMPLANT
GLOVE BIOGEL PI INDICATOR 7.5 (GLOVE) ×1
GOWN STRL REUS W/ TWL LRG LVL3 (GOWN DISPOSABLE) ×2 IMPLANT
GOWN STRL REUS W/TWL LRG LVL3 (GOWN DISPOSABLE) ×2
MESH PARIETEX PROGRIP LEFT (Mesh General) ×2 IMPLANT
NEEDLE HYPO 25X1 1.5 SAFETY (NEEDLE) ×2 IMPLANT
NS IRRIG 1000ML POUR BTL (IV SOLUTION) IMPLANT
PACK BASIN DAY SURGERY FS (CUSTOM PROCEDURE TRAY) ×2 IMPLANT
PENCIL BUTTON HOLSTER BLD 10FT (ELECTRODE) ×2 IMPLANT
SLEEVE SCD COMPRESS KNEE MED (MISCELLANEOUS) ×2 IMPLANT
SPONGE GAUZE 4X4 12PLY STER LF (GAUZE/BANDAGES/DRESSINGS) ×2 IMPLANT
SPONGE INTESTINAL PEANUT (DISPOSABLE) ×2 IMPLANT
SPONGE LAP 18X18 X RAY DECT (DISPOSABLE) IMPLANT
STRIP CLOSURE SKIN 1/2X4 (GAUZE/BANDAGES/DRESSINGS) ×2 IMPLANT
SUT MON AB 4-0 PC3 18 (SUTURE) ×2 IMPLANT
SUT PDS AB 0 CT 36 (SUTURE) IMPLANT
SUT SILK 2 0 SH (SUTURE) IMPLANT
SUT SILK 3 0 SH 30 (SUTURE) IMPLANT
SUT SILK 3 0 TIES 17X18 (SUTURE)
SUT SILK 3-0 18XBRD TIE BLK (SUTURE) IMPLANT
SUT VIC AB 0 CT1 27 (SUTURE) ×1
SUT VIC AB 0 CT1 27XBRD ANBCTR (SUTURE) ×1 IMPLANT
SUT VIC AB 0 SH 27 (SUTURE) IMPLANT
SUT VIC AB 2-0 SH 27 (SUTURE) ×1
SUT VIC AB 2-0 SH 27XBRD (SUTURE) ×1 IMPLANT
SUT VIC AB 3-0 SH 27 (SUTURE) ×1
SUT VIC AB 3-0 SH 27X BRD (SUTURE) ×1 IMPLANT
SYR CONTROL 10ML LL (SYRINGE) ×2 IMPLANT
TOWEL OR 17X24 6PK STRL BLUE (TOWEL DISPOSABLE) ×2 IMPLANT
TOWEL OR NON WOVEN STRL DISP B (DISPOSABLE) ×2 IMPLANT
TUBE CONNECTING 20X1/4 (TUBING) ×2 IMPLANT
YANKAUER SUCT BULB TIP NO VENT (SUCTIONS) ×2 IMPLANT

## 2014-05-27 NOTE — Progress Notes (Signed)
Assisted Dr. Ossey with left, ultrasound guided, transabdominal plane block. Side rails up, monitors on throughout procedure. See vital signs in flow sheet. Tolerated Procedure well. 

## 2014-05-27 NOTE — H&P (View-Only) (Signed)
Patient ID: Jonathan Harrington, male   DOB: 09/01/1956, 58 y.o.   MRN: [id number]  Chief Complaint  Patient presents with  . Hernia    HPI Jonathan Harrington is a 58 y.o. male.  Referred by Dr. Warner MccreedyJessica Copeland for evaluation of left inguinal hernia  HPI 58 yo male s/p sigmoid colectomy in 2011 for diverticulitis presents with a one-month history of bulging and discomfort in the left groin.  This remains reducible.  No obstructive symptoms.  The patient's job involves lifting no more than 10 lbs and he is sitting most of the time.  He smokes 1.0-1.5 packs per day.  Past Medical History  Diagnosis Date  . Hypertension   . Diverticulitis 2011    resulted in partial colectomy  . Hematuria   . Anxiety     Past Surgical History  Procedure Laterality Date  . Colon surgery  2011    partial colectomy    History reviewed. No pertinent family history.  Social History History  Substance Use Topics  . Smoking status: Current Every Day Smoker -- 1.50 packs/day  . Smokeless tobacco: Never Used  . Alcohol Use: 12.6 oz/week    21 Cans of beer per week     Comment: about 3 beers daily- history of heavy liquor abuse    Allergies  Allergen Reactions  . Ibuprofen Other (See Comments)    Unknown reaction    Current Outpatient Prescriptions  Medication Sig Dispense Refill  . allopurinol (ZYLOPRIM) 300 MG tablet TAKE 1 TABLET BY MOUTH DAILY  90 tablet  3  . ALPRAZolam (XANAX) 1 MG tablet Take 1 tablet (1 mg total) by mouth 2 (two) times daily.  180 tablet  0  . colchicine 0.6 MG tablet Take 2 pills, then take 1 an hour later.  May take 1 or 2 a day as needed for gout prevention  30 tablet  0  . HYDROcodone-acetaminophen (NORCO/VICODIN) 5-325 MG per tablet Take 1 tablet by mouth every 8 (eight) hours as needed.  10 tablet  0  . Multiple Vitamins-Minerals (MULTIVITAMIN WITH MINERALS) tablet Take 1 tablet by mouth daily.      Marland Kitchen. triamcinolone cream (KENALOG) 0.1 % Apply 1 application topically 2  (two) times daily.  90 g  2   No current facility-administered medications for this visit.    Review of Systems Review of Systems  Constitutional: Negative for fever, chills and unexpected weight change.  HENT: Negative for congestion, hearing loss, sore throat, trouble swallowing and voice change.   Eyes: Negative for visual disturbance.  Respiratory: Negative for cough and wheezing.   Cardiovascular: Negative for chest pain, palpitations and leg swelling.  Gastrointestinal: Negative for nausea, vomiting, abdominal pain, diarrhea, constipation, blood in stool, abdominal distention, anal bleeding and rectal pain.  Genitourinary: Positive for testicular pain. Negative for hematuria and difficulty urinating.  Musculoskeletal: Negative for arthralgias.  Skin: Negative for rash and wound.  Neurological: Negative for seizures, syncope, weakness and headaches.  Hematological: Negative for adenopathy. Does not bruise/bleed easily.  Psychiatric/Behavioral: Negative for confusion.    Blood pressure 126/77, pulse 69, temperature 99.4 F (37.4 C), temperature source Temporal, resp. rate 18, height 5\' 8"  (1.727 m), weight 186 lb (84.369 kg).  Physical Exam Physical Exam WDWN in NAD - smells heavily of cigarettes HEENT:  EOMI, sclera anicteric Neck:  No masses, no thyromegaly Lungs:  CTA bilaterally; normal respiratory effort CV:  Regular rate and rhythm; no murmurs Abd:  +bowel sounds, soft, non-tender, no masses;  healed midline incision GU:  Bilateral descended testes; no testicular masses; visible reducible left inguinal hernia; no sign of right inguinal hernia Ext:  Well-perfused; no edema Skin:  Warm, dry; no sign of jaundice  Data Reviewed none  Assessment    Left inguinal hernia - reducible     Plan    Left inguinal hernia repair with mesh.  The surgical procedure has been discussed with the patient.  Potential risks, benefits, alternative treatments, and expected outcomes  have been explained.  All of the patient's questions at this time have been answered.  The likelihood of reaching the patient's treatment goal is good.  The patient understand the proposed surgical procedure and wishes to proceed.         Trevon Strothers K. 05/23/2014, 10:39 AM

## 2014-05-27 NOTE — Anesthesia Procedure Notes (Addendum)
Anesthesia Regional Block:  TAP block  Pre-Anesthetic Checklist: ,, timeout performed, Correct Patient, Correct Site, Correct Laterality, Correct Procedure, Correct Position, site marked, Risks and benefits discussed,  Surgical consent,  Pre-op evaluation,  At surgeon's request and post-op pain management  Laterality: Left  Prep: chloraprep and alcohol swabs       Needles:  Injection technique: Single-shot  Needle Type: Echogenic Stimulator Needle     Needle Length: 9cm 9 cm Needle Gauge: 21 and 21 G    Additional Needles:  Procedures: ultrasound guided (picture in chart) TAP block Narrative:  Start time: 05/27/2014 12:55 PM End time: 05/27/2014 1:05 PM Injection made incrementally with aspirations every 5 mL.  Performed by: Personally  Anesthesiologist: Arta BruceKevin Ossey MD  Additional Notes: Monitors applied. Patient sedated. Sterile prep and drape,hand hygiene and sterile gloves were used. Relevant anatomy identified.Needle position confirmed.Local anesthetic injected incrementally after negative aspiration. Local anesthetic spread visualized in Transversus Abdominus Plane. Vascular puncture avoided. No complications. Image printed for medical record.The patient tolerated the procedure well.       Procedure Name: LMA Insertion Date/Time: 05/27/2014 1:23 PM Performed by: Curly ShoresRAFT, JANET W Pre-anesthesia Checklist: Patient identified, Emergency Drugs available, Suction available and Patient being monitored Patient Re-evaluated:Patient Re-evaluated prior to inductionOxygen Delivery Method: Circle System Utilized Preoxygenation: Pre-oxygenation with 100% oxygen Intubation Type: IV induction Ventilation: Mask ventilation without difficulty LMA: LMA inserted LMA Size: 5.0 Number of attempts: 1 Airway Equipment and Method: bite block Placement Confirmation: positive ETCO2 and breath sounds checked- equal and bilateral Tube secured with: Tape Dental Injury: Teeth and Oropharynx as  per pre-operative assessment

## 2014-05-27 NOTE — Anesthesia Postprocedure Evaluation (Signed)
  Anesthesia Post-op Note  Patient: Jonathan Harrington  Procedure(s) Performed: Procedure(s): LEFT INGUINAL HERNIA REPAIR WITH MESH (Left) INSERTION OF MESH (Left)  Patient Location: PACU  Anesthesia Type:GA combined with regional for post-op pain  Level of Consciousness: awake, alert  and oriented  Airway and Oxygen Therapy: Patient Spontanous Breathing  Post-op Pain: mild  Post-op Assessment: Post-op Vital signs reviewed  Post-op Vital Signs: Reviewed  Last Vitals:  Filed Vitals:   05/27/14 1604  BP: 140/86  Pulse: 94  Temp: 36.6 C  Resp: 18    Complications: No apparent anesthesia complications

## 2014-05-27 NOTE — Transfer of Care (Signed)
Immediate Anesthesia Transfer of Care Note  Patient: Jonathan Harrington  Procedure(s) Performed: Procedure(s): LEFT INGUINAL HERNIA REPAIR WITH MESH (Left) INSERTION OF MESH (Left)  Patient Location: PACU  Anesthesia Type:GA combined with regional for post-op pain  Level of Consciousness: awake, alert  and oriented  Airway & Oxygen Therapy: Patient Spontanous Breathing and Patient connected to face mask oxygen  Post-op Assessment: Report given to PACU RN, Post -op Vital signs reviewed and stable and Patient moving all extremities X 4  Post vital signs: Reviewed and stable  Complications: No apparent anesthesia complications

## 2014-05-27 NOTE — Discharge Instructions (Signed)
[facility] ° °UMBILICAL OR INGUINAL HERNIA REPAIR: POST OP INSTRUCTIONS ° °Always review your discharge instruction sheet given to you by the facility where your surgery was performed. °IF YOU HAVE DISABILITY OR FAMILY LEAVE FORMS, YOU MUST BRING THEM TO THE OFFICE FOR PROCESSING.   °DO NOT GIVE THEM TO YOUR DOCTOR. ° °1. A  prescription for pain medication may be given to you upon discharge.  Take your pain medication as prescribed, if needed.  If narcotic pain medicine is not needed, then you may take acetaminophen (Tylenol) or ibuprofen (Advil) as needed. °2. Take your usually prescribed medications unless otherwise directed. °3. If you need a refill on your pain medication, please contact your pharmacy.  They will contact our office to request authorization. Prescriptions will not be filled after 5 pm or on week-ends. °4. You should follow a light diet the first 24 hours after arrival home, such as soup and crackers, etc.  Be sure to include lots of fluids daily.  Resume your normal diet the day after surgery. °5. Most patients will experience some swelling and bruising around the umbilicus or in the groin and scrotum.  Ice packs and reclining will help.  Swelling and bruising can take several days to resolve.  °6. It is common to experience some constipation if taking pain medication after surgery.  Increasing fluid intake and taking a stool softener (such as Colace) will usually help or prevent this problem from occurring.  A mild laxative (Milk of Magnesia or Miralax) should be taken according to package directions if there are no bowel movements after 48 hours. °7. Unless discharge instructions indicate otherwise, you may remove your bandages 24-48 hours after surgery, and you may shower at that time.  You will have steri-strips (small skin tapes) in place directly over the incision.  These strips should be left on the skin for 7-10 days. °8. ACTIVITIES:  You may resume regular (light)  daily activities beginning the next day--such as daily self-care, walking, climbing stairs--gradually increasing activities as tolerated.  You may have sexual intercourse when it is comfortable.  Refrain from any heavy lifting or straining until approved by your doctor. °a. You may drive when you are no longer taking prescription pain medication, you can comfortably wear a seatbelt, and you can safely maneuver your car and apply brakes. °b. RETURN TO WORK:  2-3 weeks with light duty - no lifting over 15 lbs. °9. You should see your doctor in the office for a follow-up appointment approximately 2-3 weeks after your surgery.  Make sure that you call for this appointment within a day or two after you arrive home to insure a convenient appointment time. °10. OTHER INSTRUCTIONS:  __________________________________________________________________________________________________________________________________________________________________________________________  °WHEN TO CALL YOUR DOCTOR: °1. Fever over 101.0 °2. Inability to urinate °3. Nausea and/or vomiting °4. Extreme swelling or bruising °5. Continued bleeding from incision. °6. Increased pain, redness, or drainage from the incision ° °The clinic staff is available to answer your questions during regular business hours.  Please don’t hesitate to call and ask to speak to one of the nurses for clinical concerns.  If you have a medical emergency, go to the nearest emergency room or call 911.  A surgeon from [facility] is always on call at the hospital ° ° °[address], [address], [place], [place]  [address] ? ° [address] [address], [place], [place]   [address] °[phone]    [phone]    FAX [phone] °Web   site: [email or web address] ° ° °Post Anesthesia Home Care Instructions ° °Activity: °Get plenty of rest for the remainder of the day. A responsible adult should stay with you for 24 hours following the procedure.  °For the next 24  hours, DO NOT: °-Drive a car °-Operate machinery °-Drink alcoholic beverages °-Take any medication unless instructed by your physician °-Make any legal decisions or sign important papers. ° °Meals: °Start with liquid foods such as gelatin or soup. Progress to regular foods as tolerated. Avoid greasy, spicy, heavy foods. If nausea and/or vomiting occur, drink only clear liquids until the nausea and/or vomiting subsides. Call your physician if vomiting continues. ° °Special Instructions/Symptoms: °Your throat may feel dry or sore from the anesthesia or the breathing tube placed in your throat during surgery. If this causes discomfort, gargle with warm salt water. The discomfort should disappear within 24 hours. ° °

## 2014-05-27 NOTE — Anesthesia Preprocedure Evaluation (Signed)
Anesthesia Evaluation  Patient identified by MRN, date of birth, ID band Patient awake    Reviewed: Allergy & Precautions, H&P , NPO status , Patient's Chart, lab work & pertinent test results  Airway Mallampati: I TM Distance: >3 FB Neck ROM: Full    Dental   Pulmonary Current Smoker,          Cardiovascular     Neuro/Psych    GI/Hepatic   Endo/Other    Renal/GU      Musculoskeletal   Abdominal   Peds  Hematology   Anesthesia Other Findings   Reproductive/Obstetrics                           Anesthesia Physical Anesthesia Plan  ASA: II  Anesthesia Plan: General   Post-op Pain Management:    Induction: Intravenous  Airway Management Planned: LMA  Additional Equipment:   Intra-op Plan:   Post-operative Plan: Extubation in OR  Informed Consent: I have reviewed the patients History and Physical, chart, labs and discussed the procedure including the risks, benefits and alternatives for the proposed anesthesia with the patient or authorized representative who has indicated his/her understanding and acceptance.     Plan Discussed with: CRNA and Surgeon  Anesthesia Plan Comments:         Anesthesia Quick Evaluation  

## 2014-05-27 NOTE — Op Note (Signed)
Hernia, Open, Procedure Note  Indications: The patient presented with a history of a left, reducible inguinal hernia.    Pre-operative Diagnosis: left reducible inguinal hernia Post-operative Diagnosis: same  Surgeon: Wynona LunaSUEI,MATTHEW K.   Assistants: Leighton ParodyAnn Marie Wilson - STFA-S  Anesthesia: General LMA anesthesia and TAP block  ASA Class: 2  Procedure Details  The patient was seen again in the Holding Room. The risks, benefits, complications, treatment options, and expected outcomes were discussed with the patient. The possibilities of reaction to medication, pulmonary aspiration, perforation of viscus, bleeding, recurrent infection, the need for additional procedures, and development of a complication requiring transfusion or further operation were discussed with the patient and/or family. The likelihood of success in repairing the hernia and returning the patient to their previous functional status is good.  There was concurrence with the proposed plan, and informed consent was obtained. The site of surgery was properly noted/marked. The patient was taken to the Operating Room, identified as Jonathan Harrington, and the procedure verified as left inguinal hernia repair. A Time Out was held and the above information confirmed.  The patient was placed in the supine position and underwent induction of anesthesia. The lower abdomen and groin was prepped with Chloraprep and draped in the standard fashion, and 0.25% Marcaine with epinephrine was used to anesthetize the skin over the mid-portion of the inguinal canal. An oblique incision was made. Dissection was carried down through the subcutaneous tissue with cautery to the external oblique fascia.  We opened the external oblique fascia along the direction of its fibers to the external ring.  The spermatic cord was circumferentially dissected bluntly and retracted with a Penrose drain.  The floor of the inguinal canal was inspected and a direct defect was  noted.  We closed the floor of the canal with 0 Vicryl.  We skeletonized the spermatic cord and reduced a small indirect hernia.  We used a left-sided Progrip mesh which was inserted and deployed across the floor of the inguinal canal. The mesh was tucked underneath the external oblique fascia laterally.  The flap of the mesh was closed around the spermatic cord to recreate the internal inguinal ring.  The mesh was secured to the pubic tubercle with 0 Vicryl.  The external oblique fascia was reapproximated with 2-0 Vicryl.  3-0 Vicryl was used to close the subcutaneous tissues and 4-0 Monocryl was used to close the skin in subcuticular fashion.  Benzoin and steri-strips were used to seal the incision.  A clean dressing was applied.  The patient was then extubated and brought to the recovery room in stable condition.  All sponge, instrument, and needle counts were correct prior to closure and at the conclusion of the case.   Estimated Blood Loss: less than 50 mL                 Complications: None; patient tolerated the procedure well.         Disposition: PACU - hemodynamically stable.         Condition: stable  Jonathan ArmsMatthew K. Corliss Skainssuei, MD, [facility]  General/ Trauma Surgery  05/27/2014 2:12 PM

## 2014-05-27 NOTE — Interval H&P Note (Signed)
History and Physical Interval Note:  05/27/2014 12:44 PM  Jonathan Harrington  has presented today for surgery, with the diagnosis of left inguinal hernia  The various methods of treatment have been discussed with the patient and family. After consideration of risks, benefits and other options for treatment, the patient has consented to  Procedure(s): LEFT INGUINAL HERNIA REPAIR WITH MESH (Left) INSERTION OF MESH (N/A) as a surgical intervention .  The patient's history has been reviewed, patient examined, no change in status, stable for surgery.  I have reviewed the patient's chart and labs.  Questions were answered to the patient's satisfaction.     Jonathan Kendzierski K.

## 2014-05-28 ENCOUNTER — Encounter (HOSPITAL_BASED_OUTPATIENT_CLINIC_OR_DEPARTMENT_OTHER): Payer: Self-pay | Admitting: Surgery

## 2014-05-28 LAB — POCT HEMOGLOBIN-HEMACUE: Hemoglobin: 13.3 g/dL (ref 13.0–17.0)

## 2014-06-03 ENCOUNTER — Telehealth (INDEPENDENT_AMBULATORY_CARE_PROVIDER_SITE_OTHER): Payer: Self-pay | Admitting: General Surgery

## 2014-06-03 NOTE — Telephone Encounter (Signed)
Message copied by Wilder GladeMCPHERSON, Kevona Lupinacci on Tue Jun 03, 2014  3:23 PM ------      Message from: Kerrin ChampagneLOWERY, TENA M      Created: Tue Jun 03, 2014  3:18 PM      Contact: [phone]       Pt has an post op appt but he asking to go back to work asap can he? Please call. ------

## 2014-06-03 NOTE — Telephone Encounter (Signed)
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

## 2014-06-23 ENCOUNTER — Encounter (INDEPENDENT_AMBULATORY_CARE_PROVIDER_SITE_OTHER): Payer: Self-pay | Admitting: Surgery

## 2014-06-23 ENCOUNTER — Ambulatory Visit (INDEPENDENT_AMBULATORY_CARE_PROVIDER_SITE_OTHER): Payer: Managed Care, Other (non HMO) | Admitting: Surgery

## 2014-06-23 VITALS — BP 122/77 | HR 80 | Temp 97.1°F | Resp 18 | Ht 71.0 in | Wt 187.8 lb

## 2014-06-23 DIAGNOSIS — K4090 Unilateral inguinal hernia, without obstruction or gangrene, not specified as recurrent: Secondary | ICD-10-CM

## 2014-06-23 NOTE — Progress Notes (Signed)
Status post left inguinal hernia repair with mesh on 05/27/14 for direct left internal hernia. He is doing quite well. No constipation. Minimal swelling. He has a firm scar tissue under his incision. Incision seems to be well-healed with no sign of infection. No sign of recurrent hernia.  The patient should continue limiting his level of activity for 2 more weeks then may resume flaccidity. Followup as needed.  Wilmon ArmsMatthew K. Corliss Skainssuei, MD, [facility]  General/ Trauma Surgery  06/23/2014 10:10 AM

## 2014-08-27 ENCOUNTER — Ambulatory Visit (INDEPENDENT_AMBULATORY_CARE_PROVIDER_SITE_OTHER): Payer: Managed Care, Other (non HMO) | Admitting: Family Medicine

## 2014-08-27 VITALS — BP 146/78 | HR 93 | Temp 97.8°F | Resp 16 | Ht 71.0 in | Wt 192.6 lb

## 2014-08-27 DIAGNOSIS — F411 Generalized anxiety disorder: Secondary | ICD-10-CM

## 2014-08-27 DIAGNOSIS — Z13220 Encounter for screening for lipoid disorders: Secondary | ICD-10-CM

## 2014-08-27 DIAGNOSIS — E785 Hyperlipidemia, unspecified: Secondary | ICD-10-CM

## 2014-08-27 DIAGNOSIS — Z125 Encounter for screening for malignant neoplasm of prostate: Secondary | ICD-10-CM

## 2014-08-27 DIAGNOSIS — Z130 Encounter for screening for diseases of the blood and blood-forming organs and certain disorders involving the immune mechanism: Secondary | ICD-10-CM

## 2014-08-27 DIAGNOSIS — Z23 Encounter for immunization: Secondary | ICD-10-CM

## 2014-08-27 DIAGNOSIS — Z0000 Encounter for general adult medical examination without abnormal findings: Secondary | ICD-10-CM

## 2014-08-27 LAB — COMPREHENSIVE METABOLIC PANEL
ALT: 41 U/L (ref 0–53)
AST: 47 U/L — ABNORMAL HIGH (ref 0–37)
Albumin: 4.3 g/dL (ref 3.5–5.2)
Alkaline Phosphatase: 57 U/L (ref 39–117)
BILIRUBIN TOTAL: 0.6 mg/dL (ref 0.2–1.2)
BUN: 8 mg/dL (ref 6–23)
CO2: 23 meq/L (ref 19–32)
CREATININE: 0.96 mg/dL (ref 0.50–1.35)
Calcium: 9.2 mg/dL (ref 8.4–10.5)
Chloride: 104 mEq/L (ref 96–112)
GLUCOSE: 94 mg/dL (ref 70–99)
Potassium: 4.3 mEq/L (ref 3.5–5.3)
SODIUM: 138 meq/L (ref 135–145)
TOTAL PROTEIN: 7.3 g/dL (ref 6.0–8.3)

## 2014-08-27 LAB — CBC WITH DIFFERENTIAL/PLATELET
BASOS PCT: 1 % (ref 0–1)
Basophils Absolute: 0.1 10*3/uL (ref 0.0–0.1)
EOS ABS: 0.1 10*3/uL (ref 0.0–0.7)
EOS PCT: 2 % (ref 0–5)
HEMATOCRIT: 42.4 % (ref 39.0–52.0)
Hemoglobin: 15.2 g/dL (ref 13.0–17.0)
Lymphocytes Relative: 33 % (ref 12–46)
Lymphs Abs: 2.3 10*3/uL (ref 0.7–4.0)
MCH: 32.8 pg (ref 26.0–34.0)
MCHC: 35.8 g/dL (ref 30.0–36.0)
MCV: 91.6 fL (ref 78.0–100.0)
MONOS PCT: 7 % (ref 3–12)
Monocytes Absolute: 0.5 10*3/uL (ref 0.1–1.0)
Neutro Abs: 3.9 10*3/uL (ref 1.7–7.7)
Neutrophils Relative %: 57 % (ref 43–77)
Platelets: 167 10*3/uL (ref 150–400)
RBC: 4.63 MIL/uL (ref 4.22–5.81)
RDW: 14.2 % (ref 11.5–15.5)
WBC: 6.9 10*3/uL (ref 4.0–10.5)

## 2014-08-27 LAB — LIPID PANEL
CHOL/HDL RATIO: 5 ratio
CHOLESTEROL: 134 mg/dL (ref 0–200)
HDL: 27 mg/dL — ABNORMAL LOW (ref >39)
LDL Cholesterol: 36 mg/dL (ref 0–99)
TRIGLYCERIDES: 355 mg/dL — AB (ref <150)
VLDL: 71 mg/dL — ABNORMAL HIGH (ref 0–40)

## 2014-08-27 MED ORDER — ALPRAZOLAM 1 MG PO TABS: 1.0000 mg | ORAL_TABLET | Freq: Two times a day (BID) | ORAL | Status: DC

## 2014-08-27 NOTE — Patient Instructions (Addendum)
I will be in touch with your labs.  Take care, let us know if you need anything and have a great fall! Influenza Virus Vaccine injection (Fluarix) What is this medicine? INFLUENZA VIRUS VACCINE (in floo EN zuh VAHY ruhs vak SEEN) helps to reduce the risk of getting influenza also known as the flu. This medicine may be used for other purposes; ask your health care provider or pharmacist if you have questions. COMMON BRAND NAME(S): Fluarix, Fluzone What should I tell my health care provider before I take this medicine? They need to know if you have any of these conditions: -bleeding disorder like hemophilia -fever or infection -Guillain-Barre syndrome or other neurological problems -immune system problems -infection with the human immunodeficiency virus (HIV) or AIDS -low blood platelet counts -multiple sclerosis -an unusual or allergic reaction to influenza virus vaccine, eggs, chicken proteins, latex, gentamicin, other medicines, foods, dyes or preservatives -pregnant or trying to get pregnant -breast-feeding How should I use this medicine? This vaccine is for injection into a muscle. It is given by a health care professional. A copy of Vaccine Information Statements will be given before each vaccination. Read this sheet carefully each time. The sheet may change frequently. Talk to your pediatrician regarding the use of this medicine in children. Special care may be needed. Overdosage: If you think you have taken too much of this medicine contact a poison control center or emergency room at once. NOTE: This medicine is only for you. Do not share this medicine with others. What if I miss a dose? This does not apply. What may interact with this medicine? -chemotherapy or radiation therapy -medicines that lower your immune system like etanercept, anakinra, infliximab, and adalimumab -medicines that treat or prevent blood clots like warfarin -phenytoin -steroid medicines like prednisone or  cortisone -theophylline -vaccines This list may not describe all possible interactions. Give your health care provider a list of all the medicines, herbs, non-prescription drugs, or dietary supplements you use. Also tell them if you smoke, drink alcohol, or use illegal drugs. Some items may interact with your medicine. What should I watch for while using this medicine? Report any side effects that do not go away within 3 days to your doctor or health care professional. Call your health care provider if any unusual symptoms occur within 6 weeks of receiving this vaccine. You may still catch the flu, but the illness is not usually as bad. You cannot get the flu from the vaccine. The vaccine will not protect against colds or other illnesses that may cause fever. The vaccine is needed every year. What side effects may I notice from receiving this medicine? Side effects that you should report to your doctor or health care professional as soon as possible: -allergic reactions like skin rash, itching or hives, swelling of the face, lips, or tongue Side effects that usually do not require medical attention (report to your doctor or health care professional if they continue or are bothersome): -fever -headache -muscle aches and pains -pain, tenderness, redness, or swelling at site where injected -weak or tired This list may not describe all possible side effects. Call your doctor for medical advice about side effects. You may report side effects to FDA at 1-800-FDA-1088. Where should I keep my medicine? This vaccine is only given in a clinic, pharmacy, doctor's office, or other health care setting and will not be stored at home. NOTE: This sheet is a summary. It may not cover all possible information. If you have questions  about this medicine, talk to your doctor, pharmacist, or health care provider.  2015, Elsevier/Gold Standard. (2008-06-25 09:30:40)

## 2014-08-27 NOTE — Progress Notes (Addendum)
[facility] [address] [place] [address] [phone]  Date:  08/27/2014   Name:  Jonathan Harrington   DOB:  01-Jan-1956   MRN:  [id number]  PCP:  Abbe Amsterdam, MD    Chief Complaint: Annual Exam   History of Present Illness:  Jonathan Harrington is a 58 y.o. very pleasant male patient who presents with the following:  He is here today for a PE.  He is doing well overall.  He takex xanax BID and is doing well with this dose.   He is fasting today for labs.   Recent left inguinal hernia repair- doing well in this regard and is not having pain   He continues to smoke, but does not have any other concerns at this time   Patient Active Problem List   Diagnosis Date Noted  . Left inguinal hernia 05/23/2014  . Anxiety state, unspecified 09/17/2013  . Gout 01/17/2013  . NONSPEC ABN FINDNG RAD & OTH EXAM ABDOMINAL AREA 09/16/2010  . DIVERTICULOSIS-COLON 08/13/2010  . DIVERTICULITIS, COLON 08/13/2010  . ABDOMINAL PAIN-LLQ 08/13/2010  . Nonspecific (abnormal) findings on radiological and other examination of body structure 08/13/2010  . PERSONAL HX COLONIC POLYPS 08/13/2010  . NONSPCIFC ABN FINDING RAD & OTH EXAM LUNG FIELD 08/13/2010    Past Medical History  Diagnosis Date  . Diverticulitis 2011    resulted in partial colectomy  . Hematuria   . Anxiety   . Wears glasses   . Full dentures   . Gout     Past Surgical History  Procedure Laterality Date  . Colon surgery  2011    partial colectomy  . Colonoscopy    . Finger arthroplasty  1990    rt index fx  . Inguinal hernia repair Left 05/27/2014    Procedure: LEFT INGUINAL HERNIA REPAIR WITH MESH;  Surgeon: Wilmon Arms. Corliss Skains, MD;  Location: [facility];  Service: General;  Laterality: Left;  . Insertion of mesh Left 05/27/2014    Procedure: INSERTION OF MESH;  Surgeon: Wilmon Arms. Corliss Skains, MD;  Location: [facility];  Service: General;  Laterality: Left;  . Hernia repair       History  Substance Use Topics  . Smoking status: Current Every Day Smoker -- 1.50 packs/day  . Smokeless tobacco: Never Used  . Alcohol Use: 12.6 oz/week    21 Cans of beer per week     Comment: about 3 beers daily- history of heavy liquor abuse    History reviewed. No pertinent family history.  Allergies  Allergen Reactions  . Ibuprofen Other (See Comments)    Unknown reaction    Medication list has been reviewed and updated.  Current Outpatient Prescriptions on File Prior to Visit  Medication Sig Dispense Refill  . allopurinol (ZYLOPRIM) 300 MG tablet TAKE 1 TABLET BY MOUTH DAILY  90 tablet  3  . ALPRAZolam (XANAX) 1 MG tablet Take 1 tablet (1 mg total) by mouth 2 (two) times daily.  180 tablet  0  . Multiple Vitamins-Minerals (MULTIVITAMIN WITH MINERALS) tablet Take 1 tablet by mouth daily.      Marland Kitchen triamcinolone cream (KENALOG) 0.1 % Apply 1 application topically 2 (two) times daily.  90 g  2  . colchicine 0.6 MG tablet Take 2 pills, then take 1 an hour later.  May take 1 or 2 a day as needed for gout prevention  30 tablet  0  . oxyCODONE-acetaminophen (PERCOCET/ROXICET) 5-325 MG per  tablet Take 1 tablet by mouth every 4 (four) hours as needed for severe pain.  40 tablet  0   No current facility-administered medications on file prior to visit.    Review of Systems:  As per HPI- otherwise negative.   Physical Examination: Filed Vitals:   08/27/14 0845  BP: 146/78  Pulse: 93  Temp: 97.8 F (36.6 C)  Resp: 16   Filed Vitals:   08/27/14 0845  Height:  (1.803 m)  Weight: 192 lb 9.6 oz (87.363 kg)   Body mass index is 26.87 kg/(m^2). Ideal Body Weight: Weight in (lb) to have BMI = 25: 178.9  GEN: WDWN, NAD, Non-toxic, A & O x 3, looks well, has regained the weight that he lost HEENT: Atraumatic, Normocephalic. Neck supple. No masses, No LAD.  Bilateral TM wnl, oropharynx normal.  PEERL,EOMI.   Ears and Nose: No external deformity. CV: RRR, No M/G/R. No  JVD. No thrill. No extra heart sounds. PULM: CTA B, no wheezes, crackles, rhonchi. No retractions. No resp. distress. No accessory muscle use. ABD: S, NT, ND. No rebound. No HSM. EXTR: No c/c/e NEURO Normal gait.  PSYCH: Normally interactive. Conversant. Not depressed or anxious appearing.  Calm demeanor.  GU: normal genital exam, no scrotal masses or tenderness, normal penis.  Normal DRE Healing scar from inguinal hernia repair left groin   Assessment and Plan: Physical exam - Plan: Flu Vaccine QUAD 36+ mos IM  Anxiety state, unspecified - Plan: ALPRAZolam (XANAX) 1 MG tablet, ALPRAZolam (XANAX) 1 MG tablet, Comprehensive metabolic panel  Screening for prostate cancer - Plan: PSA  Screening for hyperlipidemia - Plan: Lipid panel  Screening for deficiency anemia - Plan: CBC with Differential  Await labs and will follow-up with him. Given rx for zoster vaccine, flu shot today Continue alprazolam as needed for anxiety   Signed Abbe Amsterdam, MD  Called to speak to him about labs.  All looks good except his triglycerides are quite high.  Will try niacin for him

## 2014-08-28 ENCOUNTER — Encounter: Payer: Self-pay | Admitting: Family Medicine

## 2014-08-28 LAB — PSA: PSA: 0.84 ng/mL (ref <=4.00)

## 2014-08-28 MED ORDER — NIACIN ER (ANTIHYPERLIPIDEMIC) 500 MG PO TBCR: 500.0000 mg | EXTENDED_RELEASE_TABLET | Freq: Every day | ORAL | Status: DC

## 2014-08-28 NOTE — Addendum Note (Signed)
Addended by: Abbe Amsterdam C on: 08/28/2014 05:43 PM   Modules accepted: Orders

## 2014-09-26 ENCOUNTER — Other Ambulatory Visit: Payer: Self-pay | Admitting: Family Medicine

## 2014-10-01 ENCOUNTER — Other Ambulatory Visit: Payer: Self-pay | Admitting: Family Medicine

## 2014-10-02 ENCOUNTER — Other Ambulatory Visit: Payer: Self-pay | Admitting: Family Medicine

## 2014-10-02 DIAGNOSIS — L989 Disorder of the skin and subcutaneous tissue, unspecified: Secondary | ICD-10-CM

## 2014-10-02 MED ORDER — TRIAMCINOLONE ACETONIDE 0.1 % EX CREA: 1.0000 "application " | TOPICAL_CREAM | Freq: Two times a day (BID) | CUTANEOUS | Status: DC

## 2014-10-02 NOTE — Telephone Encounter (Signed)
What medication is this for?  I actually saw his wife in clinic and she asked me to refill his triamcinolone cream which I did do

## 2014-10-02 NOTE — Telephone Encounter (Signed)
Dr Patsy Lageropland, you saw this pt in Sept, but don't see this med discussed since last Feb OV. Do you want to give RFs?

## 2015-01-22 ENCOUNTER — Other Ambulatory Visit: Payer: Self-pay | Admitting: Family Medicine

## 2015-02-17 ENCOUNTER — Other Ambulatory Visit: Payer: Self-pay | Admitting: Physician Assistant

## 2015-02-23 ENCOUNTER — Other Ambulatory Visit: Payer: Self-pay | Admitting: Family Medicine

## 2015-02-27 ENCOUNTER — Telehealth: Payer: Self-pay | Admitting: Family Medicine

## 2015-02-27 DIAGNOSIS — E785 Hyperlipidemia, unspecified: Secondary | ICD-10-CM

## 2015-02-27 MED ORDER — NIACIN ER (ANTIHYPERLIPIDEMIC) 500 MG PO TBCR: 500.0000 mg | EXTENDED_RELEASE_TABLET | Freq: Every day | ORAL | Status: DC

## 2015-02-27 NOTE — Telephone Encounter (Signed)
Discussed with his wife at clinic- they are not sure if they need to come in for a chl medication refill.   He does not, but I would like him to come in for labs only.  Ordered FLP and refilled his niaspan

## 2015-03-04 ENCOUNTER — Other Ambulatory Visit (INDEPENDENT_AMBULATORY_CARE_PROVIDER_SITE_OTHER): Payer: Managed Care, Other (non HMO) | Admitting: *Deleted

## 2015-03-04 DIAGNOSIS — E785 Hyperlipidemia, unspecified: Secondary | ICD-10-CM | POA: Diagnosis not present

## 2015-03-05 LAB — LIPID PANEL
Cholesterol: 154 mg/dL (ref 0–200)
HDL: 16 mg/dL — ABNORMAL LOW (ref >=40)
Total CHOL/HDL Ratio: 9.6 Ratio
Triglycerides: 440 mg/dL — ABNORMAL HIGH (ref <150)

## 2015-03-07 ENCOUNTER — Telehealth: Payer: Self-pay

## 2015-03-07 ENCOUNTER — Other Ambulatory Visit: Payer: Self-pay | Admitting: Physician Assistant

## 2015-03-07 ENCOUNTER — Other Ambulatory Visit: Payer: Self-pay | Admitting: Family Medicine

## 2015-03-07 DIAGNOSIS — E785 Hyperlipidemia, unspecified: Secondary | ICD-10-CM

## 2015-03-07 NOTE — Telephone Encounter (Signed)
Called pt and LMOM to CB. His activation number is: [email or web address]

## 2015-03-07 NOTE — Telephone Encounter (Signed)
PATIENT WOULD LIKE TO GET HIS  ACTIVATION CODE FOR HIS MY CHART. SARAH WEBER SAID WE COULD GET THIS FOR HIM. HE WOULD LIKE TO GET A CALL BACK AS SOON AS POSSIBLE. BEST PHONE [phone] (CELL)  MBC

## 2015-03-08 MED ORDER — ALPRAZOLAM 1 MG PO TABS: 1.0000 mg | ORAL_TABLET | Freq: Two times a day (BID) | ORAL | Status: DC

## 2015-03-10 MED ORDER — NIACIN ER (ANTIHYPERLIPIDEMIC) 500 MG PO TBCR: 1500.0000 mg | EXTENDED_RELEASE_TABLET | Freq: Every day | ORAL | Status: DC

## 2015-03-10 NOTE — Telephone Encounter (Signed)
Spoke with him- asked him to increase his niaspan to 1,000 at bedtime for one month, and then if doing ok go to 1500. He will come in for repeat lipids in about 2 months; placed lab order and refill order for increased dose of niaspan

## 2015-03-10 NOTE — Telephone Encounter (Signed)
Spoke to pt about f/up visit. He reported he had just been in for lab test [name] his Niacin bc his chol is still high. He is in agreement w/this plan. He just picked up a 90 day supply of the 500 mg Niacin, and wanted to know if there is some way he can increase the dosage with what he just picked up so as not to waste what he has? Also, Dr Patsy Lageropland, we had given him notices that he was due for OV to get more allopurinol, but since you just had him come in for Lab Only, not sure that you need for him to f/up yet? Please advise when he needs to make another appt to see you and how many RFs I can give on Allopurinol.

## 2015-04-21 ENCOUNTER — Encounter: Payer: Self-pay | Admitting: Internal Medicine

## 2015-04-27 ENCOUNTER — Telehealth: Payer: Self-pay

## 2015-04-27 NOTE — Telephone Encounter (Signed)
Pt was prescribed NIACIN by Dr. Patsy Lageropland and was instructed at his last visit to take 2 pills every day. However, when he picked up the rx at the pharmacy the bottle said to take 3 pills a day. Pt states that when he took three pills he started feeling very hot, sweaty and itchy. Please advise pt what he should do from here.

## 2015-04-27 NOTE — Telephone Encounter (Signed)
niacin (NIASPAN) 500 MG CR tablet [[id number]][[id number]]      Order Details    Dose: 1,500 mg Route: Oral Frequency: Daily at bedtime   Dispense Quantity:  270 tablet Refills:  2 Fills Remaining:  2          Sig: Take 3 tablets (1,500 mg total) by mouth at bedtime.         Written Date:  03/10/15 Expiration Date:  03/09/16     Start Date:  03/10/15 End Date:  --     Ordering Provider:  -- Authorizing Provider:  Pearline CablesJessica C [name], MD Ordering User:  Pearline CablesJessica C [name], MD         Dr. Patsy Lageropland, please clarify.

## 2015-04-27 NOTE — Telephone Encounter (Signed)
Called him back- he states that when he tried to take 3 niaspan he had flushing.  He does fine with just 2.  Advised him to go back to 2 since he tolerated this well.  He thinks that perhaps the pharmacy changed his medication as it looks different from the pills he got in the past. Let him know that the rx has not changed on my end (given rx for 270 niaspan 500 with 2 RF back in March) but I am glad to look at his pills for him.

## 2015-05-21 ENCOUNTER — Ambulatory Visit (INDEPENDENT_AMBULATORY_CARE_PROVIDER_SITE_OTHER): Payer: Managed Care, Other (non HMO) | Admitting: Family Medicine

## 2015-05-21 VITALS — BP 140/80 | HR 90 | Temp 98.9°F | Resp 18 | Ht 71.25 in | Wt 195.6 lb

## 2015-05-21 DIAGNOSIS — E785 Hyperlipidemia, unspecified: Secondary | ICD-10-CM | POA: Diagnosis not present

## 2015-05-21 DIAGNOSIS — F411 Generalized anxiety disorder: Secondary | ICD-10-CM

## 2015-05-21 LAB — COMPREHENSIVE METABOLIC PANEL
ALT: 21 U/L (ref 0–53)
AST: 29 U/L (ref 0–37)
Albumin: 4.4 g/dL (ref 3.5–5.2)
Alkaline Phosphatase: 60 U/L (ref 39–117)
BUN: 9 mg/dL (ref 6–23)
CO2: 24 meq/L (ref 19–32)
Calcium: 9.6 mg/dL (ref 8.4–10.5)
Chloride: 104 mEq/L (ref 96–112)
Creat: 0.9 mg/dL (ref 0.50–1.35)
Glucose, Bld: 94 mg/dL (ref 70–99)
Potassium: 4.7 mEq/L (ref 3.5–5.3)
SODIUM: 141 meq/L (ref 135–145)
Total Bilirubin: 0.7 mg/dL (ref 0.2–1.2)
Total Protein: 7.8 g/dL (ref 6.0–8.3)

## 2015-05-21 LAB — LIPID PANEL
Cholesterol: 154 mg/dL (ref 0–200)
HDL: 21 mg/dL — ABNORMAL LOW (ref >=40)
LDL Cholesterol: 68 mg/dL (ref 0–99)
TRIGLYCERIDES: 325 mg/dL — AB (ref <150)
Total CHOL/HDL Ratio: 7.3 Ratio
VLDL: 65 mg/dL — ABNORMAL HIGH (ref 0–40)

## 2015-05-21 MED ORDER — ALPRAZOLAM 1 MG PO TABS: 1.0000 mg | ORAL_TABLET | Freq: Two times a day (BID) | ORAL | Status: DC

## 2015-05-21 NOTE — Patient Instructions (Signed)
Good to see you as always- I will be in touch with your labs and we will see how you are doing on the increase dose of niacin

## 2015-05-21 NOTE — Progress Notes (Signed)
[facility] [address], [place] [address] [phone]  Date:  05/21/2015   Name:  Jonathan Harrington   DOB:  [birth year]   MRN:  [id number]  PCP:  Abbe Amsterdam, MD    Chief Complaint: Medication Refill and Lab Work   History of Present Illness:  Jonathan Harrington is a 59 y.o. very pleasant male patient who presents with the following:  He is here today to check on/ refill his xanax and also check on his cholesterol He upped his niaspan to 2 pills- he tried to do 3 but broke out in a rash and had itching.  He also accidentally took 4 pills one day and had even worse itching He takes xanax 1mg  BID.  This is working ok for him, he has been on this for a long time  He last ate today at noon- just watermelon.  He did have donuts at 0800 today.    Patient Active Problem List   Diagnosis Date Noted  . Left inguinal hernia 05/23/2014  . Anxiety state, unspecified 09/17/2013  . Gout 01/17/2013  . NONSPEC ABN FINDNG RAD & OTH EXAM ABDOMINAL AREA 09/16/2010  . DIVERTICULOSIS-COLON 08/13/2010  . DIVERTICULITIS, COLON 08/13/2010  . ABDOMINAL PAIN-LLQ 08/13/2010  . Nonspecific (abnormal) findings on radiological and other examination of body structure 08/13/2010  . PERSONAL HX COLONIC POLYPS 08/13/2010  . NONSPCIFC ABN FINDING RAD & OTH EXAM LUNG FIELD 08/13/2010    Past Medical History  Diagnosis Date  . Diverticulitis 2011    resulted in partial colectomy  . Hematuria   . Anxiety   . Wears glasses   . Full dentures   . Gout     Past Surgical History  Procedure Laterality Date  . Colon surgery  2011    partial colectomy  . Colonoscopy    . Finger arthroplasty  1990    rt index fx  . Inguinal hernia repair Left 05/27/2014    Procedure: LEFT INGUINAL HERNIA REPAIR WITH MESH;  Surgeon: Wilmon Arms. Corliss Skains, MD;  Location: [facility];  Service: General;  Laterality: Left;  . Insertion of mesh Left 05/27/2014    Procedure: INSERTION OF MESH;   Surgeon: Wilmon Arms. Corliss Skains, MD;  Location: [facility];  Service: General;  Laterality: Left;  . Hernia repair      History  Substance Use Topics  . Smoking status: Current Every Day Smoker -- 1.50 packs/day  . Smokeless tobacco: Never Used  . Alcohol Use: 12.6 oz/week    21 Cans of beer per week     Comment: about 3 beers daily- history of heavy liquor abuse    History reviewed. No pertinent family history.  Allergies  Allergen Reactions  . Ibuprofen Other (See Comments)    Unknown reaction    Medication list has been reviewed and updated.  Current Outpatient Prescriptions on File Prior to Visit  Medication Sig Dispense Refill  . allopurinol (ZYLOPRIM) 300 MG tablet Take 1 tablet (300 mg total) by mouth daily. 90 tablet 3  . ALPRAZolam (XANAX) 1 MG tablet Take 1 tablet (1 mg total) by mouth 2 (two) times daily. 180 tablet 1  . ALPRAZolam (XANAX) 1 MG tablet Take 1 tablet (1 mg total) by mouth 2 (two) times daily. 60 tablet 0  . colchicine 0.6 MG tablet Take 2 pills, then take 1 an hour later.  May take 1 or 2 a day as needed for gout prevention 30 tablet  0  . Multiple Vitamins-Minerals (MULTIVITAMIN WITH MINERALS) tablet Take 1 tablet by mouth daily.    . niacin (NIASPAN) 500 MG CR tablet Take 3 tablets (1,500 mg total) by mouth at bedtime. 270 tablet 2  . triamcinolone cream (KENALOG) 0.1 % Apply 1 application topically 2 (two) times daily. 90 g 2   No current facility-administered medications on file prior to visit.    Review of Systems:  As per HPI- otherwise negative.   Physical Examination: Filed Vitals:   05/21/15 1614  BP: 140/80  Pulse: 101  Temp: 98.9 F (37.2 C)  Resp: 18   Filed Vitals:   05/21/15 1614  Height: 5' 11.25" (1.81 m)  Weight: 195 lb 9.6 oz (88.724 kg)   Body mass index is 27.08 kg/(m^2). Ideal Body Weight: Weight in (lb) to have BMI = 25: 180.1  GEN: WDWN, NAD, Non-toxic, A & O x 3, overweight, looks well HEENT:  Atraumatic, Normocephalic. Neck supple. No masses, No LAD. Ears and Nose: No external deformity. CV: RRR, No M/G/R. No JVD. No thrill. No extra heart sounds. PULM: CTA B, no wheezes, crackles, rhonchi. No retractions. No resp. distress. No accessory muscle use. EXTR: No c/c/e NEURO Normal gait.  PSYCH: Normally interactive. Conversant. Not depressed or anxious appearing.  Calm demeanor.    Assessment and Plan: GAD (generalized anxiety disorder) - Plan: ALPRAZolam (XANAX) 1 MG tablet  Dyslipidemia - Plan: Lipid panel, Comprehensive metabolic panel  Refilled his xanax, ordered labs as above.  Will follow-up with him pending labs, we hope that his cholesterol ratios will be improved with increased dose of niacin  Signed Abbe Amsterdam, MD

## 2015-09-04 ENCOUNTER — Ambulatory Visit (INDEPENDENT_AMBULATORY_CARE_PROVIDER_SITE_OTHER): Payer: Managed Care, Other (non HMO)

## 2015-09-04 ENCOUNTER — Ambulatory Visit (INDEPENDENT_AMBULATORY_CARE_PROVIDER_SITE_OTHER): Payer: Managed Care, Other (non HMO) | Admitting: Family Medicine

## 2015-09-04 ENCOUNTER — Telehealth: Payer: Self-pay

## 2015-09-04 VITALS — BP 148/70 | HR 91 | Temp 97.9°F | Resp 16 | Ht 71.0 in | Wt 186.0 lb

## 2015-09-04 DIAGNOSIS — J029 Acute pharyngitis, unspecified: Secondary | ICD-10-CM

## 2015-09-04 DIAGNOSIS — G4700 Insomnia, unspecified: Secondary | ICD-10-CM

## 2015-09-04 DIAGNOSIS — R0602 Shortness of breath: Secondary | ICD-10-CM | POA: Diagnosis not present

## 2015-09-04 DIAGNOSIS — R531 Weakness: Secondary | ICD-10-CM | POA: Diagnosis not present

## 2015-09-04 LAB — COMPLETE METABOLIC PANEL WITH GFR
ALT: 12 U/L (ref 9–46)
AST: 18 U/L (ref 10–35)
Albumin: 4.5 g/dL (ref 3.6–5.1)
Alkaline Phosphatase: 54 U/L (ref 40–115)
BUN: 13 mg/dL (ref 7–25)
CO2: 27 mmol/L (ref 20–31)
Calcium: 9.6 mg/dL (ref 8.6–10.3)
Chloride: 105 mmol/L (ref 98–110)
Creat: 0.98 mg/dL (ref 0.70–1.25)
GFR, Est African American: >89 mL/min (ref >=60)
GFR, Est Non African American: 83 mL/min (ref >=60)
Glucose, Bld: 112 mg/dL — ABNORMAL HIGH (ref 65–99)
Potassium: 4.5 mmol/L (ref 3.5–5.3)
Sodium: 140 mmol/L (ref 135–146)
Total Bilirubin: 0.6 mg/dL (ref 0.2–1.2)
Total Protein: 7.7 g/dL (ref 6.1–8.1)

## 2015-09-04 LAB — POCT CBC
Granulocyte percent: 67.8 %G (ref 37–80)
HCT, POC: 46.8 % (ref 43.5–53.7)
Hemoglobin: 15.7 g/dL (ref 14.1–18.1)
Lymph, poc: 1.9 (ref 0.6–3.4)
MCH, POC: 30.9 pg (ref 27–31.2)
MCHC: 33.6 g/dL (ref 31.8–35.4)
MCV: 92 fL (ref 80–97)
MID (cbc): 0.4 (ref 0–0.9)
MPV: 6.3 fL (ref 0–99.8)
POC Granulocyte: 4.8 (ref 2–6.9)
POC LYMPH PERCENT: 26.4 %L (ref 10–50)
POC MID %: 5.8 %M (ref 0–12)
Platelet Count, POC: 239 10*3/uL (ref 142–424)
RBC: 5.08 M/uL (ref 4.69–6.13)
RDW, POC: 14.1 %
WBC: 7.1 10*3/uL (ref 4.6–10.2)

## 2015-09-04 LAB — POCT RAPID STREP A (OFFICE): Rapid Strep A Screen: NEGATIVE

## 2015-09-04 LAB — GLUCOSE, POCT (MANUAL RESULT ENTRY): POC Glucose: 109 mg/dl — AB (ref 70–99)

## 2015-09-04 LAB — POCT GLYCOSYLATED HEMOGLOBIN (HGB A1C): Hemoglobin A1C: 5.2

## 2015-09-04 LAB — POCT SEDIMENTATION RATE: POCT SED RATE: 39 mm/hr — AB (ref 0–22)

## 2015-09-04 MED ORDER — MAGIC MOUTHWASH W/LIDOCAINE
10.0000 mL | ORAL | Status: DC | PRN
Start: 2015-09-04 — End: 2016-02-04

## 2015-09-04 MED ORDER — LORAZEPAM 0.5 MG PO TABS: 0.5000 mg | ORAL_TABLET | Freq: Two times a day (BID) | ORAL | Status: DC | PRN

## 2015-09-04 NOTE — Telephone Encounter (Signed)
Pt called to ask if he was supposed to be prescribed something for his sore throat; he thought Dr. Elbert Ewings said he would send something in.

## 2015-09-04 NOTE — Progress Notes (Signed)
Subjective:    Patient ID: Jonathan Harrington, male    DOB: 25-Jun-1956, 59 y.o.   MRN: [id number] This chart was scribed for Elvina Sidle, MD by Littie Deeds, Medical Scribe. This patient was seen in Room 4 and the patient's care was started at 12:52 PM.   HPI HPI Comments: Jonathan Harrington is a 59 y.o. male who [facility] complaining of gradual onset sore throat that started 3 days ago. Patient also developed some weakness, dropping things at work. He also reports having fatigue, SOB, and urinary frequency. He did use his wife's inhaler which did provide some relief. Patient did have some diarrhea on the first day. He did sleep throughout the day this past Wednesday, 2 days ago, but he has had difficulty sleeping since then, for the past 2 days/nights; he took some OTC sleep medications but without relief. Patient denies fever, visual problems, and known weight loss. He admits to smoking.  Patient works as an Microbiologist.  Review of Systems  Constitutional: Positive for fatigue. Negative for fever and unexpected weight change.  HENT: Positive for sore throat.   Eyes: Negative for visual disturbance.  Respiratory: Positive for shortness of breath.   Genitourinary: Positive for frequency.  Neurological: Positive for weakness.  Psychiatric/Behavioral: Positive for sleep disturbance.       Objective:   Physical Exam CONSTITUTIONAL: Well developed/well nourished HEAD: Normocephalic/atraumatic EYES: EOM/PERRL ENMT: Mucous membranes moist. Throat is very red but without swelling. NECK: supple. No adenopathy or thyromegaly. SPINE: entire spine nontender CV: S1/S2 noted, no murmurs/rubs/gallops noted. Distant heart sounds. LUNGS: Lungs are clear to auscultation bilaterally, no apparent distress. No HSM or masses. ABDOMEN: soft, nontender, no rebound or guarding GU: no cva tenderness NEURO: Strong grasps. No muscle wasting. Reflexes are  normal. EXTREMITIES: pulses normal, full ROM SKIN: warm, color normal. No rash. Clubbing of his nails. PSYCH: Patient has an unusual affect. He is alert and cooperative.  [facility] (PRIMARY) x-ray report read by Dr. Milus Glazier: CXR - Appears to be normal with the exception of calcified perihilar nodes and mild spondylosis of the thoracic spine.   Results for orders placed or performed in visit on 09/04/15  POCT CBC  Result Value Ref Range   WBC 7.1 4.6 - 10.2 K/uL   Lymph, poc 1.9 0.6 - 3.4   POC LYMPH PERCENT 26.4 10 - 50 %L   MID (cbc) 0.4 0 - 0.9   POC MID % 5.8 0 - 12 %M   POC Granulocyte 4.8 2 - 6.9   Granulocyte percent 67.8 37 - 80 %G   RBC 5.08 4.69 - 6.13 M/uL   Hemoglobin 15.7 14.1 - 18.1 g/dL   HCT, POC 40.9 81.1 - 53.7 %   MCV 92.0 80 - 97 fL   MCH, POC 30.9 27 - 31.2 pg   MCHC 33.6 31.8 - 35.4 g/dL   RDW, POC 91.4 %   Platelet Count, POC 239 142 - 424 K/uL   MPV 6.3 0 - 99.8 fL  POCT glycosylated hemoglobin (Hb A1C)  Result Value Ref Range   Hemoglobin A1C 5.2   POCT glucose (manual entry)  Result Value Ref Range   POC Glucose 109 (A) 70 - 99 mg/dl  POCT rapid strep A  Result Value Ref Range   Rapid Strep A Screen Negative Negative       Assessment & Plan:   By signing my name below, I, Littie Deeds, attest that  this documentation has been prepared under the direction and in the presence of Elvina Sidle, MD.  Electronically Signed: Littie Deeds, Medical Scribe. 09/04/2015. 12:51 PM.  This chart was scribed in my presence and reviewed by me personally.    ICD-9-CM ICD-10-CM   1. Sore throat 462 J02.9 POCT CBC     POCT glycosylated hemoglobin (Hb A1C)     POCT glucose (manual entry)     POCT SEDIMENTATION RATE     POCT rapid strep A     COMPLETE METABOLIC PANEL WITH GFR     DG Chest 2 View     Culture, Group A Strep  2. Generalized weakness 780.79 R53.1 POCT CBC     POCT glycosylated hemoglobin (Hb A1C)     POCT glucose (manual entry)     POCT  SEDIMENTATION RATE     POCT rapid strep A     COMPLETE METABOLIC PANEL WITH GFR     DG Chest 2 View  3. Insomnia 780.52 G47.00 POCT CBC     POCT glycosylated hemoglobin (Hb A1C)     POCT glucose (manual entry)     POCT SEDIMENTATION RATE     POCT rapid strep A     COMPLETE METABOLIC PANEL WITH GFR     DG Chest 2 View  4. Shortness of breath 786.05 R06.02 POCT CBC     POCT glycosylated hemoglobin (Hb A1C)     POCT glucose (manual entry)     POCT SEDIMENTATION RATE     POCT rapid strep A     COMPLETE METABOLIC PANEL WITH GFR     DG Chest 2 View     Signed, Elvina Sidle, MD

## 2015-09-04 NOTE — Addendum Note (Signed)
Addended by: Elvina Sidle on: 09/04/2015 05:22 PM   Modules accepted: Orders

## 2015-09-05 LAB — CULTURE, GROUP A STREP: Organism ID, Bacteria: NORMAL

## 2015-09-06 ENCOUNTER — Telehealth: Payer: Self-pay

## 2015-09-06 NOTE — Telephone Encounter (Signed)
The patient called about the Ativan prescription that he was prescribed at his last OV.  He said it is not helping him fall asleep.  He wanted to know if he could take two tablets at the same time, since one is not working.  He said he's been taking a Xanax in addition to the Ativan and is able to get some sleep, but only a couple hours.  He said he's already taking two Ativan ~4 hours apart during the night in addition to the Xanax.  Please advise, thank you.  CB#: [phone]

## 2015-09-07 ENCOUNTER — Telehealth: Payer: Self-pay

## 2015-09-07 NOTE — Telephone Encounter (Signed)
Seen by Dr. Littie Deeds. Please advise.

## 2015-09-07 NOTE — Telephone Encounter (Signed)
Advised pt of message from Dr. Milus Glazier. Pt understood.

## 2015-09-07 NOTE — Telephone Encounter (Signed)
He states he is taking 3 pills and Xanax and this has not worked to get him to sleep. Dr. Annell Greening.

## 2015-09-07 NOTE — Telephone Encounter (Signed)
See previous message

## 2015-09-07 NOTE — Telephone Encounter (Signed)
Pt is needing to talk with someone about his sleep medication not working

## 2015-09-07 NOTE — Telephone Encounter (Signed)
Insomnia is a complex problem.  One can take medications, but at higher doses, the medicines interfere with REM sleep (deep, restorative sleep).  Before launching into heavier and heavier medicine, it is often helpful to examine the causes of insomnia:  1) Use of electronic stimulation within an hour of bedtime  2) Use of coffee and caffeine products  3) Lack of exercise  4) Poor diet  5) Stress in life, both personal and professional  6) indications for depression  7) sleep apnea or medications that interfere with sleep Eventually, all artifical sleep aids fail unless steps are taken to improve "sleep hygiene."  I would try changing some of the above before adding more and more medicine.  Electronic stimulation in particular is becoming epidemic and alters brain waves in ways that totally disrupt normal sleep patterns

## 2016-01-01 ENCOUNTER — Encounter: Payer: Self-pay | Admitting: Family Medicine

## 2016-01-06 ENCOUNTER — Encounter: Payer: Self-pay | Admitting: Family Medicine

## 2016-02-01 ENCOUNTER — Ambulatory Visit (INDEPENDENT_AMBULATORY_CARE_PROVIDER_SITE_OTHER): Payer: Managed Care, Other (non HMO) | Admitting: Family Medicine

## 2016-02-01 VITALS — BP 140/70 | HR 94 | Temp 97.9°F | Resp 20 | Ht 71.0 in | Wt 188.0 lb

## 2016-02-01 DIAGNOSIS — M25551 Pain in right hip: Secondary | ICD-10-CM

## 2016-02-01 DIAGNOSIS — B029 Zoster without complications: Secondary | ICD-10-CM | POA: Diagnosis not present

## 2016-02-01 DIAGNOSIS — R21 Rash and other nonspecific skin eruption: Secondary | ICD-10-CM | POA: Diagnosis not present

## 2016-02-01 MED ORDER — HYDROCODONE-ACETAMINOPHEN 5-325 MG PO TABS: 1.0000 | ORAL_TABLET | Freq: Four times a day (QID) | ORAL | Status: DC | PRN

## 2016-02-01 MED ORDER — VALACYCLOVIR HCL 1 G PO TABS
1000.0000 mg | ORAL_TABLET | Freq: Two times a day (BID) | ORAL | Status: DC
Start: 2016-02-01 — End: 2018-02-14

## 2016-02-01 NOTE — Patient Instructions (Signed)
Take the valacyclovir 1 g 3 times daily for 1 week for the shingles  Take the Norco 5/325 (hydrocodone) one every 4-6 hours as needed for severe pain  Return if further concerns  Shingles Shingles, which is also known as herpes zoster, is an infection that causes a painful skin rash and fluid-filled blisters. Shingles is not related to genital herpes, which is a sexually transmitted infection.   Shingles only develops in people who:  Have had chickenpox.  Have received the chickenpox vaccine. (This is rare.) CAUSES Shingles is caused by varicella-zoster virus (VZV). This is the same virus that causes chickenpox. After exposure to VZV, the virus stays in the body in an inactive (dormant) state. Shingles develops if the virus reactivates. This can happen many years after the initial exposure to VZV. It is not known what causes this virus to reactivate. RISK FACTORS People who have had chickenpox or received the chickenpox vaccine are at risk for shingles. Infection is more common in people who:  Are older than age 38.  Have a weakened defense (immune) system, such as those with HIV, AIDS, or cancer.  Are taking medicines that weaken the immune system, such as transplant medicines.  Are under great stress. SYMPTOMS Early symptoms of this condition include itching, tingling, and pain in an area on your skin. Pain may be described as burning, stabbing, or throbbing. A few days or weeks after symptoms start, a painful red rash appears, usually on one side of the body in a bandlike or beltlike pattern. The rash eventually turns into fluid-filled blisters that break open, scab over, and dry up in about 2-3 weeks. At any time during the infection, you may also develop:  A fever.  Chills.  A headache.  An upset stomach. DIAGNOSIS This condition is diagnosed with a skin exam. Sometimes, skin or fluid samples are taken from the blisters before a diagnosis is made. These samples are  examined under a microscope or sent to a lab for testing. TREATMENT There is no specific cure for this condition. Your health care provider will probably prescribe medicines to help you manage pain, recover more quickly, and avoid long-term problems. Medicines may include:  Antiviral drugs.  Anti-inflammatory drugs.  Pain medicines. If the area involved is on your face, you may be referred to a specialist, such as an eye doctor (ophthalmologist) or an ear, nose, and throat (ENT) doctor to help you avoid eye problems, chronic pain, or disability. HOME CARE INSTRUCTIONS Medicines  Take medicines only as directed by your health care provider.  Apply an anti-itch or numbing cream to the affected area as directed by your health care provider. Blister and Rash Care  Take a cool bath or apply cool compresses to the area of the rash or blisters as directed by your health care provider. This may help with pain and itching.  Keep your rash covered with a loose bandage (dressing). Wear loose-fitting clothing to help ease the pain of material rubbing against the rash.  Keep your rash and blisters clean with mild soap and cool water or as directed by your health care provider.  Check your rash every day for signs of infection. These include redness, swelling, and pain that lasts or increases.  Do not pick your blisters.  Do not scratch your rash. General Instructions  Rest as directed by your health care provider.  Keep all follow-up visits as directed by your health care provider. This is important.  Until your blisters scab over,  your infection can cause chickenpox in people who have never had it or been vaccinated against it. To prevent this from happening, avoid contact with other people, especially:  Babies.  Pregnant women.  Children who have eczema.  Elderly people who have transplants.  People who have chronic illnesses, such as leukemia or AIDS. SEEK MEDICAL CARE IF:  Your  pain is not relieved with prescribed medicines.  Your pain does not get better after the rash heals.  Your rash looks infected. Signs of infection include redness, swelling, and pain that lasts or increases. SEEK IMMEDIATE MEDICAL CARE IF:  The rash is on your face or nose.  You have facial pain, pain around your eye area, or loss of feeling on one side of your face.  You have ear pain or you have ringing in your ear.  You have loss of taste.  Your condition gets worse.   This information is not intended to replace advice given to you by your health care provider. Make sure you discuss any questions you have with your health care provider.   Document Released: 11/28/2005 Document Revised: 12/19/2014 Document Reviewed: 10/09/2014 Elsevier Interactive Patient Education [employer]

## 2016-02-01 NOTE — Progress Notes (Signed)
Patient ID: Jonathan Harrington, male    DOB: 1956/07/29  Age: 60 y.o. MRN: [id number]  Chief Complaint  Patient presents with  . Leg Pain    right side pain  . Abdominal Pain    Subjective:   Patient comes in today with a history of having developed pain in his right lateral hip and low abdomen area. He said he just came on with no known injury. He hurt terribly. He couldn't get relief even if he wasn't laying on it. He took some of his wife's tramadol and got a little bit of relief. Today he came on in after not resting well last night and got checked. When he came in he knows some little rash on his right hip area.  Current allergies, medications, problem list, past/family and social histories reviewed.  Objective:  BP 140/70 mmHg  Pulse 94  Temp(Src) 97.9 F (36.6 C) (Oral)  Resp 20  Ht  (1.803 m)  Wt 188 lb (85.276 kg)  BMI 26.23 kg/m2  SpO2 97%  Painful right iliac crest region with early patch of rash for 5 cm in diameter.  Assessment & Plan:   Assessment: 1. Shingles   2. Right hip pain   3. Rash       Plan: This is entirely consistent with shingles. Will treat accordingly.  No orders of the defined types were placed in this encounter.    Meds ordered this encounter  Medications  . valACYclovir (VALTREX) 1000 MG tablet    Sig: Take 1 tablet (1,000 mg total) by mouth 2 (two) times daily.    Dispense:  21 tablet    Refill:  0  . HYDROcodone-acetaminophen (NORCO) 5-325 MG tablet    Sig: Take 1 tablet by mouth every 6 (six) hours as needed.    Dispense:  20 tablet    Refill:  0         Patient Instructions  Take the valacyclovir 1 g 3 times daily for 1 week for the shingles  Take the Norco 5/325 (hydrocodone) one every 4-6 hours as needed for severe pain  Return if further concerns  Shingles Shingles, which is also known as herpes zoster, is an infection that causes a painful skin rash and fluid-filled blisters. Shingles is not related to genital  herpes, which is a sexually transmitted infection.   Shingles only develops in people who:  Have had chickenpox.  Have received the chickenpox vaccine. (This is rare.) CAUSES Shingles is caused by varicella-zoster virus (VZV). This is the same virus that causes chickenpox. After exposure to VZV, the virus stays in the body in an inactive (dormant) state. Shingles develops if the virus reactivates. This can happen many years after the initial exposure to VZV. It is not known what causes this virus to reactivate. RISK FACTORS People who have had chickenpox or received the chickenpox vaccine are at risk for shingles. Infection is more common in people who:  Are older than age 28.  Have a weakened defense (immune) system, such as those with HIV, AIDS, or cancer.  Are taking medicines that weaken the immune system, such as transplant medicines.  Are under great stress. SYMPTOMS Early symptoms of this condition include itching, tingling, and pain in an area on your skin. Pain may be described as burning, stabbing, or throbbing. A few days or weeks after symptoms start, a painful red rash appears, usually on one side of the body in a bandlike or beltlike pattern. The rash eventually  turns into fluid-filled blisters that break open, scab over, and dry up in about 2-3 weeks. At any time during the infection, you may also develop:  A fever.  Chills.  A headache.  An upset stomach. DIAGNOSIS This condition is diagnosed with a skin exam. Sometimes, skin or fluid samples are taken from the blisters before a diagnosis is made. These samples are examined under a microscope or sent to a lab for testing. TREATMENT There is no specific cure for this condition. Your health care provider will probably prescribe medicines to help you manage pain, recover more quickly, and avoid long-term problems. Medicines may include:  Antiviral drugs.  Anti-inflammatory drugs.  Pain medicines. If the area  involved is on your face, you may be referred to a specialist, such as an eye doctor (ophthalmologist) or an ear, nose, and throat (ENT) doctor to help you avoid eye problems, chronic pain, or disability. HOME CARE INSTRUCTIONS Medicines  Take medicines only as directed by your health care provider.  Apply an anti-itch or numbing cream to the affected area as directed by your health care provider. Blister and Rash Care  Take a cool bath or apply cool compresses to the area of the rash or blisters as directed by your health care provider. This may help with pain and itching.  Keep your rash covered with a loose bandage (dressing). Wear loose-fitting clothing to help ease the pain of material rubbing against the rash.  Keep your rash and blisters clean with mild soap and cool water or as directed by your health care provider.  Check your rash every day for signs of infection. These include redness, swelling, and pain that lasts or increases.  Do not pick your blisters.  Do not scratch your rash. General Instructions  Rest as directed by your health care provider.  Keep all follow-up visits as directed by your health care provider. This is important.  Until your blisters scab over, your infection can cause chickenpox in people who have never had it or been vaccinated against it. To prevent this from happening, avoid contact with other people, especially:  Babies.  Pregnant women.  Children who have eczema.  Elderly people who have transplants.  People who have chronic illnesses, such as leukemia or AIDS. SEEK MEDICAL CARE IF:  Your pain is not relieved with prescribed medicines.  Your pain does not get better after the rash heals.  Your rash looks infected. Signs of infection include redness, swelling, and pain that lasts or increases. SEEK IMMEDIATE MEDICAL CARE IF:  The rash is on your face or nose.  You have facial pain, pain around your eye area, or loss of feeling on  one side of your face.  You have ear pain or you have ringing in your ear.  You have loss of taste.  Your condition gets worse.   This information is not intended to replace advice given to you by your health care provider. Make sure you discuss any questions you have with your health care provider.   Document Released: 11/28/2005 Document Revised: 12/19/2014 Document Reviewed: 10/09/2014 Elsevier Interactive Patient Education [employer]        Return if symptoms worsen or fail to improve.   HOPPER,DAVID, MD 02/01/2016

## 2016-02-04 ENCOUNTER — Ambulatory Visit (INDEPENDENT_AMBULATORY_CARE_PROVIDER_SITE_OTHER): Payer: Managed Care, Other (non HMO) | Admitting: Family Medicine

## 2016-02-04 VITALS — BP 148/76 | HR 98 | Temp 97.5°F | Resp 16 | Ht 71.0 in | Wt 190.0 lb

## 2016-02-04 DIAGNOSIS — B029 Zoster without complications: Secondary | ICD-10-CM

## 2016-02-04 DIAGNOSIS — Z23 Encounter for immunization: Secondary | ICD-10-CM | POA: Diagnosis not present

## 2016-02-04 MED ORDER — HYDROCODONE-ACETAMINOPHEN 10-325 MG PO TABS: 1.0000 | ORAL_TABLET | Freq: Three times a day (TID) | ORAL | Status: DC | PRN

## 2016-02-04 NOTE — Patient Instructions (Signed)

## 2016-02-04 NOTE — Progress Notes (Signed)
This is a 60 year old gentleman with shingles on his right hip. He's symptoms developed a week ago Friday the 17th with burning and a few clusters vesicles. It progressed along the dermatome and inguinal region on the right side so that he was quite uncomfortable by Monday morning. He came to the office and was given Valtrex 1 g 3 times a day and Norco 5-3 25 to be taken every 4-6 hours.  The pain was not controlled with Norco and patient was not able to continue working this week. 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  Patient had the shingles vaccine 2 years ago. He is not aware of ever having had chickenpox however.  Objective: Patient has clusters of vesicles with underlying erythema scattered throughout his right hip and inguinal region along the T12 dermatome on the right.  Assessment: This chart was scribed in my presence and reviewed by me personally.    ICD-9-CM ICD-10-CM   1. Shingles outbreak 053.9 B02.9 HYDROcodone-acetaminophen (NORCO) 10-325 MG tablet  2. Need for prophylactic vaccination and inoculation against influenza V04.81 Z23 Flu Vaccine QUAD 36+ mos IM     Signed, Elvina Sidle, MD

## 2016-03-03 ENCOUNTER — Other Ambulatory Visit: Payer: Self-pay

## 2016-03-03 MED ORDER — ALLOPURINOL 300 MG PO TABS: 300.0000 mg | ORAL_TABLET | Freq: Every day | ORAL | Status: DC

## 2016-03-16 ENCOUNTER — Encounter: Payer: Self-pay | Admitting: Family Medicine

## 2016-03-21 ENCOUNTER — Ambulatory Visit (INDEPENDENT_AMBULATORY_CARE_PROVIDER_SITE_OTHER): Payer: Self-pay | Admitting: Family Medicine

## 2016-03-21 ENCOUNTER — Encounter: Payer: Self-pay | Admitting: Family Medicine

## 2016-03-21 VITALS — BP 142/76 | HR 100 | Temp 97.9°F | Ht 71.0 in | Wt 190.0 lb

## 2016-03-21 DIAGNOSIS — F411 Generalized anxiety disorder: Secondary | ICD-10-CM

## 2016-03-21 DIAGNOSIS — J029 Acute pharyngitis, unspecified: Secondary | ICD-10-CM

## 2016-03-21 DIAGNOSIS — F4321 Adjustment disorder with depressed mood: Secondary | ICD-10-CM

## 2016-03-21 LAB — POCT RAPID STREP A (OFFICE): Rapid Strep A Screen: NEGATIVE

## 2016-03-21 MED ORDER — AMOXICILLIN 875 MG PO TABS: 875.0000 mg | ORAL_TABLET | Freq: Two times a day (BID) | ORAL | Status: DC

## 2016-03-21 NOTE — Patient Instructions (Addendum)
We will treat you for your sore throat with amoxicillin antibiotic Let me know if you are not feeling better or if I can do anything to help!

## 2016-03-21 NOTE — Progress Notes (Signed)
Pre visit review using our clinic review tool, if applicable. No additional management support is needed unless otherwise documented below in the visit note. 

## 2016-03-21 NOTE — Progress Notes (Signed)
[facility] [address], [address] [place], [place] [address] [phone] [phone]  Date:  03/21/2016   Name:  Jonathan GainFarrell D Bracy   DOB:  12/04/1956   MRN:  [id number]  PCP:  Abbe AmsterdamOPLAND,Olamide Carattini, MD    Chief Complaint: Sore Throat   History of Present Illness:  Jonathan Harrington is a 60 y.o. very pleasant male patient who presents with the following:  Pt with history of diverticulitis s/p partial colectomy, tobacco abuse, gout, dyslipidemia and smoking. He recently lost his wife Jonathan Harrington who died at home following an operation.  Jonathan GlassingFarrell sadly found her in bed, apparently dead as she was cold.  He called 911 and was advised to put her on the floor and start CPR on her which he did until EMS arrived a few minutes later.   The whole ordeal was traumatic to him but he is getting by "as well as can be expected."  His sister is with him today- overall his family is being very supportive and involved.  He was married to Jonathan Harrington for 42 years.  He is really missing her He is not having suicidal ideas and has a good support system.  He discussed plans for his future- getting health insurance, going back to work, etc  He has noted a ST, feeling hot and cold, and feeling very tired. ST for about 2 weeks now.  His sister insisted that he come in to have his ST looked at  He does not have insurance right now as he was on Jonathan Harrington's plan but should be able to join his work plan the first of May  Patient Active Problem List   Diagnosis Date Noted  . Left inguinal hernia 05/23/2014  . Anxiety state, unspecified 09/17/2013  . Gout 01/17/2013  . NONSPEC ABN FINDNG RAD & OTH EXAM ABDOMINAL AREA 09/16/2010  . DIVERTICULOSIS-COLON 08/13/2010  . DIVERTICULITIS, COLON 08/13/2010  . ABDOMINAL PAIN-LLQ 08/13/2010  . Nonspecific (abnormal) findings on radiological and other examination of body structure 08/13/2010  . PERSONAL HX COLONIC POLYPS 08/13/2010  . NONSPCIFC ABN FINDING  RAD & OTH EXAM LUNG FIELD 08/13/2010    Past Medical History  Diagnosis Date  . Diverticulitis 2011    resulted in partial colectomy  . Hematuria   . Anxiety   . Wears glasses   . Full dentures   . Gout     Past Surgical History  Procedure Laterality Date  . Colon surgery  2011    partial colectomy  . Colonoscopy    . Finger arthroplasty  1990    rt index fx  . Inguinal hernia repair Left 05/27/2014    Procedure: LEFT INGUINAL HERNIA REPAIR WITH MESH;  Surgeon: Wilmon ArmsMatthew K. Corliss Skainssuei, MD;  Location: [facility];  Service: General;  Laterality: Left;  . Insertion of mesh Left 05/27/2014    Procedure: INSERTION OF MESH;  Surgeon: Wilmon ArmsMatthew K. Corliss Skainssuei, MD;  Location: [facility];  Service: General;  Laterality: Left;  . Hernia repair      Social History  Substance Use Topics  . Smoking status: Current Every Day Smoker -- 1.50 packs/day  . Smokeless tobacco: Never Used  . Alcohol Use: 12.6 oz/week    21 Cans of beer per week     Comment: about 3 beers daily- history of heavy liquor abuse    No family history on file.  Allergies  Allergen Reactions  . Ibuprofen Other (See Comments)  Unknown reaction    Medication list has been reviewed and updated.  Current Outpatient Prescriptions on File Prior to Visit  Medication Sig Dispense Refill  . allopurinol (ZYLOPRIM) 300 MG tablet Take 1 tablet (300 mg total) by mouth daily. 30 tablet 0  . ALPRAZolam (XANAX) 1 MG tablet Take 1 tablet (1 mg total) by mouth 2 (two) times daily. 180 tablet 1  . HYDROcodone-acetaminophen (NORCO) 10-325 MG tablet Take 1 tablet by mouth every 8 (eight) hours as needed. 30 tablet 0  . niacin (NIASPAN) 500 MG CR tablet Take 3 tablets (1,500 mg total) by mouth at bedtime. 270 tablet 2  . triamcinolone cream (KENALOG) 0.1 % Apply 1 application topically 2 (two) times daily. 90 g 2  . colchicine 0.6 MG tablet Take 2 pills, then take 1 an hour later.  May take 1 or 2 a day as needed  for gout prevention (Patient not taking: Reported on 02/04/2016) 30 tablet 0  . Multiple Vitamins-Minerals (MULTIVITAMIN WITH MINERALS) tablet Take 1 tablet by mouth daily. Reported on 03/21/2016    . valACYclovir (VALTREX) 1000 MG tablet Take 1 tablet (1,000 mg total) by mouth 2 (two) times daily. (Patient not taking: Reported on 03/21/2016) 21 tablet 0   No current facility-administered medications on file prior to visit.    Review of Systems:  As per HPI- otherwise negative.   Physical Examination: Filed Vitals:   03/21/16 1536  BP: 142/76  Pulse: 100  Temp: 97.9 F (36.6 C)   Filed Vitals:   03/21/16 1536  Height:  (1.803 m)  Weight: 190 lb (86.183 kg)   Body mass index is 26.51 kg/(m^2). Ideal Body Weight: Weight in (lb) to have BMI = 25: 178.9  GEN: WDWN, NAD, Non-toxic, A & O x 3, tobacco odor, looks well but is tearful when taking about Jonathan Harrington HEENT: Atraumatic, Normocephalic. Neck supple. No masses, No LAD.  Bilateral TM wnl, oropharynx inflamed but no exudate.  PEERL,EOMI.   Ears and Nose: No external deformity. CV: RRR, No M/G/R. No JVD. No thrill. No extra heart sounds. PULM: CTA B, no wheezes, crackles, rhonchi. No retractions. No resp. distress. No accessory muscle use. EXTR: No c/c/e NEURO Normal gait.  PSYCH: Normally interactive. Conversant.   Results for orders placed or performed in visit on 03/21/16  POCT rapid strep A  Result Value Ref Range   Rapid Strep A Screen Negative Negative   Strep is negative today  Assessment and Plan: Acute pharyngitis, unspecified etiology - Plan: POCT rapid strep A, amoxicillin (AMOXIL) 875 MG tablet, DISCONTINUED: amoxicillin (AMOXIL) 875 MG tablet  Grief  GAD (generalized anxiety disorder)  Discussed his grief and sadness regarding the loss of his wife.  I have known this couple for many years and always observed a loving and supportive relationship between them.  Let Jaleen know how sorry I am.   He has a  negative rapid strep but we will not do a culture as he does not have health insurance right now.  Decided to treat with amoxicillin given 2 week duration of ST  He will plan to let me know if his ST is not better and to see me in a few months for a recheck  Signed Abbe Amsterdam, MD  Meds ordered this encounter  Medications  . DISCONTD: amoxicillin (AMOXIL) 875 MG tablet    Sig: Take 1 tablet (875 mg total) by mouth 2 (two) times daily.    Dispense:  14 tablet  Refill:  0  . amoxicillin (AMOXIL) 875 MG tablet    Sig: Take 1 tablet (875 mg total) by mouth 2 (two) times daily.    Dispense:  14 tablet    Refill:  0

## 2016-04-07 ENCOUNTER — Other Ambulatory Visit: Payer: Self-pay | Admitting: Physician Assistant

## 2016-04-12 ENCOUNTER — Other Ambulatory Visit: Payer: Self-pay | Admitting: Physician Assistant

## 2016-04-18 ENCOUNTER — Other Ambulatory Visit: Payer: Self-pay | Admitting: Physician Assistant

## 2016-04-19 ENCOUNTER — Other Ambulatory Visit: Payer: Self-pay | Admitting: Family Medicine

## 2016-05-12 ENCOUNTER — Ambulatory Visit (INDEPENDENT_AMBULATORY_CARE_PROVIDER_SITE_OTHER): Payer: BLUE CROSS/BLUE SHIELD | Admitting: Physician Assistant

## 2016-05-12 VITALS — BP 199/97 | HR 102 | Temp 97.8°F | Resp 18 | Ht 71.0 in | Wt 182.8 lb

## 2016-05-12 DIAGNOSIS — R42 Dizziness and giddiness: Secondary | ICD-10-CM | POA: Diagnosis not present

## 2016-05-12 DIAGNOSIS — Z1159 Encounter for screening for other viral diseases: Secondary | ICD-10-CM

## 2016-05-12 DIAGNOSIS — M791 Myalgia, unspecified site: Secondary | ICD-10-CM

## 2016-05-12 DIAGNOSIS — Z720 Tobacco use: Secondary | ICD-10-CM | POA: Diagnosis not present

## 2016-05-12 DIAGNOSIS — R5383 Other fatigue: Secondary | ICD-10-CM | POA: Diagnosis not present

## 2016-05-12 DIAGNOSIS — J029 Acute pharyngitis, unspecified: Secondary | ICD-10-CM | POA: Diagnosis not present

## 2016-05-12 DIAGNOSIS — M1000 Idiopathic gout, unspecified site: Secondary | ICD-10-CM | POA: Diagnosis not present

## 2016-05-12 DIAGNOSIS — G479 Sleep disorder, unspecified: Secondary | ICD-10-CM | POA: Diagnosis not present

## 2016-05-12 DIAGNOSIS — F411 Generalized anxiety disorder: Secondary | ICD-10-CM

## 2016-05-12 DIAGNOSIS — Z114 Encounter for screening for human immunodeficiency virus [HIV]: Secondary | ICD-10-CM

## 2016-05-12 DIAGNOSIS — R809 Proteinuria, unspecified: Secondary | ICD-10-CM

## 2016-05-12 DIAGNOSIS — R1032 Left lower quadrant pain: Secondary | ICD-10-CM | POA: Diagnosis not present

## 2016-05-12 DIAGNOSIS — F4321 Adjustment disorder with depressed mood: Secondary | ICD-10-CM | POA: Diagnosis not present

## 2016-05-12 DIAGNOSIS — F17200 Nicotine dependence, unspecified, uncomplicated: Secondary | ICD-10-CM

## 2016-05-12 DIAGNOSIS — R824 Acetonuria: Secondary | ICD-10-CM

## 2016-05-12 DIAGNOSIS — B029 Zoster without complications: Secondary | ICD-10-CM

## 2016-05-12 DIAGNOSIS — R030 Elevated blood-pressure reading, without diagnosis of hypertension: Secondary | ICD-10-CM

## 2016-05-12 LAB — POC MICROSCOPIC URINALYSIS (UMFC): Mucus: ABSENT

## 2016-05-12 LAB — POCT URINALYSIS DIP (MANUAL ENTRY)
GLUCOSE UA: NEGATIVE
Nitrite, UA: NEGATIVE
Protein Ur, POC: >=300 — AB
RBC UA: NEGATIVE
SPEC GRAV UA: 1.02
UROBILINOGEN UA: 4
pH, UA: 7

## 2016-05-12 LAB — POCT CBC
Granulocyte percent: 70.5 %G (ref 37–80)
HCT, POC: 45.9 % (ref 43.5–53.7)
HEMOGLOBIN: 16.7 g/dL (ref 14.1–18.1)
Lymph, poc: 1.5 (ref 0.6–3.4)
MCH: 35.4 pg — AB (ref 27–31.2)
MCHC: 36.4 g/dL — AB (ref 31.8–35.4)
MCV: 97.3 fL — AB (ref 80–97)
MID (cbc): 0.6 (ref 0–0.9)
MPV: 6.7 fL (ref 0–99.8)
POC Granulocyte: 5 (ref 2–6.9)
POC LYMPH PERCENT: 21.6 %L (ref 10–50)
POC MID %: 7.9 % (ref 0–12)
Platelet Count, POC: 149 10*3/uL (ref 142–424)
RBC: 4.72 M/uL (ref 4.69–6.13)
RDW, POC: 16.6 %
WBC: 7.1 10*3/uL (ref 4.6–10.2)

## 2016-05-12 LAB — GLUCOSE, POCT (MANUAL RESULT ENTRY): POC Glucose: 83 mg/dl (ref 70–99)

## 2016-05-12 MED ORDER — SERTRALINE HCL 50 MG PO TABS: 50.0000 mg | ORAL_TABLET | Freq: Every day | ORAL | Status: DC

## 2016-05-12 MED ORDER — ALLOPURINOL 300 MG PO TABS: ORAL_TABLET | ORAL | Status: DC

## 2016-05-12 MED ORDER — ALPRAZOLAM 1 MG PO TABS: 1.0000 mg | ORAL_TABLET | Freq: Two times a day (BID) | ORAL | Status: DC

## 2016-05-12 MED ORDER — HYDROCODONE-ACETAMINOPHEN 10-325 MG PO TABS: 1.0000 | ORAL_TABLET | Freq: Three times a day (TID) | ORAL | Status: DC | PRN

## 2016-05-12 NOTE — Patient Instructions (Addendum)
1. Please call Dr. Cyndie Chimeopland's office to schedule a follow up vist with her.  2. Please make sure that you are drinking plenty of water every day.  3. Start the sertraline by taking 1/2 tablet each day for the first week, then increase to the whole tablet each day. Dr. Patsy Lageropland will adjust it if needed when you see her.    IF you received an x-ray today, you will receive an invoice from [facility]. Please contact [facility] at [phone] with questions or concerns regarding your invoice.   IF you received labwork today, you will receive an invoice from [facility]. Please contact [facility] at [phone] with questions or concerns regarding your invoice.   Our billing staff will not be able to assist you with questions regarding bills from these companies.  You will be contacted with the lab results as soon as they are available. The fastest way to get your results is to activate your My Chart account. Instructions are located on the last page of this paperwork. If you have not heard from us regarding the results in 2 weeks, please contact this office.

## 2016-05-12 NOTE — Progress Notes (Signed)
Patient ID: Jonathan Harrington, male    DOB: 01/26/1956, 60 y.o.   MRN: [id number]  PCP: Abbe AmsterdamOPLAND,JESSICA, MD  Subjective:   Chief Complaint  Patient presents with  . Dizziness    x 2days  . Generalized Body Aches    x2 days  . other    pt has not been getting a lot of sleep  . Fatigue    HPI Presents for evaluation of dizziness, myalgias, insomnia and fatigue.   Wife died 3/29. She did all their finances online, and he is struggling to figure it all out. Financial strain, took all his savings to pay for the funeral. He was on his wife's health insurance and there was a gap until he could get on at his job.  "I've had a sore throat every morning since my wife." He saw his PCP on 4/10 for same. Rapid strep test was negative. He was treated empirically for strep throat with amoxicillin. He reports that it made no difference. He reports that she called in another round of antibiotics, but I do not see that in his record.  Needs refills of several medications. Allopurinol, alprazolam, hydrocodone. Didn't tolerate the increase in Niaspan from 500 mg to 1500 mg (05/2015).  Has had to increase his Xanax use since his wife's death. "Sometimes I get the shakes just so bad. All I'm wanting to do is sleep. I've lost 6-7 pounds in the past couple of weeks. I can't do all this stuff myself. I ask for help, I try to pay people to help, and they just won't. By the time I get home from work I'm just so tired. I used to love to work."     Review of Systems Difficult to redirect patient to ROS. His primary complaint seems to be sore throat, and he isn't consistent regarding muscle or joint pain, dizziness. He repeatedly gets into a story about work or his wife or son, rather than answering even direct questions about symptoms. I do not think that he is experiencing CP, SOB, HA, Dizziness, nausea, vomiting, diarrhea, urinary symptoms or rash. I think that he is feeling overwhelming grief, insomnia,  fatigue, and joint/muscle aches.     Patient Active Problem List   Diagnosis Date Noted  . Left inguinal hernia 05/23/2014  . Anxiety state, unspecified 09/17/2013  . Gout 01/17/2013  . NONSPEC ABN FINDNG RAD & OTH EXAM ABDOMINAL AREA 09/16/2010  . DIVERTICULOSIS-COLON 08/13/2010  . DIVERTICULITIS, COLON 08/13/2010  . ABDOMINAL PAIN-LLQ 08/13/2010  . Nonspecific (abnormal) findings on radiological and other examination of body structure 08/13/2010  . PERSONAL HX COLONIC POLYPS 08/13/2010  . NONSPCIFC ABN FINDING RAD & OTH EXAM LUNG FIELD 08/13/2010     Prior to Admission medications   Medication Sig Start Date End Date Taking? Authorizing Provider  allopurinol (ZYLOPRIM) 300 MG tablet TAKE 1 TABLET (300 MG TOTAL) BY MOUTH DAILY. 04/20/16  Yes [name] [name], MD  ALPRAZolam (XANAX) 1 MG tablet Take 1 tablet (1 mg total) by mouth 2 (two) times daily. 05/21/15  Yes [name] [name], MD  HYDROcodone-acetaminophen (NORCO) 10-325 MG tablet Take 1 tablet by mouth every 8 (eight) hours as needed. 02/04/16  Yes Elvina SidleKurt Lauenstein, MD  Multiple Vitamins-Minerals (MULTIVITAMIN WITH MINERALS) tablet Take 1 tablet by mouth daily. Reported on 03/21/2016   Yes Historical Provider, MD  niacin (NIASPAN) 500 MG CR tablet Take 3 tablets (1,500 mg total) by mouth at bedtime. 03/10/15  Yes Pearline CablesJessica C [name], MD  triamcinolone  cream (KENALOG) 0.1 % Apply 1 application topically 2 (two) times daily. 10/02/14  Yes Gwenlyn Found [name], MD  colchicine 0.6 MG tablet Take 2 pills, then take 1 an hour later.  May take 1 or 2 a day as needed for gout prevention Patient not taking: Reported on 02/04/2016 02/19/14   Pearline Cables, MD  valACYclovir (VALTREX) 1000 MG tablet Take 1 tablet (1,000 mg total) by mouth 2 (two) times daily. Patient not taking: Reported on 03/21/2016 02/01/16   Peyton Najjar, MD     Allergies  Allergen Reactions  . Ibuprofen Other (See Comments)    Unknown reaction       Objective:    Physical Exam  Constitutional: He is oriented to person, place, and time. He appears well-developed and well-nourished. He is active and cooperative. No distress.  BP 199/97 mmHg  Pulse 102  Temp(Src) 97.8 F (36.6 C) (Oral)  Resp 18  Ht  (1.803 m)  Wt 182 lb 12.8 oz (82.918 kg)  BMI 25.51 kg/m2  SpO2 97%  HENT:  Head: Normocephalic and atraumatic.  Right Ear: Hearing, tympanic membrane, external ear and ear canal normal.  Left Ear: Hearing, tympanic membrane, external ear and ear canal normal.  Nose: Right sinus exhibits no maxillary sinus tenderness and no frontal sinus tenderness. Left sinus exhibits no maxillary sinus tenderness and no frontal sinus tenderness.  Mouth/Throat: Uvula is midline, oropharynx is clear and moist and mucous membranes are normal. No oral lesions. No uvula swelling. No oropharyngeal exudate.  Eyes: Conjunctivae, EOM and lids are normal. Pupils are equal, round, and reactive to light. No scleral icterus.  Neck: Normal range of motion, full passive range of motion without pain and phonation normal. Neck supple. No thyromegaly present.  Cardiovascular: Normal rate, regular rhythm and normal heart sounds.   Pulses:      Radial pulses are 2+ on the right side, and 2+ on the left side.  Pulmonary/Chest: Effort normal and breath sounds normal.  Abdominal: Normal appearance and bowel sounds are normal. He exhibits no distension and no mass. There is no hepatosplenomegaly. There is tenderness in the left lower quadrant. There is no rigidity, no rebound, no guarding, no tenderness at McBurney's point and negative Murphy's sign.  Lymphadenopathy:       Head (right side): No tonsillar, no preauricular, no posterior auricular and no occipital adenopathy present.       Head (left side): No tonsillar, no preauricular, no posterior auricular and no occipital adenopathy present.    He has no cervical adenopathy.       Right: No supraclavicular adenopathy present.        Left: No supraclavicular adenopathy present.  Neurological: He is alert and oriented to person, place, and time. He has normal strength. No sensory deficit.  Skin: Skin is warm, dry and intact. No rash noted. No cyanosis or erythema. Nails show clubbing (yellowed, consistent with tobacco use).  Psychiatric: His speech is normal and behavior is normal. Judgment and thought content normal. His mood appears not anxious. His affect is not angry, not blunt, not labile and not inappropriate. Cognition and memory are normal. He exhibits a depressed mood.     EKG reviewed with Dr. Katrinka Blazing. NSR.   Results for orders placed or performed in visit on 05/12/16  POCT CBC  Result Value Ref Range   WBC 7.1 4.6 - 10.2 K/uL   Lymph, poc 1.5 0.6 - 3.4   POC LYMPH PERCENT 21.6 10 -  50 %L   MID (cbc) 0.6 0 - 0.9   POC MID % 7.9 0 - 12 %M   POC Granulocyte 5.0 2 - 6.9   Granulocyte percent 70.5 37 - 80 %G   RBC 4.72 4.69 - 6.13 M/uL   Hemoglobin 16.7 14.1 - 18.1 g/dL   HCT, POC 16.1 09.6 - 53.7 %   MCV 97.3 (A) 80 - 97 fL   MCH, POC 35.4 (A) 27 - 31.2 pg   MCHC 36.4 (A) 31.8 - 35.4 g/dL   RDW, POC 04.5 %   Platelet Count, POC 149 142 - 424 K/uL   MPV 6.7 0 - 99.8 fL  POCT glucose (manual entry)  Result Value Ref Range   POC Glucose 83 70 - 99 mg/dl  POCT urinalysis dipstick  Result Value Ref Range   Color, UA orange (A) yellow   Clarity, UA clear clear   Glucose, UA negative negative   Bilirubin, UA small (A) negative   Ketones, POC UA moderate (40) (A) negative   Spec Grav, UA 1.020    Blood, UA negative negative   pH, UA 7.0    Protein Ur, POC >=300 (A) negative   Urobilinogen, UA 4.0    Nitrite, UA Negative Negative   Leukocytes, UA Trace (A) Negative  POCT Microscopic Urinalysis ([facility])  Result Value Ref Range   WBC,UR,HPF,POC Few (A) None WBC/hpf   RBC,UR,HPF,POC None None RBC/hpf   Bacteria None None, Too numerous to count   Mucus Absent Absent   Epithelial Cells, UR Per Microscopy  None None, Too numerous to count cells/hpf       Assessment & Plan:   1. Sore throat I suspect that this is due to lack of sleep. No specific additional evaluation/treatment recommended.  2. Dizziness Not really clear that he is having dizziness, or if this is extreme fatigue due to sleep deprivation, and probably less than adequate hydration/nutrition.  - POCT CBC - POCT glucose (manual entry) - POCT urinalysis dipstick - POCT Microscopic Urinalysis ([facility]) - Comprehensive metabolic panel - TSH - EKG 12-Lead  3. Grief This is the primary issue at this time. Start SSRI. Refill alprazolam, at the dose he is actually taking. Encouraged him to follow-up with PCP in the next several weeks. - sertraline (ZOLOFT) 50 MG tablet; Take 1 tablet (50 mg total) by mouth daily.  Dispense: 30 tablet; Refill: 3 - ALPRAZolam (XANAX) 1 MG tablet; Take 1-2 tablets (1-2 mg total) by mouth 2 (two) times daily.  Dispense: 120 tablet; Refill: 0  4. Myalgia See above. - HYDROcodone-acetaminophen (NORCO) 10-325 MG tablet; Take 1 tablet by mouth every 8 (eight) hours as needed.  Dispense: 30 tablet; Refill: 0  5. Sleep disturbance See above.  6. Other fatigue See above.  7. ABDOMINAL PAIN-LLQ He's not complaining of this, but noted on exam. He does has a history of diverticulosis/itis and this could represent an early evolving diverticulitis, but elect to provide supportive care for now, with instructions to RTC if worsens.  8. Idiopathic gout, unspecified chronicity, unspecified site - allopurinol (ZYLOPRIM) 300 MG tablet; TAKE 1 TABLET (300 MG TOTAL) BY MOUTH DAILY.  Dispense: 30 tablet; Refill: 5  9. Screening for HIV (human immunodeficiency virus) - HIV antibody  10. Need for hepatitis C screening test - Hepatitis C antibody  11. Elevated blood pressure reading without diagnosis of hypertension Recheck with his PCP at the next visit.  12. Smoker Encouraged smoking cessation.  13. GAD  (generalized anxiety  disorder) Exacerbated by his grief. - ALPRAZolam (XANAX) 1 MG tablet; Take 1-2 tablets (1-2 mg total) by mouth 2 (two) times daily.  Dispense: 120 tablet; Refill: 0  14. Proteinuria 15. Ketonuria Likely due to inadequate hydration and nutrition. Await labs. Follow-up with PCP.   Fernande Bras, PA-C Physician Assistant-Certified [facility]

## 2016-05-13 LAB — HEPATITIS C ANTIBODY: HCV AB: NEGATIVE

## 2016-05-13 LAB — COMPREHENSIVE METABOLIC PANEL
ALBUMIN: 4.3 g/dL (ref 3.6–5.1)
ALK PHOS: 63 U/L (ref 40–115)
ALT: 25 U/L (ref 9–46)
AST: 41 U/L — ABNORMAL HIGH (ref 10–35)
BUN: 10 mg/dL (ref 7–25)
CALCIUM: 9.1 mg/dL (ref 8.6–10.3)
CHLORIDE: 103 mmol/L (ref 98–110)
CO2: 23 mmol/L (ref 20–31)
Creat: 0.8 mg/dL (ref 0.70–1.25)
Glucose, Bld: 83 mg/dL (ref 65–99)
POTASSIUM: 4.1 mmol/L (ref 3.5–5.3)
Sodium: 143 mmol/L (ref 135–146)
TOTAL PROTEIN: 7.5 g/dL (ref 6.1–8.1)
Total Bilirubin: 1.3 mg/dL — ABNORMAL HIGH (ref 0.2–1.2)

## 2016-05-13 LAB — TSH: TSH: 0.55 m[IU]/L (ref 0.40–4.50)

## 2016-05-13 LAB — HIV ANTIBODY (ROUTINE TESTING W REFLEX): HIV: NONREACTIVE

## 2016-05-16 ENCOUNTER — Encounter: Payer: Self-pay | Admitting: Family Medicine

## 2016-05-16 ENCOUNTER — Ambulatory Visit (INDEPENDENT_AMBULATORY_CARE_PROVIDER_SITE_OTHER): Payer: BLUE CROSS/BLUE SHIELD | Admitting: Family Medicine

## 2016-05-16 VITALS — BP 145/90 | HR 93 | Temp 98.1°F | Ht 72.0 in | Wt 182.0 lb

## 2016-05-16 DIAGNOSIS — F411 Generalized anxiety disorder: Secondary | ICD-10-CM

## 2016-05-16 DIAGNOSIS — F4321 Adjustment disorder with depressed mood: Secondary | ICD-10-CM | POA: Diagnosis not present

## 2016-05-16 NOTE — Patient Instructions (Addendum)
[facility] Address: [address], [place] Contact: [phone] Description: Free workshop for adults, call for dates and information [facility] Address: [address], [place] Contact: [phone] Description: Free workshop for adults, call for dates and information [facility] Address: [address] [place] Contact: [phone] Description: Free workshop for adults, call for dates and information [facility] Address: Various locations in [place], [place], [place] Contact: Investment banker, [email or web address] Description: [facility] Lions[facility]riefShare is a friendly, caring group of people who will walk alongside you through one of life's most difficult experiences. You don't have to go through the grieving process alone. Grief Support Group Address: [facility], [address] [place] Contact: Facilitator Janice L. Dwain SarnaWakefield, M.Div., [facility], at [phone] Description: While grief is a natural process following a loss, information and support can help people cope and heal. This group is open to families of [facility] patients and members of the community and it will meet for a series of seven sessions. New members accepted at the beginning of the group. Space is limited. For further information and to register for the group, please call facilitator Liborio NixonJanice L. Wakefield, M.Div., [facility], at [phone].  Please look into a grief support group- you might try one of the options above but there are more in this area! Please try to get outside daily and take a walk, preferably in the sunshine.   Plan some simple activities that you enjoy (a movie or good meal).   Continue to use your xanax and the sertraline.  We may want to increase your sertraline to 100 mg after a couple of weeks.    Please come and see me in 2-3 weeks to  check on your progress If you are not doing ok please seek help!

## 2016-05-16 NOTE — Progress Notes (Signed)
Pre visit review using our clinic review tool, if applicable. No additional management support is needed unless otherwise documented below in the visit note. 

## 2016-05-16 NOTE — Progress Notes (Signed)
Healthcare at [facility] [address] [place], [place] [address] [phone]  Date:  05/16/2016   Name:  Jonathan AMIRAULT   DOB:  [birth year]   MRN:  [id number]  PCP:  Abbe Amsterdam, MD    Chief Complaint: Anxiety and Depression   History of Present Illness:  Jonathan Harrington is a 60 y.o. very pleasant male patient who presents with the following:  Approx 2 months ago Jonathan Harrington came home from work and found that his wife Jonathan Harrington had passed away.  He saw me shortly afterwards with complaint really just of ST; at that time he was grieving as expected but otherwise ok.  He saw Porfirio Oar PA-C last Friday with complaint of persistent sore throat, but in fact it seemed that his sx were due to worsening sx of depression and  grief.  She refilled his xanax and started him on zoloft which I agree was a good idea.  He was noted to be dehydrated at his visit due to poor oral intake.  He is trying to do better in this regard  Wt Readings from Last 3 Encounters:  05/16/16 182 lb (82.555 kg)  05/12/16 182 lb 12.8 oz (82.918 kg)  03/21/16 190 lb (86.183 kg)   He has not been eating like normal and has lose some weight   He is now taking xanax 1mg , 1-2 mg twice daily. He notes that "I don't want to do nothing, all I want to do is sleep."  He has a hard time concentrating and keeping on a task.  He notes that he is having more trouble with thinking about the time of Delanas death  He did go back to work but is having a hard time keeping his mind on his job. His job gave him full time hours and health insurance so he is grateful.   He does not feel like he has a lot of support- admits that he does not have a lot of friend to talk to Denies any suicidal intent or plans   Results for orders placed or performed in visit on 05/12/16  Comprehensive metabolic panel  Result Value Ref Range   Sodium 143 135 - 146 mmol/L   Potassium 4.1 3.5 - 5.3 mmol/L   Chloride 103 98 - 110 mmol/L   CO2 23 20 - 31 mmol/L   Glucose, Bld 83 65 - 99 mg/dL   BUN 10 7 - 25 mg/dL   Creat 2.53 6.64 - 4.03 mg/dL   Total Bilirubin 1.3 (H) 0.2 - 1.2 mg/dL   Alkaline Phosphatase 63 40 - 115 U/L   AST 41 (H) 10 - 35 U/L   ALT 25 9 - 46 U/L   Total Protein 7.5 6.1 - 8.1 g/dL   Albumin 4.3 3.6 - 5.1 g/dL   Calcium 9.1 8.6 - 47.4 mg/dL  TSH  Result Value Ref Range   TSH 0.55 0.40 - 4.50 mIU/L  Hepatitis C antibody  Result Value Ref Range   HCV Ab NEGATIVE NEGATIVE  HIV antibody  Result Value Ref Range   HIV 1&2 Ab, 4th Generation NONREACTIVE NONREACTIVE  POCT CBC  Result Value Ref Range   WBC 7.1 4.6 - 10.2 K/uL   Lymph, poc 1.5 0.6 - 3.4   POC LYMPH PERCENT 21.6 10 - 50 %L   MID (cbc) 0.6 0 - 0.9   POC MID % 7.9 0 - 12 %M   POC Granulocyte  5.0 2 - 6.9   Granulocyte percent 70.5 37 - 80 %G   RBC 4.72 4.69 - 6.13 M/uL   Hemoglobin 16.7 14.1 - 18.1 g/dL   HCT, POC 16.145.9 09.643.5 - 53.7 %   MCV 97.3 (A) 80 - 97 fL   MCH, POC 35.4 (A) 27 - 31.2 pg   MCHC 36.4 (A) 31.8 - 35.4 g/dL   RDW, POC 04.516.6 %   Platelet Count, POC 149 142 - 424 K/uL   MPV 6.7 0 - 99.8 fL  POCT glucose (manual entry)  Result Value Ref Range   POC Glucose 83 70 - 99 mg/dl  POCT urinalysis dipstick  Result Value Ref Range   Color, UA orange (A) yellow   Clarity, UA clear clear   Glucose, UA negative negative   Bilirubin, UA small (A) negative   Ketones, POC UA moderate (40) (A) negative   Spec Grav, UA 1.020    Blood, UA negative negative   pH, UA 7.0    Protein Ur, POC >=300 (A) negative   Urobilinogen, UA 4.0    Nitrite, UA Negative Negative   Leukocytes, UA Trace (A) Negative  POCT Microscopic Urinalysis ([facility])  Result Value Ref Range   WBC,UR,HPF,POC Few (A) None WBC/hpf   RBC,UR,HPF,POC None None RBC/hpf   Bacteria None None, Too numerous to count   Mucus Absent Absent   Epithelial Cells, UR Per Microscopy None None, Too numerous to count cells/hpf     Patient  Active Problem List   Diagnosis Date Noted  . Smoker 05/12/2016  . Left inguinal hernia 05/23/2014  . Anxiety state, unspecified 09/17/2013  . Gout 01/17/2013  . NONSPEC ABN FINDNG RAD & OTH EXAM ABDOMINAL AREA 09/16/2010  . DIVERTICULOSIS-COLON 08/13/2010  . DIVERTICULITIS, COLON 08/13/2010  . ABDOMINAL PAIN-LLQ 08/13/2010  . Nonspecific (abnormal) findings on radiological and other examination of body structure 08/13/2010  . PERSONAL HX COLONIC POLYPS 08/13/2010  . NONSPCIFC ABN FINDING RAD & OTH EXAM LUNG FIELD 08/13/2010    Past Medical History  Diagnosis Date  . Diverticulitis 2011    resulted in partial colectomy  . Hematuria   . Anxiety   . Wears glasses   . Full dentures   . Gout     Past Surgical History  Procedure Laterality Date  . Colon surgery  2011    partial colectomy  . Colonoscopy    . Finger arthroplasty  1990    rt index fx  . Inguinal hernia repair Left 05/27/2014    Procedure: LEFT INGUINAL HERNIA REPAIR WITH MESH;  Surgeon: Wilmon ArmsMatthew K. Corliss Skainssuei, MD;  Location: [facility];  Service: General;  Laterality: Left;  . Insertion of mesh Left 05/27/2014    Procedure: INSERTION OF MESH;  Surgeon: Wilmon ArmsMatthew K. Corliss Skainssuei, MD;  Location: [facility];  Service: General;  Laterality: Left;  . Hernia repair      Social History  Substance Use Topics  . Smoking status: Current Every Day Smoker -- 1.50 packs/day  . Smokeless tobacco: Never Used  . Alcohol Use: 12.6 oz/week    21 Cans of beer per week     Comment: about 3 beers daily- history of heavy liquor abuse    Family History  Problem Relation Age of Onset  . Alzheimer's disease Mother   . Cancer Sister     Allergies  Allergen Reactions  . Ibuprofen Other (See Comments)    Unknown reaction    Medication list has been reviewed and  updated.  Current Outpatient Prescriptions on File Prior to Visit  Medication Sig Dispense Refill  . allopurinol (ZYLOPRIM) 300 MG tablet TAKE 1  TABLET (300 MG TOTAL) BY MOUTH DAILY. 30 tablet 5  . ALPRAZolam (XANAX) 1 MG tablet Take 1-2 tablets (1-2 mg total) by mouth 2 (two) times daily. 120 tablet 0  . colchicine 0.6 MG tablet Take 2 pills, then take 1 an hour later.  May take 1 or 2 a day as needed for gout prevention (Patient not taking: Reported on 02/04/2016) 30 tablet 0  . HYDROcodone-acetaminophen (NORCO) 10-325 MG tablet Take 1 tablet by mouth every 8 (eight) hours as needed. 30 tablet 0  . Multiple Vitamins-Minerals (MULTIVITAMIN WITH MINERALS) tablet Take 1 tablet by mouth daily. Reported on 03/21/2016    . niacin (NIASPAN) 500 MG CR tablet Take 3 tablets (1,500 mg total) by mouth at bedtime. 270 tablet 2  . sertraline (ZOLOFT) 50 MG tablet Take 1 tablet (50 mg total) by mouth daily. 30 tablet 3  . triamcinolone cream (KENALOG) 0.1 % Apply 1 application topically 2 (two) times daily. 90 g 2  . valACYclovir (VALTREX) 1000 MG tablet Take 1 tablet (1,000 mg total) by mouth 2 (two) times daily. (Patient not taking: Reported on 03/21/2016) 21 tablet 0   No current facility-administered medications on file prior to visit.    Review of Systems:  As per HPI- otherwise negative.   Physical Examination: Filed Vitals:   05/16/16 0933  BP: 176/90  Pulse: 93  Temp: 98.1 F (36.7 C)   Ideal Body Weight:    GEN: WDWN, NAD, Non-toxic, A & O x 3, look well but subdued  HEENT: Atraumatic, Normocephalic. Neck supple. No masses, No LAD. Ears and Nose: No external deformity. CV: RRR, No M/G/R. No JVD. No thrill. No extra heart sounds. PULM: CTA B, no wheezes, crackles, rhonchi. No retractions. No resp. distress. No accessory muscle use. EXTR: No c/c/e NEURO Normal gait.  PSYCH: Normally interactive. Conversant. Not depressed or anxious appearing.  Calm demeanor.    Assessment and Plan: Grief reaction  Anxiety state  Here today to follow-up on his grief symptoms following the death of his wife Continue zoloft, and will likely  increase his dose at our next visit He is using xanax as above Encouraged exercise- preferably outdoor- daily  Also encouraged good self- care and diet Encouraged him to try and keep on his normal schedule- he plans to go to work after he leaves here today Given resources for grief support groups Follow-up in 2-3 weeks   Signed Abbe Amsterdam, MD

## 2016-06-06 ENCOUNTER — Encounter: Payer: Self-pay | Admitting: Family Medicine

## 2016-06-06 ENCOUNTER — Ambulatory Visit (HOSPITAL_BASED_OUTPATIENT_CLINIC_OR_DEPARTMENT_OTHER)
Admission: RE | Admit: 2016-06-06 | Discharge: 2016-06-06 | Disposition: A | Discharge status: Home / Self Care | Payer: BLUE CROSS/BLUE SHIELD | Source: Ambulatory Visit | Attending: Family Medicine | Admitting: Family Medicine

## 2016-06-06 ENCOUNTER — Ambulatory Visit (INDEPENDENT_AMBULATORY_CARE_PROVIDER_SITE_OTHER): Payer: BLUE CROSS/BLUE SHIELD | Admitting: Family Medicine

## 2016-06-06 VITALS — BP 190/110 | HR 59 | Temp 98.1°F | Ht 72.0 in | Wt 181.2 lb

## 2016-06-06 DIAGNOSIS — M4856XA Collapsed vertebra, not elsewhere classified, lumbar region, initial encounter for fracture: Secondary | ICD-10-CM | POA: Insufficient documentation

## 2016-06-06 DIAGNOSIS — M545 Low back pain, unspecified: Secondary | ICD-10-CM

## 2016-06-06 DIAGNOSIS — M47896 Other spondylosis, lumbar region: Secondary | ICD-10-CM | POA: Diagnosis not present

## 2016-06-06 DIAGNOSIS — F4321 Adjustment disorder with depressed mood: Secondary | ICD-10-CM | POA: Diagnosis not present

## 2016-06-06 DIAGNOSIS — M791 Myalgia, unspecified site: Secondary | ICD-10-CM

## 2016-06-06 DIAGNOSIS — I700 Atherosclerosis of aorta: Secondary | ICD-10-CM | POA: Insufficient documentation

## 2016-06-06 DIAGNOSIS — M1000 Idiopathic gout, unspecified site: Secondary | ICD-10-CM

## 2016-06-06 DIAGNOSIS — E785 Hyperlipidemia, unspecified: Secondary | ICD-10-CM

## 2016-06-06 DIAGNOSIS — I10 Essential (primary) hypertension: Secondary | ICD-10-CM | POA: Insufficient documentation

## 2016-06-06 HISTORY — DX: Essential (primary) hypertension: I10

## 2016-06-06 MED ORDER — LISINOPRIL 10 MG PO TABS: 10.0000 mg | ORAL_TABLET | Freq: Every day | ORAL | Status: DC

## 2016-06-06 MED ORDER — HYDROCODONE-ACETAMINOPHEN 10-325 MG PO TABS: 1.0000 | ORAL_TABLET | Freq: Two times a day (BID) | ORAL | Status: DC

## 2016-06-06 MED ORDER — ALLOPURINOL 300 MG PO TABS: ORAL_TABLET | ORAL | Status: DC

## 2016-06-06 MED ORDER — SERTRALINE HCL 100 MG PO TABS: 100.0000 mg | ORAL_TABLET | Freq: Every day | ORAL | Status: DC

## 2016-06-06 NOTE — Progress Notes (Signed)
Pre visit review using our clinic review tool, if applicable. No additional management support is needed unless otherwise documented below in the visit note. 

## 2016-06-06 NOTE — Patient Instructions (Signed)
It was good to see you today! I am glad that you are doing well Please do downstairs and have x-rays of your back before you leave today I refilled your pain medication to use for your back I also increased your zoloft to 100 mg a day  Please schedule a lab visit and come in for a FASTING cholesterol panel at your convenience in the next couple of weeks  Your blood pressure is much too high.  We are going to start lisinopril 10 mg for you.  Please take this once a day.   Please check your BP at the drug store and let me know how it looks in one week

## 2016-06-06 NOTE — Progress Notes (Signed)
[facility] [address], [address] [place], [place] [address] [phone]  Date:  06/06/2016   Name:  Jonathan Harrington   DOB:  08/26/1956   MRN:  [id number]  PCP:  Abbe AmsterdamOPLAND,Kahley Leib, MD    Chief Complaint: Follow-up   History of Present Illness:  Jonathan GainFarrell D Bir is a 60 y.o. very pleasant male patient who presents with the following:  Here today to follow-up on grief, anxiety and depression. His wife Delana died in April of this year.  At our last visit he was showing some improvement on his zoloft.  He continues to feel like he is doing ok and making slow improvement.  He is back at work, which is a great thing for him. His main social network is at work and being there keeps his mind off harder things.  "I wish I could work on the weekend, all day and all night." His appetite is still not good- he will sometimes have to force himself to eat His sleep is getting better- he is resting better and is able to get out of bed in the morning again.   His weight is still down about 15 lbs from when Delana died.    He is taking just 1,000 mg of niacin daily.  He is not able to take 1500 mg as it causes him to have a rash and flushing   He describes chronic back pain over the last 4-5 months.  NKI, will get worse when he sits all day like at work.   He is using norco - started taking this a few months ago for back pain.  He uses this twice a day at this point. Would like a refill of this medication  His BP is up a lot- he was on BP medication several years ago but stopped it when his BP got too low. This was several years ago and he cannot remember what he took in the past   Wt Readings from Last 3 Encounters:  06/06/16 181 lb 3.2 oz (82.192 kg)  05/16/16 182 lb (82.555 kg)  05/12/16 182 lb 12.8 oz (82.918 kg)     BP Readings from Last 3 Encounters:  06/06/16 206/95  05/16/16 145/90  05/12/16 199/97     Patient Active Problem List   Diagnosis Date Noted  . Smoker 05/12/2016  . Left inguinal hernia 05/23/2014  . Anxiety state, unspecified 09/17/2013  . Gout 01/17/2013  . NONSPEC ABN FINDNG RAD & OTH EXAM ABDOMINAL AREA 09/16/2010  . DIVERTICULOSIS-COLON 08/13/2010  . DIVERTICULITIS, COLON 08/13/2010  . ABDOMINAL PAIN-LLQ 08/13/2010  . Nonspecific (abnormal) findings on radiological and other examination of body structure 08/13/2010  . PERSONAL HX COLONIC POLYPS 08/13/2010  . NONSPCIFC ABN FINDING RAD & OTH EXAM LUNG FIELD 08/13/2010    Past Medical History  Diagnosis Date  . Diverticulitis 2011    resulted in partial colectomy  . Hematuria   . Anxiety   . Wears glasses   . Full dentures   . Gout     Past Surgical History  Procedure Laterality Date  . Colon surgery  2011    partial colectomy  . Colonoscopy    . Finger arthroplasty  1990    rt index fx  . Inguinal hernia repair Left 05/27/2014    Procedure: LEFT INGUINAL HERNIA REPAIR WITH MESH;  Surgeon: Wilmon ArmsMatthew K. Corliss Skainssuei, MD;  Location: [facility];  Service: General;  Laterality:  Left;  . Insertion of mesh Left 05/27/2014    Procedure: INSERTION OF MESH;  Surgeon: Wilmon ArmsMatthew K. Corliss Skainssuei, MD;  Location: [facility];  Service: General;  Laterality: Left;  . Hernia repair      Social History  Substance Use Topics  . Smoking status: Current Every Day Smoker -- 1.50 packs/day  . Smokeless tobacco: Never Used  . Alcohol Use: 12.6 oz/week    21 Cans of beer per week     Comment: about 3 beers daily- history of heavy liquor abuse    Family History  Problem Relation Age of Onset  . Alzheimer's disease Mother   . Cancer Sister     Allergies  Allergen Reactions  . Ibuprofen Other (See Comments)    Unknown reaction    Medication list has been reviewed and updated.  Current Outpatient Prescriptions on File Prior to Visit  Medication Sig Dispense Refill  . allopurinol (ZYLOPRIM) 300 MG tablet TAKE 1 TABLET (300 MG TOTAL) BY  MOUTH DAILY. 30 tablet 5  . ALPRAZolam (XANAX) 1 MG tablet Take 1-2 tablets (1-2 mg total) by mouth 2 (two) times daily. 120 tablet 0  . colchicine 0.6 MG tablet Take 2 pills, then take 1 an hour later.  May take 1 or 2 a day as needed for gout prevention 30 tablet 0  . HYDROcodone-acetaminophen (NORCO) 10-325 MG tablet Take 1 tablet by mouth every 8 (eight) hours as needed. (Patient taking differently: Take 1 tablet by mouth 2 (two) times daily. ) 30 tablet 0  . Multiple Vitamins-Minerals (MULTIVITAMIN WITH MINERALS) tablet Take 1 tablet by mouth daily. Reported on 03/21/2016    . niacin (NIASPAN) 500 MG CR tablet Take 3 tablets (1,500 mg total) by mouth at bedtime. (Patient taking differently: Take 2 tablets by mouth at bedtime) 270 tablet 2  . sertraline (ZOLOFT) 50 MG tablet Take 1 tablet (50 mg total) by mouth daily. 30 tablet 3  . triamcinolone cream (KENALOG) 0.1 % Apply 1 application topically 2 (two) times daily. 90 g 2  . valACYclovir (VALTREX) 1000 MG tablet Take 1 tablet (1,000 mg total) by mouth 2 (two) times daily. 21 tablet 0   No current facility-administered medications on file prior to visit.    Review of Systems:  As per HPI- otherwise negative.   Physical Examination: Filed Vitals:   06/06/16 1539 06/06/16 1546  BP: 172/82 206/95  Pulse: 59   Temp: 98.1 F (36.7 C)    Filed Vitals:   06/06/16 1539  Height: 6' (1.829 m)  Weight: 181 lb 3.2 oz (82.192 kg)   Body mass index is 24.57 kg/(m^2). Ideal Body Weight: Weight in (lb) to have BMI = 25: 183.9  GEN: WDWN, NAD, Non-toxic, A & O x 3, looks well HEENT: Atraumatic, Normocephalic. Neck supple. No masses, No LAD. Ears and Nose: No external deformity. CV: RRR, No M/G/R. No JVD. No thrill. No extra heart sounds. PULM: CTA B, no wheezes, crackles, rhonchi. No retractions. No resp. distress. No accessory muscle use. ABD: S, NT, ND, +BS. No rebound. No HSM. EXTR: No c/c/e NEURO Normal gait.  PSYCH: Normally  interactive. Conversant. Not depressed or anxious appearing.  Calm demeanor.  He notes the mid lumbar back as the area of concern Normal BLE strength and sensation, normal DTR bilaterally.  He has normal lumbar extension, flexion is about 75% normal  Negative SLR    Assessment and Plan: Idiopathic gout, unspecified chronicity, unspecified site - Plan: allopurinol (ZYLOPRIM) 300  MG tablet  Grief - Plan: sertraline (ZOLOFT) 100 MG tablet  Myalgia  Bilateral low back pain without sciatica - Plan: HYDROcodone-acetaminophen (NORCO) 10-325 MG tablet, DG Lumbar Spine Complete  Accelerated hypertension - Plan: lisinopril (PRINIVIL,ZESTRIL) 10 MG tablet  Dyslipidemia - Plan: Lipid panel  Here today for a recheck and to discuss his back pain Refilled medications as needed Increased zoloft to 100 mg He is making good progress through his grief Start lisinopril 10 mg for uncontrolled BP.  He has a home cuff and will report back to me with some readings in 1-2 weeks Sent for films of his lumbar spine today Refilled his norco   Signed Abbe Amsterdam, MD

## 2016-06-10 ENCOUNTER — Other Ambulatory Visit: Payer: Self-pay | Admitting: Family Medicine

## 2016-06-10 DIAGNOSIS — M25551 Pain in right hip: Secondary | ICD-10-CM

## 2016-06-10 DIAGNOSIS — M1000 Idiopathic gout, unspecified site: Secondary | ICD-10-CM

## 2016-06-10 MED ORDER — ALLOPURINOL 300 MG PO TABS: ORAL_TABLET | ORAL | Status: DC

## 2016-06-10 NOTE — Progress Notes (Signed)
Called him to discuss recent back x-rays.  Dg Lumbar Spine Complete  06/07/2016  CLINICAL DATA:  Back pain. EXAM: LUMBAR SPINE - COMPLETE 4+ VIEW COMPARISON:  01/17/2013.  CT 10/08/2010. FINDINGS: Degenerative changes lumbar spine with scoliosis concave left. Sclerotic changes noted in the right femoral head. Avascular necrosis cannot be excluded. Stable mild L2 compression fracture . Aortic atherosclerotic vascular calcification. Right nephrolithiasis cannot be excluded. Splenic calcifications consistent granulomas noted. IMPRESSION: 1. Diffuse multilevel degenerative change lumbar spine. Stable mild L2 compression fracture. 2. Avascular necrosis of the right femoral head cannot be excluded . 3.  Right nephrolithiasis cannot be excluded. 4.  Aortic atherosclerosis. Electronically Signed   By: Maisie Fushomas  Register   On: 06/07/2016 08:05   He does have degenerative change in his spine. 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 On questioning he does admit to pain in the right hip and groin. Will order dedicated hip films which he will have done asap. I will then be in touch with his results. He states understanding

## 2016-06-21 ENCOUNTER — Telehealth: Payer: Self-pay | Admitting: Family Medicine

## 2016-06-21 DIAGNOSIS — I10 Essential (primary) hypertension: Secondary | ICD-10-CM

## 2016-06-21 MED ORDER — LISINOPRIL 20 MG PO TABS: 20.0000 mg | ORAL_TABLET | Freq: Every day | ORAL | Status: DC

## 2016-06-21 NOTE — Telephone Encounter (Signed)
Called him to remind about hip x-rays.  He will get these soon, has not had time with work.  He reports that his SBP is about 160 with the 10 mg of lisiopril that we started at last visit.  Asked him to increase to 20 mg and he will keep me posted   BP Readings from Last 3 Encounters:  06/06/16 190/110  05/16/16 145/90  05/12/16 199/97    Meds ordered this encounter  Medications  . lisinopril (PRINIVIL,ZESTRIL) 20 MG tablet    Sig: Take 1 tablet (20 mg total) by mouth daily.    Dispense:  30 tablet    Refill:  6

## 2016-06-29 ENCOUNTER — Telehealth: Payer: Self-pay | Admitting: Family Medicine

## 2016-06-29 DIAGNOSIS — F4321 Adjustment disorder with depressed mood: Secondary | ICD-10-CM

## 2016-06-29 NOTE — Telephone Encounter (Signed)
Called him- reminded to come and get hip films soon.  He was confused and did not realize that I had sent in rx for 100 mg zoloft.  He though they were still the 50 and had been taking 2 of these.  Reassured that 200 mg is not a dangerous dose but he will scale back to just taking 100 mg daily

## 2016-06-29 NOTE — Telephone Encounter (Signed)
°  Relation to ZO:XWRUpt:self Call back number:[phone] Pharmacy: [id number]- [place], [place] - [address] [phone] (Phone) [phone])         Reason for call:  Patient requesting antidepressant medication, patient states PCP was going to increase MG. Please advise

## 2016-07-27 ENCOUNTER — Ambulatory Visit (INDEPENDENT_AMBULATORY_CARE_PROVIDER_SITE_OTHER): Payer: BLUE CROSS/BLUE SHIELD | Admitting: Family Medicine

## 2016-07-27 ENCOUNTER — Other Ambulatory Visit: Payer: Self-pay | Admitting: Family Medicine

## 2016-07-27 ENCOUNTER — Ambulatory Visit (HOSPITAL_BASED_OUTPATIENT_CLINIC_OR_DEPARTMENT_OTHER)
Admission: RE | Admit: 2016-07-27 | Discharge: 2016-07-27 | Disposition: A | Discharge status: Home / Self Care | Payer: BLUE CROSS/BLUE SHIELD | Source: Ambulatory Visit | Attending: Family Medicine | Admitting: Family Medicine

## 2016-07-27 ENCOUNTER — Encounter: Payer: Self-pay | Admitting: Family Medicine

## 2016-07-27 VITALS — BP 180/96 | HR 95 | Temp 98.5°F | Ht 72.0 in | Wt 184.0 lb

## 2016-07-27 DIAGNOSIS — R937 Abnormal findings on diagnostic imaging of other parts of musculoskeletal system: Secondary | ICD-10-CM | POA: Insufficient documentation

## 2016-07-27 DIAGNOSIS — M25551 Pain in right hip: Secondary | ICD-10-CM | POA: Diagnosis not present

## 2016-07-27 DIAGNOSIS — Z79899 Other long term (current) drug therapy: Secondary | ICD-10-CM | POA: Diagnosis not present

## 2016-07-27 DIAGNOSIS — F411 Generalized anxiety disorder: Secondary | ICD-10-CM

## 2016-07-27 DIAGNOSIS — F17200 Nicotine dependence, unspecified, uncomplicated: Secondary | ICD-10-CM

## 2016-07-27 DIAGNOSIS — M545 Low back pain, unspecified: Secondary | ICD-10-CM

## 2016-07-27 DIAGNOSIS — F4321 Adjustment disorder with depressed mood: Secondary | ICD-10-CM

## 2016-07-27 DIAGNOSIS — E785 Hyperlipidemia, unspecified: Secondary | ICD-10-CM

## 2016-07-27 DIAGNOSIS — I10 Essential (primary) hypertension: Secondary | ICD-10-CM | POA: Diagnosis not present

## 2016-07-27 DIAGNOSIS — Z79891 Long term (current) use of opiate analgesic: Secondary | ICD-10-CM | POA: Diagnosis not present

## 2016-07-27 MED ORDER — LISINOPRIL 40 MG PO TABS: 40.0000 mg | ORAL_TABLET | Freq: Every day | ORAL | 6 refills | Status: DC

## 2016-07-27 MED ORDER — VARENICLINE TARTRATE 0.5 MG X 11 & 1 MG X 42 PO MISC: ORAL | 0 refills | Status: DC

## 2016-07-27 MED ORDER — VARENICLINE TARTRATE 1 MG PO TABS: 1.0000 mg | ORAL_TABLET | Freq: Two times a day (BID) | ORAL | 3 refills | Status: DC

## 2016-07-27 MED ORDER — HYDROCODONE-ACETAMINOPHEN 10-325 MG PO TABS: 1.0000 | ORAL_TABLET | Freq: Two times a day (BID) | ORAL | 0 refills | Status: DC

## 2016-07-27 MED ORDER — ALPRAZOLAM 1 MG PO TABS: 1.0000 mg | ORAL_TABLET | Freq: Two times a day (BID) | ORAL | 0 refills | Status: DC

## 2016-07-27 NOTE — Progress Notes (Signed)
Pre visit review using our clinic review tool, if applicable. No additional management support is needed unless otherwise documented below in the visit note. 

## 2016-07-27 NOTE — Progress Notes (Signed)
[facility] [address], [address] [place], [place] [address] [phone] [phone]  Date:  07/27/2016   Name:  Jonathan GainFarrell D Petties   DOB:  04/27/1956   MRN:  [id number]  PCP:  Abbe AmsterdamOPLAND,Venezia Sargeant, MD    Chief Complaint: Medication follow up (Pt will need refill on Alprazolam and Hydrocodone- Acetaminophen. Pt would also like to discuss starting Chantix. )   History of Present Illness:  Jonathan Harrington is a 60 y.o. very pleasant male patient who presents with the following:  He is here today for a recheck. His wife passed away earlier this year, but he is adjusting well at this time.  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 He is taking hydrocodone twice daily for back pain and hip pain. He has not yet had his hip x-rays done due to cost concern- his recent lumbar spine films mentioned possible AVN of the right hip.  Apparently x-rays are under his deductible and he had to pay the full cost of his recent back films.   06/06/16 IMPRESSION: 1. Diffuse multilevel degenerative change lumbar spine. Stable mild L2 compression fracture. 2. Avascular necrosis of the right femoral head cannot be excluded . 3.  Right nephrolithiasis cannot be excluded. 4.  Aortic atherosclerosis.  He needs refills of his pain medication, and his xanax is approaching time for a RF but not yet out.  He is using hydrocodone BID, and also uses xanax 1-2 mg bid as needed Reviewed [facility] vicodin 10, #60 rx on 6/26, and xanax 1mg #120 rx on 6/1  Also he would like to try and quit smoking.  He has not tried chantix in the past- however a co-worker recently used chantix to quit and he would like to use this.  Discussed how to use this medication and he would like to give this a try    BP Readings from Last 3 Encounters:  07/27/16 (!) 180/96  06/06/16 (!)  190/110  05/16/16 (!) 145/90   He did take his BP medication today.  We started him on 10 mg of lisinopril in June, then increased to 20 mg. He admits that he has not checked his BP in the last couple of weeks so he is not sure how it is doing at home. He has not noted any CP or HA.    He has noted some softer stool, but not diarrhea.  He did have some diarrhea when he first started the zoloft after [name] died.   Patient Active Problem List   Diagnosis Date Noted  . Essential hypertension 06/06/2016  . Smoker 05/12/2016  . Left inguinal hernia 05/23/2014  . Anxiety state, unspecified 09/17/2013  . Gout 01/17/2013  . NONSPEC ABN FINDNG RAD & OTH EXAM ABDOMINAL AREA 09/16/2010  . DIVERTICULOSIS-COLON 08/13/2010  . DIVERTICULITIS, COLON 08/13/2010  . ABDOMINAL PAIN-LLQ 08/13/2010  . Nonspecific (abnormal) findings on radiological and other examination of body structure 08/13/2010  . PERSONAL HX COLONIC POLYPS 08/13/2010  . NONSPCIFC ABN FINDING RAD & OTH EXAM LUNG FIELD 08/13/2010    Past Medical History:  Diagnosis Date  . Anxiety   . Diverticulitis 2011   resulted in partial colectomy  . Essential hypertension 06/06/2016  . Full dentures   . Gout   . Hematuria   . Wears glasses  Past Surgical History:  Procedure Laterality Date  . COLON SURGERY  2011   partial colectomy  . COLONOSCOPY    . FINGER ARTHROPLASTY  1990   rt index fx  . HERNIA REPAIR    . INGUINAL HERNIA REPAIR Left 05/27/2014   Procedure: LEFT INGUINAL HERNIA REPAIR WITH MESH;  Surgeon: Wilmon ArmsMatthew K. Corliss Skainssuei, MD;  Location: [facility];  Service: General;  Laterality: Left;  . INSERTION OF MESH Left 05/27/2014   Procedure: INSERTION OF MESH;  Surgeon: Wilmon ArmsMatthew K. Corliss Skainssuei, MD;  Location: [facility];  Service: General;  Laterality: Left;    Social History  Substance Use Topics  . Smoking status: Current Every Day Smoker    Packs/day: 1.50  . Smokeless tobacco: Never Used  . Alcohol  use 12.6 oz/week    21 Cans of beer per week     Comment: about 3 beers daily- history of heavy liquor abuse    Family History  Problem Relation Age of Onset  . Alzheimer's disease Mother   . Cancer Sister     Allergies  Allergen Reactions  . Ibuprofen Other (See Comments)    Unknown reaction    Medication list has been reviewed and updated.  Current Outpatient Prescriptions on File Prior to Visit  Medication Sig Dispense Refill  . allopurinol (ZYLOPRIM) 300 MG tablet TAKE 1 TABLET (300 MG TOTAL) BY MOUTH DAILY. 90 tablet 3  . ALPRAZolam (XANAX) 1 MG tablet Take 1-2 tablets (1-2 mg total) by mouth 2 (two) times daily. 120 tablet 0  . colchicine 0.6 MG tablet Take 2 pills, then take 1 an hour later.  May take 1 or 2 a day as needed for gout prevention 30 tablet 0  . HYDROcodone-acetaminophen (NORCO) 10-325 MG tablet Take 1 tablet by mouth 2 (two) times daily. 60 tablet 0  . lisinopril (PRINIVIL,ZESTRIL) 20 MG tablet Take 1 tablet (20 mg total) by mouth daily. 30 tablet 6  . Multiple Vitamins-Minerals (MULTIVITAMIN WITH MINERALS) tablet Take 1 tablet by mouth daily. Reported on 03/21/2016    . sertraline (ZOLOFT) 100 MG tablet Take 1 tablet (100 mg total) by mouth daily. 30 tablet 11  . triamcinolone cream (KENALOG) 0.1 % Apply 1 application topically 2 (two) times daily. 90 g 2  . valACYclovir (VALTREX) 1000 MG tablet Take 1 tablet (1,000 mg total) by mouth 2 (two) times daily. 21 tablet 0   No current facility-administered medications on file prior to visit.     Review of Systems:  As per HPI- otherwise negative.   Physical Examination: Vitals:   07/27/16 1551 07/27/16 1555  BP: (!) 172/96 (!) 180/96  Pulse: 95   Temp: 98.5 F (36.9 C)    Vitals:   07/27/16 1551  Weight: 184 lb (83.5 kg)  Height: 6' (1.829 m)   Body mass index is 24.95 kg/m. Ideal Body Weight: Weight in (lb) to have BMI = 25: 183.9  GEN: WDWN, NAD, Non-toxic, A & O x 3, looks well, in good  spirits today HEENT: Atraumatic, Normocephalic. Neck supple. No masses, No LAD. Ears and Nose: No external deformity. CV: RRR, No M/G/R. No JVD. No thrill. No extra heart sounds. PULM: CTA B, no wheezes, crackles, rhonchi. No retractions. No resp. distress. No accessory muscle use. EXTR: No c/c/e NEURO Normal gait.  PSYCH: Normally interactive. Conversant. Not depressed or anxious appearing.  Calm demeanor.    Assessment and Plan: Accelerated hypertension - Plan: lisinopril (PRINIVIL,ZESTRIL) 40 MG tablet  Dyslipidemia  Bilateral low back pain without sciatica - Plan: HYDROcodone-acetaminophen (NORCO) 10-325 MG tablet, DISCONTINUED: HYDROcodone-acetaminophen (NORCO) 10-325 MG tablet  GAD (generalized anxiety disorder) - Plan: ALPRAZolam (XANAX) 1 MG tablet  Grief - Plan: ALPRAZolam (XANAX) 1 MG tablet  Tobacco use disorder - Plan: varenicline (CHANTIX STARTING MONTH PAK) 0.5 MG X 11 & 1 MG X 42 tablet, varenicline (CHANTIX CONTINUING MONTH PAK) 1 MG tablet  Here today with a few concerns His BP is still too high.  Increase lisinopril to 40 mg and he will monitor his BP, will keep me updated Discussed how to use chantix, rx for him today Asked him to get his hip films done asap as if he does have AVN this needs to be addressed- he plans to do his films today He is now on chronic hydrocodone for hip and back pain, as well as xanax for anxiety. Increased his dose of xanax in June when his grief was very severe- would like to try and taper down again son Asked him to do a UDS today and he agrees.   Meds ordered this encounter  Medications  . lisinopril (PRINIVIL,ZESTRIL) 40 MG tablet    Sig: Take 1 tablet (40 mg total) by mouth daily.    Dispense:  30 tablet    Refill:  6  . DISCONTD: HYDROcodone-acetaminophen (NORCO) 10-325 MG tablet    Sig: Take 1 tablet by mouth 2 (two) times daily.    Dispense:  60 tablet    Refill:  0  . ALPRAZolam (XANAX) 1 MG tablet    Sig: Take 1-2 tablets  (1-2 mg total) by mouth 2 (two) times daily.    Dispense:  120 tablet    Refill:  0  . HYDROcodone-acetaminophen (NORCO) 10-325 MG tablet    Sig: Take 1 tablet by mouth 2 (two) times daily. May fill in 30 days    Dispense:  60 tablet    Refill:  0  . varenicline (CHANTIX STARTING MONTH PAK) 0.5 MG X 11 & 1 MG X 42 tablet    Sig: Take one 0.5 mg tablet by mouth once daily for 3 days, then increase to one 0.5 mg tablet twice daily for 4 days, then increase to one 1 mg tablet twice daily.    Dispense:  53 tablet    Refill:  0  . varenicline (CHANTIX CONTINUING MONTH PAK) 1 MG tablet    Sig: Take 1 tablet (1 mg total) by mouth 2 (two) times daily.    Dispense:  60 tablet    Refill:  3     Signed Abbe Amsterdam, MD

## 2016-07-27 NOTE — Patient Instructions (Signed)
It was great to see you today- I am glad that you are doing well!   Start the starter pack of the chantix one week prior to your desired quit date.  Continue using it for 2-3 months total as needed- until you feel like you no longer are in danger of smoking relapse  Please do get your hip films done soon!  If you do have avascular necrosis we need to know as this can lead to more severe joint issues.    I gave you 2 rx for the hydrocodone today for this month and next month.   You can refill your xanax when it it needed.

## 2016-07-30 ENCOUNTER — Telehealth: Payer: Self-pay | Admitting: Family Medicine

## 2016-07-30 DIAGNOSIS — M87052 Idiopathic aseptic necrosis of left femur: Principal | ICD-10-CM

## 2016-07-30 DIAGNOSIS — M87051 Idiopathic aseptic necrosis of right femur: Secondary | ICD-10-CM

## 2016-07-30 NOTE — Telephone Encounter (Signed)
Called and left detailed message.  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  This is prob why he has noted back pain.  I am going to refer him to ortho- he likely does need surgery for this issue.  Let me know if any questions or if he does not hear about this appt

## 2016-08-03 ENCOUNTER — Encounter: Payer: Self-pay | Admitting: Family Medicine

## 2016-08-03 NOTE — Telephone Encounter (Signed)
Received his drug screen.  Pt is getting xanax and hydrocodone from me, I have been rx hydrocodone for him regularly as of late for hip and back pain. rx for norco on 05/12/16, 06/06/16, 07/27/16.  He is positive for xanax as expected, but his hydrocodone and hydromorphone are negative, his oxycodone is positive.   Called [facility] to make sure that the oxycodone is not a metabolite of hydrocodone- it is not.  Message to pt with results, please let me know why this might be if he can

## 2016-08-21 NOTE — Telephone Encounter (Signed)
Called pt as he had not yet read the [employer] message.  Asked about his recent UDS. He reports that he was taking some of his late wife's leftover percocet and this is why it showed up in his urine.  He will not do this any longer and we will repeat his UDS at our next visit

## 2016-09-06 ENCOUNTER — Telehealth: Payer: Self-pay | Admitting: Family Medicine

## 2016-09-06 NOTE — Telephone Encounter (Signed)
Patient called to let us know that he wakes up around 2:00am every morning with diarrhea and then has it again at 5:00am when he gets up for work. He is taking an anti diarrhea medication but still has it at work as well. Patient states that he has not had a solid stool in 3-4 weeks. He is wondering if it is from a medication or not.  Patient phone: [phone] Patient Relation: Self

## 2016-09-06 NOTE — Telephone Encounter (Signed)
If he is talking about the chantix -it can cause diarrhea in a sm percentage of the population

## 2016-09-06 NOTE — Telephone Encounter (Signed)
Dr. Laury AxonLowne please advise Pc

## 2016-09-07 ENCOUNTER — Other Ambulatory Visit: Payer: Self-pay | Admitting: Physician Assistant

## 2016-09-07 DIAGNOSIS — F4321 Adjustment disorder with depressed mood: Secondary | ICD-10-CM

## 2016-09-07 DIAGNOSIS — F411 Generalized anxiety disorder: Secondary | ICD-10-CM

## 2016-09-08 ENCOUNTER — Telehealth: Payer: Self-pay | Admitting: Family Medicine

## 2016-09-08 ENCOUNTER — Other Ambulatory Visit: Payer: Self-pay | Admitting: Emergency Medicine

## 2016-09-08 DIAGNOSIS — F4321 Adjustment disorder with depressed mood: Secondary | ICD-10-CM

## 2016-09-08 DIAGNOSIS — F411 Generalized anxiety disorder: Secondary | ICD-10-CM

## 2016-09-08 MED ORDER — ALPRAZOLAM 1 MG PO TABS: 1.0000 mg | ORAL_TABLET | Freq: Two times a day (BID) | ORAL | 1 refills | Status: DC

## 2016-09-08 NOTE — Telephone Encounter (Signed)
Patient is requesting refill of ALPRAZolam Prudy Feeler(XANAX) 1 MG tablet  Patient phone: [phone] Pharmacy:  [id number] [place], [place] - [address]

## 2016-09-08 NOTE — Telephone Encounter (Deleted)
Received refill request for ALPRAZolam (XANAX) 1 MG tablet. Last office visit and refill on 07/27/2016. Will it be ok to refill? Please advise.

## 2016-09-08 NOTE — Telephone Encounter (Signed)
Received refill request for ALPRAZolam (XANAX) 1 MG tablet. Last visit and refill on 07/27/16. Will it be ok to refill? Please advise.

## 2016-09-13 NOTE — Telephone Encounter (Signed)
Called the pt back 2 times no answer. Message was left to call the office back. PC

## 2016-09-14 ENCOUNTER — Other Ambulatory Visit: Payer: Self-pay | Admitting: Family Medicine

## 2016-09-14 DIAGNOSIS — F17200 Nicotine dependence, unspecified, uncomplicated: Secondary | ICD-10-CM

## 2016-09-14 NOTE — Telephone Encounter (Signed)
Received refill request for CHANTIX STARTING MONTH BOX.   Last office visit and refill on 07/27/2016. Will it be ok to refill? Please advise.

## 2016-09-23 ENCOUNTER — Encounter: Payer: Self-pay | Admitting: Family Medicine

## 2016-09-23 ENCOUNTER — Telehealth: Payer: Self-pay | Admitting: Family Medicine

## 2016-09-23 NOTE — Telephone Encounter (Signed)
Pt called in he says that he has been having diarrhea for the last month since taking Zoloft. He says that he would like to be advised on what he should do.

## 2016-10-06 ENCOUNTER — Telehealth: Payer: Self-pay | Admitting: Family Medicine

## 2016-10-06 ENCOUNTER — Encounter: Payer: Self-pay | Admitting: Family Medicine

## 2016-10-06 DIAGNOSIS — G8929 Other chronic pain: Secondary | ICD-10-CM

## 2016-10-06 DIAGNOSIS — M545 Low back pain: Principal | ICD-10-CM

## 2016-10-06 NOTE — Telephone Encounter (Signed)
Self. Refill for HYDROcodone.    CB: [phone]

## 2016-10-06 NOTE — Telephone Encounter (Signed)
Pt called in to follow up on message sent. He says that he never received a call back. Spoke with RN. She says that she will call and follow up with pt.     CB: [phone]

## 2016-10-06 NOTE — Telephone Encounter (Signed)
Discussed with pt in detail on the phone.  He relates that when he was on 50 mg of zoloft in the past he did fine, but when he recently (back in June) increased to 100 mg it seemed to cause persistent diarrhea.  He has cut back to 50 mg and is somewhat better.  He wants to stop taking it- I advised him to taper to 25 for 1-2 weeks prior to stopping.  He will call us and schedule an appt in the next few weeks to check on his progress

## 2016-10-06 NOTE — Telephone Encounter (Signed)
Pt called in to let us know he does not use MyChart. I have deactivated his account. He is taking half tablet daily and still having diarrhea. Please advise.

## 2016-10-06 NOTE — Telephone Encounter (Signed)
Last OV: 07/27/16 Last filled: 07/27/16, #60, 0 RF  Sig: Take 1 tablet by mouth 2 (two) times daily. May fill in 30 days UDS: 07/27/16, positive for Xanax, negative for hydrocodone, high risk.

## 2016-10-06 NOTE — Telephone Encounter (Signed)
MyChart deactivated. Will forward phone note and rx request to PCP for further recommendation.

## 2016-10-06 NOTE — Telephone Encounter (Signed)
Patient states he cannot use mychart his wife set the account up before she passed way he said he is breaking the medication in half but that is not helping and he is also wanting a refill for his pain medication for his back. Please advise [phone]

## 2016-10-06 NOTE — Telephone Encounter (Signed)
Dr. Patsy Lageropland emailed pt via MyChart same day. Called pt and left message to check MyChart and return call to office if any questions/concerns.  --  Jonathan CablesJessica C [name], MD  to Jonathan GainFarrell D Harrington   09/23/16 5:07 PM  Hi Jonathan GlassingFarrell- do you want to try decreasing your dose of zoloft to 50 mg? That may help. Are you able to break the medication in half?  JC

## 2016-10-07 MED ORDER — HYDROCODONE-ACETAMINOPHEN 10-325 MG PO TABS: 1.0000 | ORAL_TABLET | Freq: Two times a day (BID) | ORAL | 0 refills | Status: DC

## 2016-10-07 NOTE — Telephone Encounter (Signed)
Refilled- asked pt for a UDS today

## 2016-10-20 ENCOUNTER — Telehealth: Payer: Self-pay | Admitting: Family Medicine

## 2016-10-20 ENCOUNTER — Encounter (HOSPITAL_BASED_OUTPATIENT_CLINIC_OR_DEPARTMENT_OTHER): Payer: Self-pay | Admitting: Emergency Medicine

## 2016-10-20 ENCOUNTER — Emergency Department (HOSPITAL_BASED_OUTPATIENT_CLINIC_OR_DEPARTMENT_OTHER)
Admission: EM | Admit: 2016-10-20 | Discharge: 2016-10-20 | Disposition: A | Discharge status: Home / Self Care | Payer: BLUE CROSS/BLUE SHIELD | Attending: Emergency Medicine | Admitting: Emergency Medicine

## 2016-10-20 DIAGNOSIS — F17200 Nicotine dependence, unspecified, uncomplicated: Secondary | ICD-10-CM | POA: Insufficient documentation

## 2016-10-20 DIAGNOSIS — R197 Diarrhea, unspecified: Secondary | ICD-10-CM | POA: Insufficient documentation

## 2016-10-20 DIAGNOSIS — R5383 Other fatigue: Secondary | ICD-10-CM | POA: Diagnosis not present

## 2016-10-20 DIAGNOSIS — I10 Essential (primary) hypertension: Secondary | ICD-10-CM | POA: Insufficient documentation

## 2016-10-20 DIAGNOSIS — F331 Major depressive disorder, recurrent, moderate: Secondary | ICD-10-CM | POA: Diagnosis not present

## 2016-10-20 LAB — COMPREHENSIVE METABOLIC PANEL
ALBUMIN: 4.2 g/dL (ref 3.5–5.0)
ALK PHOS: 44 U/L (ref 38–126)
ALT: 17 U/L (ref 17–63)
ANION GAP: 10 (ref 5–15)
AST: 26 U/L (ref 15–41)
BUN: 11 mg/dL (ref 6–20)
CALCIUM: 9.1 mg/dL (ref 8.9–10.3)
CO2: 22 mmol/L (ref 22–32)
Chloride: 105 mmol/L (ref 101–111)
Creatinine, Ser: 0.75 mg/dL (ref 0.61–1.24)
GFR calc Af Amer: >60 mL/min (ref >60)
GFR calc non Af Amer: >60 mL/min (ref >60)
GLUCOSE: 99 mg/dL (ref 65–99)
POTASSIUM: 3.8 mmol/L (ref 3.5–5.1)
SODIUM: 137 mmol/L (ref 135–145)
Total Bilirubin: 1 mg/dL (ref 0.3–1.2)
Total Protein: 7.5 g/dL (ref 6.5–8.1)

## 2016-10-20 LAB — MAGNESIUM: MAGNESIUM: 1.6 mg/dL — AB (ref 1.7–2.4)

## 2016-10-20 LAB — CBC WITH DIFFERENTIAL/PLATELET
BASOS ABS: 0 10*3/uL (ref 0.0–0.1)
Basophils Relative: 1 %
Eosinophils Absolute: 0.1 10*3/uL (ref 0.0–0.7)
Eosinophils Relative: 1 %
HEMATOCRIT: 40.6 % (ref 39.0–52.0)
HEMOGLOBIN: 14.5 g/dL (ref 13.0–17.0)
LYMPHS PCT: 22 %
Lymphs Abs: 1.4 10*3/uL (ref 0.7–4.0)
MCH: 35.5 pg — ABNORMAL HIGH (ref 26.0–34.0)
MCHC: 35.7 g/dL (ref 30.0–36.0)
MCV: 99.5 fL (ref 78.0–100.0)
Monocytes Absolute: 0.5 10*3/uL (ref 0.1–1.0)
Monocytes Relative: 8 %
NEUTROS ABS: 4.4 10*3/uL (ref 1.7–7.7)
NEUTROS PCT: 68 %
Platelets: 139 10*3/uL — ABNORMAL LOW (ref 150–400)
RBC: 4.08 MIL/uL — AB (ref 4.22–5.81)
RDW: 12.3 % (ref 11.5–15.5)
WBC: 6.4 10*3/uL (ref 4.0–10.5)

## 2016-10-20 LAB — TSH: TSH: 1.659 u[IU]/mL (ref 0.350–4.500)

## 2016-10-20 LAB — T4, FREE: Free T4: 0.82 ng/dL (ref 0.61–1.12)

## 2016-10-20 MED ORDER — MAGNESIUM SULFATE 2 GM/50ML IV SOLN
2.0000 g | Freq: Once | INTRAVENOUS | Status: AC
  Administered 2016-10-20: 2 g via INTRAVENOUS
  Filled 2016-10-20: qty 50

## 2016-10-20 NOTE — ED Provider Notes (Signed)
[facility] Provider Note   CSN: [id number] Arrival date & time: 10/20/16  1236     History   Chief Complaint Chief Complaint  Patient presents with  . Hypertension  . Diarrhea    HPI Jonathan Harrington is a 60 y.o. male.  HPI  60 year old male with a history of hypertension, anxiety, depression presents with concern for fatigue, diarrhea for one month, and depression. Patient feels his diarrhea secondary to the increase in Zoloft that he had approximately 1 month ago, and began decreasing the dose which resulted in increasing depression, however continuing diarrhea. He called his primary care physician for an appointment today, however was told to come to the emergency department for hypertension.   Reports had depression after his wife died in April, which had improved after they Zoloft, and had his Zoloft increased a proximally 4 weeks ago, after which they mild diarrhea he had since April increased in severity, and he is having approximately 4-5 episodes per day. Reports decreased appetite, fatigue, lightheadedness when he goes from sitting to standing. Reports he has no energy to go to work. He also reports depression and suicidal ideation that comes and goes. Denies having a plan. Denies any history of suicide attempts in the past and reports "I don't think I could". Reports that he decreased the Zoloft 2-3 weeks ago, and diarrhea has continued however his depressive thoughts have increased. 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  Denies headache, chest pain, shortness of breath, numbness, weakness, black or bloody stools or recent antibiotics. Reports he does not want to go back to feeling the way he did after his wife died.    Past Medical History:  Diagnosis Date  . Anxiety   . Diverticulitis 2011   resulted in partial colectomy  . Essential hypertension 06/06/2016  . Full dentures   . Gout   .  Hematuria   . Wears glasses     Patient Active Problem List   Diagnosis Date Noted  . Essential hypertension 06/06/2016  . Smoker 05/12/2016  . Left inguinal hernia 05/23/2014  . Anxiety state, unspecified 09/17/2013  . Gout 01/17/2013  . NONSPEC ABN FINDNG RAD & OTH EXAM ABDOMINAL AREA 09/16/2010  . DIVERTICULOSIS-COLON 08/13/2010  . DIVERTICULITIS, COLON 08/13/2010  . ABDOMINAL PAIN-LLQ 08/13/2010  . Nonspecific (abnormal) findings on radiological and other examination of body structure 08/13/2010  . PERSONAL HX COLONIC POLYPS 08/13/2010  . NONSPCIFC ABN FINDING RAD & OTH EXAM LUNG FIELD 08/13/2010    Past Surgical History:  Procedure Laterality Date  . COLON SURGERY  2011   partial colectomy  . COLONOSCOPY    . FINGER ARTHROPLASTY  1990   rt index fx  . HERNIA REPAIR    . INGUINAL HERNIA REPAIR Left 05/27/2014   Procedure: LEFT INGUINAL HERNIA REPAIR WITH MESH;  Surgeon: Wilmon ArmsMatthew K. Corliss Skainssuei, MD;  Location: [facility];  Service: General;  Laterality: Left;  . INSERTION OF MESH Left 05/27/2014   Procedure: INSERTION OF MESH;  Surgeon: Wilmon ArmsMatthew K. Corliss Skainssuei, MD;  Location: [facility];  Service: General;  Laterality: Left;       Home Medications    Prior to Admission medications   Medication Sig Start Date End Date Taking? Authorizing Provider  allopurinol (ZYLOPRIM) 300 MG tablet TAKE 1 TABLET (300 MG TOTAL) BY MOUTH DAILY. 06/10/16   Pearline CablesJessica C [name], MD  ALPRAZolam Prudy Feeler(XANAX) 1 MG tablet Take 1-2 tablets (  1-2 mg total) by mouth 2 (two) times daily. 09/08/16   Gwenlyn Found [name], MD  CHANTIX STARTING MONTH PAK 0.5 MG X 11 & 1 MG X 42 tablet TAKE 1 TAB ONCE DAILY FOR 3 DAYS, THEN 1 TAB TWICE DAILY FOR 4 DAYS , THEN 2 TABS TWICE DAILY 09/14/16   Gwenlyn Found [name], MD  colchicine 0.6 MG tablet Take 2 pills, then take 1 an hour later.  May take 1 or 2 a day as needed for gout prevention 02/19/14   Pearline Cables, MD  HYDROcodone-acetaminophen (NORCO) 10-325  MG tablet Take 1 tablet by mouth 2 (two) times daily. 10/07/16   Gwenlyn Found [name], MD  lisinopril (PRINIVIL,ZESTRIL) 40 MG tablet Take 1 tablet (40 mg total) by mouth daily. 07/27/16   Pearline Cables, MD  Multiple Vitamins-Minerals (MULTIVITAMIN WITH MINERALS) tablet Take 1 tablet by mouth daily. Reported on 03/21/2016    Historical Provider, MD  niacin (NIASPAN) 500 MG CR tablet TAKE 3 TABLETS (1,500 MG TOTAL) BY MOUTH AT BEDTIME. 07/27/16   Gwenlyn Found [name], MD  sertraline (ZOLOFT) 100 MG tablet Take 1 tablet (100 mg total) by mouth daily. 06/06/16   Gwenlyn Found [name], MD  triamcinolone cream (KENALOG) 0.1 % Apply 1 application topically 2 (two) times daily. 10/02/14   Gwenlyn Found [name], MD  valACYclovir (VALTREX) 1000 MG tablet Take 1 tablet (1,000 mg total) by mouth 2 (two) times daily. 02/01/16   Peyton Najjar, MD  varenicline (CHANTIX CONTINUING MONTH PAK) 1 MG tablet Take 1 tablet (1 mg total) by mouth 2 (two) times daily. 07/27/16   Pearline Cables, MD    Family History Family History  Problem Relation Age of Onset  . Alzheimer's disease Mother   . Cancer Sister     Social History Social History  Substance Use Topics  . Smoking status: Current Every Day Smoker    Packs/day: 1.50  . Smokeless tobacco: Never Used  . Alcohol use 12.6 oz/week    21 Cans of beer per week     Comment: about 3 beers daily- history of heavy liquor abuse     Allergies   Ibuprofen   Review of Systems Review of Systems  Constitutional: Negative for fever.  HENT: Negative for sore throat.   Eyes: Negative for visual disturbance.  Respiratory: Negative for shortness of breath.   Cardiovascular: Negative for chest pain.  Gastrointestinal: Positive for diarrhea. Negative for abdominal pain, blood in stool, constipation, nausea and vomiting.  Genitourinary: Negative for difficulty urinating.  Musculoskeletal: Negative for back pain and neck stiffness.  Skin: Negative for rash.  Neurological:  Positive for light-headedness (when going from sitting to standing). Negative for syncope and headaches.  Psychiatric/Behavioral: Positive for sleep disturbance and suicidal ideas. Negative for hallucinations and self-injury.     Physical Exam Updated Vital Signs BP (!) 184/111   Pulse 75   Temp 98 F (36.7 C) (Oral)   Resp 20   Ht 6' (1.829 m)   Wt 190 lb (86.2 kg)   SpO2 97%   BMI 25.77 kg/m   Physical Exam  Constitutional: He is oriented to person, place, and time. He appears well-developed and well-nourished. No distress.  HENT:  Head: Normocephalic and atraumatic.  Eyes: Conjunctivae and EOM are normal.  Neck: Normal range of motion.  Cardiovascular: Normal rate, regular rhythm, normal heart sounds and intact distal pulses.  Exam reveals no gallop and no friction rub.   No murmur heard. Pulmonary/Chest: Effort normal  and breath sounds normal. No respiratory distress. He has no wheezes. He has no rales.  Abdominal: Soft. He exhibits no distension. There is no tenderness. There is no guarding.  Musculoskeletal: He exhibits no edema.  Neurological: He is alert and oriented to person, place, and time.  Skin: Skin is warm and dry. He is not diaphoretic.  Psychiatric: He expresses suicidal (reports suicidal ideation comes and goes) ideation. He expresses no homicidal ideation. He expresses no suicidal plans and no homicidal plans.  Nursing note and vitals reviewed.    ED Treatments / Results  Labs (all labs ordered are listed, but only abnormal results are displayed) Labs Reviewed  MAGNESIUM - Abnormal; Notable for the following:       Result Value   Magnesium 1.6 (*)    All other components within normal limits  CBC WITH DIFFERENTIAL/PLATELET - Abnormal; Notable for the following:    RBC 4.08 (*)    MCH 35.5 (*)    Platelets 139 (*)    All other components within normal limits  COMPREHENSIVE METABOLIC PANEL  CBC WITH DIFFERENTIAL/PLATELET  TSH  T4, FREE    EKG   EKG Interpretation  Date/Time:  Thursday October 20 2016 13:52:33 EST Ventricular Rate:  70 PR Interval:    QRS Duration: 102 QT Interval:  405 QTC Calculation: 437 R Axis:   58 Text Interpretation:  Sinus rhythm No significant change since last tracing Confirmed by [facility] MD, Denny Peon ([id number]) on 10/20/2016 2:52:57 PM       Radiology No results found.  Procedures Procedures (including critical care time)  Medications Ordered in ED Medications  magnesium sulfate IVPB 2 g 50 mL (0 g Intravenous Stopped 10/20/16 1601)     Initial Impression / Assessment and Plan / ED Course  I have reviewed the triage vital signs and the nursing notes.  Pertinent labs & imaging results that were available during my care of the patient were reviewed by me and considered in my medical decision making (see chart for details).  Clinical Course     60 year old male with a history of hypertension, anxiety, depression presents with concern for fatigue, diarrhea for one month, and depression. Patient feels his diarrhea secondary to the increase in Zoloft that he had approximately 1 month ago, and began decreasing the dose which resulted in increasing depression, however continuing diarrhea. He called his primary care physician for an appointment today, however was told to come to the emergency department for hypertension. Patient does not have any symptoms of hypertensive emergency, blood pressures are 160/100.   His major concern is fatigue. Laboratory work was done which showed no significant anemia, no hypokalemia, normal renal function. Patient does have hypomagnesemia which may contribute to some of his symptoms, as well as his depressive symptoms. He was given IV magnesium for replacement.  Ordered a TSH and free T4 for patient to have follow-up with his doctor.   He is on several medications which could contribute to his diarrhea, and recommend follow-up with his PCP regarding his medication regimen  in regards to his chronic diarrhea x1 mo as well as his depression. Recommend outpt evaluation of diarrhea with possible stool samples. No hx to suggest c diff. Provided number for [facility] for depression. Patient reports fleeting suicidal ideation, and depression, however denies any suicidal plan, and reports that he is safe to go home. Patient does contract for safety, and reports that he will notify crisis hot line or the emergency department if he becomes a  threat to himself.  I offered him behavioral health evaluation in the emergency department, however he declines this.  Patient and I feel he is safe for outpatient follow up.    Final Clinical Impressions(s) / ED Diagnoses   Final diagnoses:  Diarrhea, unspecified type  Fatigue, unspecified type  Moderate episode of recurrent major depressive disorder (HCC)  Hypomagnesemia    New Prescriptions Discharge Medication List as of 10/20/2016  3:25 PM       Alvira MondayErin Ahlaya Ende, MD 10/20/16 1614

## 2016-10-20 NOTE — ED Notes (Signed)
Dr. Staci AcostaSclossman to bedside at 1220. Discussed sx with pt who states he has only had thoughts of suicide, has not formulated plan, and has never attempted to hurt himself and does not think he could. [facility]

## 2016-10-20 NOTE — Telephone Encounter (Signed)
Per chart, patient went to the ER as advised.

## 2016-10-20 NOTE — ED Notes (Signed)
Pt status reviewed with Dr. Dalene SeltzerSchlossman, states to place pt in secure room but no need to initiate SI precautions until pt is seen by her. Pt placed in room 12.

## 2016-10-20 NOTE — Telephone Encounter (Signed)
Caller name: Relationship to patient: Self Can be reached: [phone]  Pharmacy:  Reason for call: Patient request that provider calls him back because he is experiencing severe depression, diarrhea and dizziness. Transferred to [facility] to speak directly with a nurse because of his dizziness. Spoke to [place].

## 2016-10-20 NOTE — Telephone Encounter (Signed)
Patient Name: Jonathan Harrington DOB: 10/18/1956 Initial Comment Caller states that he is dizziness and he is dealing with depression. He is also have diarrhea and ache all over. He has BP issue, current BP is 188/111. Nurse Assessment Nurse: Charna Elizabethrumbull, RN, Cathy Date/Time (Eastern Time): 10/20/2016 11:00:31 AM Confirm and document reason for call. If symptomatic, describe symptoms. You must click the next button to save text entered. ---[name] states his blood pressure was 188/111 this morning and he developed dizziness several months ago that is mild today. No injury in the past 3 days. No known fever. Alert and responsive. Has the patient traveled out of the country within the last 30 days? ---No Does the patient have any new or worsening symptoms? ---Yes Will a triage be completed? ---Yes Related visit to physician within the last 2 weeks? ---Yes Does the PT have any chronic conditions? (i.e. diabetes, asthma, etc.) ---Yes List chronic conditions. ---Chronic back pain, High Blood Pressure, Depression Is this a behavioral health or substance abuse call? ---No Guidelines Guideline Title Affirmed Question Affirmed Notes High Blood Pressure [1] BP # 160 / 100 AND [2] cardiac or neurologic symptoms (e.g., chest pain, difficulty breathing, unsteady gait, blurred vision) Final Disposition User Go to ED Now Charna Elizabethrumbull, RN, [facility] Disagree/Comply: Comply

## 2016-10-20 NOTE — ED Triage Notes (Signed)
Pt in c/o chronic diarrhea due to a med he takes for depression. States PCP cut the dose and now he is feeling more depressed and still has the diarrhea. States he called for appt upstairs but they told him to come here instead b/c BP was high, which is also chronic. Pt alert, interactive, ambulatory in NAD.

## 2016-10-25 NOTE — Telephone Encounter (Signed)
Pt was seen in the ER on 10/20/16 for diarrhea. Pt states that when he first started Zoloft he occasionally had diarrhea but since his dose was increased from 50 MG to 100 MG diarrhea has been much more frequent. Due to the diarrhea pt is not taking Zoloft, states that he stopped about 3 days ago. Since stopping the Zoloft diarrhea has improved but pt has noticed that he is now feeling depressed again. Pt states, " I am starting to feel the way I did before".  Pt would like to discuss another alternative to Zoloft for his depression. Please advise.

## 2016-10-26 MED ORDER — FLUOXETINE HCL 20 MG PO TABS: 20.0000 mg | ORAL_TABLET | Freq: Every day | ORAL | 3 refills | Status: DC

## 2016-10-26 NOTE — Telephone Encounter (Signed)
Called him back to discuss.  He stopped the zoloft 4-5 dys ago and diarrhea has resolved. He started on zoloft in approx May, and did well until more recently when diarrhea developed.  However he does need medication for depression.  Will try 20 mg of prozac for him, asked him to please schedule to see me in 1-2 months for a check up; let me know sooner if not doing ok.  He agrees. Also chantix does not seem to be helping him to quit smoking- he has used it for 2 months now. Advised to stop taking it if not helping him quit

## 2016-10-31 ENCOUNTER — Other Ambulatory Visit: Payer: Self-pay | Admitting: Family Medicine

## 2016-10-31 DIAGNOSIS — G8929 Other chronic pain: Secondary | ICD-10-CM

## 2016-10-31 DIAGNOSIS — M545 Low back pain: Principal | ICD-10-CM

## 2016-10-31 NOTE — Telephone Encounter (Signed)
Patient called stating that he spoke with Dr. Patsy Lageropland and she stated that she was going to put him on Seroquel instead of the Zoloft. He would like a follow up on this issue.   Patient would also like a refill of HYDROcodone-acetaminophen (NORCO) 10-325 MG tablet He would like this to be a longer prescription or have a few refills. Please advise.    Patient phone: [phone]

## 2016-10-31 NOTE — Telephone Encounter (Signed)
Sela Huaalled Jonathan Harrington- we changed him to prozac, not seroquel.  I sent in this rx for him He continues to have a lot of back pain, states that he plans to start epidural steroid injections.  I gave him #60 hydrocodone on 10/27 which was to be a 3o day supply.  He states that he is sometimes using 3 a day and has run out, he would like for me to refill these.  Explained that I will refill them on 11/22 when I am back in the office but he must use them as prescribed and not run out early.  He states understanding.  Checked NCCSR- no unexpected entries

## 2016-10-31 NOTE — Telephone Encounter (Signed)
Patient checking on the status of message below °

## 2016-11-02 ENCOUNTER — Telehealth: Payer: Self-pay | Admitting: Family Medicine

## 2016-11-02 MED ORDER — HYDROCODONE-ACETAMINOPHEN 10-325 MG PO TABS: 1.0000 | ORAL_TABLET | Freq: Three times a day (TID) | ORAL | 0 refills | Status: DC | PRN

## 2016-11-02 NOTE — Telephone Encounter (Signed)
Relation to WG:NFAOpt:self Call back [phone]   Reason for call:  [facility] transferred to [facility] experiencing chest pain and high blood pressure

## 2016-11-02 NOTE — Telephone Encounter (Signed)
Patient Name: Jonathan Harrington DOB: 02/15/1956 Initial Comment Caller states that he has been having abd pain and chest pain Nurse Assessment Nurse: Waynetta SandyMoon, RN, Melissa Date/Time (Eastern Time): 11/02/2016 11:18:47 AM Confirm and document reason for call. If symptomatic, describe symptoms. You must click the next button to save text entered. ---Caller states that he had abdominal and chest pain for the last 3 days. He is not currently having pain right now. Does the patient have any new or worsening symptoms? ---Yes Will a triage be completed? ---Yes Related visit to physician within the last 2 weeks? ---N/A Does the PT have any chronic conditions? (i.e. diabetes, asthma, etc.) ---Yes List chronic conditions. ---Hypertension, Is this a behavioral health or substance abuse call? ---No Guidelines Guideline Title Affirmed Question Affirmed Notes Chest Pain [1] Chest pain lasting <= 5 minutes AND [2] NO chest pain or cardiac symptoms now (Exceptions: pains lasting a few seconds) Final Disposition User See Physician within 24 Hours Moon, Charity fundraiserN, [employer] MillsMelissa Referrals GO TO FACILITY REFUSED Disagree/Comply: Disagree Disagree/Comply Reason: Wait and see

## 2016-11-02 NOTE — Telephone Encounter (Signed)
Called pt, states he feels that his symptoms are anxiety related. He has been taking Xanax 1 tablet BID, and increased to 2 tablets (per sig) today, which relieved his discomfort. He states he is very stressed lately. He states he lost his wife in March, and recently got involved with women over the internet who he has been sending money to and recently discovered it to be a scam. He has a lost a significant amount of money through this and states he's down to only having his truck. States his BP was elevated last night, but he has not checked it today. He was offered an acute same day appt w/ Dr. Patsy Lageropland at [facility], but states he has "too much stuff to get done today." He plans to keep appt as scheduled on Monday and agrees to follow-up sooner at walk-in urgent care or Friday/Saturday clinic if any worsening of symptoms prior to appt.

## 2016-11-02 NOTE — Telephone Encounter (Signed)
Acute appt scheduled 11/07/16. Please advise if okay to wait until Monday or if you would like him to be seen somewhere sooner.

## 2016-11-02 NOTE — Telephone Encounter (Signed)
Please give him a call- offer him a visit with urgent care.  We also might have something available in the Friday or Saturday clinic this week? Thanks- JC

## 2016-11-07 ENCOUNTER — Encounter (HOSPITAL_BASED_OUTPATIENT_CLINIC_OR_DEPARTMENT_OTHER): Payer: Self-pay

## 2016-11-07 ENCOUNTER — Ambulatory Visit (INDEPENDENT_AMBULATORY_CARE_PROVIDER_SITE_OTHER): Payer: BLUE CROSS/BLUE SHIELD | Admitting: Family Medicine

## 2016-11-07 ENCOUNTER — Emergency Department (HOSPITAL_BASED_OUTPATIENT_CLINIC_OR_DEPARTMENT_OTHER)
Admission: EM | Admit: 2016-11-07 | Discharge: 2016-11-07 | Disposition: A | Discharge status: Home / Self Care | Payer: BLUE CROSS/BLUE SHIELD | Attending: Emergency Medicine | Admitting: Emergency Medicine

## 2016-11-07 ENCOUNTER — Encounter: Payer: Self-pay | Admitting: Family Medicine

## 2016-11-07 VITALS — BP 155/77 | HR 90 | Temp 97.9°F | Wt 188.2 lb

## 2016-11-07 DIAGNOSIS — I10 Essential (primary) hypertension: Secondary | ICD-10-CM | POA: Diagnosis not present

## 2016-11-07 DIAGNOSIS — Z79899 Other long term (current) drug therapy: Secondary | ICD-10-CM | POA: Insufficient documentation

## 2016-11-07 DIAGNOSIS — F1320 Sedative, hypnotic or anxiolytic dependence, uncomplicated: Secondary | ICD-10-CM | POA: Insufficient documentation

## 2016-11-07 DIAGNOSIS — F322 Major depressive disorder, single episode, severe without psychotic features: Secondary | ICD-10-CM | POA: Diagnosis not present

## 2016-11-07 DIAGNOSIS — Z046 Encounter for general psychiatric examination, requested by authority: Secondary | ICD-10-CM | POA: Diagnosis present

## 2016-11-07 DIAGNOSIS — F1390 Sedative, hypnotic, or anxiolytic use, unspecified, uncomplicated: Secondary | ICD-10-CM

## 2016-11-07 DIAGNOSIS — F1310 Sedative, hypnotic or anxiolytic abuse, uncomplicated: Secondary | ICD-10-CM

## 2016-11-07 DIAGNOSIS — F17200 Nicotine dependence, unspecified, uncomplicated: Secondary | ICD-10-CM | POA: Insufficient documentation

## 2016-11-07 DIAGNOSIS — R079 Chest pain, unspecified: Secondary | ICD-10-CM | POA: Diagnosis not present

## 2016-11-07 DIAGNOSIS — R410 Disorientation, unspecified: Secondary | ICD-10-CM | POA: Diagnosis not present

## 2016-11-07 DIAGNOSIS — F1010 Alcohol abuse, uncomplicated: Secondary | ICD-10-CM | POA: Insufficient documentation

## 2016-11-07 LAB — COMPREHENSIVE METABOLIC PANEL
ALBUMIN: 4.2 g/dL (ref 3.5–5.0)
ALT: 20 U/L (ref 17–63)
ANION GAP: 8 (ref 5–15)
AST: 26 U/L (ref 15–41)
Alkaline Phosphatase: 45 U/L (ref 38–126)
BILIRUBIN TOTAL: 0.8 mg/dL (ref 0.3–1.2)
BUN: 17 mg/dL (ref 6–20)
CO2: 27 mmol/L (ref 22–32)
Calcium: 9.1 mg/dL (ref 8.9–10.3)
Chloride: 107 mmol/L (ref 101–111)
Creatinine, Ser: 0.81 mg/dL (ref 0.61–1.24)
GFR calc non Af Amer: >60 mL/min (ref >60)
GLUCOSE: 95 mg/dL (ref 65–99)
POTASSIUM: 4.7 mmol/L (ref 3.5–5.1)
SODIUM: 142 mmol/L (ref 135–145)
TOTAL PROTEIN: 7.7 g/dL (ref 6.5–8.1)

## 2016-11-07 LAB — URINALYSIS, ROUTINE W REFLEX MICROSCOPIC
Bilirubin Urine: NEGATIVE
GLUCOSE, UA: NEGATIVE mg/dL
HGB URINE DIPSTICK: NEGATIVE
Ketones, ur: NEGATIVE mg/dL
Nitrite: NEGATIVE
PH: 6.5 (ref 5.0–8.0)
PROTEIN: 30 mg/dL — AB
SPECIFIC GRAVITY, URINE: 1.02 (ref 1.005–1.030)

## 2016-11-07 LAB — URINE MICROSCOPIC-ADD ON

## 2016-11-07 LAB — RAPID URINE DRUG SCREEN, HOSP PERFORMED
Amphetamines: NOT DETECTED
BARBITURATES: NOT DETECTED
BENZODIAZEPINES: POSITIVE — AB
COCAINE: NOT DETECTED
OPIATES: POSITIVE — AB
Tetrahydrocannabinol: NOT DETECTED

## 2016-11-07 LAB — CBC WITH DIFFERENTIAL/PLATELET
BASOS PCT: 0 %
Basophils Absolute: 0 10*3/uL (ref 0.0–0.1)
EOS ABS: 0.1 10*3/uL (ref 0.0–0.7)
Eosinophils Relative: 2 %
HCT: 42.7 % (ref 39.0–52.0)
Hemoglobin: 14.9 g/dL (ref 13.0–17.0)
Lymphocytes Relative: 31 %
Lymphs Abs: 1.6 10*3/uL (ref 0.7–4.0)
MCH: 35.5 pg — AB (ref 26.0–34.0)
MCHC: 34.9 g/dL (ref 30.0–36.0)
MCV: 101.7 fL — ABNORMAL HIGH (ref 78.0–100.0)
MONO ABS: 0.4 10*3/uL (ref 0.1–1.0)
MONOS PCT: 7 %
Neutro Abs: 3.2 10*3/uL (ref 1.7–7.7)
Neutrophils Relative %: 60 %
Platelets: 165 10*3/uL (ref 150–400)
RBC: 4.2 MIL/uL — ABNORMAL LOW (ref 4.22–5.81)
RDW: 12.7 % (ref 11.5–15.5)
WBC: 5.3 10*3/uL (ref 4.0–10.5)

## 2016-11-07 LAB — LIPASE, BLOOD: Lipase: 22 U/L (ref 11–51)

## 2016-11-07 LAB — ETHANOL: Alcohol, Ethyl (B): 41 mg/dL — ABNORMAL HIGH (ref <5)

## 2016-11-07 NOTE — BH Assessment (Addendum)
Tele Assessment Note   Jonathan Harrington is an 60 y.o. male who presents voluntarily sent by his PCP reporting symptoms of depression since his wife died in 2023-03-19. Pt reports medication change--stopped taking his Zoloft 2 weeks ago since it was giving him diarrhea, and his PCP prescribed him another medication last night that he can't recall the name of. His PCP wanted him evaluated since he "wasn't acting like himself". Pt states that he has been taking 3-4 xanax per day and increased his pain pills recently. Dr. 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 Pt denies SI, HI, AVH, and states he has never attempted suicide, "If I was going to do that, I would have done it soon after she died".  Pt has been having trouble sleeping and states that he is upset that he has given away $70-80, 000 to two different women over the Internet since last spring, and he realizes he has been taken advantage of.  PT denies homicidal ideation/ history of violence. Pt denies auditory or visual hallucinations or other psychotic symptoms. Pt states current stressors include his grief and losing money, having to move due to not being able to afford his new place.  Pt lives alone, and supports include his son who lives near [place] . Pt denies history of abuse and trauma. Pt reports there no family history of depression. 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 Pt has somewhat poor insight and judgment. Pt's memory is fair. Pt denies legal history.  ? Pt denies OP history other than his PCP, no previous IP history. ? MSE: Pt is casually dressed, alert, oriented x4 with normal speech and normal motor behavior. Eye contact is good. Pt's mood is depressed and affect is depressed and blunted. Affect is congruent with mood. Thought process is coherent and relevant. There is no indication Pt is currently responding to internal stimuli or experiencing delusional thought content. Pt  was cooperative throughout assessment. Pt is currently able to contract for safety outside the hospital and wants to leave. Fransisca Kaufmann, NP recommends OP referrals and states that pt does not meet IVC criteria for Ip treatment at this time.   Writer attempted to make intake appt with pt for CD IOP at [facility], but pt left ED before appointment could be made. Faxed information in case pt comes back.    Diagnosis: MDD, severe without psychotic features  Past Medical History:  Past Medical History:  Diagnosis Date  . Anxiety   . Diverticulitis 2011   resulted in partial colectomy  . Essential hypertension 06/06/2016  . Full dentures   . Gout   . Hematuria   . Wears glasses     Past Surgical History:  Procedure Laterality Date  . COLON SURGERY  2011   partial colectomy  . COLONOSCOPY    . FINGER ARTHROPLASTY  1990   rt index fx  . HERNIA REPAIR    . INGUINAL HERNIA REPAIR Left 05/27/2014   Procedure: LEFT INGUINAL HERNIA REPAIR WITH MESH;  Surgeon: Wilmon Arms. Corliss Skains, MD;  Location: [facility];  Service: General;  Laterality: Left;  . INSERTION OF MESH Left 05/27/2014   Procedure: INSERTION OF MESH;  Surgeon: Wilmon Arms. Corliss Skains, MD;  Location: [facility];  Service: General;  Laterality: Left;    Family History:  Family History  Problem Relation Age of Onset  . Alzheimer's disease Mother   . Cancer  Sister     Social History:  reports that he has been smoking.  He has been smoking about 1.50 packs per day. He has never used smokeless tobacco. He reports that he drinks about 12.6 oz of alcohol per week . He reports that he does not use drugs.  Additional Social History:  Alcohol / Drug Use Pain Medications: denies Prescriptions: taking 3-4 xanax and is prescribed 1-2/day Over the Counter: denies History of alcohol / drug use?: No history of alcohol / drug abuse Longest period of sobriety (when/how long):  (NA)  CIWA: CIWA-Ar BP: 169/95 Pulse Rate:  83 COWS:    PATIENT STRENGTHS: (choose at least two) Average or above average intelligence Capable of independent living Communication skills Supportive family/friends Work skills  Allergies:  Allergies  Allergen Reactions  . Ibuprofen Other (See Comments)    Unknown reaction    Home Medications:  (Not in a hospital admission)  OB/GYN Status:  No LMP for male patient.  General Assessment Data Location of Assessment:  ([facility]) TTS Assessment: In system Is this a Tele or Face-to-Face Assessment?: Tele Assessment Is this an Initial Assessment or a Re-assessment for this encounter?: Initial Assessment Marital status: Widowed Living Arrangements: Alone Can pt return to current living arrangement?: Yes Admission Status: Voluntary Is patient capable of signing voluntary admission?: Yes Referral Source: Self/Family/Friend Insurance type: BCBS     Crisis Care Plan Living Arrangements: Alone Name of Psychiatrist: none Name of Therapist: none  Education Status Is patient currently in school?: No  Risk to self with the past 6 months Suicidal Ideation: No-Not Currently/Within Last 6 Months Has patient been a risk to self within the past 6 months prior to admission? : No Suicidal Intent: No Has patient had any suicidal intent within the past 6 months prior to admission? : No Is patient at risk for suicide?: Yes Suicidal Plan?: No Has patient had any suicidal plan within the past 6 months prior to admission? : No Access to Means: No Previous Attempts/Gestures: No Intentional Self Injurious Behavior: None Family Suicide History: No Recent stressful life event(s): Other (Comment) (wide died, have to move, taken advantage of by women on inte) Persecutory voices/beliefs?: No Depression: Yes Depression Symptoms: Insomnia, Fatigue, Guilt, Loss of interest in usual pleasures Substance abuse history and/or treatment for substance abuse?: No Suicide prevention information given to  non-admitted patients: Not applicable  Risk to Others within the past 6 months Homicidal Ideation: No Does patient have any lifetime risk of violence toward others beyond the six months prior to admission? : No Thoughts of Harm to Others: No Current Homicidal Intent: No Current Homicidal Plan: No Access to Homicidal Means: No History of harm to others?: No Assessment of Violence: None Noted Does patient have access to weapons?: Yes (Comment) (gun) Criminal Charges Pending?: No Does patient have a court date: No Is patient on probation?: No  Psychosis Hallucinations: None noted Delusions: None noted  Mental Status Report Appearance/Hygiene: Unremarkable Eye Contact: Good Motor Activity: Unremarkable Speech: Logical/coherent Level of Consciousness: Alert Mood: Depressed, Sad Affect: Appropriate to circumstance Anxiety Level: Moderate Thought Processes: Coherent, Relevant Judgement: Partial Orientation: Person, Place, Time, Situation, Appropriate for developmental age Obsessive Compulsive Thoughts/Behaviors: None  Cognitive Functioning Concentration: Fair Memory: Recent Intact, Remote Intact IQ: Average Insight: Fair Impulse Control: Fair Appetite: Fair Weight Loss: 0 Weight Gain: 0 Sleep: Decreased Total Hours of Sleep: 5 Vegetative Symptoms: None  ADLScreening [facility] Assessment Services) Patient's cognitive ability adequate to safely complete daily activities?: Yes Patient  able to express need for assistance with ADLs?: Yes Independently performs ADLs?: Yes (appropriate for developmental age)  Prior Inpatient Therapy Prior Inpatient Therapy: No  Prior Outpatient Therapy Prior Outpatient Therapy: Yes (PCP) Does patient have an ACCT team?: No Does patient have Intensive In-House Services?  : No Does patient have [facility] services? : No Does patient have P4CC services?: No  ADL Screening (condition at time of admission) Patient's cognitive ability adequate to  safely complete daily activities?: Yes Is the patient deaf or have difficulty hearing?: No Does the patient have difficulty seeing, even when wearing glasses/contacts?: No Does the patient have difficulty concentrating, remembering, or making decisions?: No Patient able to express need for assistance with ADLs?: Yes Does the patient have difficulty dressing or bathing?: No Independently performs ADLs?: Yes (appropriate for developmental age) Does the patient have difficulty walking or climbing stairs?: No Weakness of Legs: None Weakness of Arms/Hands: None  Home Assistive Devices/Equipment Home Assistive Devices/Equipment: None    Abuse/Neglect Assessment (Assessment to be complete while patient is alone) Physical Abuse: Denies Verbal Abuse: Denies Sexual Abuse: Denies Exploitation of patient/patient's resources: Denies Self-Neglect: Denies Values / Beliefs Cultural Requests During Hospitalization: None Spiritual Requests During Hospitalization: None Consults Spiritual Care Consult Needed: No Social Work Consult Needed: No Merchant navy officerAdvance Directives (For Healthcare) Does Patient Have a Medical Advance Directive?: No    Additional Information 1:1 In Past 12 Months?: No CIRT Risk: No Elopement Risk: No Does patient have medical clearance?: Yes     Disposition:  Disposition Initial Assessment Completed for this Encounter: Yes Disposition of Patient:  (review by psychiatry)  Theo DillsHull,Zarin Hagmann Hines 11/07/2016 2:34 PM

## 2016-11-07 NOTE — Patient Instructions (Signed)
Jonathan Harrington, I am concerned about your chest pain and some of the recent decisions you have been making. There may be a physical cause of your symptoms.  We need to check you out further.  Please go to the ER downstairs to make sure that you are ok!

## 2016-11-07 NOTE — ED Triage Notes (Addendum)
C/o abd pain and chest pain since last Tuesday-pt states he was sent from PCP in the building "because I didn't seem right-think I might commit suicide"-pt states "i've thought about it but not seriously enough to do it"-pt presents to triage in w/c-A/O-NAD

## 2016-11-07 NOTE — ED Provider Notes (Signed)
[facility] Provider Note   CSN: [id number] Arrival date & time: 11/07/16  1136     History   Chief Complaint Chief Complaint  Patient presents with  . Abdominal Pain  . Medical Clearance    HPI Jonathan Harrington is a 60 y.o. male.  HPI Patient continues to start with depression.  He presents to the emergency department from his clinic where he is describing ongoing depressive symptoms.  He is overusing his Xanax because he states it makes him feel better.  He had been sober for a year and is back to drinking 6-8 shots of alcohol daily with an occasional wine cooler.  He admits that sometimes he does drink in the morning.  He has had occasional vague thoughts of suicide but does not have a plan.  He does have guns in the house but states that he would not ever kill himself.  He states he was wanting to kill himself he would've done this a long time ago.  He has not held any of his guns in his hand in the past year.  His wife did die suddenly 3 months ago and he was still struggling with the death of his wife.  He also reports that he is struggling financially and is going to have to find a new place to live after the new year as he is unable to afford his double wide.  He is contemplating moving to [place] where his son is but he currently works here and continues to work full-time.   Past Medical History:  Diagnosis Date  . Anxiety   . Diverticulitis 2011   resulted in partial colectomy  . Essential hypertension 06/06/2016  . Full dentures   . Gout   . Hematuria   . Wears glasses     Patient Active Problem List   Diagnosis Date Noted  . Essential hypertension 06/06/2016  . Smoker 05/12/2016  . Left inguinal hernia 05/23/2014  . Anxiety state, unspecified 09/17/2013  . Gout 01/17/2013  . NONSPEC ABN FINDNG RAD & OTH EXAM ABDOMINAL AREA 09/16/2010  . DIVERTICULOSIS-COLON 08/13/2010  . DIVERTICULITIS, COLON 08/13/2010  . ABDOMINAL PAIN-LLQ 08/13/2010  .  Nonspecific (abnormal) findings on radiological and other examination of body structure 08/13/2010  . PERSONAL HX COLONIC POLYPS 08/13/2010  . NONSPCIFC ABN FINDING RAD & OTH EXAM LUNG FIELD 08/13/2010    Past Surgical History:  Procedure Laterality Date  . COLON SURGERY  2011   partial colectomy  . COLONOSCOPY    . FINGER ARTHROPLASTY  1990   rt index fx  . HERNIA REPAIR    . INGUINAL HERNIA REPAIR Left 05/27/2014   Procedure: LEFT INGUINAL HERNIA REPAIR WITH MESH;  Surgeon: Wilmon ArmsMatthew K. Corliss Skainssuei, MD;  Location: [facility];  Service: General;  Laterality: Left;  . INSERTION OF MESH Left 05/27/2014   Procedure: INSERTION OF MESH;  Surgeon: Wilmon ArmsMatthew K. Corliss Skainssuei, MD;  Location: [facility];  Service: General;  Laterality: Left;       Home Medications    Prior to Admission medications   Medication Sig Start Date End Date Taking? Authorizing Provider  allopurinol (ZYLOPRIM) 300 MG tablet TAKE 1 TABLET (300 MG TOTAL) BY MOUTH DAILY. 06/10/16   [name] [name], MD  ALPRAZolam Prudy Feeler(XANAX) 1 MG tablet Take 1-2 tablets (1-2 mg total) by mouth 2 (two) times daily. 09/08/16   [name] [name], MD  colchicine 0.6 MG tablet Take 2 pills, then take 1 an hour later.  May take 1 or 2 a day as needed for gout prevention Patient not taking: Reported on 11/07/2016 02/19/14   Pearline Cables, MD  FLUoxetine (PROZAC) 20 MG tablet Take 1 tablet (20 mg total) by mouth daily. 10/26/16   Pearline Cables, MD  HYDROcodone-acetaminophen (NORCO) 10-325 MG tablet Take 1 tablet by mouth every 8 (eight) hours as needed. Ok to fill in 30 days 11/02/16   Gwenlyn Found [name], MD  lisinopril (PRINIVIL,ZESTRIL) 40 MG tablet Take 1 tablet (40 mg total) by mouth daily. 07/27/16   Pearline Cables, MD  Multiple Vitamins-Minerals (MULTIVITAMIN WITH MINERALS) tablet Take 1 tablet by mouth daily. Reported on 03/21/2016    Historical Provider, MD  niacin (NIASPAN) 500 MG CR tablet TAKE 3 TABLETS (1,500 MG  TOTAL) BY MOUTH AT BEDTIME. 07/27/16   Gwenlyn Found [name], MD  triamcinolone cream (KENALOG) 0.1 % Apply 1 application topically 2 (two) times daily. 10/02/14   Gwenlyn Found [name], MD  valACYclovir (VALTREX) 1000 MG tablet Take 1 tablet (1,000 mg total) by mouth 2 (two) times daily. 02/01/16   Peyton Najjar, MD    Family History Family History  Problem Relation Age of Onset  . Alzheimer's disease Mother   . Cancer Sister     Social History Social History  Substance Use Topics  . Smoking status: Current Every Day Smoker    Packs/day: 1.50  . Smokeless tobacco: Never Used  . Alcohol use 12.6 oz/week    21 Cans of beer per week     Comment: about 3 beers daily- history of heavy liquor abuse     Allergies   Ibuprofen   Review of Systems Review of Systems  All other systems reviewed and are negative.    Physical Exam Updated Vital Signs BP 169/95   Pulse 83   Temp 97.9 F (36.6 C) (Oral)   Resp 18   SpO2 98%   Physical Exam  Constitutional: He is oriented to person, place, and time. He appears well-developed and well-nourished.  HENT:  Head: Normocephalic and atraumatic.  Eyes: EOM are normal.  Neck: Normal range of motion.  Cardiovascular: Normal rate, regular rhythm, normal heart sounds and intact distal pulses.   Pulmonary/Chest: Effort normal and breath sounds normal. No respiratory distress.  Abdominal: Soft. He exhibits no distension. There is no tenderness.  Musculoskeletal: Normal range of motion.  Neurological: He is alert and oriented to person, place, and time.  Skin: Skin is warm and dry.  Psychiatric: Judgment normal. His mood appears not anxious. His affect is not angry and not inappropriate. His speech is not rapid and/or pressured and not slurred. He is not agitated, not aggressive and not actively hallucinating. Cognition and memory are normal. He exhibits a depressed mood. He expresses no suicidal plans and no homicidal plans.  Nursing note and vitals  reviewed.    ED Treatments / Results  Labs (all labs ordered are listed, but only abnormal results are displayed) Labs Reviewed  CBC WITH DIFFERENTIAL/PLATELET - Abnormal; Notable for the following:       Result Value   RBC 4.20 (*)    MCV 101.7 (*)    MCH 35.5 (*)    All other components within normal limits  ETHANOL - Abnormal; Notable for the following:    Alcohol, Ethyl (B) 41 (*)    All other components within normal limits  RAPID URINE DRUG SCREEN, HOSP PERFORMED - Abnormal; Notable for the following:    Opiates POSITIVE (*)  Benzodiazepines POSITIVE (*)    All other components within normal limits  URINALYSIS, ROUTINE W REFLEX MICROSCOPIC (NOT AT [facility]) - Abnormal; Notable for the following:    Protein, ur 30 (*)    Leukocytes, UA SMALL (*)    All other components within normal limits  URINE MICROSCOPIC-ADD ON - Abnormal; Notable for the following:    Squamous Epithelial / LPF 0-5 (*)    Bacteria, UA FEW (*)    Casts HYALINE CASTS (*)    All other components within normal limits  COMPREHENSIVE METABOLIC PANEL  LIPASE, BLOOD    EKG  EKG Interpretation None       Radiology No results found.  Procedures Procedures (including critical care time)  Medications Ordered in ED Medications - No data to display   Initial Impression / Assessment and Plan / ED Course  I have reviewed the triage vital signs and the nursing notes.  Pertinent labs & imaging results that were available during my care of the patient were reviewed by me and considered in my medical decision making (see chart for details).  Clinical Course     Suspect the patient is drinking more alcohol than is letting on.  He also appears to be using his Xanax more often as prescribed.  Concerned about these behaviors and seems as though he needs to give of his alcohol and his benzodiazepine use in check.  He defervesced is depressed and will need mental health follow-up.  I discussed the case with  behavioral health, TTS who did a consultation.  Please see the consultation note for complete details.  They are in agreement that the patient does not meet acute inpatient requirements for hospitalization.  They agreed the patient does not need to be IVCs at this time.  We will try to arrange aggressive outpatient treatment for the patient.  TTS is currently working on this.   Final Clinical Impressions(s) / ED Diagnoses   Final diagnoses:  None    New Prescriptions New Prescriptions   No medications on file     Azalia BilisKevin Glenwood Revoir, MD 11/07/16 1501

## 2016-11-07 NOTE — ED Notes (Signed)
Pt returned after smoking, but left again because discharge papers were not ready.

## 2016-11-07 NOTE — ED Notes (Signed)
ED Provider at bedside. 

## 2016-11-07 NOTE — ED Notes (Signed)
Witnessed pt walk back in room then walked back out to lobby

## 2016-11-07 NOTE — Progress Notes (Signed)
Pre visit review using our clinic tool,if applicable. No additional management support is needed unless otherwise documented below in the visit note.  

## 2016-11-07 NOTE — ED Notes (Signed)
Psych consult complete, awaiting report to EDP; pt wants to leave, but agrees to stay if he is allowed to go smoke; MD aware.

## 2016-11-07 NOTE — Progress Notes (Signed)
Jonathan Harrington at [facility] [address] [place], [place] [address] [phone]  Date:  11/07/2016   Name:  Jonathan Harrington   DOB:  [birth year]   MRN:  [id number]  PCP:  Jonathan Blinks, MD    Chief Complaint: Abdominal Pain (Chest pain,hx of sroke per patient)   History of Present Illness:  Jonathan Harrington is a 60 y.o. very pleasant male patient who presents with the following:  Last seen by myself in August of this year. We increased his lisinopril to 40 mg at that time as his BP was too high  BP Readings from Last 3 Encounters:  11/07/16 (!) 155/77  10/20/16 (!) 184/111  07/27/16 (!) 180/96   He was in the ER on 11/9 with concern of depression, diarrhea which he attributed to his zoloft.  He was evaluated and released from the ER    7 days ago he noted an abd pain that was "9 out of 10" and then that same evening he had several episodes of "6/10" chest pain.  He notes that he has continued to have the chest pains and the abdominal pains over the last several days, although they are not severe   The CP may last until he takes a xanax.  He cannot really quantify how long the pain might last as he is just using the xanax every time he has the pain.  The CP may occur 0-1 times a day. The diarrhea issue is now resolved since we stopped zoloft and changed to prozac 23m.  He started this medication about a week ago He states that he is taking xanax "about 5x a day now." This does seem to prevent his chest pain.  He last noted the CP yesterday- it lasted maybe 2-3 seconds  He has noted chills. No fever.  He notes that he will sometimes throw up while drinking his morning coffee approx twice a week   He cannot really tell me how long this has been the case  Asked if any difficulty breathing- he cannot answer this question for me.   FTreyshonlost his wife Jonathan Harrington earlier this year in a traumatic fashion and has been struggling since.  He  recently confided that he has lost a lot of money; he was giving away money to "women" who he met on the internet but he now realizes that this was a big mistake.  He admits that he may not be thinking clearly and that people are concerned about him. He does have guns at home, but "I would not shoot myself because then my son would not get any insurance money."     Patient Active Problem List   Diagnosis Date Noted  . Essential hypertension 06/06/2016  . Smoker 05/12/2016  . Left inguinal hernia 05/23/2014  . Anxiety state, unspecified 09/17/2013  . Gout 01/17/2013  . NONSPEC ABN FINDNG RAD & OTH EXAM ABDOMINAL AREA 09/16/2010  . DIVERTICULOSIS-COLON 08/13/2010  . DIVERTICULITIS, COLON 08/13/2010  . ABDOMINAL PAIN-LLQ 08/13/2010  . Nonspecific (abnormal) findings on radiological and other examination of body structure 08/13/2010  . PERSONAL HX COLONIC POLYPS 08/13/2010  . NONSPCIFC ABN FINDING RAD & OTH EXAM LUNG FIELD 08/13/2010    Past Medical History:  Diagnosis Date  . Anxiety   . Diverticulitis 2011   resulted in partial colectomy  . Essential hypertension 06/06/2016  . Full dentures   . Gout   . Hematuria   .  Wears glasses     Past Surgical History:  Procedure Laterality Date  . COLON SURGERY  2011   partial colectomy  . COLONOSCOPY    . FINGER ARTHROPLASTY  1990   rt index fx  . HERNIA REPAIR    . INGUINAL HERNIA REPAIR Left 05/27/2014   Procedure: LEFT INGUINAL HERNIA REPAIR WITH MESH;  Surgeon: Imogene Burn. Georgette Dover, MD;  Location: [facility];  Service: General;  Laterality: Left;  . INSERTION OF MESH Left 05/27/2014   Procedure: INSERTION OF MESH;  Surgeon: Imogene Burn. Georgette Dover, MD;  Location: [facility];  Service: General;  Laterality: Left;    Social History  Substance Use Topics  . Smoking status: Current Every Day Smoker    Packs/day: 1.50  . Smokeless tobacco: Never Used  . Alcohol use 12.6 oz/week    21 Cans of beer per week      Comment: about 3 beers daily- history of heavy liquor abuse    Family History  Problem Relation Age of Onset  . Alzheimer's disease Mother   . Cancer Sister     Allergies  Allergen Reactions  . Ibuprofen Other (See Comments)    Unknown reaction    Medication list has been reviewed and updated.  Current Outpatient Prescriptions on File Prior to Visit  Medication Sig Dispense Refill  . allopurinol (ZYLOPRIM) 300 MG tablet TAKE 1 TABLET (300 MG TOTAL) BY MOUTH DAILY. 90 tablet 3  . ALPRAZolam (XANAX) 1 MG tablet Take 1-2 tablets (1-2 mg total) by mouth 2 (two) times daily. 120 tablet 1  . FLUoxetine (PROZAC) 20 MG tablet Take 1 tablet (20 mg total) by mouth daily. 30 tablet 3  . HYDROcodone-acetaminophen (NORCO) 10-325 MG tablet Take 1 tablet by mouth every 8 (eight) hours as needed. Ok to fill in 30 days 70 tablet 0  . lisinopril (PRINIVIL,ZESTRIL) 40 MG tablet Take 1 tablet (40 mg total) by mouth daily. 30 tablet 6  . niacin (NIASPAN) 500 MG CR tablet TAKE 3 TABLETS (1,500 MG TOTAL) BY MOUTH AT BEDTIME. 270 tablet 2  . triamcinolone cream (KENALOG) 0.1 % Apply 1 application topically 2 (two) times daily. 90 g 2  . valACYclovir (VALTREX) 1000 MG tablet Take 1 tablet (1,000 mg total) by mouth 2 (two) times daily. 21 tablet 0  . colchicine 0.6 MG tablet Take 2 pills, then take 1 an hour later.  May take 1 or 2 a day as needed for gout prevention (Patient not taking: Reported on 11/07/2016) 30 tablet 0  . Multiple Vitamins-Minerals (MULTIVITAMIN WITH MINERALS) tablet Take 1 tablet by mouth daily. Reported on 03/21/2016     No current facility-administered medications on file prior to visit.     Review of Systems:  As per HPI- otherwise negative.   Physical Examination: Vitals:   11/07/16 1041  BP: (!) 155/77  Pulse: 90  Temp: 97.9 F (36.6 C)   Vitals:   11/07/16 1041  Weight: 188 lb 3.2 oz (85.4 kg)   Body mass index is 25.52 kg/m. Ideal Body Weight:    GEN: WDWN,  NAD, Non-toxic, A & O x 3, subdued, smells very strongly of tobacco  HEENT: Atraumatic, Normocephalic. Neck supple. No masses, No LAD. Ears and Nose: No external deformity. CV: RRR, No M/G/R. No JVD. No thrill. No extra heart sounds. PULM: CTA B, no wheezes, crackles, rhonchi. No retractions. No resp. distress. No accessory muscle use. ABD: S, NT, ND EXTR: No c/c/e NEURO Normal gait.  PSYCH: Normally interactive. Conversant. Not depressed or anxious appearing.  Calm demeanor.   EKG: NSR Assessment and Plan: Chest pain, unspecified type - Plan: EKG 12-Lead  Delirium  Here today with chest pain and mental status change- he is not acting like himself, taking more xanax than he should and giving away money to people he does not know online.  Convinced him to be seen in the ER today and one of my staff will walk with him to the ED.  Assuming all is ok physically he urgently needs mental health care and may need to be considered for inpt treatment   Signed Jonathan Blinks, MD

## 2016-11-07 NOTE — ED Notes (Signed)
Pt back in room 12 receiving d/c papers. Dr Patria Maneampos reviewing discharge instructions with pt

## 2016-11-11 NOTE — Telephone Encounter (Signed)
-----   Message from Pearline CablesJessica C Alif Petrak, MD sent at 11/07/2016  4:26 PM EST ----- Check on him

## 2016-11-11 NOTE — Telephone Encounter (Signed)
Called pt to check on him- he reports that he is doing well and feeling quite a bit better.  He has stopped drinking hard alcohol.  He received mental health care resources from the ER but has not followed up yet; however he plans to do so. He will let me know if he needs anything

## 2016-12-16 ENCOUNTER — Other Ambulatory Visit: Payer: Self-pay | Admitting: Family Medicine

## 2016-12-16 DIAGNOSIS — F17200 Nicotine dependence, unspecified, uncomplicated: Secondary | ICD-10-CM

## 2016-12-18 MED ORDER — VARENICLINE TARTRATE 0.5 MG X 11 & 1 MG X 42 PO MISC: ORAL | 0 refills | Status: DC

## 2016-12-18 MED ORDER — VARENICLINE TARTRATE 1 MG PO TABS: 1.0000 mg | ORAL_TABLET | Freq: Two times a day (BID) | ORAL | 3 refills | Status: DC

## 2016-12-18 NOTE — Addendum Note (Signed)
Addended by: Abbe AmsterdamOPLAND, Italia Wolfert C on: 12/18/2016 06:47 AM   Modules accepted: Orders

## 2016-12-30 ENCOUNTER — Telehealth: Payer: Self-pay | Admitting: Family Medicine

## 2016-12-30 NOTE — Telephone Encounter (Signed)
°  Relation to ZO:XWRUpt:self  Call back number:[phone]   Reason for call:  Patient requesting a refill ALPRAZolam (XANAX) 1 MG tablet and HYDROcodone-acetaminophen (NORCO) 10-325 MG tablet

## 2017-01-02 ENCOUNTER — Other Ambulatory Visit: Payer: Self-pay | Admitting: Emergency Medicine

## 2017-01-02 DIAGNOSIS — F411 Generalized anxiety disorder: Secondary | ICD-10-CM

## 2017-01-02 DIAGNOSIS — G8929 Other chronic pain: Secondary | ICD-10-CM

## 2017-01-02 DIAGNOSIS — M545 Low back pain, unspecified: Secondary | ICD-10-CM

## 2017-01-02 DIAGNOSIS — F4321 Adjustment disorder with depressed mood: Secondary | ICD-10-CM

## 2017-01-02 MED ORDER — HYDROCODONE-ACETAMINOPHEN 10-325 MG PO TABS: 1.0000 | ORAL_TABLET | Freq: Three times a day (TID) | ORAL | 0 refills | Status: DC | PRN

## 2017-01-02 MED ORDER — ALPRAZOLAM 1 MG PO TABS: 1.0000 mg | ORAL_TABLET | Freq: Two times a day (BID) | ORAL | 1 refills | Status: DC

## 2017-01-02 NOTE — Telephone Encounter (Signed)
Received refill request for ALPRAZolam (XANAX) 1 MG tablet and HYDROcodone-acetaminophen (NORCO) 10-325 MG tablet . Last office visit 11/07/2016. Is it ok to refill? Please advise.

## 2017-01-02 NOTE — Telephone Encounter (Signed)
Patient checking on the status of message below °

## 2017-01-02 NOTE — Telephone Encounter (Signed)
Reviewed NCCSR- no concerning findings Meds ordered this encounter  Medications  . ALPRAZolam (XANAX) 1 MG tablet    Sig: Take 1-2 tablets (1-2 mg total) by mouth 2 (two) times daily.    Dispense:  120 tablet    Refill:  1  . HYDROcodone-acetaminophen (NORCO) 10-325 MG tablet    Sig: Take 1 tablet by mouth every 8 (eight) hours as needed. Ok to fill in 30 days    Dispense:  70 tablet    Refill:  0   Called pt- he will pick these up tomorrow

## 2017-01-03 NOTE — Telephone Encounter (Signed)
Refills sent to pharmacy on 01/02/17.

## 2017-01-20 ENCOUNTER — Other Ambulatory Visit: Payer: Self-pay | Admitting: Family Medicine

## 2017-01-20 DIAGNOSIS — F17200 Nicotine dependence, unspecified, uncomplicated: Secondary | ICD-10-CM

## 2017-01-31 ENCOUNTER — Telehealth: Payer: Self-pay | Admitting: Family Medicine

## 2017-01-31 NOTE — Telephone Encounter (Signed)
°  Relation to ZO:XWRUpt:self Call back number: [phone] Pharmacy:  Reason for call: pt is needing rx HYDROcodone-acetaminophen (NORCO) 10-325 MG tablet, please call when available for pick up, pt would like to know if Dr. Patsy Lageropland can give him one refill

## 2017-02-01 ENCOUNTER — Other Ambulatory Visit: Payer: Self-pay | Admitting: Emergency Medicine

## 2017-02-01 DIAGNOSIS — G8929 Other chronic pain: Secondary | ICD-10-CM

## 2017-02-01 DIAGNOSIS — M545 Low back pain, unspecified: Secondary | ICD-10-CM

## 2017-02-01 MED ORDER — HYDROCODONE-ACETAMINOPHEN 10-325 MG PO TABS: 1.0000 | ORAL_TABLET | Freq: Three times a day (TID) | ORAL | 0 refills | Status: DC | PRN

## 2017-02-01 NOTE — Telephone Encounter (Signed)
Patient called to check on the status of his request for Hydrocodone. Plse adv

## 2017-02-01 NOTE — Telephone Encounter (Signed)
Indication for chronic opioid: chronic back pain Medication and dose: hydrocodone 10 # pills per month: 70 Last UDS date: 10/06/16- low risk Pain contract signed (Y/N): none Date narcotic database last reviewed (include red flags): today  Last rx was filled on 1/23, prior to that 12/26, 11/23  Called pt to check on him. He states that he is doing ok and is not drinking  Will refill for him

## 2017-02-23 ENCOUNTER — Other Ambulatory Visit: Payer: Self-pay | Admitting: Family Medicine

## 2017-02-23 DIAGNOSIS — M545 Low back pain: Principal | ICD-10-CM

## 2017-02-23 DIAGNOSIS — G8929 Other chronic pain: Secondary | ICD-10-CM

## 2017-02-23 MED ORDER — HYDROCODONE-ACETAMINOPHEN 10-325 MG PO TABS: 1.0000 | ORAL_TABLET | Freq: Three times a day (TID) | ORAL | 0 refills | Status: DC | PRN

## 2017-02-23 NOTE — Progress Notes (Signed)
Pt came in for an appt- he had an appt card from us.  However, he was not put on the schedule. Rescheduled for 2 weeks from now, will refill his hydrocodone today

## 2017-03-09 ENCOUNTER — Encounter: Payer: Self-pay | Admitting: Family Medicine

## 2017-03-09 ENCOUNTER — Ambulatory Visit (INDEPENDENT_AMBULATORY_CARE_PROVIDER_SITE_OTHER): Payer: BLUE CROSS/BLUE SHIELD | Admitting: Family Medicine

## 2017-03-09 VITALS — BP 172/88 | HR 90 | Temp 99.1°F | Ht 72.0 in | Wt 187.4 lb

## 2017-03-09 DIAGNOSIS — E785 Hyperlipidemia, unspecified: Secondary | ICD-10-CM

## 2017-03-09 DIAGNOSIS — M545 Low back pain, unspecified: Secondary | ICD-10-CM

## 2017-03-09 DIAGNOSIS — F4320 Adjustment disorder, unspecified: Secondary | ICD-10-CM

## 2017-03-09 DIAGNOSIS — G8929 Other chronic pain: Secondary | ICD-10-CM | POA: Diagnosis not present

## 2017-03-09 DIAGNOSIS — F4321 Adjustment disorder with depressed mood: Secondary | ICD-10-CM

## 2017-03-09 DIAGNOSIS — I10 Essential (primary) hypertension: Secondary | ICD-10-CM | POA: Diagnosis not present

## 2017-03-09 DIAGNOSIS — F17200 Nicotine dependence, unspecified, uncomplicated: Secondary | ICD-10-CM

## 2017-03-09 LAB — LIPID PANEL
Cholesterol: 178 mg/dL (ref <200)
HDL: 31 mg/dL — ABNORMAL LOW (ref >40)
Total CHOL/HDL Ratio: 5.7 Ratio — ABNORMAL HIGH (ref <5.0)
Triglycerides: 420 mg/dL — ABNORMAL HIGH (ref <150)

## 2017-03-09 MED ORDER — HYDROCODONE-ACETAMINOPHEN 10-325 MG PO TABS: 1.0000 | ORAL_TABLET | Freq: Three times a day (TID) | ORAL | 0 refills | Status: DC | PRN

## 2017-03-09 MED ORDER — AMLODIPINE BESYLATE 5 MG PO TABS: 5.0000 mg | ORAL_TABLET | Freq: Every day | ORAL | 3 refills | Status: DC

## 2017-03-09 NOTE — Progress Notes (Addendum)
[facility] [address], [address] [place], [place] [id number] [phone] [phone]  Date:  03/09/2017   Name:  Jonathan Harrington   DOB:  04/05/1956   MRN:  [id number]  PCP:  Abbe AmsterdamOPLAND,JESSICA, MD    Chief Complaint: No chief complaint on file.   History of Present Illness:  Jonathan Harrington is a 61 y.o. very pleasant male patient who presents with the following:  History of anxiety, depression, HTN, chronic pain gout.  Here today for a recheck visit. Today is the one year anniversary of the death of his late wife, Jonathan Harrington. Her death was quite traumatic for him and her really struggled in the months following.  [name] notes that he actually did pretty well today.  He went to work today and his mind was kept occupied.  They are down a couple of people right now and may have to do some overtime at work which he really does not minid  BP Readings from Last 3 Encounters:  03/09/17 (!) 172/88  11/07/16 169/95  11/07/16 (!) 155/77    He notes that he is drinking much less- he has "almost quit."  He has also cut down on his smoking- he is using chantix right now and feels like it is helping.  His desire to smoke is less.  He also finds that cutting down on drinking helps him to smoke less also  Anxiety is "doing better," he is taking his xanax 1 in the am and 1 before bed.  He will occasionally take 1 during the middle of the day but not usually He is not taking prozac- he stopped this due to diarrhea.  However he feels like his mood is ok and he is not feeling depressed  His last colonoscopy was less than 10 years ago  NCCSR reviewed.  He got xanax one month supple with 2 RF earlier this month He has been filling his hydrocodone from myself only, which he uses for his chronic lower back pain He gets hydrocodone 10, 70 at a time Last filled on 3/15, 2/22, 1/23, 12/26 No unexpected entries  He is UTD on his UDS   Patient Active Problem List   Diagnosis Date Noted  . Essential hypertension 06/06/2016  . Smoker 05/12/2016  . Left inguinal hernia 05/23/2014  . Anxiety state, unspecified 09/17/2013  . Gout 01/17/2013  . NONSPEC ABN FINDNG RAD & OTH EXAM ABDOMINAL AREA 09/16/2010  . DIVERTICULOSIS-COLON 08/13/2010  . DIVERTICULITIS, COLON 08/13/2010  . ABDOMINAL PAIN-LLQ 08/13/2010  . Nonspecific (abnormal) findings on radiological and other examination of body structure 08/13/2010  . PERSONAL HX COLONIC POLYPS 08/13/2010  . NONSPCIFC ABN FINDING RAD & OTH EXAM LUNG FIELD 08/13/2010    Past Medical History:  Diagnosis Date  . Anxiety   . Diverticulitis 2011   resulted in partial colectomy  . Essential hypertension 06/06/2016  . Full dentures   . Gout   . Hematuria   . Wears glasses     Past Surgical History:  Procedure Laterality Date  . COLON SURGERY  2011   partial colectomy  . COLONOSCOPY    . FINGER ARTHROPLASTY  1990   rt index fx  . HERNIA REPAIR    . INGUINAL HERNIA REPAIR Left 05/27/2014   Procedure: LEFT INGUINAL HERNIA REPAIR WITH MESH;  Surgeon: Wilmon ArmsMatthew K. Corliss Skainssuei, MD;  Location: [facility];  Service: General;  Laterality: Left;  . INSERTION OF MESH Left  05/27/2014   Procedure: INSERTION OF MESH;  Surgeon: Wilmon Arms. Corliss Skains, MD;  Location: [facility];  Service: General;  Laterality: Left;    Social History  Substance Use Topics  . Smoking status: Current Every Day Smoker    Packs/day: 1.50  . Smokeless tobacco: Never Used  . Alcohol use 12.6 oz/week    21 Cans of beer per week     Comment: about 3 beers daily- history of heavy liquor abuse    Family History  Problem Relation Age of Onset  . Alzheimer's disease Mother   . Cancer Sister     Allergies  Allergen Reactions  . Ibuprofen Swelling    Medication list has been reviewed and updated.  Current Outpatient Prescriptions on File Prior to Visit  Medication Sig Dispense Refill  . ALPRAZolam (XANAX) 1 MG tablet  Take 1-2 tablets (1-2 mg total) by mouth 2 (two) times daily. 120 tablet 1  . CHANTIX STARTING MONTH PAK 0.5 MG X 11 & 1 MG X 42 tablet USE AS DIRECTED 53 tablet 0  . lisinopril (PRINIVIL,ZESTRIL) 40 MG tablet Take 1 tablet (40 mg total) by mouth daily. 30 tablet 6  . Multiple Vitamins-Minerals (MULTIVITAMIN WITH MINERALS) tablet Take 1 tablet by mouth daily. Reported on 03/21/2016    . niacin (NIASPAN) 500 MG CR tablet TAKE 3 TABLETS (1,500 MG TOTAL) BY MOUTH AT BEDTIME. 270 tablet 2  . triamcinolone cream (KENALOG) 0.1 % Apply 1 application topically 2 (two) times daily. 90 g 2  . valACYclovir (VALTREX) 1000 MG tablet Take 1 tablet (1,000 mg total) by mouth 2 (two) times daily. 21 tablet 0  . varenicline (CHANTIX CONTINUING MONTH PAK) 1 MG tablet Take 1 tablet (1 mg total) by mouth 2 (two) times daily. 60 tablet 3  . allopurinol (ZYLOPRIM) 300 MG tablet TAKE 1 TABLET (300 MG TOTAL) BY MOUTH DAILY. (Patient not taking: Reported on 03/09/2017) 90 tablet 3  . colchicine 0.6 MG tablet Take 2 pills, then take 1 an hour later.  May take 1 or 2 a day as needed for gout prevention (Patient not taking: Reported on 03/09/2017) 30 tablet 0  . FLUoxetine (PROZAC) 20 MG tablet Take 1 tablet (20 mg total) by mouth daily. (Patient not taking: Reported on 03/09/2017) 30 tablet 3  . HYDROcodone-acetaminophen (NORCO) 10-325 MG tablet Take 1 tablet by mouth every 8 (eight) hours as needed. (Patient not taking: Reported on 03/09/2017) 70 tablet 0   No current facility-administered medications on file prior to visit.     Review of Systems:  As per HPI- otherwise negative.   Physical Examination: Vitals:   03/09/17 1716 03/09/17 1723  BP: (!) 170/82 (!) 172/88  Pulse: 90   Temp: 99.1 F (37.3 C)    Vitals:   03/09/17 1716  Weight: 187 lb 6.4 oz (85 kg)  Height: 6' (1.829 m)   Body mass index is 25.42 kg/m. Ideal Body Weight: Weight in (lb) to have BMI = 25: 183.9  GEN: WDWN, NAD, Non-toxic, A & O x 3,  looks well, smoker HEENT: Atraumatic, Normocephalic. Neck supple. No masses, No LAD.  Bilateral TM wnl, oropharynx normal.  PEERL,EOMI.   Ears and Nose: No external deformity. CV: RRR, No M/G/R. No JVD. No thrill. No extra heart sounds. PULM: CTA B, no wheezes, crackles, rhonchi. No retractions. No resp. distress. No accessory muscle use. EXTR: No c/c/e NEURO Normal gait.  PSYCH: Normally interactive. Conversant. Not depressed or anxious appearing.  Calm demeanor.  BP Readings from Last 3 Encounters:  03/09/17 (!) 172/88  11/07/16 169/95  11/07/16 (!) 155/77    Assessment and Plan: Essential hypertension - Plan: amLODipine (NORVASC) 5 MG tablet  Chronic bilateral low back pain without sciatica - Plan: HYDROcodone-acetaminophen (NORCO) 10-325 MG tablet  Grief reaction  Dyslipidemia - Plan: Lipid panel, CANCELED: Lipid panel  Tobacco use disorder  Here today for a follow-up visit Refilled his hydrocodone which he will fill in a few days Noted that he also needs a contract- will do for him soon Due for a lipid panel today He is working on quitting smoking and has greatly cut down on alcohol as well.  Praised these efforts, and feminded that he should not combine any sedating medications with alcohol Noted that his BP has been too high.  He feels like it is ok when he checks at home, but admits he has not been following his BP at home on a regular basis lately   Continue lisinopril 40, will add 5 mg of amlodipine to his regimen unless his BP does indeed look much better at home Lipid panel today Recheck in 3 months    Chemistry      Component Value Date/Time   NA 142 11/07/2016 1216   K 4.7 11/07/2016 1216   CL 107 11/07/2016 1216   CO2 27 11/07/2016 1216   BUN 17 11/07/2016 1216   CREATININE 0.81 11/07/2016 1216   CREATININE 0.80 05/12/2016 1706      Component Value Date/Time   CALCIUM 9.1 11/07/2016 1216   ALKPHOS 45 11/07/2016 1216   AST 26 11/07/2016 1216   ALT 20  11/07/2016 1216   BILITOT 0.8 11/07/2016 1216       Signed Abbe Amsterdam, MD  Gave him a call on 4/7.  We had been using niacin as his tris had been the biggest issue.  However he was not able to tolerate 1500 mg, and his plan only wants to pay for 500 mg a day anyway.  Would like to try a statin instead for a few months and see how he responds.  He is amenable to this  rx for crestor, will plan to repeat his cholesterol in about 6 months   Results for orders placed or performed in visit on 03/09/17  Lipid panel  Result Value Ref Range   Cholesterol 178 <200 mg/dL   Triglycerides 409 (H) <150 mg/dL   HDL 31 (L) >81 mg/dL   Total CHOL/HDL Ratio 5.7 (H) <5.0 Ratio   VLDL NOT CALC <30 mg/dL   LDL Cholesterol NOT CALC <100 mg/dL

## 2017-03-09 NOTE — Progress Notes (Signed)
Pre visit review using our clinic review tool, if applicable. No additional management support is needed unless otherwise documented below in the visit note. 

## 2017-03-09 NOTE — Patient Instructions (Signed)
I am afraid that your blood pressure is too high.  Continue taking your lisinopril- you can also monitor your BP at home. If you are generally higher than 140/85 please add the amlodipine 5mg  to control your blood pressure Please come and see me in about 3 months to check on how you are doing and take care!

## 2017-03-18 ENCOUNTER — Encounter: Payer: Self-pay | Admitting: Family Medicine

## 2017-03-18 MED ORDER — ROSUVASTATIN CALCIUM 10 MG PO TABS: 10.0000 mg | ORAL_TABLET | Freq: Every day | ORAL | 5 refills | Status: DC

## 2017-03-18 NOTE — Addendum Note (Signed)
Addended by: Abbe Amsterdam C on: 03/18/2017 05:15 PM   Modules accepted: Orders

## 2017-03-27 ENCOUNTER — Other Ambulatory Visit: Payer: Self-pay | Admitting: Emergency Medicine

## 2017-03-27 DIAGNOSIS — F17200 Nicotine dependence, unspecified, uncomplicated: Secondary | ICD-10-CM

## 2017-03-27 MED ORDER — VARENICLINE TARTRATE 1 MG PO TABS: 1.0000 mg | ORAL_TABLET | Freq: Two times a day (BID) | ORAL | 3 refills | Status: DC

## 2017-03-27 NOTE — Telephone Encounter (Signed)
Received refill request for Chantix. Last OV: 03/09/17 and last refill 01/20/17. Is it ok to refill? Please advise.

## 2017-03-31 DIAGNOSIS — H524 Presbyopia: Secondary | ICD-10-CM | POA: Diagnosis not present

## 2017-04-01 ENCOUNTER — Other Ambulatory Visit: Payer: Self-pay | Admitting: Family Medicine

## 2017-04-10 ENCOUNTER — Telehealth: Payer: Self-pay | Admitting: Family Medicine

## 2017-04-10 NOTE — Telephone Encounter (Signed)
Caller name: Quindon Denker Relationship to patient: self Can be reached: [phone] or [phone]  Reason for call: Pt requesting call from Dr. Patsy Lager regarding back pain. He said that the last person he saw told him he could not do anything about it because of the arthritis. He was told he needs to see someone for injections in his back and hip for this. Pt said that he never heard from someone and this was 6 months ago. Pt said that he can't go to work because it hurts so bad.

## 2017-04-10 NOTE — Telephone Encounter (Signed)
Patient checking on the status of message below, please advise best # [phone]

## 2017-04-10 NOTE — Telephone Encounter (Signed)
Called Zachry back- he will see me this week so I can look at his back.  He will call and schedule an appt now

## 2017-04-12 ENCOUNTER — Ambulatory Visit (INDEPENDENT_AMBULATORY_CARE_PROVIDER_SITE_OTHER): Payer: BLUE CROSS/BLUE SHIELD | Admitting: Family Medicine

## 2017-04-12 ENCOUNTER — Encounter: Payer: Self-pay | Admitting: Family Medicine

## 2017-04-12 VITALS — BP 148/78 | HR 97 | Temp 98.3°F | Ht 72.0 in | Wt 188.4 lb

## 2017-04-12 DIAGNOSIS — F411 Generalized anxiety disorder: Secondary | ICD-10-CM

## 2017-04-12 DIAGNOSIS — F1010 Alcohol abuse, uncomplicated: Secondary | ICD-10-CM

## 2017-04-12 DIAGNOSIS — M545 Low back pain, unspecified: Secondary | ICD-10-CM

## 2017-04-12 DIAGNOSIS — R194 Change in bowel habit: Secondary | ICD-10-CM

## 2017-04-12 DIAGNOSIS — Z130 Encounter for screening for diseases of the blood and blood-forming organs and certain disorders involving the immune mechanism: Secondary | ICD-10-CM | POA: Diagnosis not present

## 2017-04-12 DIAGNOSIS — F4321 Adjustment disorder with depressed mood: Secondary | ICD-10-CM

## 2017-04-12 DIAGNOSIS — G8929 Other chronic pain: Secondary | ICD-10-CM | POA: Diagnosis not present

## 2017-04-12 DIAGNOSIS — F4320 Adjustment disorder, unspecified: Secondary | ICD-10-CM | POA: Diagnosis not present

## 2017-04-12 DIAGNOSIS — Z131 Encounter for screening for diabetes mellitus: Secondary | ICD-10-CM | POA: Diagnosis not present

## 2017-04-12 MED ORDER — ALPRAZOLAM 1 MG PO TABS: 1.0000 mg | ORAL_TABLET | Freq: Two times a day (BID) | ORAL | 1 refills | Status: DC

## 2017-04-12 MED ORDER — HYDROCODONE-ACETAMINOPHEN 10-325 MG PO TABS: 1.0000 | ORAL_TABLET | Freq: Three times a day (TID) | ORAL | 0 refills | Status: DC | PRN

## 2017-04-12 NOTE — Progress Notes (Signed)
[facility] [address] [place], [place] [address] [phone]  Date:  04/12/2017   Name:  PAWAN KNECHTEL   DOB:  [birth year]   MRN:  [id number]  PCP:  Abbe Amsterdam, MD    Chief Complaint: Back Pain (c/o ongoing back pain and dizzy spells x 1-2 months.. Pt states that he did go see the back specialist. )   History of Present Illness:  VERL KITSON is a 61 y.o. very pleasant male patient who presents with the following:  Last seen here about 5 weeks ago He had called me due to his chronic lower back pain 2 days ago and I asked him to come in and see me.   He did see a "back specialist" approx one year ago- he was referred to [facility] [place].  Does not remember who he saw or what they recommended at that time.  However he does recall that he was told his problem was no in his hips as he had thought but in his back. He has not had an MRI that he can recall but did have plain films He states that he was supposed to see another orthopedist but they "never called me" so he did not follow-up "I've just been living off pain pills since then" Reviewed his NCCSR-  He has been getting #70 hydrocodone 10 from me approx every 30 days, also alprazolam periodically No other entries or anything unexpected  He has thought about retiring from his work at the [employer] but does not think he can afford to do so   He has noted that his legs feel weak over the last 2-3 weeks. Climbing stairs is difficult Notes that on occasion his legs will seem to go "like a noodle" on him and he will feel like he is going to fall. He has not actually fallen  He is drinking a fifth of liquor every 4-5 days "much less than I used to," and he does not drink anything some days per his report He is taking hydrocodone 10 BID- morning and night, and sometimes one during the day He does need a UDS today He is taking xanax 1-2 mg BID as well  He  has also noted more frequent stools over the last several months.  Last colonoscoyp was in 2009 per Dr. [name].  He had adenomatous polyps and is overdue for a recheck scope in any case.    BP Readings from Last 3 Encounters:  04/12/17 (!) 148/78  03/09/17 (!) 172/88  11/07/16 169/95      Patient Active Problem List   Diagnosis Date Noted  . Essential hypertension 06/06/2016  . Smoker 05/12/2016  . Left inguinal hernia 05/23/2014  . Anxiety state, unspecified 09/17/2013  . Gout 01/17/2013  . NONSPEC ABN FINDNG RAD & OTH EXAM ABDOMINAL AREA 09/16/2010  . DIVERTICULOSIS-COLON 08/13/2010  . DIVERTICULITIS, COLON 08/13/2010  . ABDOMINAL PAIN-LLQ 08/13/2010  . Nonspecific (abnormal) findings on radiological and other examination of body structure 08/13/2010  . PERSONAL HX COLONIC POLYPS 08/13/2010  . NONSPCIFC ABN FINDING RAD & OTH EXAM LUNG FIELD 08/13/2010    Past Medical History:  Diagnosis Date  . Anxiety   . Diverticulitis 2011   resulted in partial colectomy  . Essential hypertension 06/06/2016  . Full dentures   . Gout   . Hematuria   . Wears glasses     Past Surgical History:  Procedure Laterality Date  .  COLON SURGERY  2011   partial colectomy  . COLONOSCOPY    . FINGER ARTHROPLASTY  1990   rt index fx  . HERNIA REPAIR    . INGUINAL HERNIA REPAIR Left 05/27/2014   Procedure: LEFT INGUINAL HERNIA REPAIR WITH MESH;  Surgeon: Wilmon Arms. Corliss Skains, MD;  Location: [facility];  Service: General;  Laterality: Left;  . INSERTION OF MESH Left 05/27/2014   Procedure: INSERTION OF MESH;  Surgeon: Wilmon Arms. Corliss Skains, MD;  Location: [facility];  Service: General;  Laterality: Left;    Social History  Substance Use Topics  . Smoking status: Current Every Day Smoker    Packs/day: 1.50  . Smokeless tobacco: Never Used  . Alcohol use 12.6 oz/week    21 Cans of beer per week     Comment: about 3 beers daily- history of heavy liquor abuse    Family  History  Problem Relation Age of Onset  . Alzheimer's disease Mother   . Cancer Sister     Allergies  Allergen Reactions  . Ibuprofen Swelling    Medication list has been reviewed and updated.  Current Outpatient Prescriptions on File Prior to Visit  Medication Sig Dispense Refill  . allopurinol (ZYLOPRIM) 300 MG tablet TAKE 1 TABLET (300 MG TOTAL) BY MOUTH DAILY. (Patient not taking: Reported on 03/09/2017) 90 tablet 3  . ALPRAZolam (XANAX) 1 MG tablet Take 1-2 tablets (1-2 mg total) by mouth 2 (two) times daily. 120 tablet 1  . amLODipine (NORVASC) 5 MG tablet Take 1 tablet (5 mg total) by mouth daily. 90 tablet 3  . CHANTIX STARTING MONTH PAK 0.5 MG X 11 & 1 MG X 42 tablet USE AS DIRECTED 53 tablet 0  . colchicine 0.6 MG tablet Take 2 pills, then take 1 an hour later.  May take 1 or 2 a day as needed for gout prevention (Patient not taking: Reported on 03/09/2017) 30 tablet 0  . FLUoxetine (PROZAC) 20 MG tablet TAKE 1 TABLET BY MOUTH EVERY DAY 30 tablet 2  . HYDROcodone-acetaminophen (NORCO) 10-325 MG tablet Take 1 tablet by mouth every 8 (eight) hours as needed. 70 tablet 0  . lisinopril (PRINIVIL,ZESTRIL) 40 MG tablet Take 1 tablet (40 mg total) by mouth daily. 30 tablet 6  . Multiple Vitamins-Minerals (MULTIVITAMIN WITH MINERALS) tablet Take 1 tablet by mouth daily. Reported on 03/21/2016    . rosuvastatin (CRESTOR) 10 MG tablet Take 1 tablet (10 mg total) by mouth daily. 30 tablet 5  . triamcinolone cream (KENALOG) 0.1 % Apply 1 application topically 2 (two) times daily. 90 g 2  . valACYclovir (VALTREX) 1000 MG tablet Take 1 tablet (1,000 mg total) by mouth 2 (two) times daily. 21 tablet 0  . varenicline (CHANTIX CONTINUING MONTH PAK) 1 MG tablet Take 1 tablet (1 mg total) by mouth 2 (two) times daily. 60 tablet 3   No current facility-administered medications on file prior to visit.     Review of Systems:  As per HPI- otherwise negative.  No fever or chills No persistent  numbness of the legs No incontinence    Physical Examination: Vitals:   04/12/17 1247  BP: (!) 148/78  Pulse: 97  Temp: 98.3 F (36.8 C)   Vitals:   04/12/17 1247  Weight: 188 lb 6.4 oz (85.5 kg)  Height: 6' (1.829 m)   Body mass index is 25.55 kg/m. Ideal Body Weight: Weight in (lb) to have BMI = 25: 183.9  GEN: WDWN, NAD,  Non-toxic, A & O x 3, looks well and his normal self HEENT: Atraumatic, Normocephalic. Neck supple. No masses, No LAD. Ears and Nose: No external deformity. CV: RRR, No M/G/R. No JVD. No thrill. No extra heart sounds. PULM: CTA B, no wheezes, crackles, rhonchi. No retractions. No resp. distress. No accessory muscle use. EXTR: No c/c/e NEURO Normal gait.  PSYCH: Normally interactive. Conversant. Not depressed or anxious appearing.  Calm demeanor.  Normal strength, sensation and DTR of both LE on exam today.  He has restricted lumbar flexion but normal extension.  Negative SLR today   Assessment and Plan: Chronic bilateral low back pain without sciatica - Plan: Ambulatory referral to Orthopedic Surgery, HYDROcodone-acetaminophen (NORCO) 10-325 MG tablet  GAD (generalized anxiety disorder) - Plan: ALPRAZolam (XANAX) 1 MG tablet  Grief - Plan: ALPRAZolam (XANAX) 1 MG tablet  Bowel habit changes - Plan: Ambulatory referral to Gastroenterology, Comprehensive metabolic panel  Screening for diabetes mellitus - Plan: Hemoglobin A1c  Screening for deficiency anemia - Plan: CBC  Alcohol abuse  Here today to discuss worsening back pain.  I had referred him to orthopedics last year.  A call to [facility] [facility] reveals that he was supposed to see Dr. Ethelene Hal but it seems he did not return calls to schedule.  Advised pt that his back problems are out of my scope, and I do want him to see a spine specialist.  Will refer back to Dr. Ethelene Hal In the meantime I will continue to treat his chronic pain and anxiety Controlled substance contract today Update UDS today  Counseled  pt that it is not safe to combine alcohol with narcotics and/or benzos.  He states understanding and agrees to not take these medications if he has been drinking   Referral to GI as he is overdue for a colonoscopy and has noted some bowel changes  Gwin is doing ok and getting to a new normal following the death of his wife Delana last year.    Meds ordered this encounter  Medications  . HYDROcodone-acetaminophen (NORCO) 10-325 MG tablet    Sig: Take 1 tablet by mouth every 8 (eight) hours as needed.    Dispense:  70 tablet    Refill:  0  . ALPRAZolam (XANAX) 1 MG tablet    Sig: Take 1-2 tablets (1-2 mg total) by mouth 2 (two) times daily.    Dispense:  120 tablet    Refill:  1      Signed Abbe Amsterdam, MD

## 2017-04-12 NOTE — Patient Instructions (Signed)
Please stop by the lab for blood and your urine drug screen today  I am going to refer you to Dr. Ethelene Hal (for your back) and also Dr. [name] (your GI doctor- you are overdue for your colonoscopy)  As we discussed, you cannot drink alcohol when you are taking the hydrocodone and/ or the xanax.  This combination is dangerous and could result in respiratory depression and death   I will be in touch with your labs Please see me in 2 months for a recheck

## 2017-04-13 ENCOUNTER — Encounter: Payer: Self-pay | Admitting: Family Medicine

## 2017-04-13 DIAGNOSIS — Z79899 Other long term (current) drug therapy: Secondary | ICD-10-CM | POA: Diagnosis not present

## 2017-04-13 DIAGNOSIS — Z79891 Long term (current) use of opiate analgesic: Secondary | ICD-10-CM | POA: Diagnosis not present

## 2017-04-17 ENCOUNTER — Encounter: Payer: Self-pay | Admitting: Family Medicine

## 2017-05-16 ENCOUNTER — Encounter: Payer: Self-pay | Admitting: Family Medicine

## 2017-05-24 ENCOUNTER — Other Ambulatory Visit: Payer: Self-pay | Admitting: Emergency Medicine

## 2017-05-24 ENCOUNTER — Telehealth: Payer: Self-pay | Admitting: Family Medicine

## 2017-05-24 DIAGNOSIS — M545 Low back pain, unspecified: Secondary | ICD-10-CM

## 2017-05-24 DIAGNOSIS — G8929 Other chronic pain: Secondary | ICD-10-CM

## 2017-05-24 MED ORDER — HYDROCODONE-ACETAMINOPHEN 10-325 MG PO TABS: 1.0000 | ORAL_TABLET | Freq: Three times a day (TID) | ORAL | 0 refills | Status: DC | PRN

## 2017-05-24 NOTE — Telephone Encounter (Signed)
Relation to ZO:XWRUpt:self Call back number: work # [phone]  Or cell # [phone]  Reason for call:  Patient requesting a refill HYDROcodone-acetaminophen (NORCO) 10-325 MG tablet

## 2017-05-24 NOTE — Telephone Encounter (Signed)
Indication for chronic opioid: chronic bilateral back pain without sciatica  Medication and dose: hydrocodone 10 # pills per month: 70 Last UDS date: 5/18- low risk Pain contract signed (Y/N): 5/18 Date narcotic database last reviewed (include red flags): today  NCCSR:  Last fill 5/12, 4/15, 3/15 Nothing unexpected Last visit with me 04/12/17 Will refill today

## 2017-05-25 NOTE — Telephone Encounter (Signed)
Called pt to inform that rx is here in the office and is ready for pick up. No answer, left voicemail.

## 2017-05-25 NOTE — Telephone Encounter (Signed)
Patient called to follow up on refill. Plse adv °

## 2017-05-26 NOTE — Telephone Encounter (Signed)
Patient is needing to change his pharmarcy in his contract to [address] [facility]

## 2017-07-18 ENCOUNTER — Other Ambulatory Visit: Payer: Self-pay | Admitting: Family Medicine

## 2017-07-18 NOTE — Telephone Encounter (Signed)
°  Relation to WU:JWJXpt:self  Call back number: work # [phone] mobile # [phone] Pharmacy:  Reason for call:  Patient requesting a refill HYDROcodone-acetaminophen (NORCO) 10-325 MG tablet

## 2017-07-19 ENCOUNTER — Other Ambulatory Visit: Payer: Self-pay | Admitting: Emergency Medicine

## 2017-07-19 DIAGNOSIS — M545 Low back pain, unspecified: Secondary | ICD-10-CM

## 2017-07-19 DIAGNOSIS — G8929 Other chronic pain: Secondary | ICD-10-CM

## 2017-07-19 MED ORDER — HYDROCODONE-ACETAMINOPHEN 10-325 MG PO TABS: 1.0000 | ORAL_TABLET | Freq: Three times a day (TID) | ORAL | 0 refills | Status: DC | PRN

## 2017-07-19 NOTE — Telephone Encounter (Signed)
NCCSR: reviewed today Last filled his hydrocodone #70 on 7/13, 6/15, 5/12 He uses hydrocodone for his chronic back pain (He also filled his alprazolam 1mg  on 7/25) Ok to refill for this month and next  Please call pt and let him know his refills are ready. He will need to see me in about 2 months for a recheck Thanks!

## 2017-07-19 NOTE — Telephone Encounter (Signed)
Requesting: HYDROcodone-acetaminophen 04/12/17 controlled substance contract signed UDS sample given, low risk next screen 10/13/17. Last OV: 04/12/17 Last Refill: 05/24/2017  Please Advise

## 2017-07-20 ENCOUNTER — Ambulatory Visit: Payer: BLUE CROSS/BLUE SHIELD | Admitting: Medical

## 2017-07-20 NOTE — Telephone Encounter (Signed)
Patient is requesting status of refill, only has 4 pills.

## 2017-07-21 NOTE — Telephone Encounter (Signed)
HYDROcodone-acetaminophen (NORCO) 10-325 MG tablet-Take 1 tablet by mouth every 8 (eight) hours as needed. Last filled: 07/19/17 Rx up front.  Called patient to make him aware.  A lady picked up and stated "I'm sorry you have the wrong number."  Awaiting patient to call back.

## 2017-07-23 NOTE — Progress Notes (Signed)
[facility] [address] [place], [place] [address] [phone] [phone]  Date:  07/24/2017   Name:  Jonathan Harrington   DOB:  05/03/1956   MRN:  [id number]  PCP:  Pearline Cablesopland, Ayde Record C, MD    Chief Complaint: Irritable Bowel Syndrome (Cramping)   History of Present Illness:  Jonathan Harrington is a 61 y.o. very pleasant male patient who presents with the following:  History of diverticulitis and partial colectomy in 2011.   Last seen by myself in May of this year:  Here today to discuss worsening back pain.  I had referred him to orthopedics last year.  A call to [facility] reveals that he was supposed to see Dr. Ethelene Halamos but it seems he did not return calls to schedule.  Advised pt that his back problems are out of my scope, and I do want him to see a spine specialist.  Will refer back to Dr. Ethelene Halamos In the meantime I will continue to treat his chronic pain and anxiety Controlled substance contract today Update UDS today  Counseled pt that it is not safe to combine alcohol with narcotics and/or benzos.  He states understanding and agrees to not take these medications if he has been drinking   Referral to GI as he is overdue for a colonoscopy and has noted some bowel changes  Jonathan Harrington is doing ok and getting to a new normal following the death of his wife Delana last year.    Indication for chronic opioid: chronic back pain Medication and dose: hydrocodone 10  # pills per month: 70 Last UDS date: 5/18 Pain contract signed (Y/N): 5/18 Date narcotic database last reviewed (include red flags): 07/23/17  NCCSR:he filled alprazolam on 7/25 and hydrocodone about one month ago   BP Readings from Last 3 Encounters:  07/24/17 (!) 186/103  04/12/17 (!) 148/78  03/09/17 (!) 172/88    He stopped taking all his meds 1-2 months ago.   He has been moved a couple of times recently due to cost of his home.   He lost track of his medications when he  was moving but he thinks they are at his son's house- he should be able to pick them up and restart Right now he is living "in a boarding house with 3 other guys."  Life has not been easy for Jonathan Harrington since his wife died.  He is trying to keep a positive attitude  He notes that he often has abdominal cramping in the mornings and may have multiple BM while at work.  This can cause some problems because he is in the bathroom when he is supposed to be working.  He will feel better after he goes to the bathroom and has a BM Later in the day he generally feels ok.     Wt Readings from Last 3 Encounters:  07/24/17 181 lb 12.8 oz (82.5 kg)  04/12/17 188 lb 6.4 oz (85.5 kg)  03/09/17 187 lb 6.4 oz (85 kg)   He is working on cutting down on smoking, he is taking chantix He notes that his drinking is "not worse."  However he does continue to drink alcohol on a regular basis.  Assures me that he does not take his hydrocodone if he has been drinking Patient Active Problem List   Diagnosis Date Noted  . Essential hypertension 06/06/2016  . Smoker 05/12/2016  . Left inguinal hernia 05/23/2014  . Anxiety  state, unspecified 09/17/2013  . Gout 01/17/2013  . NONSPEC ABN FINDNG RAD & OTH EXAM ABDOMINAL AREA 09/16/2010  . DIVERTICULOSIS-COLON 08/13/2010  . DIVERTICULITIS, COLON 08/13/2010  . ABDOMINAL PAIN-LLQ 08/13/2010  . Nonspecific (abnormal) findings on radiological and other examination of body structure 08/13/2010  . PERSONAL HX COLONIC POLYPS 08/13/2010  . NONSPCIFC ABN FINDING RAD & OTH EXAM LUNG FIELD 08/13/2010    Past Medical History:  Diagnosis Date  . Anxiety   . Diverticulitis 2011   resulted in partial colectomy  . Essential hypertension 06/06/2016  . Full dentures   . Gout   . Hematuria   . Wears glasses     Past Surgical History:  Procedure Laterality Date  . COLON SURGERY  2011   partial colectomy  . COLONOSCOPY    . FINGER ARTHROPLASTY  1990   rt index fx  . HERNIA  REPAIR    . INGUINAL HERNIA REPAIR Left 05/27/2014   Procedure: LEFT INGUINAL HERNIA REPAIR WITH MESH;  Surgeon: Wilmon Arms. Corliss Skains, MD;  Location: [facility];  Service: General;  Laterality: Left;  . INSERTION OF MESH Left 05/27/2014   Procedure: INSERTION OF MESH;  Surgeon: Wilmon Arms. Corliss Skains, MD;  Location: [facility];  Service: General;  Laterality: Left;    Social History  Substance Use Topics  . Smoking status: Current Every Day Smoker    Packs/day: 1.50  . Smokeless tobacco: Never Used  . Alcohol use 12.6 oz/week    21 Cans of beer per week     Comment: about 3 beers daily- history of heavy liquor abuse    Family History  Problem Relation Age of Onset  . Alzheimer's disease Mother   . Cancer Sister     Allergies  Allergen Reactions  . Ibuprofen Swelling    Medication list has been reviewed and updated.  Current Outpatient Prescriptions on File Prior to Visit  Medication Sig Dispense Refill  . allopurinol (ZYLOPRIM) 300 MG tablet TAKE 1 TABLET (300 MG TOTAL) BY MOUTH DAILY. (Patient not taking: Reported on 03/09/2017) 90 tablet 3  . ALPRAZolam (XANAX) 1 MG tablet Take 1-2 tablets (1-2 mg total) by mouth 2 (two) times daily. (Patient not taking: Reported on 07/24/2017) 120 tablet 1  . amLODipine (NORVASC) 5 MG tablet Take 1 tablet (5 mg total) by mouth daily. (Patient not taking: Reported on 07/24/2017) 90 tablet 3  . CHANTIX STARTING MONTH PAK 0.5 MG X 11 & 1 MG X 42 tablet USE AS DIRECTED (Patient not taking: Reported on 07/24/2017) 53 tablet 0  . colchicine 0.6 MG tablet Take 2 pills, then take 1 an hour later.  May take 1 or 2 a day as needed for gout prevention (Patient not taking: Reported on 03/09/2017) 30 tablet 0  . FLUoxetine (PROZAC) 20 MG tablet TAKE 1 TABLET BY MOUTH EVERY DAY (Patient not taking: Reported on 07/24/2017) 30 tablet 2  . HYDROcodone-acetaminophen (NORCO) 10-325 MG tablet Take 1 tablet by mouth every 8 (eight) hours as needed.  (Patient not taking: Reported on 07/24/2017) 70 tablet 0  . lisinopril (PRINIVIL,ZESTRIL) 40 MG tablet Take 1 tablet (40 mg total) by mouth daily. (Patient not taking: Reported on 07/24/2017) 30 tablet 6  . Multiple Vitamins-Minerals (MULTIVITAMIN WITH MINERALS) tablet Take 1 tablet by mouth daily. Reported on 03/21/2016    . rosuvastatin (CRESTOR) 10 MG tablet Take 1 tablet (10 mg total) by mouth daily. (Patient not taking: Reported on 07/24/2017) 30 tablet 5  . triamcinolone  cream (KENALOG) 0.1 % Apply 1 application topically 2 (two) times daily. (Patient not taking: Reported on 07/24/2017) 90 g 2  . valACYclovir (VALTREX) 1000 MG tablet Take 1 tablet (1,000 mg total) by mouth 2 (two) times daily. (Patient not taking: Reported on 07/24/2017) 21 tablet 0  . varenicline (CHANTIX CONTINUING MONTH PAK) 1 MG tablet Take 1 tablet (1 mg total) by mouth 2 (two) times daily. (Patient not taking: Reported on 07/24/2017) 60 tablet 3   No current facility-administered medications on file prior to visit.     Review of Systems:  As per HPI- otherwise negative.   Physical Examination: Vitals:   07/24/17 1358  BP: (!) 186/103  Pulse: 97  Temp: 98.2 F (36.8 C)  SpO2: 95%   Vitals:   07/24/17 1358  Weight: 181 lb 12.8 oz (82.5 kg)  Height: 6' (1.829 m)   Body mass index is 24.66 kg/m. Ideal Body Weight: Weight in (lb) to have BMI = 25: 183.9  GEN: WDWN, NAD, Non-toxic, A & O x 3, appears his normal self HEENT: Atraumatic, Normocephalic. Neck supple. No masses, No LAD. Ears and Nose: No external deformity. CV: RRR, No M/G/R. No JVD. No thrill. No extra heart sounds. PULM: CTA B, no wheezes, crackles, rhonchi. No retractions. No resp. distress. No accessory muscle use. ABD: S, NT, ND, +BS. No rebound. No HSM. EXTR: No c/c/e NEURO Normal gait.  PSYCH: Normally interactive. Conversant. Not depressed or anxious appearing.  Calm demeanor.    Assessment and Plan: Bowel habit changes  Chronic  bilateral low back pain without sciatica - Plan: HYDROcodone-acetaminophen (NORCO) 10-325 MG tablet  Essential hypertension  Alcohol abuse  Asked him to restart his BP meds and he plans to do so Refilled hydrocodone.  He asks me to increase the amount of pills per month- admits that he sometimes takes more than he is written for to control his pain.  Asked him to please work on getting off alcohol. If he is able to stop drinking we can think about increasing his rx.  He will try and let me know how he is doing in a couple of weeks He will try using some otc imodium as needed for diarrhea.  Note for his job regarding his history of partial colectomy.  I have referred him to GI for an evaluation but he has not gone- finances are a big concern for him right now.  Will remind him again at next contact with patient   Signed Abbe Amsterdam, MD

## 2017-07-24 ENCOUNTER — Ambulatory Visit (INDEPENDENT_AMBULATORY_CARE_PROVIDER_SITE_OTHER): Payer: BLUE CROSS/BLUE SHIELD | Admitting: Family Medicine

## 2017-07-24 VITALS — BP 186/103 | HR 97 | Temp 98.2°F | Ht 72.0 in | Wt 181.8 lb

## 2017-07-24 DIAGNOSIS — R194 Change in bowel habit: Secondary | ICD-10-CM | POA: Diagnosis not present

## 2017-07-24 DIAGNOSIS — M545 Low back pain, unspecified: Secondary | ICD-10-CM

## 2017-07-24 DIAGNOSIS — G8929 Other chronic pain: Secondary | ICD-10-CM

## 2017-07-24 DIAGNOSIS — F1010 Alcohol abuse, uncomplicated: Secondary | ICD-10-CM | POA: Diagnosis not present

## 2017-07-24 DIAGNOSIS — I10 Essential (primary) hypertension: Secondary | ICD-10-CM

## 2017-07-24 MED ORDER — HYDROCODONE-ACETAMINOPHEN 10-325 MG PO TABS: 1.0000 | ORAL_TABLET | Freq: Three times a day (TID) | ORAL | 0 refills | Status: DC | PRN

## 2017-07-24 NOTE — Patient Instructions (Addendum)
It was good to see you today- I am sorry for all the trouble you have had recently.   We will refill your pain medication for you today Please do work on quitting drinking gradually over the next couple of weeks. If you are able to stop drinking we may consider increasing your pain medication rx  Please start back on your blood pressure meds- amlodipine, lisinopril as your BP is quite high today

## 2017-08-23 ENCOUNTER — Telehealth: Payer: Self-pay | Admitting: Family Medicine

## 2017-08-23 ENCOUNTER — Other Ambulatory Visit: Payer: Self-pay | Admitting: Emergency Medicine

## 2017-08-23 ENCOUNTER — Telehealth: Payer: Self-pay | Admitting: Medical

## 2017-08-23 DIAGNOSIS — M545 Low back pain: Principal | ICD-10-CM

## 2017-08-23 DIAGNOSIS — G8929 Other chronic pain: Secondary | ICD-10-CM

## 2017-08-23 MED ORDER — HYDROCODONE-ACETAMINOPHEN 10-325 MG PO TABS: 1.0000 | ORAL_TABLET | Freq: Three times a day (TID) | ORAL | 0 refills | Status: DC | PRN

## 2017-08-23 NOTE — Telephone Encounter (Signed)
NCCSR: nothing unexpected.  He is due to pick up his hydrocodone today or tomorrow UDS is UTD Contract is signed Ok to refill I am not in office but Ramon Dredgedward is able to sign for me.  Will forward to him Pt gets #70 hydrocodone 10

## 2017-08-23 NOTE — Telephone Encounter (Signed)
Dr. Dallas Schimkeopeland was not in office today in one of your patients needed refill of his hydrocodone. She will be in office tomorrow either so I am feeling his hydrocodone at her request. She chest checked the database in it appears he is up-to-date on contract etc.  So I am printing the prescription signing it and placed it on Jasmine's so she can contact him tomorrow.

## 2017-08-23 NOTE — Telephone Encounter (Signed)
Request sent to provider.
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
## 2017-08-24 NOTE — Telephone Encounter (Signed)
Called and spoke with pt, let him know rx is ready to pick up

## 2017-08-28 ENCOUNTER — Other Ambulatory Visit: Payer: Self-pay | Admitting: Family Medicine

## 2017-08-28 DIAGNOSIS — I10 Essential (primary) hypertension: Secondary | ICD-10-CM

## 2017-08-29 NOTE — Telephone Encounter (Signed)
°  Relation to pt: self Call back number: [phone] /  Pharmacy: [id number] - [place], [place] - [address] [phone] (Phone) [phone] (Fax)   D.O.D Dr. Abner Greenspan  Reason for call:  Patient checking on the status of lisinopril (PRINIVIL,ZESTRIL) 40 MG tablet refill, patient states he feels tired and hes been without medication for 5 days requesting covering [facility] to review due to PCP being out of the office, please advise

## 2017-08-30 NOTE — Telephone Encounter (Signed)
Notified pt rx is ready for pick up  

## 2017-09-04 ENCOUNTER — Telehealth: Payer: Self-pay | Admitting: Family Medicine

## 2017-09-04 DIAGNOSIS — F4321 Adjustment disorder with depressed mood: Secondary | ICD-10-CM

## 2017-09-04 DIAGNOSIS — F411 Generalized anxiety disorder: Secondary | ICD-10-CM

## 2017-09-07 ENCOUNTER — Other Ambulatory Visit: Payer: Self-pay | Admitting: Emergency Medicine

## 2017-09-07 DIAGNOSIS — F411 Generalized anxiety disorder: Secondary | ICD-10-CM

## 2017-09-07 DIAGNOSIS — F4321 Adjustment disorder with depressed mood: Secondary | ICD-10-CM

## 2017-09-07 MED ORDER — ALPRAZOLAM 1 MG PO TABS: 1.0000 mg | ORAL_TABLET | Freq: Two times a day (BID) | ORAL | 1 refills | Status: DC | PRN

## 2017-09-07 NOTE — Telephone Encounter (Signed)
NCCSR: he last filled alprazolam in May Ok to refill today

## 2017-09-07 NOTE — Telephone Encounter (Signed)
Requesting: ALPRAZolam Prudy Feeler) 1 MG tablet Contract: 04/12/17 controlled substance contract signed uds sample given, low risk next screen 10/13/17. Last OV: 07/24/2017 Last Refill: 05/01/2017  Please Advise

## 2017-09-07 NOTE — Telephone Encounter (Signed)
Pt called regarding about status of rx, pt was informed it was in process and as soon rx is ready he will receive a call.

## 2017-09-11 ENCOUNTER — Other Ambulatory Visit: Payer: Self-pay | Admitting: Emergency Medicine

## 2017-09-25 ENCOUNTER — Other Ambulatory Visit: Payer: Self-pay | Admitting: Family Medicine

## 2017-09-25 ENCOUNTER — Telehealth: Payer: Self-pay | Admitting: Family Medicine

## 2017-09-25 DIAGNOSIS — F411 Generalized anxiety disorder: Secondary | ICD-10-CM

## 2017-09-25 DIAGNOSIS — I10 Essential (primary) hypertension: Secondary | ICD-10-CM

## 2017-09-25 DIAGNOSIS — F4321 Adjustment disorder with depressed mood: Secondary | ICD-10-CM

## 2017-09-25 DIAGNOSIS — E785 Hyperlipidemia, unspecified: Secondary | ICD-10-CM

## 2017-09-25 NOTE — Telephone Encounter (Signed)
Relation to ZO:XWRU Call back number:[phone] Pharmacy:  Reason for call:  Patient states he typically receives 90 day supply of all he's medication lisinopril (PRINIVIL,ZESTRIL) 40 MG tablet, xanax, patient would like to know if hes due for a follow up please advise

## 2017-09-26 ENCOUNTER — Other Ambulatory Visit: Payer: Self-pay | Admitting: Family Medicine

## 2017-09-26 DIAGNOSIS — G8929 Other chronic pain: Secondary | ICD-10-CM

## 2017-09-26 DIAGNOSIS — M545 Low back pain: Principal | ICD-10-CM

## 2017-09-26 MED ORDER — ROSUVASTATIN CALCIUM 10 MG PO TABS: 10.0000 mg | ORAL_TABLET | Freq: Every day | ORAL | 3 refills | Status: DC

## 2017-09-26 MED ORDER — ALPRAZOLAM 1 MG PO TABS: 1.0000 mg | ORAL_TABLET | Freq: Two times a day (BID) | ORAL | 0 refills | Status: DC | PRN

## 2017-09-26 MED ORDER — AMLODIPINE BESYLATE 5 MG PO TABS: 5.0000 mg | ORAL_TABLET | Freq: Every day | ORAL | 3 refills | Status: DC

## 2017-09-26 MED ORDER — LISINOPRIL 40 MG PO TABS: 40.0000 mg | ORAL_TABLET | Freq: Every day | ORAL | 3 refills | Status: DC

## 2017-09-26 NOTE — Telephone Encounter (Signed)
Refilled lisinopril, crestor, amlodipine all for 90 with 3 RF  NCCSR: he filled a 30 day supply of alprazolam on 9/27- will call in for him today although he will not be able to fill yet  Will call in a 90 day supply of his alprazolam

## 2017-09-26 NOTE — Telephone Encounter (Signed)
Self.   Refill for CHANTIX and HYDROcodone    Pharmacy:   [id number] - [place], [place] - [address]

## 2017-09-27 MED ORDER — HYDROCODONE-ACETAMINOPHEN 10-325 MG PO TABS: 1.0000 | ORAL_TABLET | Freq: Three times a day (TID) | ORAL | 0 refills | Status: DC | PRN

## 2017-09-27 MED ORDER — VARENICLINE TARTRATE 1 MG PO TABS: 1.0000 mg | ORAL_TABLET | Freq: Two times a day (BID) | ORAL | 3 refills | Status: DC

## 2017-09-27 NOTE — Telephone Encounter (Signed)
NCCSR: ok to refill hydrocodone today

## 2017-09-27 NOTE — Telephone Encounter (Signed)
Called pt and let him know I refilled hydrocodone and also chantix for him

## 2017-10-03 NOTE — Progress Notes (Signed)
[facility] [address] [place], [place] [address] [phone]  Date:  10/04/2017   Name:  Jonathan Harrington   DOB:  07/21/1956   MRN:  [id number]  PCP:  Pearline Cables, MD    Chief Complaint: Generalized Body Aches (c/o body aches, sore throat, loss of engery x 4 days. )   History of Present Illness:  Jonathan Harrington is a 61 y.o. very pleasant male patient who presents with the following:  Follow-up visit today History of HTN, smoking, anxiety/ depression, chronic joint pain  He notes illness today- he got sick this past Saturday with chills.  (Today is Wednesday).  He had severe coughing and occasional post- tussive emesis.  He continues to cough in the morning Continues to smoke He has a ST He notes body aches He has felt feverish but did not check his temp  No other changes in his bowel habits No sick contacts that he is aware of   NCCSR:  Filled hydrocodone on 10/18, and his xanax last month- I had called in his xanax a few days ago but he was not able to fill this. I will print out for him and he will fill it at the pharmacy Contract and UDS in May of 2018  [facility]  Lipids due but pt is not fasting today    Patient Active Problem List   Diagnosis Date Noted  . Essential hypertension 06/06/2016  . Smoker 05/12/2016  . Left inguinal hernia 05/23/2014  . Anxiety state, unspecified 09/17/2013  . Gout 01/17/2013  . NONSPEC ABN FINDNG RAD & OTH EXAM ABDOMINAL AREA 09/16/2010  . DIVERTICULOSIS-COLON 08/13/2010  . DIVERTICULITIS, COLON 08/13/2010  . ABDOMINAL PAIN-LLQ 08/13/2010  . Nonspecific (abnormal) findings on radiological and other examination of body structure 08/13/2010  . PERSONAL HX COLONIC POLYPS 08/13/2010  . NONSPCIFC ABN FINDING RAD & OTH EXAM LUNG FIELD 08/13/2010    Past Medical History:  Diagnosis Date  . Anxiety   . Diverticulitis 2011   resulted in  partial colectomy  . Essential hypertension 06/06/2016  . Full dentures   . Gout   . Hematuria   . Wears glasses     Past Surgical History:  Procedure Laterality Date  . COLON SURGERY  2011   partial colectomy  . COLONOSCOPY    . FINGER ARTHROPLASTY  1990   rt index fx  . HERNIA REPAIR    . INGUINAL HERNIA REPAIR Left 05/27/2014   Procedure: LEFT INGUINAL HERNIA REPAIR WITH MESH;  Surgeon: Wilmon Arms. Corliss Skains, MD;  Location: [facility];  Service: General;  Laterality: Left;  . INSERTION OF MESH Left 05/27/2014   Procedure: INSERTION OF MESH;  Surgeon: Wilmon Arms. Corliss Skains, MD;  Location: [facility];  Service: General;  Laterality: Left;    Social History  Substance Use Topics  . Smoking status: Current Every Day Smoker    Packs/day: 1.50  . Smokeless tobacco: Never Used  . Alcohol use 12.6 oz/week    21 Cans of beer per week     Comment: about 3 beers daily- history of heavy liquor abuse    Family History  Problem Relation Age of Onset  . Alzheimer's disease Mother   . Cancer Sister     Allergies  Allergen Reactions  . Ibuprofen Swelling    Medication list has been reviewed and updated.  Current Outpatient Prescriptions on File Prior to Visit  Medication Sig Dispense Refill  . allopurinol (ZYLOPRIM) 300 MG tablet TAKE 1 TABLET (300 MG TOTAL) BY MOUTH DAILY. (Patient not taking: Reported on 03/09/2017) 90 tablet 3  . ALPRAZolam (XANAX) 1 MG tablet Take 1 tablet (1 mg total) by mouth 2 (two) times daily as needed for anxiety. 180 tablet 0  . amLODipine (NORVASC) 5 MG tablet Take 1 tablet (5 mg total) by mouth daily. 90 tablet 3  . FLUoxetine (PROZAC) 20 MG tablet TAKE 1 TABLET BY MOUTH EVERY DAY (Patient not taking: Reported on 07/24/2017) 30 tablet 2  . HYDROcodone-acetaminophen (NORCO) 10-325 MG tablet Take 1 tablet by mouth every 8 (eight) hours as needed. 70 tablet 0  . lisinopril (PRINIVIL,ZESTRIL) 40 MG tablet Take 1 tablet (40 mg total) by  mouth daily. 90 tablet 3  . Multiple Vitamins-Minerals (MULTIVITAMIN WITH MINERALS) tablet Take 1 tablet by mouth daily. Reported on 03/21/2016    . rosuvastatin (CRESTOR) 10 MG tablet Take 1 tablet (10 mg total) by mouth daily. 90 tablet 3  . valACYclovir (VALTREX) 1000 MG tablet Take 1 tablet (1,000 mg total) by mouth 2 (two) times daily. (Patient not taking: Reported on 07/24/2017) 21 tablet 0  . varenicline (CHANTIX CONTINUING MONTH PAK) 1 MG tablet Take 1 tablet (1 mg total) by mouth 2 (two) times daily. 60 tablet 3   No current facility-administered medications on file prior to visit.     Review of Systems:  As per HPI- otherwise negative. No rash   Physical Examination: Vitals:   10/04/17 1336  BP: 122/62  Pulse: 100  Temp: 98.1 F (36.7 C)  SpO2: 97%   Vitals:   10/04/17 1336  Weight: 183 lb 12.8 oz (83.4 kg)  Height: 6' (1.829 m)   Body mass index is 24.93 kg/m. Ideal Body Weight: Weight in (lb) to have BMI = 25: 183.9  GEN: WDWN, NAD, Non-toxic, A & O x 3, looks well but is coughing some  HEENT: Atraumatic, Normocephalic. Neck supple. No masses, No LAD.  Bilateral TM wnl, oropharynx normal.  PEERL,EOMI.   Ears and Nose: No external deformity. CV: RRR, No M/G/R. No JVD. No thrill. No extra heart sounds. PULM: CTA B, no wheezes, crackles, rhonchi. No retractions. No resp. distress. No accessory muscle use. EXTR: No c/c/e NEURO Normal gait.  PSYCH: Normally interactive. Conversant. Not depressed or anxious appearing.  Calm demeanor.   Dg Chest 2 View  Result Date: 10/04/2017 CLINICAL DATA:  Cough and malaise EXAM: CHEST  2 VIEW COMPARISON:  09/04/2015 FINDINGS: Hyperinflation with mild interstitial opacity suggesting bronchial inflammation. No consolidation or effusion. Normal cardiomediastinal silhouette with atherosclerosis. No pneumothorax. IMPRESSION: Mild bronchitic changes.  No focal infiltrate is seen. Electronically Signed   By: Jasmine PangKim  Fujinaga M.D.   On:  10/04/2017 14:17   Results for orders placed or performed in visit on 10/04/17  POCT Influenza A/B  Result Value Ref Range   Influenza A, POC Negative Negative   Influenza B, POC Negative Negative  POCT rapid strep A  Result Value Ref Range   Rapid Strep A Screen Negative Negative  POCT urinalysis dipstick  Result Value Ref Range   Color, UA yellow yellow   Clarity, UA clear clear   Glucose, UA negative negative mg/dL   Bilirubin, UA negative negative   Ketones, POC UA negative negative mg/dL   Spec Grav, UA >=4.098>=1.030 (A) 1.010 - 1.025   Blood, UA negative negative   pH,  UA 6.0 5.0 - 8.0   Protein Ur, POC negative negative mg/dL   Urobilinogen, UA 0.2 0.2 or 1.0 E.U./dL   Nitrite, UA Negative Negative   Leukocytes, UA Negative Negative    Assessment and Plan: Acute bronchitis, unspecified organism - Plan: doxycycline (VIBRAMYCIN) 100 MG capsule  Essential hypertension  Dyslipidemia  GAD (generalized anxiety disorder) - Plan: ALPRAZolam (XANAX) 1 MG tablet  Chronic bilateral low back pain without sciatica  Body aches - Plan: POCT Influenza A/B, DG Chest 2 View  Sore throat - Plan: POCT rapid strep A  Dark urine - Plan: POCT urinalysis dipstick  Cough - Plan: DG Chest 2 View  Grief - Plan: ALPRAZolam (XANAX) 1 MG tablet, benzonatate (TESSALON) 100 MG capsule  Here today with bronchitis for several days with possible fevers and chills Will cover with doxycycline Also tessalon perles Gave him a printed rx for his xanax today UA is benign Did not check lipids today as he is not fasting  BP looks fine today  Signed Abbe Amsterdam, MD

## 2017-10-04 ENCOUNTER — Ambulatory Visit (HOSPITAL_BASED_OUTPATIENT_CLINIC_OR_DEPARTMENT_OTHER)
Admission: RE | Admit: 2017-10-04 | Discharge: 2017-10-04 | Disposition: A | Discharge status: Home / Self Care | Payer: BLUE CROSS/BLUE SHIELD | Source: Ambulatory Visit | Attending: Family Medicine | Admitting: Family Medicine

## 2017-10-04 ENCOUNTER — Ambulatory Visit (INDEPENDENT_AMBULATORY_CARE_PROVIDER_SITE_OTHER): Payer: BLUE CROSS/BLUE SHIELD | Admitting: Family Medicine

## 2017-10-04 VITALS — BP 122/62 | HR 90 | Temp 98.1°F | Ht 72.0 in | Wt 183.8 lb

## 2017-10-04 DIAGNOSIS — G8929 Other chronic pain: Secondary | ICD-10-CM

## 2017-10-04 DIAGNOSIS — R82998 Other abnormal findings in urine: Secondary | ICD-10-CM | POA: Diagnosis not present

## 2017-10-04 DIAGNOSIS — J40 Bronchitis, not specified as acute or chronic: Secondary | ICD-10-CM | POA: Insufficient documentation

## 2017-10-04 DIAGNOSIS — R059 Cough, unspecified: Secondary | ICD-10-CM

## 2017-10-04 DIAGNOSIS — R52 Pain, unspecified: Secondary | ICD-10-CM | POA: Diagnosis not present

## 2017-10-04 DIAGNOSIS — R05 Cough: Secondary | ICD-10-CM | POA: Diagnosis not present

## 2017-10-04 DIAGNOSIS — M545 Low back pain: Secondary | ICD-10-CM | POA: Diagnosis not present

## 2017-10-04 DIAGNOSIS — I10 Essential (primary) hypertension: Secondary | ICD-10-CM | POA: Diagnosis not present

## 2017-10-04 DIAGNOSIS — E785 Hyperlipidemia, unspecified: Secondary | ICD-10-CM

## 2017-10-04 DIAGNOSIS — F4321 Adjustment disorder with depressed mood: Secondary | ICD-10-CM | POA: Diagnosis not present

## 2017-10-04 DIAGNOSIS — J029 Acute pharyngitis, unspecified: Secondary | ICD-10-CM

## 2017-10-04 DIAGNOSIS — F411 Generalized anxiety disorder: Secondary | ICD-10-CM | POA: Diagnosis not present

## 2017-10-04 DIAGNOSIS — J209 Acute bronchitis, unspecified: Secondary | ICD-10-CM | POA: Diagnosis not present

## 2017-10-04 LAB — POCT URINALYSIS DIP (MANUAL ENTRY)
BILIRUBIN UA: NEGATIVE mg/dL
Bilirubin, UA: NEGATIVE
Blood, UA: NEGATIVE
GLUCOSE UA: NEGATIVE mg/dL
Leukocytes, UA: NEGATIVE
Nitrite, UA: NEGATIVE
PROTEIN UA: NEGATIVE mg/dL
UROBILINOGEN UA: 0.2 U/dL
pH, UA: 6 (ref 5.0–8.0)

## 2017-10-04 LAB — POCT RAPID STREP A (OFFICE): Rapid Strep A Screen: NEGATIVE

## 2017-10-04 LAB — POCT INFLUENZA A/B
INFLUENZA A, POC: NEGATIVE
Influenza B, POC: NEGATIVE

## 2017-10-04 MED ORDER — BENZONATATE 100 MG PO CAPS: 100.0000 mg | ORAL_CAPSULE | Freq: Three times a day (TID) | ORAL | 0 refills | Status: DC | PRN

## 2017-10-04 MED ORDER — DOXYCYCLINE HYCLATE 100 MG PO CAPS: 100.0000 mg | ORAL_CAPSULE | Freq: Two times a day (BID) | ORAL | 0 refills | Status: DC

## 2017-10-04 MED ORDER — ALPRAZOLAM 1 MG PO TABS: 1.0000 mg | ORAL_TABLET | Freq: Two times a day (BID) | ORAL | 0 refills | Status: DC | PRN

## 2017-10-04 NOTE — Patient Instructions (Signed)
I am sorry you are not feeling well today!  It does look like you have bronchitis We will treat this with doxycycline for 10 days- take with food and water.  I also gave you some tessalon perles to use for cough You can refill your alprazolam at the drug stroe  Please let me know if your symptoms are not better in the next few days!

## 2017-10-23 ENCOUNTER — Other Ambulatory Visit: Payer: Self-pay | Admitting: Family Medicine

## 2017-10-23 DIAGNOSIS — I10 Essential (primary) hypertension: Secondary | ICD-10-CM

## 2017-10-24 ENCOUNTER — Telehealth: Payer: Self-pay | Admitting: Family Medicine

## 2017-10-24 NOTE — Telephone Encounter (Signed)
Pt request update [facility] ONLY [facility] [address]

## 2017-10-27 ENCOUNTER — Telehealth: Payer: Self-pay | Admitting: Family Medicine

## 2017-10-27 NOTE — Telephone Encounter (Signed)
NARCO 10 - 325.  Pt states it is time for refill Sunday. He would like to pick it up after work Monday 10/30/17. [name] pt.

## 2017-10-29 IMAGING — DX DG CHEST 2V
2 series · 2 of 2 positions shown · non-contrast
Comparison: 09/04/2015

CLINICAL DATA: Cough and malaise

EXAM:
CHEST  2 VIEW

[chest pa]
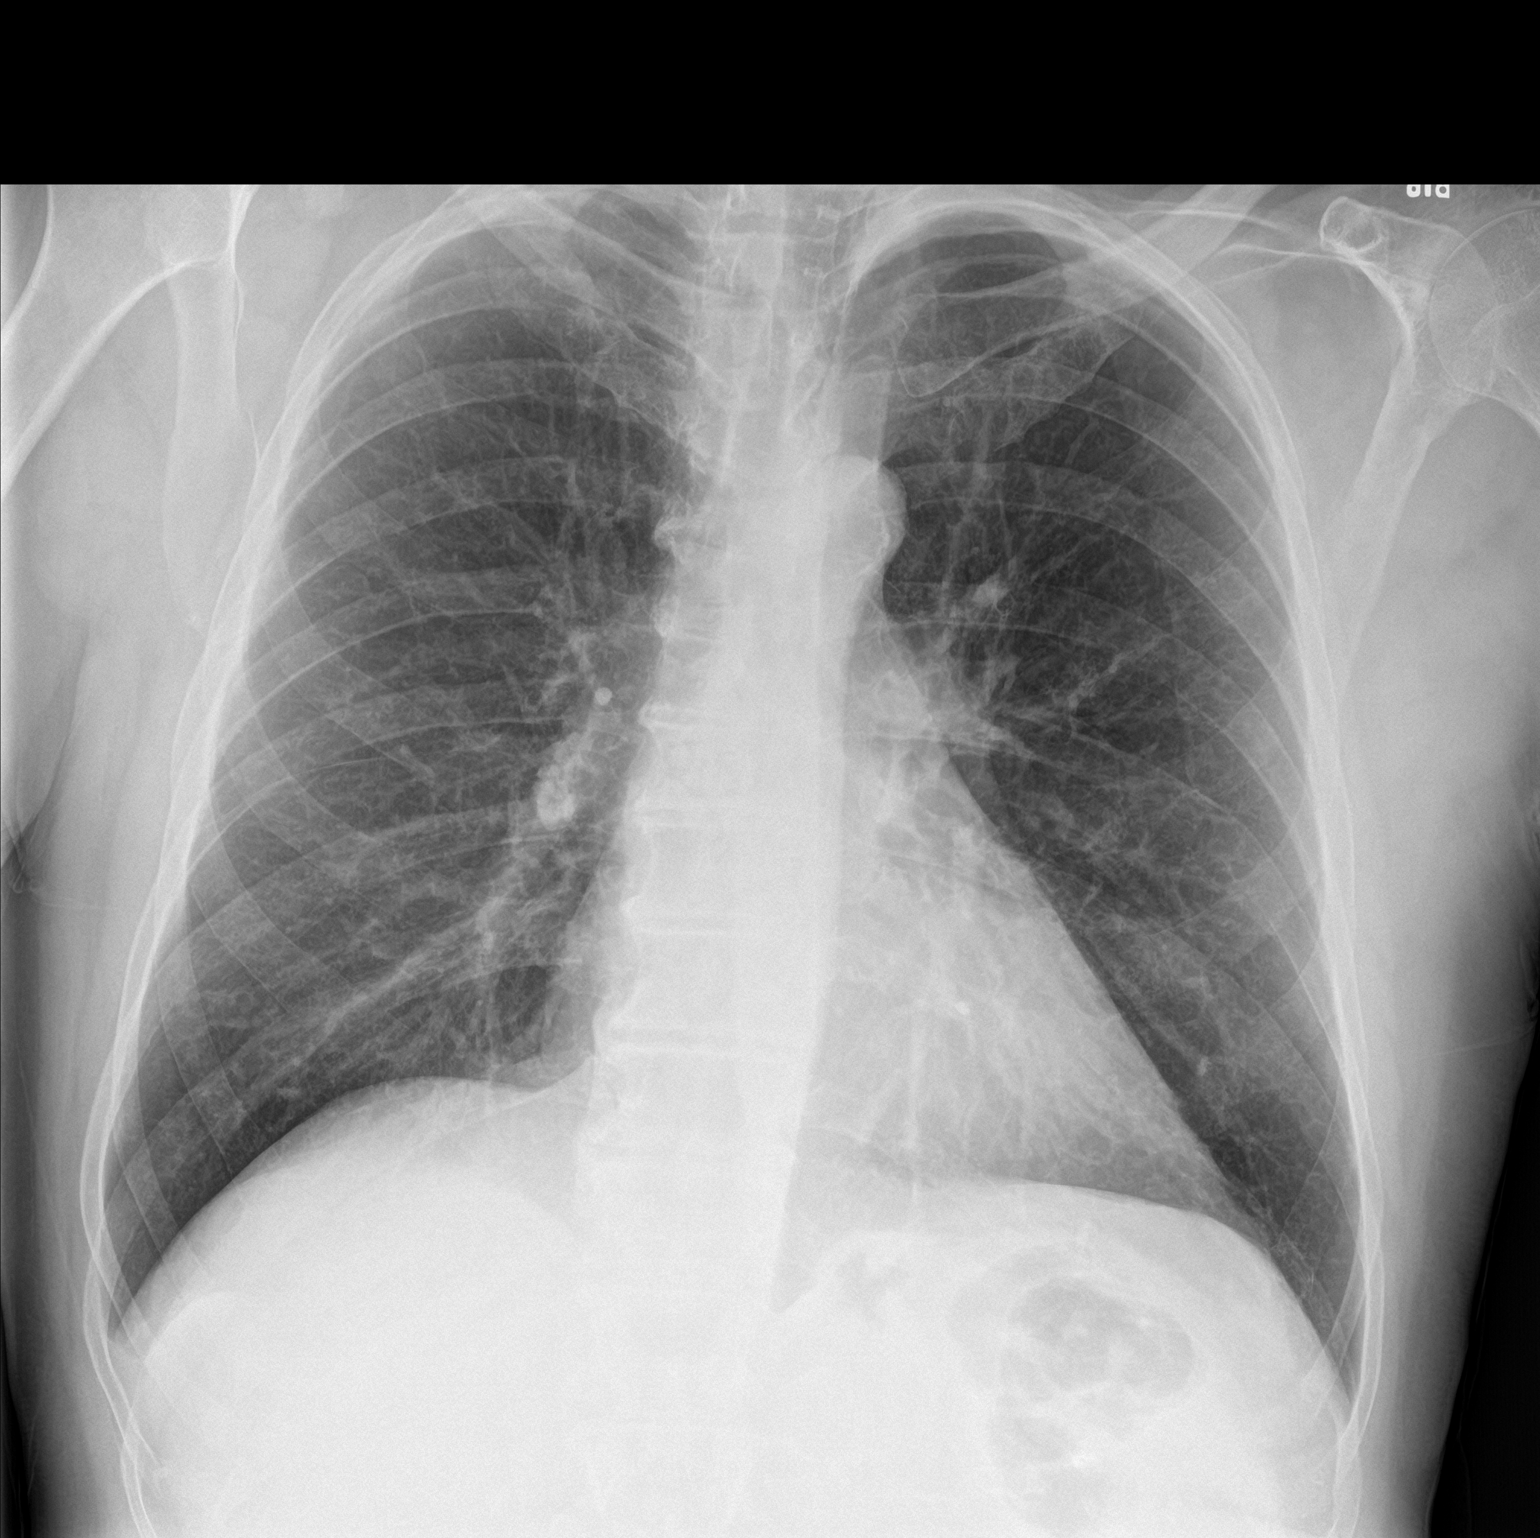

[chest lat]
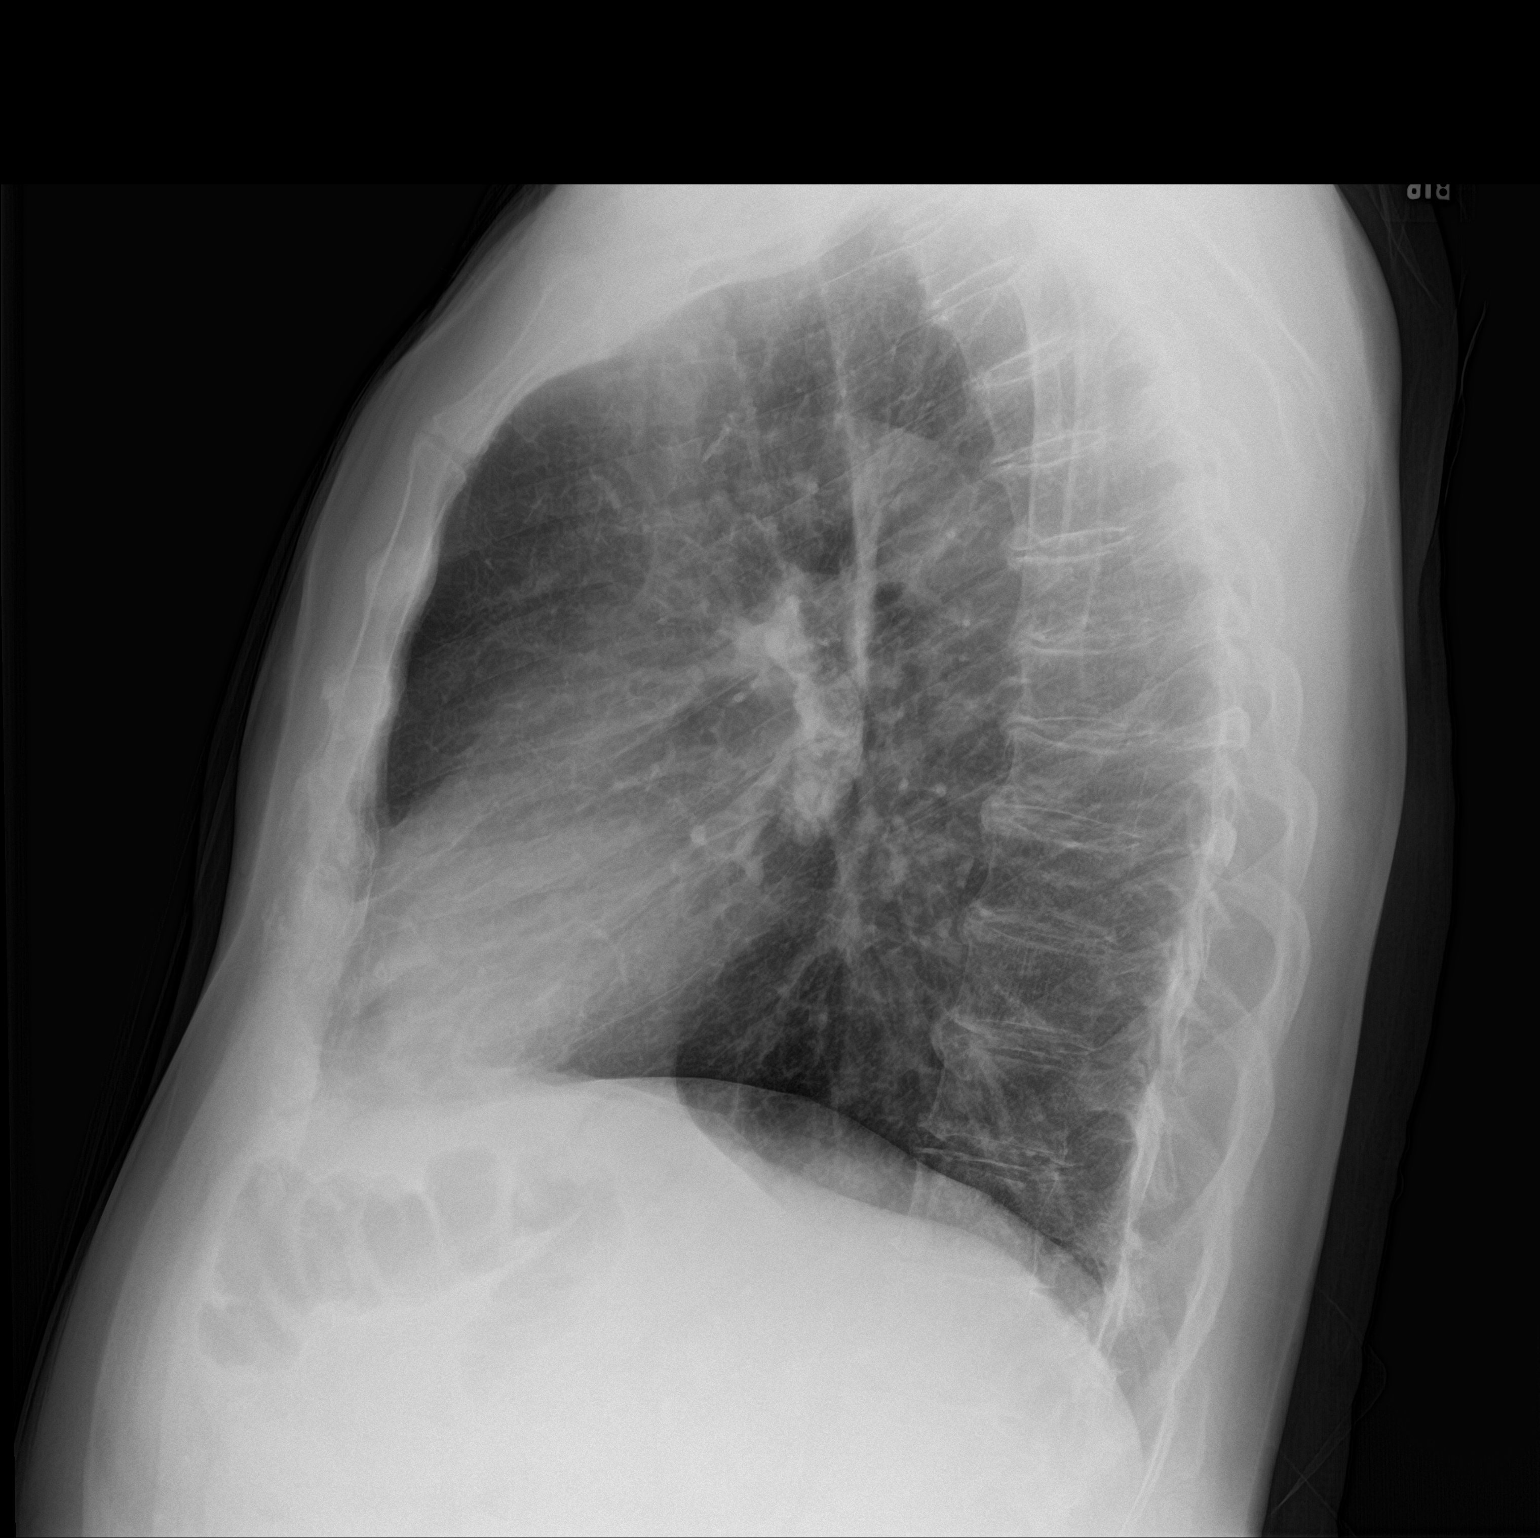

[2 of 2 positions shown; findings below may reference images not displayed]

FINDINGS: Hyperinflation with mild interstitial opacity suggesting bronchial
inflammation. No consolidation or effusion. Normal cardiomediastinal
silhouette with atherosclerosis. No pneumothorax.
IMPRESSION: Mild bronchitic changes.  No focal infiltrate is seen.

## 2017-10-30 ENCOUNTER — Other Ambulatory Visit: Payer: Self-pay | Admitting: Emergency Medicine

## 2017-10-30 DIAGNOSIS — G8929 Other chronic pain: Secondary | ICD-10-CM

## 2017-10-30 DIAGNOSIS — M545 Low back pain: Principal | ICD-10-CM

## 2017-10-30 MED ORDER — HYDROCODONE-ACETAMINOPHEN 10-325 MG PO TABS: 1.0000 | ORAL_TABLET | Freq: Three times a day (TID) | ORAL | 0 refills | Status: DC | PRN

## 2017-10-30 NOTE — Telephone Encounter (Signed)
Pt called in to follow up on refill request.   Pt says that he would like to come in for pick up today at 4:30pm.

## 2017-10-30 NOTE — Telephone Encounter (Signed)
Sent to provider for approval

## 2017-10-30 NOTE — Telephone Encounter (Signed)
NCCSR: last filled on 10/18- ok to fill today UDS and contract done in May- ok Last visit here last month  Filled rx for pick up, called pt to let him know he can pick up Will give an rx for 30 days as well

## 2017-12-27 ENCOUNTER — Telehealth: Payer: Self-pay | Admitting: Family Medicine

## 2017-12-27 DIAGNOSIS — G8929 Other chronic pain: Secondary | ICD-10-CM

## 2017-12-27 DIAGNOSIS — M545 Low back pain, unspecified: Secondary | ICD-10-CM

## 2017-12-27 MED ORDER — HYDROCODONE-ACETAMINOPHEN 10-325 MG PO TABS: 1.0000 | ORAL_TABLET | Freq: Three times a day (TID) | ORAL | 0 refills | Status: DC | PRN

## 2017-12-27 NOTE — Telephone Encounter (Signed)
Pt requesting refill on hydrocodone.

## 2017-12-27 NOTE — Telephone Encounter (Signed)
Copied from CRM [id number]. Topic: Quick Communication - Rx Refill/Question >> Dec 27, 2017  1:01 PM Crist InfanteHarrald, Kathy J wrote: Medication: HYDROcodone-acetaminophen (NORCO) 10-325 MG tablet  Preferred Pharmacy (with phone number or street name): [id number] [place], [place] - [address] [phone] (Phone) [phone] (Fax)

## 2018-01-02 ENCOUNTER — Telehealth: Payer: Self-pay | Admitting: Family Medicine

## 2018-01-02 DIAGNOSIS — F4321 Adjustment disorder with depressed mood: Secondary | ICD-10-CM

## 2018-01-02 DIAGNOSIS — F411 Generalized anxiety disorder: Secondary | ICD-10-CM

## 2018-01-02 NOTE — Telephone Encounter (Signed)
Copied from CRM [id number]. Topic: General - Other >> Jan 02, 2018  5:51 PM Raquel SarnaHayes, Teresa G wrote: Pt is needing a refill on Xanax  [facility] [address] - Phone: [phone] Fax: [phone]

## 2018-01-02 NOTE — Telephone Encounter (Signed)
Xanax refill. Last OV 10/04/17. Last refill on 10/04/17 with 180 tabs.

## 2018-01-03 MED ORDER — ALPRAZOLAM 1 MG PO TABS: 1.0000 mg | ORAL_TABLET | Freq: Two times a day (BID) | ORAL | 0 refills | Status: DC | PRN

## 2018-01-03 NOTE — Telephone Encounter (Signed)
Pt requesting refill on alprazolam  

## 2018-01-26 ENCOUNTER — Telehealth: Payer: Self-pay | Admitting: Family Medicine

## 2018-01-26 DIAGNOSIS — G8929 Other chronic pain: Secondary | ICD-10-CM

## 2018-01-26 DIAGNOSIS — M545 Low back pain: Principal | ICD-10-CM

## 2018-01-26 MED ORDER — HYDROCODONE-ACETAMINOPHEN 10-325 MG PO TABS: 1.0000 | ORAL_TABLET | Freq: Three times a day (TID) | ORAL | 0 refills | Status: DC | PRN

## 2018-01-26 NOTE — Telephone Encounter (Signed)
Copied from CRM [phone]. Topic: Quick Communication - Rx Refill/Question >> Jan 26, 2018  4:03 PM Laural BenesJohnson, Louisianahiquita C wrote: Medication:  HYDROcodone-acetaminophen (NORCO) 10-325 MG tablet    Has the patient contacted their pharmacy? No    (Agent: If no, request that the patient contact the pharmacy for the refill.)   Preferred Pharmacy (with phone number or street name): [id number] [place], [place] - [address]   Agent: Please be advised that RX refills may take up to 3 business days. We ask that you follow-up with your pharmacy.

## 2018-01-26 NOTE — Telephone Encounter (Signed)
UDS and contract UTD Last seen in October NCCSR: he is due for refill

## 2018-01-26 NOTE — Telephone Encounter (Signed)
Pt is requesting refill on hydrocodone

## 2018-02-14 NOTE — Progress Notes (Deleted)
[facility] [address], [address] [place], [place] [id number] [phone] [phone]  Date:  02/15/2018   Name:  Jonathan Harrington   DOB:  06/10/1956   MRN:  [id number]  PCP:  Pearline Cablesopland, Rhylee Nunn C, MD    Chief Complaint: No chief complaint on file.   History of Present Illness:  Jonathan Harrington is a 62 y.o. very pleasant male patient who presents with the following:  History of HTN, anxiety, chronic joint and back pain, diverticulitis s/p partial colectomy I last saw him in October UDS:  Done 5/18  NCCSR: reviewed on 3/6 No unexpected entries Hydrocodone 70 tabs 2/15 Xanax 180 tabs 1/23  If fasting would like to get a lipid panel today, he is also due to for basic labs  Lab Results  Component Value Date   PSA 0.84 08/27/2014   PSA 1.45 01/19/2012    Patient Active Problem List   Diagnosis Date Noted  . Essential hypertension 06/06/2016  . Smoker 05/12/2016  . Left inguinal hernia 05/23/2014  . Anxiety state, unspecified 09/17/2013  . Gout 01/17/2013  . DIVERTICULOSIS-COLON 08/13/2010  . DIVERTICULITIS, COLON 08/13/2010  . ABDOMINAL PAIN-LLQ 08/13/2010  . Nonspecific (abnormal) findings on radiological and other examination of body structure 08/13/2010  . PERSONAL HX COLONIC POLYPS 08/13/2010  . NONSPCIFC ABN FINDING RAD & OTH EXAM LUNG FIELD 08/13/2010    Past Medical History:  Diagnosis Date  . Anxiety   . Diverticulitis 2011   resulted in partial colectomy  . Essential hypertension 06/06/2016  . Full dentures   . Gout   . Hematuria   . Wears glasses     Past Surgical History:  Procedure Laterality Date  . COLON SURGERY  2011   partial colectomy  . COLONOSCOPY    . FINGER ARTHROPLASTY  1990   rt index fx  . HERNIA REPAIR    . INGUINAL HERNIA REPAIR Left 05/27/2014   Procedure: LEFT INGUINAL HERNIA REPAIR WITH MESH;  Surgeon: Wilmon ArmsMatthew K. Corliss Skainssuei, MD;  Location: [facility];  Service: General;   Laterality: Left;  . INSERTION OF MESH Left 05/27/2014   Procedure: INSERTION OF MESH;  Surgeon: Wilmon ArmsMatthew K. Corliss Skainssuei, MD;  Location: [facility];  Service: General;  Laterality: Left;    Social History   Tobacco Use  . Smoking status: Current Every Day Smoker    Packs/day: 1.50  . Smokeless tobacco: Never Used  Substance Use Topics  . Alcohol use: Yes    Alcohol/week: 12.6 oz    Types: 21 Cans of beer per week    Comment: about 3 beers daily- history of heavy liquor abuse  . Drug use: No    Family History  Problem Relation Age of Onset  . Alzheimer's disease Mother   . Cancer Sister     Allergies  Allergen Reactions  . Ibuprofen Swelling    Medication list has been reviewed and updated.  Current Outpatient Medications on File Prior to Visit  Medication Sig Dispense Refill  . allopurinol (ZYLOPRIM) 300 MG tablet TAKE 1 TABLET (300 MG TOTAL) BY MOUTH DAILY. (Patient not taking: Reported on 03/09/2017) 90 tablet 3  . ALPRAZolam (XANAX) 1 MG tablet Take 1 tablet (1 mg total) by mouth 2 (two) times daily as needed for anxiety. 180 tablet 0  . amLODipine (NORVASC) 5 MG tablet Take 1 tablet (5 mg total) by mouth daily. 90 tablet 3  . benzonatate (TESSALON) 100  MG capsule Take 1 capsule (100 mg total) by mouth 3 (three) times daily as needed for cough. 40 capsule 0  . doxycycline (VIBRAMYCIN) 100 MG capsule Take 1 capsule (100 mg total) by mouth 2 (two) times daily. 20 capsule 0  . FLUoxetine (PROZAC) 20 MG tablet TAKE 1 TABLET BY MOUTH EVERY DAY (Patient not taking: Reported on 07/24/2017) 30 tablet 2  . HYDROcodone-acetaminophen (NORCO) 10-325 MG tablet Take 1 tablet by mouth every 8 (eight) hours as needed. 70 tablet 0  . lisinopril (PRINIVIL,ZESTRIL) 40 MG tablet Take 1 tablet (40 mg total) by mouth daily. 90 tablet 3  . lisinopril (PRINIVIL,ZESTRIL) 40 MG tablet TAKE 1 TABLET BY MOUTH EVERY DAY 30 tablet 0  . Multiple Vitamins-Minerals (MULTIVITAMIN WITH MINERALS)  tablet Take 1 tablet by mouth daily. Reported on 03/21/2016    . rosuvastatin (CRESTOR) 10 MG tablet Take 1 tablet (10 mg total) by mouth daily. 90 tablet 3  . valACYclovir (VALTREX) 1000 MG tablet Take 1 tablet (1,000 mg total) by mouth 2 (two) times daily. (Patient not taking: Reported on 07/24/2017) 21 tablet 0  . varenicline (CHANTIX CONTINUING MONTH PAK) 1 MG tablet Take 1 tablet (1 mg total) by mouth 2 (two) times daily. 60 tablet 3   No current facility-administered medications on file prior to visit.     Review of Systems:  As per HPI- otherwise negative.   Physical Examination: There were no vitals filed for this visit. There were no vitals filed for this visit. There is no height or weight on file to calculate BMI. Ideal Body Weight:    GEN: WDWN, NAD, Non-toxic, A & O x 3 HEENT: Atraumatic, Normocephalic. Neck supple. No masses, No LAD. Ears and Nose: No external deformity. CV: RRR, No M/G/R. No JVD. No thrill. No extra heart sounds. PULM: CTA B, no wheezes, crackles, rhonchi. No retractions. No resp. distress. No accessory muscle use. ABD: S, NT, ND, +BS. No rebound. No HSM. EXTR: No c/c/e NEURO Normal gait.  PSYCH: Normally interactive. Conversant. Not depressed or anxious appearing.  Calm demeanor.    Assessment and Plan: ***  Signed Abbe Amsterdam, MD

## 2018-02-15 ENCOUNTER — Ambulatory Visit: Payer: BLUE CROSS/BLUE SHIELD | Admitting: Family Medicine

## 2018-02-15 ENCOUNTER — Emergency Department (HOSPITAL_BASED_OUTPATIENT_CLINIC_OR_DEPARTMENT_OTHER)
Admission: EM | Admit: 2018-02-15 | Discharge: 2018-02-15 | Disposition: A | Discharge status: Home / Self Care | Payer: BLUE CROSS/BLUE SHIELD | Attending: Emergency Medicine | Admitting: Emergency Medicine

## 2018-02-15 ENCOUNTER — Encounter (HOSPITAL_BASED_OUTPATIENT_CLINIC_OR_DEPARTMENT_OTHER): Payer: Self-pay | Admitting: Emergency Medicine

## 2018-02-15 ENCOUNTER — Encounter: Payer: Self-pay | Admitting: Family Medicine

## 2018-02-15 ENCOUNTER — Emergency Department (HOSPITAL_BASED_OUTPATIENT_CLINIC_OR_DEPARTMENT_OTHER): Payer: BLUE CROSS/BLUE SHIELD

## 2018-02-15 ENCOUNTER — Other Ambulatory Visit: Payer: Self-pay

## 2018-02-15 VITALS — BP 100/62 | HR 108 | Temp 98.1°F | Ht 72.0 in | Wt 176.4 lb

## 2018-02-15 DIAGNOSIS — R404 Transient alteration of awareness: Secondary | ICD-10-CM | POA: Diagnosis not present

## 2018-02-15 DIAGNOSIS — R112 Nausea with vomiting, unspecified: Secondary | ICD-10-CM | POA: Diagnosis not present

## 2018-02-15 DIAGNOSIS — R531 Weakness: Secondary | ICD-10-CM

## 2018-02-15 DIAGNOSIS — I10 Essential (primary) hypertension: Secondary | ICD-10-CM | POA: Insufficient documentation

## 2018-02-15 DIAGNOSIS — R109 Unspecified abdominal pain: Secondary | ICD-10-CM | POA: Insufficient documentation

## 2018-02-15 DIAGNOSIS — F17210 Nicotine dependence, cigarettes, uncomplicated: Secondary | ICD-10-CM | POA: Insufficient documentation

## 2018-02-15 DIAGNOSIS — Z79899 Other long term (current) drug therapy: Secondary | ICD-10-CM | POA: Insufficient documentation

## 2018-02-15 LAB — URINALYSIS, ROUTINE W REFLEX MICROSCOPIC
Bilirubin Urine: NEGATIVE
Glucose, UA: NEGATIVE mg/dL
Hgb urine dipstick: NEGATIVE
Ketones, ur: 15 mg/dL — AB
Nitrite: NEGATIVE
Protein, ur: NEGATIVE mg/dL
Specific Gravity, Urine: <1.005 — ABNORMAL LOW (ref 1.005–1.030)
pH: 7 (ref 5.0–8.0)

## 2018-02-15 LAB — COMPREHENSIVE METABOLIC PANEL
ALT: 27 U/L (ref 17–63)
AST: 41 U/L (ref 15–41)
Albumin: 4.3 g/dL (ref 3.5–5.0)
Alkaline Phosphatase: 44 U/L (ref 38–126)
Anion gap: 12 (ref 5–15)
BUN: 24 mg/dL — ABNORMAL HIGH (ref 6–20)
CO2: 23 mmol/L (ref 22–32)
Calcium: 9.3 mg/dL (ref 8.9–10.3)
Chloride: 102 mmol/L (ref 101–111)
Creatinine, Ser: 1.33 mg/dL — ABNORMAL HIGH (ref 0.61–1.24)
GFR calc Af Amer: >60 mL/min (ref >60)
GFR calc non Af Amer: 55 mL/min — ABNORMAL LOW (ref >60)
Glucose, Bld: 113 mg/dL — ABNORMAL HIGH (ref 65–99)
Potassium: 4.5 mmol/L (ref 3.5–5.1)
Sodium: 137 mmol/L (ref 135–145)
Total Bilirubin: 1.1 mg/dL (ref 0.3–1.2)
Total Protein: 8 g/dL (ref 6.5–8.1)

## 2018-02-15 LAB — LIPASE, BLOOD: Lipase: 36 U/L (ref 11–51)

## 2018-02-15 LAB — URINALYSIS, MICROSCOPIC (REFLEX)

## 2018-02-15 LAB — CBC
HCT: 42.8 % (ref 39.0–52.0)
Hemoglobin: 14.7 g/dL (ref 13.0–17.0)
MCH: 34.7 pg — ABNORMAL HIGH (ref 26.0–34.0)
MCHC: 34.3 g/dL (ref 30.0–36.0)
MCV: 100.9 fL — ABNORMAL HIGH (ref 78.0–100.0)
Platelets: 141 10*3/uL — ABNORMAL LOW (ref 150–400)
RBC: 4.24 MIL/uL (ref 4.22–5.81)
RDW: 12.8 % (ref 11.5–15.5)
WBC: 7.4 10*3/uL (ref 4.0–10.5)

## 2018-02-15 MED ORDER — ONDANSETRON 4 MG PO TBDP: 4.0000 mg | ORAL_TABLET | Freq: Three times a day (TID) | ORAL | 0 refills | Status: DC | PRN

## 2018-02-15 MED ORDER — PROMETHAZINE HCL 25 MG/ML IJ SOLN
12.5000 mg | Freq: Once | INTRAMUSCULAR | Status: AC
  Administered 2018-02-15: 12.5 mg via INTRAVENOUS
  Filled 2018-02-15: qty 1

## 2018-02-15 MED ORDER — IOPAMIDOL (ISOVUE-300) INJECTION 61%
100.0000 mL | Freq: Once | INTRAVENOUS | Status: AC | PRN
  Administered 2018-02-15: 100 mL via INTRAVENOUS

## 2018-02-15 MED ORDER — SODIUM CHLORIDE 0.9 % IV BOLUS (SEPSIS)
1000.0000 mL | Freq: Once | INTRAVENOUS | Status: AC
  Administered 2018-02-15: 1000 mL via INTRAVENOUS

## 2018-02-15 MED FILL — ONDANSETRON ODT 4 MG TABLET: 4 | 4 days supply | Qty: 12 | Fill #0

## 2018-02-15 NOTE — ED Triage Notes (Signed)
Pt sent from PCP, c/o vomiting and generalized weakness for 4 days with occasional abd pain.
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
## 2018-02-15 NOTE — ED Notes (Signed)
Patient transported to CT 

## 2018-02-22 NOTE — ED Provider Notes (Signed)
[facility] EMERGENCY DEPARTMENT Provider Note   CSN: [id number] Arrival date & time: 02/15/18  [id number]     History   Chief Complaint Chief Complaint  Patient presents with  . Emesis    HPI Jonathan Harrington is a 62 y.o. male.  HPI  62 year old male with abdominal pain.  Symptom onset 3-4 days ago.  Waxes and wanes.  No appreciable exacerbating relieving factors.  Feels generally weak.  Vomiting yesterday which has since resolved.  No urinary complaints.  Past Medical History:  Diagnosis Date  . Anxiety   . Diverticulitis 2011   resulted in partial colectomy  . Essential hypertension 06/06/2016  . Full dentures   . Gout   . Hematuria   . Wears glasses     Patient Active Problem List   Diagnosis Date Noted  . Essential hypertension 06/06/2016  . Smoker 05/12/2016  . Left inguinal hernia 05/23/2014  . Anxiety state, unspecified 09/17/2013  . Gout 01/17/2013  . DIVERTICULOSIS-COLON 08/13/2010  . DIVERTICULITIS, COLON 08/13/2010  . ABDOMINAL PAIN-LLQ 08/13/2010  . Nonspecific (abnormal) findings on radiological and other examination of body structure 08/13/2010  . PERSONAL HX COLONIC POLYPS 08/13/2010  . NONSPCIFC ABN FINDING RAD & OTH EXAM LUNG FIELD 08/13/2010    Past Surgical History:  Procedure Laterality Date  . COLON SURGERY  2011   partial colectomy  . COLONOSCOPY    . FINGER ARTHROPLASTY  1990   rt index fx  . HERNIA REPAIR    . INGUINAL HERNIA REPAIR Left 05/27/2014   Procedure: LEFT INGUINAL HERNIA REPAIR WITH MESH;  Surgeon: Wilmon ArmsMatthew K. Corliss Skainssuei, MD;  Location: [facility];  Service: General;  Laterality: Left;  . INSERTION OF MESH Left 05/27/2014   Procedure: INSERTION OF MESH;  Surgeon: Wilmon ArmsMatthew K. Corliss Skainssuei, MD;  Location: [facility];  Service: General;  Laterality: Left;       Home Medications    Prior to Admission medications   Medication Sig Start Date End Date Taking? Authorizing Provider  allopurinol (ZYLOPRIM)  300 MG tablet TAKE 1 TABLET (300 MG TOTAL) BY MOUTH DAILY. 06/10/16   [name], [name], MD  ALPRAZolam Prudy Feeler(XANAX) 1 MG tablet Take 1 tablet (1 mg total) by mouth 2 (two) times daily as needed for anxiety. 01/03/18   [name], [name], MD  amLODipine (NORVASC) 5 MG tablet Take 1 tablet (5 mg total) by mouth daily. 09/26/17   [name], [name], MD  benzonatate (TESSALON) 100 MG capsule Take 1 capsule (100 mg total) by mouth 3 (three) times daily as needed for cough. Patient not taking: Reported on 02/15/2018 10/04/17   [name], [name], MD  FLUoxetine (PROZAC) 20 MG tablet TAKE 1 TABLET BY MOUTH EVERY DAY 04/03/17   [name], [name], MD  HYDROcodone-acetaminophen (NORCO) 10-325 MG tablet Take 1 tablet by mouth every 8 (eight) hours as needed. 01/26/18   [name], [name], MD  lisinopril (PRINIVIL,ZESTRIL) 40 MG tablet Take 1 tablet (40 mg total) by mouth daily. 09/26/17   [name], [name], MD  Multiple Vitamins-Minerals (MULTIVITAMIN WITH MINERALS) tablet Take 1 tablet by mouth daily. Reported on 03/21/2016    [provider]  ondansetron (ZOFRAN ODT) 4 MG disintegrating tablet Take 1 tablet (4 mg total) by mouth every 8 (eight) hours as needed for nausea or vomiting. 02/15/18   Raeford RazorKohut, Safire Gordin, MD  rosuvastatin (CRESTOR) 10 MG tablet Take 1 tablet (10 mg total) by mouth daily. 09/26/17   [name], [name], MD  varenicline (CHANTIX CONTINUING MONTH PAK) 1 MG tablet Take 1 tablet (1 mg total) by mouth 2 (two) times daily. 09/27/17   [name], Gwenlyn Found, MD    Family History Family History  Problem Relation Age of Onset  . Alzheimer's disease Mother   . Cancer Sister     Social History Social History   Tobacco Use  . Smoking status: Current Every Day Smoker    Packs/day: 1.50  . Smokeless tobacco: Never Used  Substance Use Topics  . Alcohol use: Yes    Alcohol/week: 12.6 oz    Types: 21 Cans of beer per week    Comment: about 3 beers daily- history of heavy liquor abuse  . Drug  use: No     Allergies   Ibuprofen   Review of Systems Review of Systems  All systems reviewed and negative, other than as noted in HPI.  Physical Exam Updated Vital Signs BP (!) 142/77 (BP Location: Right Arm)   Pulse 98   Temp 98.4 F (36.9 C) (Oral)   Resp 16   Ht 6' (1.829 m)   Wt 79.8 kg (176 lb)   SpO2 100%   BMI 23.87 kg/m   Physical Exam  Constitutional: He appears well-developed and well-nourished. No distress.  HENT:  Head: Normocephalic and atraumatic.  Eyes: Conjunctivae are normal. Right eye exhibits no discharge. Left eye exhibits no discharge.  Neck: Neck supple.  Cardiovascular: Normal rate, regular rhythm and normal heart sounds. Exam reveals no gallop and no friction rub.  No murmur heard. Pulmonary/Chest: Effort normal and breath sounds normal. No respiratory distress.  Abdominal: Soft. He exhibits no distension. There is tenderness.  Musculoskeletal: He exhibits no edema or tenderness.  Neurological: He is alert.  Skin: Skin is warm and dry.  Psychiatric: He has a normal mood and affect. His behavior is normal. Thought content normal.  Nursing note and vitals reviewed.    ED Treatments / Results  Labs (all labs ordered are listed, but only abnormal results are displayed) Labs Reviewed  COMPREHENSIVE METABOLIC PANEL - Abnormal; Notable for the following components:      Result Value   Glucose, Bld 113 (*)    BUN 24 (*)    Creatinine, Ser 1.33 (*)    GFR calc non Af Amer 55 (*)    All other components within normal limits  CBC - Abnormal; Notable for the following components:   MCV 100.9 (*)    MCH 34.7 (*)    Platelets 141 (*)    All other components within normal limits  URINALYSIS, ROUTINE W REFLEX MICROSCOPIC - Abnormal; Notable for the following components:   Specific Gravity, Urine <1.005 (*)    Ketones, ur 15 (*)    Leukocytes, UA TRACE (*)    All other components within normal limits  URINALYSIS, MICROSCOPIC (REFLEX) -  Abnormal; Notable for the following components:   Bacteria, UA RARE (*)    Squamous Epithelial / LPF 0-5 (*)    All other components within normal limits  LIPASE, BLOOD    EKG  EKG Interpretation None       Radiology No results found.  Procedures Procedures (including critical care time)  Medications Ordered in ED Medications  sodium chloride 0.9 % bolus 1,000 mL (0 mLs Intravenous Stopped 02/15/18 1202)  promethazine (PHENERGAN) injection 12.5 mg (12.5 mg Intravenous Given 02/15/18 0956)  iopamidol (ISOVUE-300) 61 % injection 100 mL (100 mLs Intravenous Contrast Given 02/15/18 1203)     Initial Impression /  Assessment and Plan / ED Course  I have reviewed the triage vital signs and the nursing notes.  Pertinent labs & imaging results that were available during my care of the patient were reviewed by me and considered in my medical decision making (see chart for details).     61yM with abdominal pain and n/v. Only mild tenderness on exam. CT w/o acute abnormality. It has been determined that no acute conditions requiring further emergency intervention are present at this time. The patient has been advised of the diagnosis and plan. I reviewed any labs and imaging including any potential incidental findings. We have discussed signs and symptoms that warrant return to the ED and they are listed in the discharge instructions.    Final Clinical Impressions(s) / ED Diagnoses   Final diagnoses:  Abdominal pain, unspecified abdominal location  Nausea and vomiting, intractability of vomiting not specified, unspecified vomiting type    ED Discharge Orders        Ordered    ondansetron (ZOFRAN ODT) 4 MG disintegrating tablet  Every 8 hours PRN     02/15/18 1248       Raeford Razor, MD 02/22/18 1413

## 2018-03-05 ENCOUNTER — Other Ambulatory Visit: Payer: Self-pay | Admitting: Family Medicine

## 2018-03-05 DIAGNOSIS — G8929 Other chronic pain: Secondary | ICD-10-CM

## 2018-03-05 DIAGNOSIS — M545 Low back pain: Principal | ICD-10-CM

## 2018-03-05 NOTE — Telephone Encounter (Signed)
Copied from CRM [id number]. Topic: Quick Communication - Rx Refill/Question >> Mar 05, 2018 12:57 PM Jonathan Harrington, Jonathan Harrington wrote: Medication:HYDROcodone-acetaminophen [facility](NORCO) 10-325 MG tablet Has the patient contacted their pharmacy? Yes, patient in out of medicine (Agent: If no, request that the patient contact the pharmacy for the refill.) Preferred Pharmacy (with phone number or street name): [id number] [place] - [address] Agent: Please be advised that RX refills may take up to 3 business days. We ask that you follow-up with your pharmacy.

## 2018-03-06 MED ORDER — HYDROCODONE-ACETAMINOPHEN 10-325 MG PO TABS: 1.0000 | ORAL_TABLET | Freq: Three times a day (TID) | ORAL | 0 refills | Status: DC | PRN

## 2018-03-06 NOTE — Telephone Encounter (Signed)
Reviewed chart and NCCSR today- looks as expected.  Ok to refill but will need UDS at next visit Contract UTD UDS 5/18

## 2018-03-06 NOTE — Telephone Encounter (Signed)
Pt requesting refill on Hydrocodone

## 2018-03-06 NOTE — Telephone Encounter (Signed)
Norco refill request - controlled substance  LOV 10/04/17 with Jessica [name]  [facility] [address] [place], [place]  [address]

## 2018-03-29 ENCOUNTER — Other Ambulatory Visit: Payer: Self-pay | Admitting: Family Medicine

## 2018-03-29 DIAGNOSIS — F411 Generalized anxiety disorder: Secondary | ICD-10-CM

## 2018-03-29 DIAGNOSIS — F4321 Adjustment disorder with depressed mood: Secondary | ICD-10-CM

## 2018-03-29 NOTE — Telephone Encounter (Signed)
Request for Xanax refill.    LOV: 10/04/17  Last refill 01/03/18 #180  Dr. Patsy Lageropland   [facility] [address]

## 2018-03-29 NOTE — Telephone Encounter (Signed)
Copied from CRM [id number]. Topic: Quick Communication - See Telephone Encounter >> Mar 29, 2018  6:26 PM Trula SladeWalter, Linda F wrote: CRM for notification. See Telephone encounter for: 03/29/18. Patient would like a refill on his ALPRAZolam (XANAX) 1 MG tablet medication and sent to [facility] preferred [facility] [address]. Please delete any other pharmacy.

## 2018-04-02 MED ORDER — ALPRAZOLAM 1 MG PO TABS
1.0000 mg | ORAL_TABLET | Freq: Two times a day (BID) | ORAL | 0 refills | Status: DC | PRN
Start: 2018-04-02 — End: 2018-07-01

## 2018-04-02 NOTE — Telephone Encounter (Signed)
NCCSR:  03/06/2018  1  03/06/2018  Hydrocodone-Acetamin 10-325 Mg  70 23 Je Cop  [id number]  Nor ([id number])  0 30.43 MME Comm Ins  [place]  01/26/2018  1  01/26/2018  Hydrocodone-Acetamin 10-325 Mg  70 23 Je Cop  [id number]  Nor ([id number])  0 30.43 MME Comm Ins  [place]  01/03/2018  1  01/03/2018  Alprazolam 1 Mg Tablet  180 90 Je Cop  [id number]  Nor ([id number])  0 4.00 LME Comm Ins  [place]  12/27/2017  1  12/27/2017  Hydrocodone-Acetamin 10-325 Mg  70 30 Je Cop  [id number]  Nor (2372)  0 23.33 MME Comm Ins  [place]  11/29/2017  1  10/31/2017  Hydrocodone-Acetamin 10-325 Mg  70 23 Je Cop  [id number]  Nor (2372)  0 30.43 MME Comm Ins  [place]  10/31/2017  1  10/30/2017  Hydrocodone-Acetamin 10-325 Mg  70 23 Je Cop  [id number]  Nor (2372)  0 30.43 MME Comm Ins  [place]  10/06/2017  1  09/26/2017  Alprazolam 1 Mg Tablet  180 90 Je Cop  [id number]  Nor (2372)  0 4.00 LME     Ok to refill xanax, last done in January for 90 days. He is now due for a refill  Last seen her last month and sent to the ER

## 2018-04-09 ENCOUNTER — Telehealth: Payer: Self-pay | Admitting: Family Medicine

## 2018-04-09 DIAGNOSIS — M545 Low back pain, unspecified: Secondary | ICD-10-CM

## 2018-04-09 DIAGNOSIS — G8929 Other chronic pain: Secondary | ICD-10-CM

## 2018-04-09 MED ORDER — HYDROCODONE-ACETAMINOPHEN 10-325 MG PO TABS: 1.0000 | ORAL_TABLET | Freq: Three times a day (TID) | ORAL | 0 refills | Status: DC | PRN

## 2018-04-09 NOTE — Telephone Encounter (Signed)
Copied from CRM [id number]. Topic: Quick Communication - Rx Refill/Question >> Apr 09, 2018 10:11 AM Leafy Ro wrote: Medication:hydrocodone Has the patient contacted their pharmacy?yes (Agent: If no, request that the patient contact the pharmacy for the refill.) Preferred Pharmacy (with phone number or street name): [facility] [address] Agent: Please be advised that RX refills may take up to 3 business days. We ask that you follow-up with your pharmacy.

## 2018-04-09 NOTE — Telephone Encounter (Signed)
Refill request received for hydrocodone.

## 2018-04-09 NOTE — Telephone Encounter (Signed)
Here quite recently for a visit UDS: done. 5/18 Contract done NCCSR:  04/02/2018  1  04/02/2018  Alprazolam 1 Mg Tablet  180 90 Je Cop  [id number]  [place] ([id number])  0 4.00 LME Comm Ins  [place]  03/06/2018  1  03/06/2018  Hydrocodone-Acetamin 10-325 Mg  70 23 Je Cop  [id number]  Nor (2372)  0 30.43 MME Comm Ins  [place]  01/26/2018  1  01/26/2018  Hydrocodone-Acetamin 10-325 Mg  70 23 Je Cop  [id number]  Nor (2372)  0 30.43 MME Comm Ins  [place]  01/03/2018  1  01/03/2018  Alprazolam 1 Mg Tablet  180 90 Je Cop  [id number]  Nor (2372)  0 4.00 LME Comm Ins  [place]  12/27/2017  1  12/27/2017  Hydrocodone-Acetamin 10-325 Mg  70 30 Je Cop  [id number]  Nor (2372)  0 23.33 MME Comm Ins  [place]  11/29/2017  1  10/31/2017  Hydrocodone-Acetamin 10-325 Mg  70 23 Je Cop  [id number]  Nor (2372)  0

## 2018-05-03 ENCOUNTER — Other Ambulatory Visit: Payer: Self-pay | Admitting: Family Medicine

## 2018-05-03 DIAGNOSIS — G8929 Other chronic pain: Secondary | ICD-10-CM

## 2018-05-03 DIAGNOSIS — M545 Low back pain: Principal | ICD-10-CM

## 2018-05-03 NOTE — Telephone Encounter (Signed)
Hydrocodone-acetaminophen refill Last OV: 02/15/18 Dr. Patsy Lager Last Refill:04/09/18 #70 [facility] [address]

## 2018-05-03 NOTE — Telephone Encounter (Signed)
Copied from CRM #[id number]. Topic: Quick Communication - See Telephone Encounter >> May 03, 2018  6:42 PM Trula Slade wrote: CRM for notification. See Telephone encounter for: 05/03/18. Patient would like to have his HYDROcodone-acetaminophen (NORCO) 10-325 MG tablet medication refilled and sent to [facility] [address]

## 2018-05-04 MED ORDER — HYDROCODONE-ACETAMINOPHEN 10-325 MG PO TABS: 1.0000 | ORAL_TABLET | Freq: Three times a day (TID) | ORAL | 0 refills | Status: DC | PRN

## 2018-05-04 NOTE — Telephone Encounter (Signed)
Refill request for Norco 10/325mg.

## 2018-06-01 DIAGNOSIS — H524 Presbyopia: Secondary | ICD-10-CM | POA: Diagnosis not present

## 2018-06-04 ENCOUNTER — Telehealth: Payer: Self-pay | Admitting: Family Medicine

## 2018-06-04 DIAGNOSIS — G8929 Other chronic pain: Secondary | ICD-10-CM

## 2018-06-04 DIAGNOSIS — M545 Low back pain: Principal | ICD-10-CM

## 2018-06-04 MED ORDER — HYDROCODONE-ACETAMINOPHEN 10-325 MG PO TABS: 1.0000 | ORAL_TABLET | Freq: Three times a day (TID) | ORAL | 0 refills | Status: DC | PRN

## 2018-06-04 NOTE — Telephone Encounter (Signed)
Copied from CRM [id number]. Topic: Quick Communication - Rx Refill/Question >> Jun 04, 2018 12:55 PM Gloriann LoanPayne, Muhanad Torosyan L wrote: Medication: HYDROcodone-acetaminophen (NORCO) 10-325 MG tablet   pt would like to get this filled early he is going on a trip on Friday 05-12-26-19  Has the patient contacted their pharmacy? yes (Agent: If no, request that the patient contact the pharmacy for the refill.) (Agent: If yes, when and what did the pharmacy advise?)  pharmacy told him to call provider always for this medicine  Preferred Pharmacy (with phone number or street name): [id number] [place], [place] - [address] [phone] (Phone) [phone] (Fax)      Agent: Please be advised that RX refills may take up to 3 business days. We ask that you follow-up with your pharmacy.

## 2018-06-04 NOTE — Telephone Encounter (Signed)
Reviewed NCCSR- ok to refill, however he is overdue for a visit with me to discuss meds/ UDS Called pt and asked him to do so, he will schedule to see me asap

## 2018-06-04 NOTE — Telephone Encounter (Signed)
Refill request for Hydrocodone.  

## 2018-06-04 NOTE — Telephone Encounter (Signed)
Refill request hydrocodone 10 - 325 mg   LOV 02/15/2018 Dr [name]   Last filled  05/04/2018   70 tabs 1 tab q 8 hr  Prn Dr Patsy Lageropland   [facility]acy [facility] [address]
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
## 2018-06-20 ENCOUNTER — Encounter: Payer: Self-pay | Admitting: Family Medicine

## 2018-06-20 ENCOUNTER — Ambulatory Visit: Payer: BLUE CROSS/BLUE SHIELD | Admitting: Family Medicine

## 2018-06-20 ENCOUNTER — Telehealth: Payer: Self-pay

## 2018-06-20 VITALS — BP 168/78 | HR 83 | Temp 98.1°F | Resp 16 | Ht 72.0 in | Wt 176.0 lb

## 2018-06-20 DIAGNOSIS — R918 Other nonspecific abnormal finding of lung field: Secondary | ICD-10-CM

## 2018-06-20 DIAGNOSIS — I10 Essential (primary) hypertension: Secondary | ICD-10-CM

## 2018-06-20 DIAGNOSIS — R350 Frequency of micturition: Secondary | ICD-10-CM

## 2018-06-20 DIAGNOSIS — S8002XA Contusion of left knee, initial encounter: Secondary | ICD-10-CM

## 2018-06-20 DIAGNOSIS — G8929 Other chronic pain: Secondary | ICD-10-CM | POA: Diagnosis not present

## 2018-06-20 DIAGNOSIS — M545 Low back pain: Secondary | ICD-10-CM | POA: Diagnosis not present

## 2018-06-20 DIAGNOSIS — Z125 Encounter for screening for malignant neoplasm of prostate: Secondary | ICD-10-CM | POA: Diagnosis not present

## 2018-06-20 DIAGNOSIS — E785 Hyperlipidemia, unspecified: Secondary | ICD-10-CM | POA: Diagnosis not present

## 2018-06-20 DIAGNOSIS — M87051 Idiopathic aseptic necrosis of right femur: Secondary | ICD-10-CM

## 2018-06-20 DIAGNOSIS — F17200 Nicotine dependence, unspecified, uncomplicated: Secondary | ICD-10-CM | POA: Diagnosis not present

## 2018-06-20 DIAGNOSIS — M87052 Idiopathic aseptic necrosis of left femur: Secondary | ICD-10-CM

## 2018-06-20 DIAGNOSIS — R198 Other specified symptoms and signs involving the digestive system and abdomen: Secondary | ICD-10-CM | POA: Diagnosis not present

## 2018-06-20 LAB — POCT URINALYSIS DIP (MANUAL ENTRY)
BILIRUBIN UA: NEGATIVE mg/dL
Bilirubin, UA: NEGATIVE
Blood, UA: NEGATIVE
Glucose, UA: NEGATIVE mg/dL
Leukocytes, UA: NEGATIVE
Nitrite, UA: NEGATIVE
PH UA: 6 (ref 5.0–8.0)
SPEC GRAV UA: 1.025 (ref 1.010–1.025)
Urobilinogen, UA: 0.2 E.U./dL

## 2018-06-20 MED ORDER — TAMSULOSIN HCL 0.4 MG PO CAPS: 0.4000 mg | ORAL_CAPSULE | Freq: Every day | ORAL | 6 refills | Status: DC

## 2018-06-20 MED ORDER — AMLODIPINE BESYLATE 10 MG PO TABS: 10.0000 mg | ORAL_TABLET | Freq: Every day | ORAL | 3 refills | Status: DC

## 2018-06-20 NOTE — Patient Instructions (Addendum)
We will get your urine drug screen today. I will also look for any infection that might be causing your urinary symptoms I am not able to fill your xanax early. Please do not take more medication than you are prescribed for this reason. You will need to ration what you have left until your next refill is due   I am going to refer you to gastroenterology as you are due for colon cancer screening and your are having a change in bowel pattern  We ae going to try flomax for your urinary symptoms, and I will refer you to Gastroenterology for colon cancer screening and also to discuss your bowel change   Your BP is quite high today.  We are going to increase your amlodipine to 10 mg a day, continue your lisinopril as well   Please see me in 3 months for a recheck visit

## 2018-06-20 NOTE — Telephone Encounter (Signed)
Message given to Dr. Patsy Lageropland Copied from CRM [id number]. Topic: Quick Communication - Patient Running Late >> Jun 20, 2018 12:17 PM Gean BirchwoodWilliams-Neal, Sade R wrote: Patient called and is running 7 minutes late.

## 2018-06-21 ENCOUNTER — Telehealth: Payer: Self-pay | Admitting: Family Medicine

## 2018-06-21 LAB — URINE CULTURE
MICRO NUMBER: 90817295
SPECIMEN QUALITY:: ADEQUATE

## 2018-06-21 LAB — PSA: PSA: 0.99 ng/mL (ref 0.10–4.00)

## 2018-06-21 NOTE — Telephone Encounter (Signed)
Copied from CRM [id number]. Topic: Inquiry >> Jun 21, 2018 12:57 PM Stephannie LiSimmons, Odalys Win L, NT wrote: Reason for CRM: Patient called and said he spoke with Dr Patsy Lageropland last night concerning his results he said he did not understand and would like for her to call him back to clarify please call [phone]

## 2018-06-23 LAB — PAIN MGMT, PROFILE 8 W/CONF, U
6 Acetylmorphine: NEGATIVE ng/mL (ref <10)
ALCOHOL METABOLITES: POSITIVE ng/mL — AB (ref <500)
ALPHAHYDROXYALPRAZOLAM: 353 ng/mL — AB (ref <25)
ALPHAHYDROXYTRIAZOLAM: NEGATIVE ng/mL (ref <50)
Alphahydroxymidazolam: NEGATIVE ng/mL (ref <50)
Aminoclonazepam: NEGATIVE ng/mL (ref <25)
Amphetamines: NEGATIVE ng/mL (ref <500)
Benzodiazepines: POSITIVE ng/mL — AB (ref <100)
Buprenorphine, Urine: NEGATIVE ng/mL (ref <5)
CREATININE: 130.1 mg/dL
Cocaine Metabolite: NEGATIVE ng/mL (ref <150)
Codeine: NEGATIVE ng/mL (ref <50)
ETHYL GLUCURONIDE (ETG): 81593 ng/mL — AB (ref <500)
ETHYL SULFATE (ETS): 11460 ng/mL — AB (ref <100)
Hydrocodone: 666 ng/mL — ABNORMAL HIGH (ref <50)
Hydromorphone: 271 ng/mL — ABNORMAL HIGH (ref <50)
Hydroxyethylflurazepam: NEGATIVE ng/mL (ref <50)
Lorazepam: NEGATIVE ng/mL (ref <50)
MARIJUANA METABOLITE: NEGATIVE ng/mL (ref <20)
MDMA: NEGATIVE ng/mL (ref <500)
MORPHINE: NEGATIVE ng/mL (ref <50)
NORDIAZEPAM: NEGATIVE ng/mL (ref <50)
NORHYDROCODONE: 896 ng/mL — AB (ref <50)
OXAZEPAM: NEGATIVE ng/mL (ref <50)
OXIDANT: NEGATIVE ug/mL (ref <200)
Opiates: POSITIVE ng/mL — AB (ref <100)
Oxycodone: NEGATIVE ng/mL (ref <100)
PH: 6.8 (ref 4.5–9.0)
Temazepam: NEGATIVE ng/mL (ref <50)

## 2018-06-23 NOTE — Telephone Encounter (Signed)
Called pt back and let him know that we need to update his CT chest.  However also discussed our phone call the other day. Pt states that he had 3 drinks, "the same as always" prior to our conversation and that he was "half- asleep" as he was already going to bed for the night.  If I recall our phone conversation took place at approx 6:30 pm.  Given his history of alcohol use I suspect that he had more than 3 drinks and that he was intoxicated, not asleep.  Macie Burowsdvised Srihaan that I am worried about his safety, and will need to taper him off both hydrocodone and alprazolam as I am concerned about his risk of overdose and death as he is mixing sedating medications with alcohol.  When he is due for his next RF will decrease his xanax to 30/ month and hydrocodone to 60 per month nad continue to decrease from there.  He states understanding

## 2018-06-25 ENCOUNTER — Encounter (HOSPITAL_BASED_OUTPATIENT_CLINIC_OR_DEPARTMENT_OTHER): Payer: Self-pay

## 2018-06-25 ENCOUNTER — Ambulatory Visit (HOSPITAL_BASED_OUTPATIENT_CLINIC_OR_DEPARTMENT_OTHER): Payer: BLUE CROSS/BLUE SHIELD

## 2018-07-01 ENCOUNTER — Telehealth: Payer: Self-pay | Admitting: Family Medicine

## 2018-07-01 DIAGNOSIS — G8929 Other chronic pain: Secondary | ICD-10-CM

## 2018-07-01 DIAGNOSIS — R911 Solitary pulmonary nodule: Secondary | ICD-10-CM

## 2018-07-01 DIAGNOSIS — R918 Other nonspecific abnormal finding of lung field: Secondary | ICD-10-CM

## 2018-07-01 DIAGNOSIS — M545 Low back pain, unspecified: Secondary | ICD-10-CM

## 2018-07-01 DIAGNOSIS — F411 Generalized anxiety disorder: Secondary | ICD-10-CM

## 2018-07-01 DIAGNOSIS — F4321 Adjustment disorder with depressed mood: Secondary | ICD-10-CM

## 2018-07-01 MED ORDER — ALPRAZOLAM 0.5 MG PO TABS: 0.5000 mg | ORAL_TABLET | Freq: Two times a day (BID) | ORAL | 0 refills | Status: DC | PRN

## 2018-07-01 MED ORDER — HYDROCODONE-ACETAMINOPHEN 10-325 MG PO TABS: 1.0000 | ORAL_TABLET | Freq: Three times a day (TID) | ORAL | 0 refills | Status: DC | PRN

## 2018-07-01 NOTE — Telephone Encounter (Signed)
Called patient and had a long talk with him.  Will change CT chest location to [facility] for cost savings   Also, we discussed his drinking. His recent UDS was positive for alcohol He reports that he has not had alcohol in 2-3 days and is really planning on quitting for good.  However, we discussed that quitting alcohol is often difficult and that relapses are common.  As such, we need to wean him off his alprazolam at least.  Would like to stop hydrocodone as well but he feels that his joint pain is so severe in his knees that he would not be able to function  NCCSR:  06/04/2018  1  06/04/2018  Hydrocodone-Acetamin 10-325 Mg  70 23 Je Cop  [id number]  Nor (2372)  1/1 30.43 MME Comm Ins  [place]  05/04/2018  1  05/04/2018  Hydrocodone-Acetamin 10-325 Mg  70 23 Je Cop  [id number]  Nor (2372)  1/1 30.43 MME Comm Ins  [place]  04/09/2018  1  04/09/2018  Hydrocodone-Acetamin 10-325 Mg  70 23 Je Cop  [id number]  [place] (2372)  1/1 30.43 MME Comm Ins  [place]  04/02/2018  1  04/02/2018  Alprazolam 1 Mg Tablet  180 90 Je Cop  [id number]  [place] (2372)  1/1      He is due for a refill of his meds soon Will refill hydrocodone Will decrease dose of alprazolam to 0.5 BID and continue [facility]

## 2018-07-01 NOTE — Telephone Encounter (Signed)
-----   Message from Jonathan Harrington sent at 06/25/2018  5:12 PM EDT ----- Regarding: Patient Appointment Hello Dr. Patsy Harrington,  [name] cancelled his appointment for his CT today due to the cost, he advised he would like you to call him to discuss if this is truly necessary for him to get done. I did recommenced [facility] and also [facility] to the patient as they are a bit more cost efficient than us and he did advised understanding and would call them to get prices. He speaks very highly of you and would like your recommendation on if any of this is necessary though before he proceeds and has to pay any amount out of pocket.   Thank you!

## 2018-07-16 ENCOUNTER — Ambulatory Visit
Admission: RE | Admit: 2018-07-16 | Discharge: 2018-07-16 | Disposition: A | Discharge status: Home / Self Care | Payer: BLUE CROSS/BLUE SHIELD | Source: Ambulatory Visit | Attending: Family Medicine | Admitting: Family Medicine

## 2018-07-16 DIAGNOSIS — R918 Other nonspecific abnormal finding of lung field: Secondary | ICD-10-CM | POA: Diagnosis not present

## 2018-07-16 DIAGNOSIS — R911 Solitary pulmonary nodule: Secondary | ICD-10-CM

## 2018-07-17 ENCOUNTER — Encounter: Payer: Self-pay | Admitting: Family Medicine

## 2018-07-28 ENCOUNTER — Other Ambulatory Visit: Payer: Self-pay | Admitting: Family Medicine

## 2018-07-28 DIAGNOSIS — F411 Generalized anxiety disorder: Secondary | ICD-10-CM

## 2018-07-28 DIAGNOSIS — F4321 Adjustment disorder with depressed mood: Secondary | ICD-10-CM

## 2018-07-30 ENCOUNTER — Other Ambulatory Visit: Payer: Self-pay | Admitting: Family Medicine

## 2018-07-30 DIAGNOSIS — M1000 Idiopathic gout, unspecified site: Secondary | ICD-10-CM

## 2018-07-30 DIAGNOSIS — M545 Low back pain: Secondary | ICD-10-CM

## 2018-07-30 DIAGNOSIS — G8929 Other chronic pain: Secondary | ICD-10-CM

## 2018-07-30 NOTE — Telephone Encounter (Signed)
Requesting:alprazolam Contract: none, needs csc UDS:06/20/18 Last Visit:02/15/18 Next Visit:none Last Refill:07/01/18  Please Advise

## 2018-07-30 NOTE — Telephone Encounter (Signed)
LOV  06/20/18 LR of hydrocodone-acetaminophen 10-325 mg tab 07/01/18 for 70 tabs And request to restart allopurinol.    Dr. Patsy Lageropland

## 2018-07-30 NOTE — Telephone Encounter (Signed)
Copied from CRM [id number]. Topic: Quick Communication - See Telephone Encounter >> Jul 30, 2018 10:11 AM Jolayne Hainesaylor, Brittany L wrote:   HYDROcodone-acetaminophen (NORCO) 10-325 MG tablet &  he also wants to get start taking his allopurinol (ZYLOPRIM) 300 MG tablet [[id number]][[id number]]  DISCONTINUED again for his gout. He stopped taking but has gotten it twice since then.  [id number]- [place], [place] - [address] [place]  [phone]

## 2018-07-31 MED ORDER — ALLOPURINOL 300 MG PO TABS: 300.0000 mg | ORAL_TABLET | Freq: Every day | ORAL | 6 refills | Status: DC

## 2018-07-31 MED ORDER — HYDROCODONE-ACETAMINOPHEN 7.5-325 MG PO TABS: 1.0000 | ORAL_TABLET | Freq: Three times a day (TID) | ORAL | 0 refills | Status: DC | PRN

## 2018-07-31 MED ORDER — ALPRAZOLAM 0.25 MG PO TABS: 0.2500 mg | ORAL_TABLET | Freq: Two times a day (BID) | ORAL | 0 refills | Status: DC | PRN

## 2018-07-31 NOTE — Telephone Encounter (Signed)
Requesting:Hydrocodone-acetaminophen Contract:04/12/17 UDS:06/20/18 Last Visit:06/20/18 Next Visit:none Last Refill:07/01/18  Please Advise  Patient would also like to restart allopurinol.

## 2018-07-31 NOTE — Telephone Encounter (Signed)
Continuing to taper off xanax I decreased his dose from 0.5 BID to 0.25 BID on this fill  Meds ordered this encounter  Medications  . ALPRAZolam (XANAX) 0.25 MG tablet    Sig: Take 1 tablet (0.25 mg total) by mouth 2 (two) times daily as needed for anxiety.    Dispense:  60 tablet    Refill:  0

## 2018-07-31 NOTE — Telephone Encounter (Signed)
As per notes from last month, we are tapering his xanax and his hydrocodone with goal of DC.  His last UDS was positive for alcohol and he seemed quite intoxicated when I spoke with him on the phone   Called pt back and let him know that we need to update his CT chest.  However also discussed our phone call the other day. Pt states that he had 3 drinks, "the same as always" prior to our conversation and that he was "half- asleep" as he was already going to bed for the night.  If I recall our phone conversation took place at approx 6:30 pm.  Given his history of alcohol use I suspect that he had more than 3 drinks and that he was intoxicated, not asleep.  Jonathan Harrington Jonathan Harrington that I am worried about his safety, and will need to taper him off both hydrocodone and alprazolam as I am concerned about his risk of overdose and death as he is mixing sedating medications with alcohol.  When he is due for his next RF will decrease his xanax to 30/ month and hydrocodone to 60 per month nad continue to decrease from there.  He states understanding   I decreased his xanax from 1mg  to 0.5 to 0.25 and will drop his norco to the 7.5mg  strength  NCCSR:  07/01/2018  1  07/01/2018  Hydrocodone-Acetamin 10-325 Mg  70.00 23 Je Cop  [id number]  [place] (2372)  1/1 30.43 MME Comm Ins  [place]  07/01/2018  1  07/01/2018  Alprazolam 0.5 Mg Tablet  60.00 30 Je Cop  [id number]  [place] (2372)  1/1 2.00 LME Comm Ins  [place]  06/04/2018  1  06/04/2018  Hydrocodone-Acetamin 10-325 Mg  70.00 23 Je Cop  [id number]  Nor (2372)  1/1 30.43 MME Comm Ins  [place]  05/04/2018  1  05/04/2018  Hydrocodone-Acetamin 10-325 Mg  70.00 23 Je Cop  [id number]  Nor (2372)  1/1 30.43 MME Comm Ins  [place]  04/09/2018  1  04/09/2018  Hydrocodone-Acetamin 10-325 Mg  70.00 23 Je Cop  [id number]  Nor (2372)  1/1 30.43 MME Comm Ins  [place]  04/02/2018  1  04/02/2018  Alprazolam 1 Mg Tablet  180.00 90 Je Cop  [id number]  Nor (2372)  1/1

## 2018-08-01 ENCOUNTER — Ambulatory Visit (HOSPITAL_BASED_OUTPATIENT_CLINIC_OR_DEPARTMENT_OTHER)
Admission: RE | Admit: 2018-08-01 | Discharge: 2018-08-01 | Disposition: A | Discharge status: Home / Self Care | Payer: BLUE CROSS/BLUE SHIELD | Source: Ambulatory Visit | Attending: Family Medicine | Admitting: Family Medicine

## 2018-08-01 ENCOUNTER — Encounter: Payer: Self-pay | Admitting: Family Medicine

## 2018-08-01 ENCOUNTER — Ambulatory Visit: Payer: BLUE CROSS/BLUE SHIELD | Admitting: Family Medicine

## 2018-08-01 VITALS — BP 136/80 | HR 101 | Temp 98.3°F | Resp 16 | Ht 72.0 in | Wt 180.0 lb

## 2018-08-01 DIAGNOSIS — M79671 Pain in right foot: Secondary | ICD-10-CM

## 2018-08-01 DIAGNOSIS — M1A0710 Idiopathic chronic gout, right ankle and foot, without tophus (tophi): Secondary | ICD-10-CM

## 2018-08-01 DIAGNOSIS — E785 Hyperlipidemia, unspecified: Secondary | ICD-10-CM | POA: Diagnosis not present

## 2018-08-01 DIAGNOSIS — Z1211 Encounter for screening for malignant neoplasm of colon: Secondary | ICD-10-CM

## 2018-08-01 LAB — URIC ACID: URIC ACID, SERUM: 5.2 mg/dL (ref 4.0–7.8)

## 2018-08-01 LAB — BASIC METABOLIC PANEL
BUN: 14 mg/dL (ref 6–23)
CALCIUM: 9.6 mg/dL (ref 8.4–10.5)
CO2: 24 meq/L (ref 19–32)
CREATININE: 1.12 mg/dL (ref 0.40–1.50)
Chloride: 104 mEq/L (ref 96–112)
GFR: 70.07 mL/min (ref >60.00)
GLUCOSE: 92 mg/dL (ref 70–99)
Potassium: 4.2 mEq/L (ref 3.5–5.1)
Sodium: 137 mEq/L (ref 135–145)

## 2018-08-01 MED ORDER — ROSUVASTATIN CALCIUM 10 MG PO TABS: 10.0000 mg | ORAL_TABLET | Freq: Every day | ORAL | 3 refills | Status: DC

## 2018-08-01 MED ORDER — COLCHICINE 0.6 MG PO TABS: ORAL_TABLET | ORAL | 0 refills | Status: DC

## 2018-08-01 MED ORDER — COLCHICINE 0.6 MG PO CAPS: ORAL_CAPSULE | ORAL | 20 refills | Status: DC

## 2018-08-01 NOTE — Progress Notes (Addendum)
[facility] [address], [address] [place], [place] [address] [phone]  Date:  08/01/2018   Name:  Jonathan Harrington   DOB:  1956/03/13   MRN:  [id number]  PCP:  Pearline Cables, MD    Chief Complaint: Foot Numbness (right foot numbness in the morning,right foot pain, pain to touch, one month, no known injury)   History of Present Illness:  Jonathan Harrington is a 62 y.o. very pleasant male patient who presents with the following:  History of gout- also HTN, smoking, alcohol abuse, anxiety and chronic pain He stopped the allopurinol a year ago- he is not quite sure why he stopped using this. He has requested to restart this med yesterday and I did refill it, but walked in today due to foot pain. Fortunately I had a cancellation and was able to see him.  A month ago he noted that his right great toe was "sore," and that even having his blanket touch the toe was painful. It has been this way for a month or so,  He notes that wearing bedroom slippers can be painful and he is only able to wear one pair of shoes to work  On Saturday (today is Wednesday) one of his room-mates gave him some probenecid 500 mg that they had on hand, he is taking 500 mg BID However he does not think this is helping him at all.   I did rx allopurinol for him again yesterday but he has not yet started back on it He is not aware of any injury He does feel like his feet are numb in the am, gets better as the day goes on  He relates that he has stopped drinking liquor and is now drinking "just one beer a day" We are still tapering of his xanax and pain medication due to my concerns about his alcohol use- he understands that I will continue to get him off these meds although I am certainly glad that he reports drinking less    Patient Active Problem List   Diagnosis Date Noted  . Essential hypertension 06/06/2016  . Smoker 05/12/2016  . Left inguinal hernia  05/23/2014  . Anxiety state, unspecified 09/17/2013  . Gout 01/17/2013  . DIVERTICULOSIS-COLON 08/13/2010  . DIVERTICULITIS, COLON 08/13/2010  . ABDOMINAL PAIN-LLQ 08/13/2010  . Nonspecific (abnormal) findings on radiological and other examination of body structure 08/13/2010  . PERSONAL HX COLONIC POLYPS 08/13/2010  . NONSPCIFC ABN FINDING RAD & OTH EXAM LUNG FIELD 08/13/2010    Past Medical History:  Diagnosis Date  . Anxiety   . Diverticulitis 2011   resulted in partial colectomy  . Essential hypertension 06/06/2016  . Full dentures   . Gout   . Hematuria   . Wears glasses     Past Surgical History:  Procedure Laterality Date  . COLON SURGERY  2011   partial colectomy  . COLONOSCOPY    . FINGER ARTHROPLASTY  1990   rt index fx  . HERNIA REPAIR    . INGUINAL HERNIA REPAIR Left 05/27/2014   Procedure: LEFT INGUINAL HERNIA REPAIR WITH MESH;  Surgeon: Wilmon Arms. Corliss Skains, MD;  Location: [facility];  Service: General;  Laterality: Left;  . INSERTION OF MESH Left 05/27/2014   Procedure: INSERTION OF MESH;  Surgeon: Wilmon Arms. Corliss Skains, MD;  Location: [facility];  Service: General;  Laterality: Left;    Social History  Tobacco Use  . Smoking status: Current Every Day Smoker    Packs/day: 1.50  . Smokeless tobacco: Never Used  Substance Use Topics  . Alcohol use: Yes    Alcohol/week: 21.0 standard drinks    Types: 21 Cans of beer per week    Comment: about 3 beers daily- history of heavy liquor abuse  . Drug use: No    Family History  Problem Relation Age of Onset  . Alzheimer's disease Mother   . Cancer Sister     Allergies  Allergen Reactions  . Ibuprofen Swelling    Medication list has been reviewed and updated.  Current Outpatient Medications on File Prior to Visit  Medication Sig Dispense Refill  . allopurinol (ZYLOPRIM) 300 MG tablet Take 1 tablet (300 mg total) by mouth daily. 30 tablet 6  . ALPRAZolam (XANAX) 0.25 MG tablet  Take 1 tablet (0.25 mg total) by mouth 2 (two) times daily as needed for anxiety. 60 tablet 0  . amLODipine (NORVASC) 10 MG tablet Take 1 tablet (10 mg total) by mouth daily. 90 tablet 3  . HYDROcodone-acetaminophen (NORCO) 7.5-325 MG tablet Take 1 tablet by mouth every 8 (eight) hours as needed for moderate pain. 70 tablet 0  . lisinopril (PRINIVIL,ZESTRIL) 40 MG tablet Take 1 tablet (40 mg total) by mouth daily. 90 tablet 3  . Multiple Vitamins-Minerals (MULTIVITAMIN WITH MINERALS) tablet Take 1 tablet by mouth daily. Reported on 03/21/2016    . tamsulosin (FLOMAX) 0.4 MG CAPS capsule Take 1 capsule (0.4 mg total) by mouth daily. 30 capsule 6   No current facility-administered medications on file prior to visit.     Review of Systems:  As per HPI- otherwise negative. No fever or chills No rash No known injury to the toe or foot He also ran out of crestor   Physical Examination: Vitals:   08/01/18 0815  BP: 136/80  Pulse: (!) 101  Resp: 16  Temp: 98.3 F (36.8 C)  SpO2: 97%   Vitals:   08/01/18 0815  Weight: 180 lb (81.6 kg)  Height: 6' (1.829 m)   Body mass index is 24.41 kg/m. Ideal Body Weight: Weight in (lb) to have BMI = 25: 183.9  GEN: WDWN, NAD, Non-toxic, A & O x 3, looks well, normal weight HEENT: Atraumatic, Normocephalic. Neck supple. No masses, No LAD. Ears and Nose: No external deformity. CV: RRR, No M/G/R. No JVD. No thrill. No extra heart sounds. PULM: CTA B, no wheezes, crackles, rhonchi. No retractions. No resp. distress. No accessory muscle use. EXTR: No c/c/e NEURO Normal gait.  PSYCH: Normally interactive. Conversant. Not depressed or anxious appearing.  Calm demeanor.  Right foot: mild warmth about he 1st MTP.  He notes tenderness with movement at this joint but it does not seem severe, I am able to move the joint quite a bit without any sign of pain from him  Normal pulse of the foot No redness or wound He is not able to perceive monofilament at  any sites on the foot    Assessment and Plan: Chronic idiopathic gout involving toe of right foot without tophus - Plan: Uric acid, colchicine 0.6 MG tablet, Basic metabolic panel  Right foot pain - Plan: DG Foot Complete Right  Dyslipidemia - Plan: rosuvastatin (CRESTOR) 10 MG tablet  Screening for colon cancer - Plan: Ambulatory referral to Gastroenterology  Will check uric acid, BMP today rx for colchicine Use colchicine today- start back on allupurinol and crestor tomorrow Plain films of  foot today Referral to GI for screening colonoscopy   Signed Abbe AmsterdamJessica Demitris Pokorny, MD  Received his labs and x-ray reports 8/23 Results for orders placed or performed in visit on 08/01/18  Uric acid  Result Value Ref Range   Uric Acid, Serum 5.2 4.0 - 7.8 mg/dL  Basic metabolic panel  Result Value Ref Range   Sodium 137 135 - 145 mEq/L   Potassium 4.2 3.5 - 5.1 mEq/L   Chloride 104 96 - 112 mEq/L   CO2 24 19 - 32 mEq/L   Glucose, Bld 92 70 - 99 mg/dL   BUN 14 6 - 23 mg/dL   Creatinine, Ser 4.091.12 0.40 - 1.50 mg/dL   Calcium 9.6 8.4 - 81.110.5 mg/dL   GFR 91.4770.07 >82.95>60.00 mL/min

## 2018-08-01 NOTE — Patient Instructions (Signed)
It does look like you have gout in your foot We will get an x-ray today and also check the uric acid level in your blood.   Stop using the medication from your friend You can use colchicine as directed- take 2 pills, then 1 pill and hour later today. You can start back on the allopurinol tomorrow.

## 2018-08-01 NOTE — Addendum Note (Signed)
Addended by: Abbe AmsterdamOPLAND, Kortnie Stovall C on: 08/01/2018 10:10 AM   Modules accepted: Orders

## 2018-08-03 ENCOUNTER — Telehealth: Payer: Self-pay | Admitting: Family Medicine

## 2018-08-03 DIAGNOSIS — M1A0710 Idiopathic chronic gout, right ankle and foot, without tophus (tophi): Secondary | ICD-10-CM

## 2018-08-03 MED ORDER — PREDNISONE 20 MG PO TABS: ORAL_TABLET | ORAL | 0 refills | Status: DC

## 2018-08-03 NOTE — Telephone Encounter (Signed)
Called and was able to reach him.   As he has not responded to treatment I am not sure that this is gout.  However he does describe pain in the bed covers touch the toe, and now has sx in the left great toe as well (although not as severe). Also, walking does not really hurt him Will try prednisone- he is allergic to ibuprofen so cannot use an NSAID for him Cautioned him to seek emergency care if getting worse, or if not helping I will call and check in with him tomorrow. Also will refer to see sports med next week in case not better

## 2018-08-03 NOTE — Telephone Encounter (Signed)
Copied from CRM [id number]. Topic: Inquiry >> Aug 03, 2018  8:55 AM Maia Pettiesrtiz, Kristie S wrote: Reason for CRM: pt states that Colchicine (MITIGARE) 0.6 MG CAPS did not help him. Pt states the gout is affecting both feet on the big toe now. It was just the big toe on R foot before. Pt is asking if he should take additional medication. Pt states it hurts and he cannot sleep. Pt also states there was a culture Wednesday and xray but he hasn't heard from Dr. Patsy Lageropland. He states he was advised to call in a couple days if no improvement. Please advise pt.

## 2018-08-03 NOTE — Addendum Note (Signed)
Addended by: Abbe AmsterdamOPLAND, JESSICA C on: 08/03/2018 05:39 PM   Modules accepted: Orders

## 2018-08-03 NOTE — Telephone Encounter (Signed)
Called and did not reach, LMOM. Likely not gout as uric acid normal and colchicine did not help. Will try him back

## 2018-08-04 ENCOUNTER — Telehealth: Payer: Self-pay | Admitting: Family Medicine

## 2018-08-04 NOTE — Telephone Encounter (Signed)
Called pt and he seems to be improving with prednisone

## 2018-08-07 ENCOUNTER — Encounter: Payer: Self-pay | Admitting: Family Medicine

## 2018-08-07 ENCOUNTER — Ambulatory Visit: Payer: BLUE CROSS/BLUE SHIELD | Admitting: Family Medicine

## 2018-08-07 VITALS — BP 150/85 | HR 99 | Ht 72.0 in | Wt 180.0 lb

## 2018-08-07 DIAGNOSIS — M79675 Pain in left toe(s): Secondary | ICD-10-CM | POA: Diagnosis not present

## 2018-08-07 DIAGNOSIS — M79674 Pain in right toe(s): Secondary | ICD-10-CM

## 2018-08-07 DIAGNOSIS — G8929 Other chronic pain: Secondary | ICD-10-CM

## 2018-08-07 MED ORDER — PREDNISONE 10 MG PO TABS: ORAL_TABLET | ORAL | 0 refills | Status: DC

## 2018-08-07 NOTE — Patient Instructions (Addendum)
You have an acute gout flare on top of your underlying neuropathy. Take extended, higher dose prednisone course until this is completely gone. Take the colchicine 1 tablet twice a day. Icing 15 minutes at a time as needed. Consider hard-soled shoes to help with the pain. I'd stop the allopurinol for now - though it's debatable, it may cause gout flares to worsen. I don't think you need antibiotics based on the most recent research. Your last tetanus shot was in 2015 so you are good. Follow up with me in 1 week.

## 2018-08-07 NOTE — Progress Notes (Signed)
PCP and consultation requested by: [name], Jonathan FoundJessica C, MD  Subjective:   HPI: Patient is a 62 y.o. male here for bilateral great toe pain.  Patient reports roughly a month ago he had soreness in right great toe that was worse at night, associated warmth and redness. Could hardly stand when sheets touched his toe - began to get same symptoms on left great toe. No obvious swelling. No other joint involvement. Currently pain is 8/10 and sharp both great toes. States this is now going into other toes, across dorsal foot. He has underlying neuropathy - reports for 'at least a few years' he's had numbness in both feet. He tried a friend's probenecid which didn't help. Was on allopurinol previously but stopped this - he doesn't recall why. Was given colchicine by PCP for presumptive gout flare - he reports he took 3 tablets without much benefit. Was given prednisone - he took for 2 days then yesterday morning then stopped.  He felt the first 1.5 days it was helping him.  He hasn't taken in past 24 hours. No other skin changes.  Numbness is at his baseline he states.  Past Medical History:  Diagnosis Date  . Anxiety   . Diverticulitis 2011   resulted in partial colectomy  . Essential hypertension 06/06/2016  . Full dentures   . Gout   . Hematuria   . Wears glasses     Current Outpatient Medications on File Prior to Visit  Medication Sig Dispense Refill  . allopurinol (ZYLOPRIM) 300 MG tablet Take 1 tablet (300 mg total) by mouth daily. 30 tablet 6  . ALPRAZolam (XANAX) 0.25 MG tablet Take 1 tablet (0.25 mg total) by mouth 2 (two) times daily as needed for anxiety. 60 tablet 0  . amLODipine (NORVASC) 10 MG tablet Take 1 tablet (10 mg total) by mouth daily. 90 tablet 3  . Colchicine (MITIGARE) 0.6 MG CAPS Take 2 pills once, then 1 pill an hour later.  Hold the rest to use for future flare 30 capsule 20  . HYDROcodone-acetaminophen (NORCO) 7.5-325 MG tablet Take 1 tablet by mouth every 8  (eight) hours as needed for moderate pain. 70 tablet 0  . lisinopril (PRINIVIL,ZESTRIL) 40 MG tablet Take 1 tablet (40 mg total) by mouth daily. 90 tablet 3  . Multiple Vitamins-Minerals (MULTIVITAMIN WITH MINERALS) tablet Take 1 tablet by mouth daily. Reported on 03/21/2016    . rosuvastatin (CRESTOR) 10 MG tablet Take 1 tablet (10 mg total) by mouth daily. 90 tablet 3  . tamsulosin (FLOMAX) 0.4 MG CAPS capsule Take 1 capsule (0.4 mg total) by mouth daily. 30 capsule 6   No current facility-administered medications on file prior to visit.     Past Surgical History:  Procedure Laterality Date  . COLON SURGERY  2011   partial colectomy  . COLONOSCOPY    . FINGER ARTHROPLASTY  1990   rt index fx  . HERNIA REPAIR    . INGUINAL HERNIA REPAIR Left 05/27/2014   Procedure: LEFT INGUINAL HERNIA REPAIR WITH MESH;  Surgeon: Wilmon ArmsMatthew K. Corliss Skainssuei, MD;  Location: [facility];  Service: General;  Laterality: Left;  . INSERTION OF MESH Left 05/27/2014   Procedure: INSERTION OF MESH;  Surgeon: Wilmon ArmsMatthew K. Corliss Skainssuei, MD;  Location: [facility];  Service: General;  Laterality: Left;    Allergies  Allergen Reactions  . Ibuprofen Swelling    Social History   Socioeconomic History  . Marital status: Widowed    Spouse name:  died Mar 21, 2016  . Number of children: 1  . Years of education: Not on file  . Highest education level: Not on file  Occupational History  . Occupation: IT consultant  Social Needs  . Financial resource strain: Not on file  . Food insecurity:    Worry: Not on file    Inability: Not on file  . Transportation needs:    Medical: Not on file    Non-medical: Not on file  Tobacco Use  . Smoking status: Current Every Day Smoker    Packs/day: 1.50  . Smokeless tobacco: Never Used  Substance and Sexual Activity  . Alcohol use: Yes    Alcohol/week: 21.0 standard drinks    Types: 21 Cans of beer per week    Comment: about 3 beers daily- history of heavy  liquor abuse  . Drug use: No  . Sexual activity: Not on file  Lifestyle  . Physical activity:    Days per week: Not on file    Minutes per session: Not on file  . Stress: Not on file  Relationships  . Social connections:    Talks on phone: Not on file    Gets together: Not on file    Attends religious service: Not on file    Active member of club or organization: Not on file    Attends meetings of clubs or organizations: Not on file    Relationship status: Not on file  . Intimate partner violence:    Fear of current or ex partner: Not on file    Emotionally abused: Not on file    Physically abused: Not on file    Forced sexual activity: Not on file  Other Topics Concern  . Not on file  Social History Narrative   Lives alone following the death of his wife 2016/03/21 following 42 years of marriage.   His son lives in the [place] area, and visits on weekends.    Family History  Problem Relation Age of Onset  . Alzheimer's disease Mother   . Cancer Sister     BP (!) 150/85   Pulse 99   Ht 6' (1.829 m)   Wt 180 lb (81.6 kg)   BMI 24.41 kg/m   Review of Systems: See HPI above.     Objective:  Physical Exam:  Gen: NAD, comfortable in exam room  Right foot/ankle: Warmth distal foot especially in great toe.  No warmth digits 2-5.  No malrotation or angulation.   FROM ankle with 5/5 strength.  Able to flex and extend digits without pain. No TTP Negative ant drawer and talar tilt.   Negative syndesmotic compression. Negative metatarsal squeeze. Thompsons test negative. NV intact distally.  Left foot/ankle: Warmth distal foot especially in great toe.  No warmth digits 2-5.  No malrotation or angulation.   FROM ankle with 5/5 strength.  Able to flex and extend digits without pain. No TTP Negative ant drawer and talar tilt.   Negative syndesmotic compression. Negative metatarsal squeeze. Thompsons test negative. NV intact distally.   Assessment & Plan:  1.  Bilateral great toe pain - patient's history and exam consistent with acute gout flare with underlying peripheral neuropathy.  We discussed uric acid levels are normal in up to 43% of acute gout flares.  He has gout history, associated warmth and redness of great toes, along with current pain and recent history of severe pain with sheets touching these.  Believe he's incompletely treated this to date as he took  a couple days prednisone, 3 tabs of colchicine, then stopped these medications.  Advised taking an extended course of prednisone with colchicine twice a day.  On today's visit does not appear to have localized effusion (and pain spreading through foot) so advised against injections to 1st MTPs.  Icing.  Advised to stop allopurinol for now though we discussed it's debatable that this may worsen flares.  Offered hard soled shoe - declined for now.  Follow up in 1 week for reevaluation.  He also cut his thumb with scissors recently - unfortunately appears to be a defect instead of a clear laceration over pad of distal digit but without visible bone or tendon.  Advised this cannot be repaired.  Recent tetanus is within 5 years.  Advised against antibiotics given this was not a bite wound.

## 2018-08-10 ENCOUNTER — Telehealth: Payer: Self-pay | Admitting: Family Medicine

## 2018-08-10 IMAGING — CT CT CHEST W/O CM
1 series · 14 of 34 positions shown, 18 images · non-contrast
Comparison: CT of the chest on 07/07/2010 and CT of the abdomen on
02/15/2018

CLINICAL DATA: Follow-up of lung nodules.

EXAM:
CT CHEST WITHOUT CONTRAST
TECHNIQUE: Multidetector CT imaging of the chest was performed following the
standard protocol without IV contrast.

[Series 2: chest w/(date) · axial · 0.79mm/px · z∈[-324,-24]mm · 14 of 176 slices shown, 18 images]
[im 13/176  mediastinal]
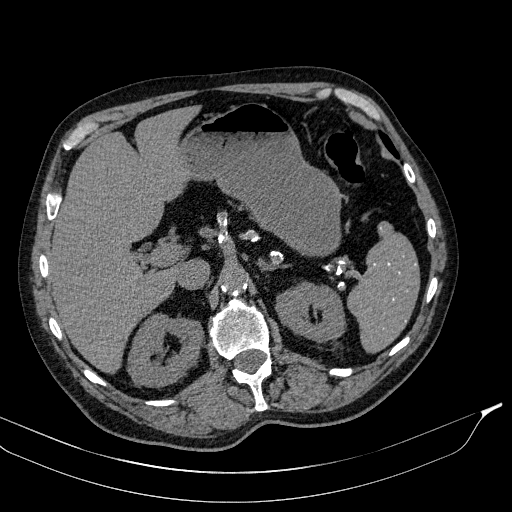
[im 13/176  lung]
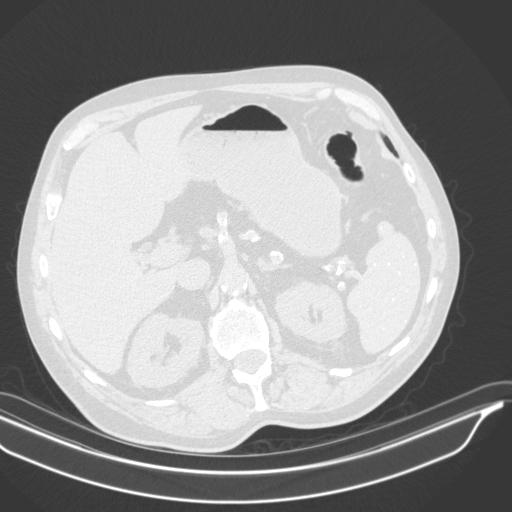
[im 26/176  lung]
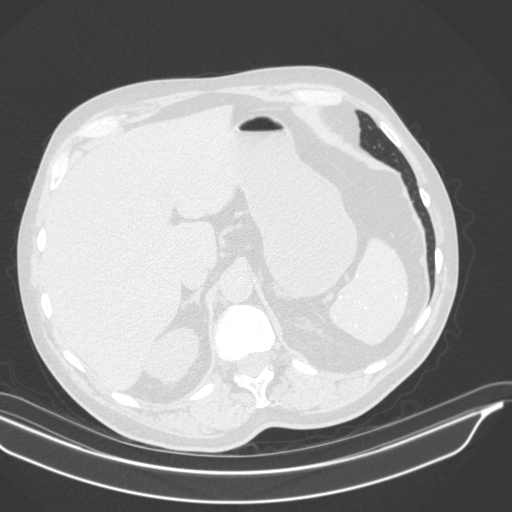
[im 36/176  lung]
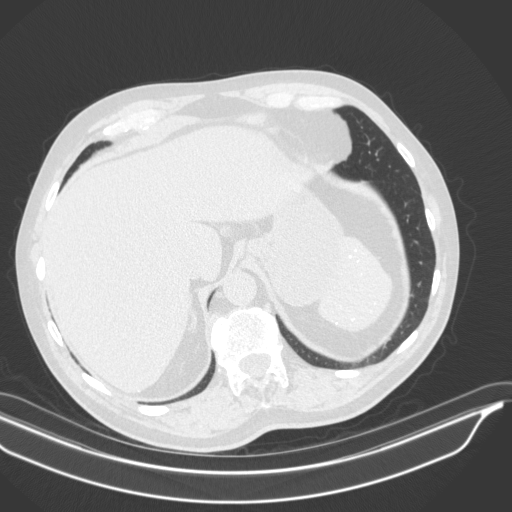
[im 52/176  lung]
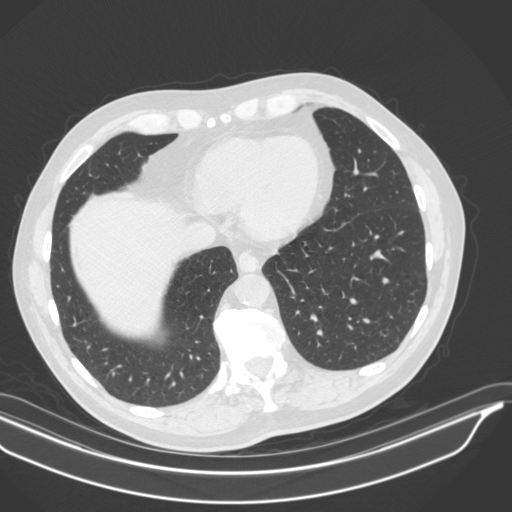
[im 65/176  mediastinal]
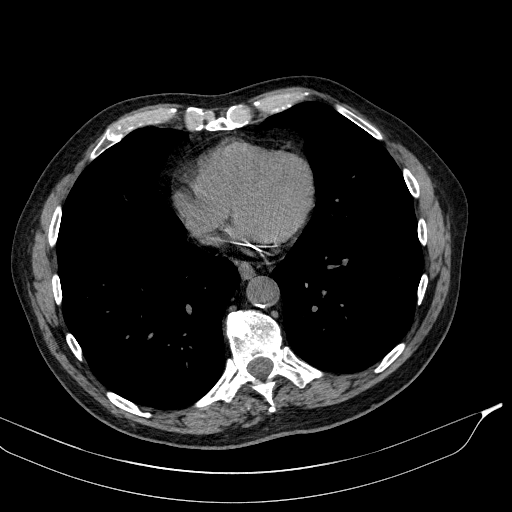
[im 65/176  lung]
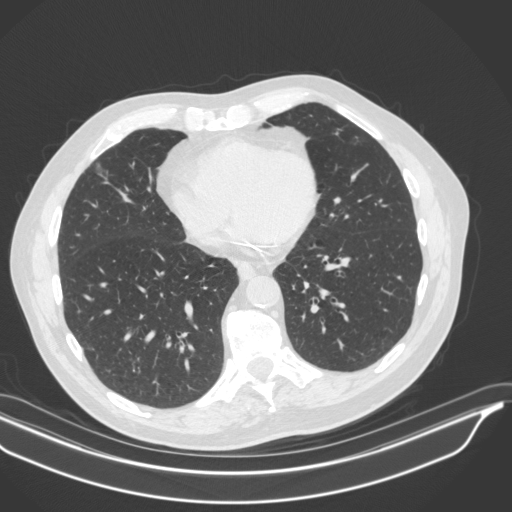
[im 72/176  lung]
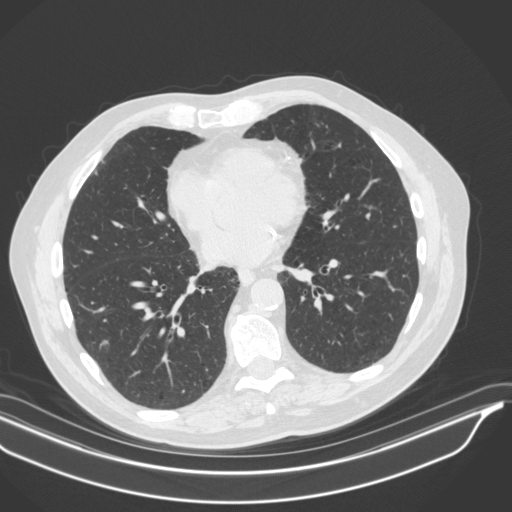
[im 84/176  lung]
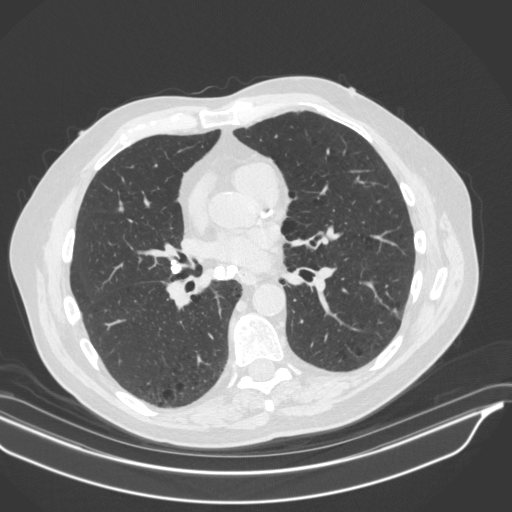
[im 93/176  lung]
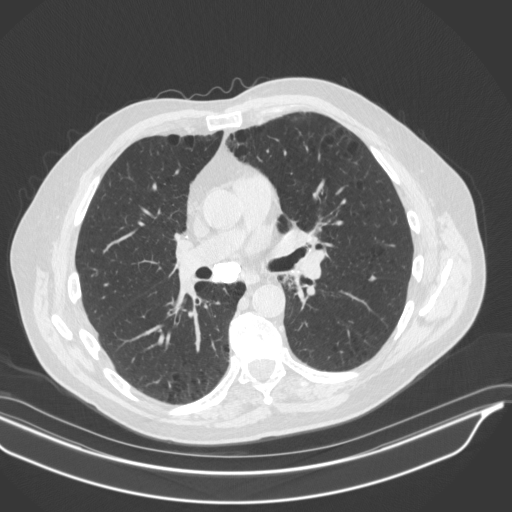
[im 104/176  mediastinal]
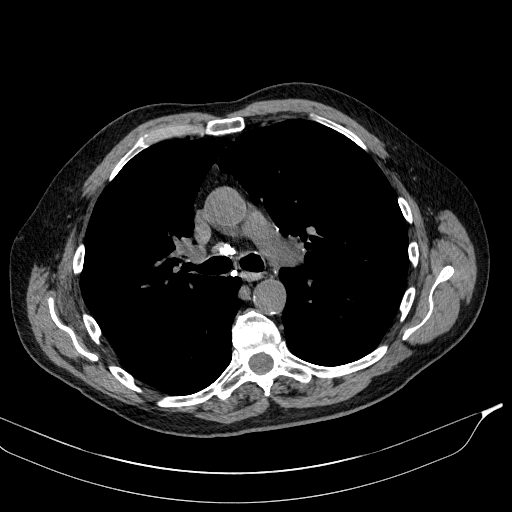
[im 104/176  lung]
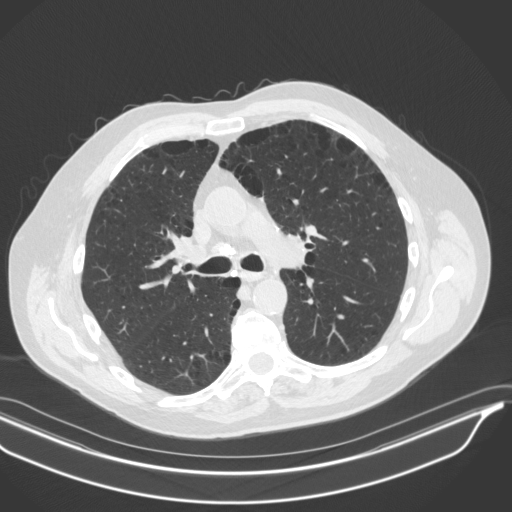
[im 111/176  lung]
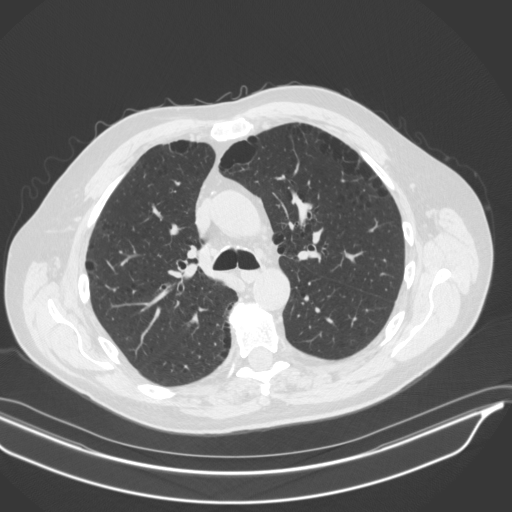
[im 130/176  lung]
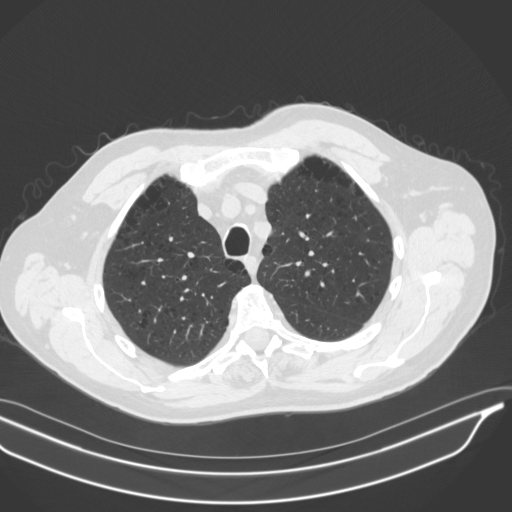
[im 141/176  lung]
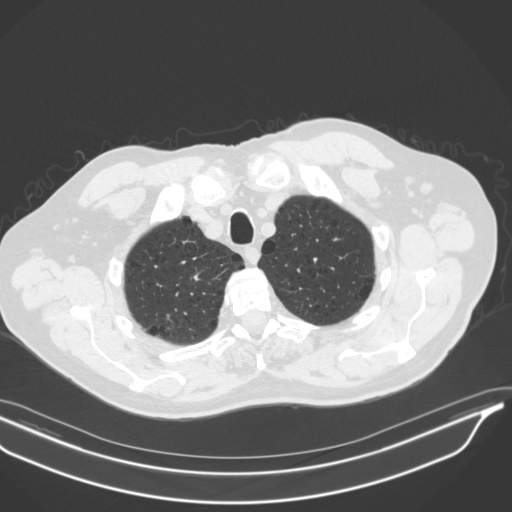
[im 150/176  mediastinal]
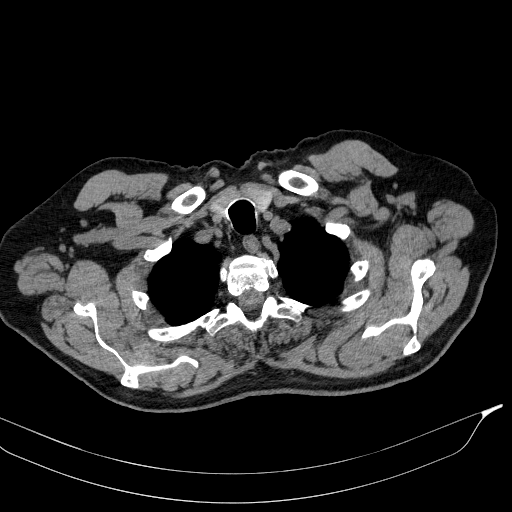
[im 150/176  lung]
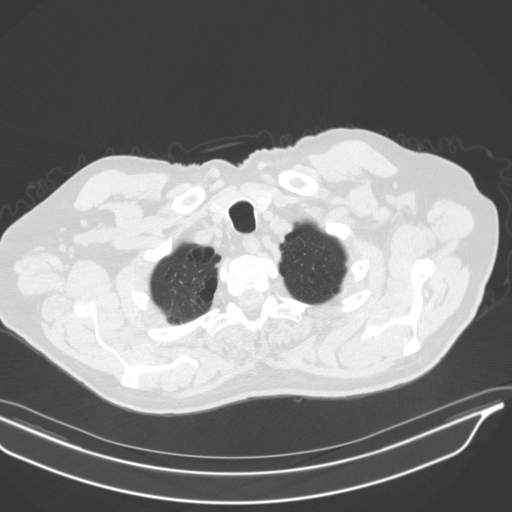
[im 163/176  lung]
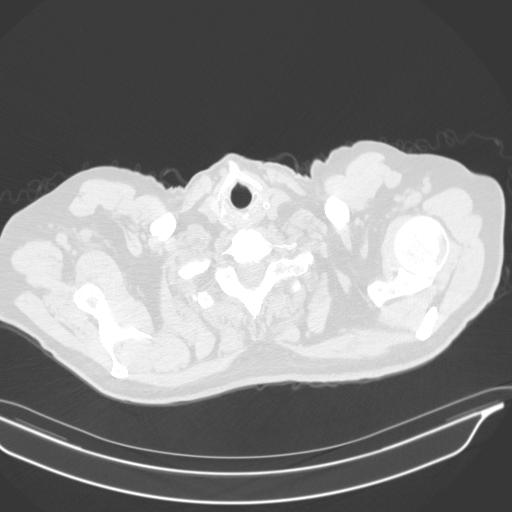

[14 of 34 positions shown; findings below may reference images not displayed]

FINDINGS: Cardiovascular: The heart size is normal. No pericardial fluid.
Calcified coronary arterial plaque again noted in a 2 vessel
distribution at the level of the LAD and left circumflex coronary
arteries. The thoracic aorta is normal in caliber and demonstrates a
mild amount of scattered calcified plaque. Central pulmonary
arteries are normal in caliber.

Mediastinum/Nodes: Again noted are multiple calcified mediastinal
and bilateral hilar lymph nodes consistent with prior granulomatous
disease.

Lungs/Pleura: Stable 4 mm right upper lobe peripheral noncalcified
lung nodule on image number 81, series 5. Stable right lower lobe
lung nodule on image number 106 measures 6 x 8 mm. This nodule is
felt to be stable in size and is more discretely marginated due to
the thin section 2 mm images obtained on the current study. This is
felt to account for the slight increased maximal diameter
measurement obtained. Morphology of this nodule is stable since 9911
and it is not felt to have grown. 8 mm subpleural nodule on image
number 111 within the right middle lobe at the edge of the minor
fissure is stable and has the appearance likely of scar as it is
flat and triangular in appearance on the coronal and sagittal
reconstructions. No further suggestion of subtle left upper lobe
nodule as seen on the 9911 study. Calcified granuloma is present in
the right lower lobe on image number 113 measuring roughly 4 mm.

Mild progression of emphysematous lung disease since the prior scan
in 9911. No evidence of pulmonary infiltrate, edema or pneumothorax.
No pleural effusions identified.

Upper Abdomen: Stable calcified granulomata throughout the spleen.
Stable sparse calcified granulomata in the liver.

Musculoskeletal: Stable mild degenerative disc disease of the
thoracic spine and several probable hemangiomas in lower thoracic
vertebral bodies.
IMPRESSION: 1. Stable right-sided pulmonary nodule since 9911. Stability over 8
years is consistent with benign etiology. These nodules do not
require further dedicated CT follow-up. No further suggestion of
subtle left upper lobe nodule.
2. Stable evidence prior granulomatous disease.
3. Coronary atherosclerosis again noted with calcified plaque in the
distribution of the LAD and left circumflex coronary arteries.
4. Probable mild progression of emphysematous lung disease since
9911 without acute pulmonary findings.

Emphysema ([id number]).

## 2018-08-10 NOTE — Telephone Encounter (Signed)
Called and checked on [name]- Dr. Pearletha ForgeHudnall had been concerned about his memory/ mental state.  He actually seemed to be ok to me on the phone but we will continue to watch him carefully

## 2018-08-14 ENCOUNTER — Ambulatory Visit: Payer: BLUE CROSS/BLUE SHIELD | Admitting: Family Medicine

## 2018-08-30 ENCOUNTER — Other Ambulatory Visit: Payer: Self-pay | Admitting: Family Medicine

## 2018-08-30 DIAGNOSIS — M545 Low back pain, unspecified: Secondary | ICD-10-CM

## 2018-08-30 DIAGNOSIS — G8929 Other chronic pain: Secondary | ICD-10-CM

## 2018-08-30 DIAGNOSIS — F4321 Adjustment disorder with depressed mood: Secondary | ICD-10-CM

## 2018-08-30 DIAGNOSIS — F411 Generalized anxiety disorder: Secondary | ICD-10-CM

## 2018-08-30 MED ORDER — HYDROCODONE-ACETAMINOPHEN 5-325 MG PO TABS: 1.0000 | ORAL_TABLET | Freq: Three times a day (TID) | ORAL | 0 refills | Status: DC | PRN

## 2018-08-30 NOTE — Telephone Encounter (Signed)
See other rx encounter

## 2018-08-30 NOTE — Telephone Encounter (Signed)
Copied from CRM [id number]. Topic: Quick Communication - Rx Refill/Question >> Aug 30, 2018 11:05 AM Stephannie LiSimmons, Verniece Encarnacion L, NT wrote: Medication HYDROcodone-acetaminophen (NORCO) 7.5-325 MG tablet  Has the patient contacted their pharmacy? no: (Agent: If no, request that the patient contact the pharmacy for the refill. (Agent: If yes, when and what did the pharmacy advise?  Preferred Pharmacy (with phone number or street name  [id number]- [place], [place] - [address] [phone] (Phone) [phone] (Fax)    Agent: Please be advised that RX refills may take up to 3 business days. We ask that you follow-up with your pharmacy.

## 2018-08-30 NOTE — Telephone Encounter (Signed)
Requesting:Alprazolam Contract:04/12/17 UDS:06/20/18 Last Visit:08/01/18 Next Visit:none Last Refill:07/31/18  Please Advise

## 2018-08-30 NOTE — Telephone Encounter (Signed)
Pt also needing a refill on ALPRAZolam (XANAX) 0.25 MG tablet [[id number]][[id number]] pt states he is going out of town Saturday. Please advise

## 2018-08-30 NOTE — Telephone Encounter (Signed)
[facility]:  07/31/2018  1  07/31/2018  Alprazolam 0.25 Mg Tablet  60.00 30 Je Cop  [id number]  Nor ([id number])  0/0 1.00 LME Comm Ins  [place]  07/31/2018  1  07/31/2018  Hydrocodone-Acetamin 7.5-325  70.00 25 Je Cop  [id number]  Nor ([id number])  0/0 21.00 MME Comm Ins  [place]  07/01/2018  1  07/01/2018  Hydrocodone-Acetamin 10-325 Mg  70.00 23 Je Cop  [id number]  Nor ([id number])  0/0 30.43 MME Comm Ins  [place]  07/01/2018  1  07/01/2018  Alprazolam 0.5 Mg Tablet  60.00 30 Je Cop  [id number]  Nor ([id number])  0/0 2.00 LME Comm Ins  [place]  06/04/2018  1  06/04/2018  Hydrocodone-Acetamin 10-325 Mg  70.00 23 Je Cop  [id number]  Nor ([id number])  0/0 30.43 MME Comm Ins  [place]  05/04/2018  1  05/04/2018  Hydrocodone-Acetamin 10-325 Mg  70.00 23 Je Cop  [id number]  Nor ([id number])  0/0 30.43 MME Comm Ins  [place]  04/09/2018  1  04/09/2018  Hydrocodone-Acetamin 10-325 Mg  70.00 23 Je Cop  [id number]  Nor ([id number])  0/0 30.43 MME Comm Ins  [place]  04/02/2018  1  04/02/2018  Alprazolam 1 Mg Tablet  180.00 90 Je Cop  [id number]  Nor ([id number])  0/0 4.00 LME Comm Ins  [place]  03/06/2018  1  03/06/2018  Hydrocodone-Acetamin 10-325 Mg  70.00 23 Je Cop  [id number]  Nor ([id number])  0/      We have been in the process of tapering him off his xanax and hydrocodone. Will rx for hydrocodone 5 mg, leave xanax strength the same for now but give 45 pills for the month

## 2018-08-30 NOTE — Telephone Encounter (Signed)
Norco refill Last Refill:07/31/18 # 70 with 0 refill Last OV: 08/01/18 PCP: Dr. Patsy Lageropland Pharmacy:[facility] [address]

## 2018-08-31 ENCOUNTER — Other Ambulatory Visit: Payer: Self-pay

## 2018-08-31 DIAGNOSIS — Z5181 Encounter for therapeutic drug level monitoring: Secondary | ICD-10-CM

## 2018-08-31 NOTE — Telephone Encounter (Signed)
Copied from CRM [phone]. Topic: General - Other >> Aug 31, 2018 10:10 AM Gerrianne ScalePayne, Angela L wrote: Reason for CRM: patient calling stating that the HYDROcodone-acetaminophen (NORCO/VICODIN) 5-325 MG tablet want help for pain he uses the medicine for arthritis he states that it want help him out with the pain

## 2018-09-03 NOTE — Telephone Encounter (Signed)
We have already discussed plan to taper him off narcotics in detail

## 2018-09-04 NOTE — Telephone Encounter (Signed)
Pt states he is still waiting for a call back.  Pt states he cannot get by on 5 mg norco. Pt states he is having to take 1 1/2 - 2 tabs a day. Pt cannot work like this and wants to get this increased. Pt would like a call back. [phone] Or work number [phone]

## 2018-09-05 NOTE — Telephone Encounter (Signed)
Called pt to discuss. We have been tapering him off narcotics and benzos as he has been drinking alcohol on a regular basis and his last UDS showed alcohol.  Jonathan Harrington states that his pain is not controlled on 5 mg of hydrocodone and that he needs to go back to 7.5 mg.  He also states that he is now drinking 1-2 beers per night and promises me that this is his actual, honest amount.  He has stopped drinking liquor. I think he is safe to stop drinking from this amount,and he is still also on xanax.  He will come in and do a UDS for me next week- if this does not show alcohol we can increase his hydrocodone back to 7.5 mg. He states understanding and agreement with plan

## 2018-09-05 NOTE — Addendum Note (Signed)
Addended by: Abbe AmsterdamOPLAND, JESSICA C on: 09/05/2018 10:40 AM   Modules accepted: Orders

## 2018-09-07 ENCOUNTER — Encounter: Payer: Self-pay | Admitting: Family Medicine

## 2018-09-07 ENCOUNTER — Ambulatory Visit: Payer: BLUE CROSS/BLUE SHIELD | Admitting: Family Medicine

## 2018-09-07 VITALS — BP 158/70 | HR 92 | Ht 72.0 in | Wt 183.0 lb

## 2018-09-07 DIAGNOSIS — G8929 Other chronic pain: Secondary | ICD-10-CM

## 2018-09-07 DIAGNOSIS — M79674 Pain in right toe(s): Secondary | ICD-10-CM

## 2018-09-07 DIAGNOSIS — M79675 Pain in left toe(s): Secondary | ICD-10-CM

## 2018-09-07 MED ORDER — GABAPENTIN 300 MG PO CAPS: 300.0000 mg | ORAL_CAPSULE | Freq: Every day | ORAL | 2 refills | Status: DC

## 2018-09-07 NOTE — Patient Instructions (Signed)
I'm concerned you have a peripheral neuropathy; clinically you don't have a gout flare now and this has resolved. Try the gabapentin 300mg  just between dinner and bedtime. Let me know how you're doing in 1-2 weeks. If not improving we can consider an injection into the base of one of your big toes as a trial to see if this helps but there's only evidence of mild arthritis here now based on ultrasound.

## 2018-09-09 ENCOUNTER — Encounter: Payer: Self-pay | Admitting: Family Medicine

## 2018-09-09 NOTE — Progress Notes (Signed)
PCP and consultation requested by: [name], Gwenlyn Found, MD  Subjective:   HPI: Patient is a 62 y.o. male here for bilateral great toe pain.  8/27: Patient reports roughly a month ago he had soreness in right great toe that was worse at night, associated warmth and redness. Could hardly stand when sheets touched his toe - began to get same symptoms on left great toe. No obvious swelling. No other joint involvement. Currently pain is 8/10 and sharp both great toes. States this is now going into other toes, across dorsal foot. He has underlying neuropathy - reports for 'at least a few years' he's had numbness in both feet. He tried a friend's probenecid which didn't help. Was on allopurinol previously but stopped this - he doesn't recall why. Was given colchicine by PCP for presumptive gout flare - he reports he took 3 tablets without much benefit. Was given prednisone - he took for 2 days then yesterday morning then stopped.  He felt the first 1.5 days it was helping him.  He hasn't taken in past 24 hours. No other skin changes.  Numbness is at his baseline he states.  9/27: Patient returns stating he's back to having 8/10 left great toe pain, 7/10 pain on the right. Worst at nighttime, difficulty sleeping. Better during the day. Finished the prednisone and colchicine. No fever, redness, warmth.  Past Medical History:  Diagnosis Date  . Anxiety   . Diverticulitis 2011   resulted in partial colectomy  . Essential hypertension 06/06/2016  . Full dentures   . Gout   . Hematuria   . Wears glasses     Current Outpatient Medications on File Prior to Visit  Medication Sig Dispense Refill  . allopurinol (ZYLOPRIM) 300 MG tablet Take 1 tablet (300 mg total) by mouth daily. 30 tablet 6  . ALPRAZolam (XANAX) 0.25 MG tablet Take 1/2 or 1 tablet once or twice a day as needed for anxiety.  Note this rx is for 45 pills 45 tablet 0  . amLODipine (NORVASC) 10 MG tablet Take 1 tablet (10 mg  total) by mouth daily. 90 tablet 3  . lisinopril (PRINIVIL,ZESTRIL) 40 MG tablet Take 1 tablet (40 mg total) by mouth daily. 90 tablet 3  . Multiple Vitamins-Minerals (MULTIVITAMIN WITH MINERALS) tablet Take 1 tablet by mouth daily. Reported on 03/21/2016    . rosuvastatin (CRESTOR) 10 MG tablet Take 1 tablet (10 mg total) by mouth daily. 90 tablet 3  . tamsulosin (FLOMAX) 0.4 MG CAPS capsule Take 1 capsule (0.4 mg total) by mouth daily. 30 capsule 6   No current facility-administered medications on file prior to visit.     Past Surgical History:  Procedure Laterality Date  . COLON SURGERY  2011   partial colectomy  . COLONOSCOPY    . FINGER ARTHROPLASTY  1990   rt index fx  . HERNIA REPAIR    . INGUINAL HERNIA REPAIR Left 05/27/2014   Procedure: LEFT INGUINAL HERNIA REPAIR WITH MESH;  Surgeon: Wilmon Arms. Corliss Skains, MD;  Location: [facility];  Service: General;  Laterality: Left;  . INSERTION OF MESH Left 05/27/2014   Procedure: INSERTION OF MESH;  Surgeon: Wilmon Arms. Corliss Skains, MD;  Location: [facility];  Service: General;  Laterality: Left;    Allergies  Allergen Reactions  . Ibuprofen Swelling    Social History   Socioeconomic History  . Marital status: Widowed    Spouse name: died 05-Apr-2016  . Number of children: 1  .  Years of education: Not on file  . Highest education level: Not on file  Occupational History  . Occupation: IT consultant  Social Needs  . Financial resource strain: Not on file  . Food insecurity:    Worry: Not on file    Inability: Not on file  . Transportation needs:    Medical: Not on file    Non-medical: Not on file  Tobacco Use  . Smoking status: Current Every Day Smoker    Packs/day: 1.50  . Smokeless tobacco: Never Used  Substance and Sexual Activity  . Alcohol use: Yes    Alcohol/week: 21.0 standard drinks    Types: 21 Cans of beer per week    Comment: about 3 beers daily- history of heavy liquor abuse  . Drug  use: No  . Sexual activity: Not on file  Lifestyle  . Physical activity:    Days per week: Not on file    Minutes per session: Not on file  . Stress: Not on file  Relationships  . Social connections:    Talks on phone: Not on file    Gets together: Not on file    Attends religious service: Not on file    Active member of club or organization: Not on file    Attends meetings of clubs or organizations: Not on file    Relationship status: Not on file  . Intimate partner violence:    Fear of current or ex partner: Not on file    Emotionally abused: Not on file    Physically abused: Not on file    Forced sexual activity: Not on file  Other Topics Concern  . Not on file  Social History Narrative   Lives alone following the death of his wife 02-Apr-2016 following 42 years of marriage.   His son lives in the [place] area, and visits on weekends.    Family History  Problem Relation Age of Onset  . Alzheimer's disease Mother   . Cancer Sister     BP (!) 158/70   Pulse 92   Ht 6' (1.829 m)   Wt 183 lb (83 kg)   BMI 24.82 kg/m   Review of Systems: See HPI above.     Objective:  Physical Exam:  Gen: NAD, comfortable in exam room  Right foot/ankle: No gross deformity, swelling, ecchymoses, redness. FROM ankle without pain.  Mod limitation flexion and extension great toe MTP and IP joints but without pain. No TTP currently. Negative ant drawer and talar tilt.   Thompsons test negative. States diminished sensation of great toe.  Left foot/ankle: No gross deformity, swelling, ecchymoses, redness. FROM ankle without pain.  Mod limitation flexion and extension great toe MTP and IP joints but without pain. No TTP currently. Negative ant drawer and talar tilt.   Thompsons test negative. States diminished sensation of great toe.   MSK u/s:  Minimal effusions of 1st MTP joints.  No evidence crystals.  Assessment & Plan:  1. Bilateral great toe pain - No evidence of gouty  arthritis at this time - no pain with palpation or motion, no warmth or redness to joints of great toes.  Concerned about him having a peripheral neuropathy accounting for his current pain.  Will try gabapentin in evenings.  Let us know how he's doing in 1-2 weeks.  Can consider trial of injection to 1st MTP on more symptomatic left side if not improving with gabapentin.

## 2018-09-11 ENCOUNTER — Encounter: Payer: Self-pay | Admitting: Family Medicine

## 2018-09-11 ENCOUNTER — Ambulatory Visit (INDEPENDENT_AMBULATORY_CARE_PROVIDER_SITE_OTHER): Payer: BLUE CROSS/BLUE SHIELD | Admitting: Family Medicine

## 2018-09-11 ENCOUNTER — Telehealth: Payer: Self-pay | Admitting: Family Medicine

## 2018-09-11 DIAGNOSIS — M79675 Pain in left toe(s): Secondary | ICD-10-CM

## 2018-09-11 MED ORDER — METHYLPREDNISOLONE ACETATE 40 MG/ML IJ SUSP
20.0000 mg | Freq: Once | INTRAMUSCULAR | Status: AC
  Administered 2018-09-11: 20 mg via INTRA_ARTICULAR

## 2018-09-11 NOTE — Telephone Encounter (Signed)
He's on a low dose, wanted to make sure he tolerated it.  We can consider two things - would recommend trying an injection into one of his toes, the more symptomatic side.  Other option would be to increase the medicine.  But I think we should try an injection as next step.

## 2018-09-11 NOTE — Telephone Encounter (Signed)
Spoke with patient. He decided to go with the injection as the next step and has been scheduled to come in today

## 2018-09-11 NOTE — Telephone Encounter (Signed)
Patient states medication prescribed at last office visit is not helping. He states he is still in pain and has been unable to sleep since starting the medication.   Patient asking if he should still continue with medication

## 2018-09-11 NOTE — Progress Notes (Signed)
PCP and consultation requested by: [name], Gwenlyn Found, MD  Subjective:   HPI: Patient is a 62 y.o. male here for bilateral great toe pain.  8/27: Patient reports roughly a month ago he had soreness in right great toe that was worse at night, associated warmth and redness. Could hardly stand when sheets touched his toe - began to get same symptoms on left great toe. No obvious swelling. No other joint involvement. Currently pain is 8/10 and sharp both great toes. States this is now going into other toes, across dorsal foot. He has underlying neuropathy - reports for 'at least a few years' he's had numbness in both feet. He tried a friend's probenecid which didn't help. Was on allopurinol previously but stopped this - he doesn't recall why. Was given colchicine by PCP for presumptive gout flare - he reports he took 3 tablets without much benefit. Was given prednisone - he took for 2 days then yesterday morning then stopped.  He felt the first 1.5 days it was helping him.  He hasn't taken in past 24 hours. No other skin changes.  Numbness is at his baseline he states.  9/27: Patient returns stating he's back to having 8/10 left great toe pain, 7/10 pain on the right. Worst at nighttime, difficulty sleeping. Better during the day. Finished the prednisone and colchicine. No fever, redness, warmth.  10/1: Patient returns for injection into left 1st MTP joint. Did not notice benefit with gabapentin at nighttime.  Past Medical History:  Diagnosis Date  . Anxiety   . Diverticulitis 2011   resulted in partial colectomy  . Essential hypertension 06/06/2016  . Full dentures   . Gout   . Hematuria   . Wears glasses     Current Outpatient Medications on File Prior to Visit  Medication Sig Dispense Refill  . allopurinol (ZYLOPRIM) 300 MG tablet Take 1 tablet (300 mg total) by mouth daily. 30 tablet 6  . ALPRAZolam (XANAX) 0.25 MG tablet Take 1/2 or 1 tablet once or twice a day as  needed for anxiety.  Note this rx is for 45 pills 45 tablet 0  . amLODipine (NORVASC) 10 MG tablet Take 1 tablet (10 mg total) by mouth daily. 90 tablet 3  . gabapentin (NEURONTIN) 300 MG capsule Take 1 capsule (300 mg total) by mouth at bedtime. 30 capsule 2  . lisinopril (PRINIVIL,ZESTRIL) 40 MG tablet Take 1 tablet (40 mg total) by mouth daily. 90 tablet 3  . Multiple Vitamins-Minerals (MULTIVITAMIN WITH MINERALS) tablet Take 1 tablet by mouth daily. Reported on 03/21/2016    . rosuvastatin (CRESTOR) 10 MG tablet Take 1 tablet (10 mg total) by mouth daily. 90 tablet 3  . tamsulosin (FLOMAX) 0.4 MG CAPS capsule Take 1 capsule (0.4 mg total) by mouth daily. 30 capsule 6   No current facility-administered medications on file prior to visit.     Past Surgical History:  Procedure Laterality Date  . COLON SURGERY  2011   partial colectomy  . COLONOSCOPY    . FINGER ARTHROPLASTY  1990   rt index fx  . HERNIA REPAIR    . INGUINAL HERNIA REPAIR Left 05/27/2014   Procedure: LEFT INGUINAL HERNIA REPAIR WITH MESH;  Surgeon: Wilmon Arms. Corliss Skains, MD;  Location: [facility];  Service: General;  Laterality: Left;  . INSERTION OF MESH Left 05/27/2014   Procedure: INSERTION OF MESH;  Surgeon: Wilmon Arms. Corliss Skains, MD;  Location: [facility];  Service: General;  Laterality: Left;  Allergies  Allergen Reactions  . Ibuprofen Swelling    Social History   Socioeconomic History  . Marital status: Widowed    Spouse name: died 03-12-2016  . Number of children: 1  . Years of education: Not on file  . Highest education level: Not on file  Occupational History  . Occupation: IT consultant  Social Needs  . Financial resource strain: Not on file  . Food insecurity:    Worry: Not on file    Inability: Not on file  . Transportation needs:    Medical: Not on file    Non-medical: Not on file  Tobacco Use  . Smoking status: Current Every Day Smoker    Packs/day: 1.50  .  Smokeless tobacco: Never Used  Substance and Sexual Activity  . Alcohol use: Yes    Alcohol/week: 21.0 standard drinks    Types: 21 Cans of beer per week    Comment: about 3 beers daily- history of heavy liquor abuse  . Drug use: No  . Sexual activity: Not on file  Lifestyle  . Physical activity:    Days per week: Not on file    Minutes per session: Not on file  . Stress: Not on file  Relationships  . Social connections:    Talks on phone: Not on file    Gets together: Not on file    Attends religious service: Not on file    Active member of club or organization: Not on file    Attends meetings of clubs or organizations: Not on file    Relationship status: Not on file  . Intimate partner violence:    Fear of current or ex partner: Not on file    Emotionally abused: Not on file    Physically abused: Not on file    Forced sexual activity: Not on file  Other Topics Concern  . Not on file  Social History Narrative   Lives alone following the death of his wife 03-12-16 following 42 years of marriage.   His son lives in the [place] area, and visits on weekends.    Family History  Problem Relation Age of Onset  . Alzheimer's disease Mother   . Cancer Sister     BP 129/69   Pulse 93   Ht 6' (1.829 m)   Wt 186 lb (84.4 kg)   BMI 25.23 kg/m   Review of Systems: See HPI above.     Objective:  Physical Exam:  Gen: NAD, comfortable in exam room  Rest of exam not repeated today. Right foot/ankle: No gross deformity, swelling, ecchymoses, redness. FROM ankle without pain.  Mod limitation flexion and extension great toe MTP and IP joints but without pain. No TTP currently. Negative ant drawer and talar tilt.   Thompsons test negative. States diminished sensation of great toe.  Left foot/ankle: No gross deformity, swelling, ecchymoses, redness. FROM ankle without pain.  Mod limitation flexion and extension great toe MTP and IP joints but without pain. No TTP  currently. Negative ant drawer and talar tilt.   Thompsons test negative. States diminished sensation of great toe.   MSK u/s:  Minimal effusions of 1st MTP joints.  No evidence crystals.  Assessment & Plan:  1. Bilateral great toe pain - No evidence of gouty arthritis at this time - no pain with palpation or motion, no warmth or redness to joints of great toes.  Still concerned about possible peripheral neuropathy given pain in both great toes.  Trialed low  dose gabapentin and he didn't notice a difference so far - left 1st MTP injected today - see note below.  Let us know how he's doing in a week.  Consider injection on right if improving.    After informed written consent patient timeout was performed and patient was seated on exam table.  Left 1st MTP identified with ultrasound, alcohol swab and cold spray used for prep.  Then utilizing ultrasound guidance, patient's left 1st MTP joint was injected with 0.5:0.68mL bupivicaine:depomedrol.  Patient tolerated procedure well without immediate complications.

## 2018-09-11 NOTE — Patient Instructions (Signed)
You were given an injection into the base of your great toe - let me know how you're doing in a week. If this helps you we can do an injection on the other side. If you don't get benefit with these we can revisit the nerve-blocking medicines, consider neurology evaluation for neuropathy.

## 2018-09-13 ENCOUNTER — Ambulatory Visit: Payer: BLUE CROSS/BLUE SHIELD | Admitting: Family Medicine

## 2018-09-13 ENCOUNTER — Encounter: Payer: Self-pay | Admitting: Family Medicine

## 2018-09-13 VITALS — BP 140/70 | HR 95 | Temp 98.2°F | Resp 18 | Ht 72.0 in | Wt 186.0 lb

## 2018-09-13 DIAGNOSIS — G8929 Other chronic pain: Secondary | ICD-10-CM

## 2018-09-13 DIAGNOSIS — Z5181 Encounter for therapeutic drug level monitoring: Secondary | ICD-10-CM | POA: Diagnosis not present

## 2018-09-13 DIAGNOSIS — M549 Dorsalgia, unspecified: Secondary | ICD-10-CM

## 2018-09-13 DIAGNOSIS — M545 Low back pain, unspecified: Secondary | ICD-10-CM

## 2018-09-13 NOTE — Progress Notes (Addendum)
Jonathan Harrington [address] [place], [place] [address] [phone]  Date:  09/13/2018   Name:  Jonathan Harrington   DOB:  1955-12-22   MRN:  [id number]  PCP:  Pearline Cables, MD    Chief Complaint: Chronic Back Pain (follow up, uds)   History of Present Illness:  Jonathan Harrington is a 62 y.o. very pleasant male patient who presents with the following:  Following up today I had been tapering Jonathan Harrington off his pain medication and xanax due to alcohol use However he notes that the lower dose of pain medication is not controlling his joint pains and had told me that he stopped drinking. I had asked him to come in for a UDS so we can document this and then potentially go back up to 7.5 mg of hydrocodone He is also having toe pain- saw Dr. Pearletha Forge earlier this week and they did an MTP injection for him  08/30/2018  1  08/30/2018  Hydrocodone-Acetamin 5-325 Mg  70.00 24 Je Cop  [id number]  Nor (2372)  0/0 14.58 MME Comm Ins  [place]  08/30/2018  1  08/30/2018  Alprazolam 0.25 Mg Tablet  45.00 30 Je Cop  [id number]  Nor (2372)  0/0 0.75 LME Comm Ins  [place]  07/31/2018  1  07/31/2018  Hydrocodone-Acetamin 7.5-325  70.00 25 Je Cop  [id number]  Nor (2372)  0/0 21.00 MME Comm Ins  [place]  07/31/2018  1  07/31/2018  Alprazolam 0.25 Mg Tablet  60.00 30 Je Cop  [id number]  Nor (2372)  0/0 1.00 LME Comm Ins  [place]  07/01/2018  1  07/01/2018  Hydrocodone-Acetamin 10-325 Mg  70.00 23 Je Cop  [id number]  Nor (2372)  0/0 30.43 MME Comm Ins  [place]  07/01/2018  1  07/01/2018  Alprazolam 0.5 Mg Tablet  60.00 30 Je Cop  [id number]  Nor (2372)  0/0 2.00 LME Comm Ins  [place]  06/04/2018  1  06/04/2018  Hydrocodone-Acetamin 10-325 Mg  70.00 23 Je Cop  [id number]  Nor (2372)  0/0 30.43 MME Comm Ins  [place]  05/04/2018  1  05/04/2018  Hydrocodone-Acetamin 10-325 Mg  70.00 23 Je Cop  [id number]  Nor (2372)  0/0      He is still working in physically active job.  He will be able to retire in 2 years  with full benefits  On discussion Jonathan Harrington states he has not been drinking alcohol at all- except he drank wine last night.  He also had a beer last week  Again explained to Jonathan Harrington that if he wishes for me to prescribe these medications for him he will need to abstain from alcohol as we have discussed.  Explained that it is difficulty for me to help him when he tells me he is not drinking but then admits to drinking wine the night before he was coming in for a UDS (which we are obtaining for the purpose of showing that he is not drinking)   Patient Active Problem List   Diagnosis Date Noted  . Essential hypertension 06/06/2016  . Smoker 05/12/2016  . Left inguinal hernia 05/23/2014  . Anxiety state, unspecified 09/17/2013  . Gout 01/17/2013  . DIVERTICULOSIS-COLON 08/13/2010  . DIVERTICULITIS, COLON 08/13/2010  . ABDOMINAL PAIN-LLQ 08/13/2010  . Nonspecific (abnormal) findings on radiological and other examination of body structure 08/13/2010  . PERSONAL HX COLONIC POLYPS 08/13/2010  . NONSPCIFC ABN FINDING  RAD & OTH EXAM LUNG FIELD 08/13/2010    Past Medical History:  Diagnosis Date  . Anxiety   . Diverticulitis 2011   resulted in partial colectomy  . Essential hypertension 06/06/2016  . Full dentures   . Gout   . Hematuria   . Wears glasses     Past Surgical History:  Procedure Laterality Date  . COLON SURGERY  2011   partial colectomy  . COLONOSCOPY    . FINGER ARTHROPLASTY  1990   rt index fx  . HERNIA REPAIR    . INGUINAL HERNIA REPAIR Left 05/27/2014   Procedure: LEFT INGUINAL HERNIA REPAIR WITH MESH;  Surgeon: Wilmon Arms. Corliss Skains, MD;  Location: [facility];  Service: General;  Laterality: Left;  . INSERTION OF MESH Left 05/27/2014   Procedure: INSERTION OF MESH;  Surgeon: Wilmon Arms. Corliss Skains, MD;  Location: [facility];  Service: General;  Laterality: Left;    Social History   Tobacco Use  . Smoking status: Current Every Day Smoker     Packs/day: 1.50  . Smokeless tobacco: Never Used  Substance Use Topics  . Alcohol use: Yes    Alcohol/week: 21.0 standard drinks    Types: 21 Cans of beer per week    Comment: about 3 beers daily- history of heavy liquor abuse  . Drug use: No    Family History  Problem Relation Age of Onset  . Alzheimer's disease Mother   . Cancer Sister     Allergies  Allergen Reactions  . Ibuprofen Swelling    Medication list has been reviewed and updated.  Current Outpatient Medications on File Prior to Visit  Medication Sig Dispense Refill  . allopurinol (ZYLOPRIM) 300 MG tablet Take 1 tablet (300 mg total) by mouth daily. 30 tablet 6  . ALPRAZolam (XANAX) 0.25 MG tablet Take 1/2 or 1 tablet once or twice a day as needed for anxiety.  Note this rx is for 45 pills 45 tablet 0  . amLODipine (NORVASC) 10 MG tablet Take 1 tablet (10 mg total) by mouth daily. 90 tablet 3  . gabapentin (NEURONTIN) 300 MG capsule Take 1 capsule (300 mg total) by mouth at bedtime. 30 capsule 2  . lisinopril (PRINIVIL,ZESTRIL) 40 MG tablet Take 1 tablet (40 mg total) by mouth daily. 90 tablet 3  . Multiple Vitamins-Minerals (MULTIVITAMIN WITH MINERALS) tablet Take 1 tablet by mouth daily. Reported on 03/21/2016    . rosuvastatin (CRESTOR) 10 MG tablet Take 1 tablet (10 mg total) by mouth daily. 90 tablet 3  . tamsulosin (FLOMAX) 0.4 MG CAPS capsule Take 1 capsule (0.4 mg total) by mouth daily. 30 capsule 6   No current facility-administered medications on file prior to visit.     Review of Systems:  As per HPI- otherwise negative.   Physical Examination: Vitals:   09/13/18 1725  BP: 140/70  Pulse: 95  Resp: 18  Temp: 98.2 F (36.8 C)  SpO2: 99%   Vitals:   09/13/18 1725  Weight: 186 lb (84.4 kg)  Height: 6' (1.829 m)   Body mass index is 25.23 kg/m. Ideal Body Weight: Weight in (lb) to have BMI = 25: 183.9   GEN: WDWN, NAD, Non-toxic, Alert & Oriented x 3 HEENT: Atraumatic, Normocephalic.   Ears and Nose: No external deformity. EXTR: No clubbing/cyanosis/edema NEURO: Normal gait.  PSYCH: Normally interactive. Conversant. Not depressed or anxious appearing.  Calm demeanor.    Assessment and Plan: Chronic back pain, unspecified back location, unspecified  back pain laterality  Medication monitoring encounter - Plan: Pain Mgmt, Profile 8 w/Conf, U  UDS today Uncertain how to go forward with [name].  Will look for his UDS but likely will not be able to go back up on  his hydrocodone as requested  Signed Abbe Amsterdam, MD  10/8- received his UDS as below: I am glad to see it is negative for alcohol which does corroborate his history of drinking very lightly Called him.  I am able to change his hydrocodone rx back to 7.5 mg, 70 per month,  Will do this now for him He needs to see me in 8 weeks Again emphasized the importance of abstaining from alcohol and he will continue to try   Results for orders placed or performed in visit on 09/13/18  Pain Mgmt, Profile 8 w/Conf, U  Result Value Ref Range   Creatinine 79.3 > or = 20. mg/dL   pH 1.61 4.5 - 9.0   Oxidant NEGATIVE <200 mcg/mL   Amphetamines NEGATIVE <500 ng/mL   medMATCH Amphetamines CONSISTENT    Benzodiazepines POSITIVE (A) <100 ng/mL   Alphahydroxyalprazolam 31 (H) <25 ng/mL   medMATCH aOH alprazolam INCONSISTENT    Alphahydroxymidazolam NEGATIVE <50 ng/mL   medMATCH aOH midazolam CONSISTENT    Alphahydroxytriazolam NEGATIVE <50 ng/mL   medMATCH aOH triazolam CONSISTENT    Aminoclonazepam NEGATIVE <25 ng/mL   medMATCH Aminoclonazepam CONSISTENT    Hydroxyethylflurazepam NEGATIVE <50 ng/mL   medMATCH OH,Et flurazepam CONSISTENT    Lorazepam NEGATIVE <50 ng/mL   medMATCH Lorazepam CONSISTENT    Nordiazepam NEGATIVE <50 ng/mL   medMATCH Nordiazepam CONSISTENT    Oxazepam NEGATIVE <50 ng/mL   medMATCH Oxazepam CONSISTENT    Temazepam NEGATIVE <50 ng/mL   medMATCH Temazepam CONSISTENT    Marijuana  Metabolite NEGATIVE <20 ng/mL   medMATCH Marijuana Metab CONSISTENT    Cocaine Metabolite NEGATIVE <150 ng/mL   medMATCH Cocaine Metab CONSISTENT    Opiates POSITIVE (A) <100 ng/mL   Codeine NEGATIVE <50 ng/mL   medMATCH Codeine CONSISTENT    Hydrocodone 215 (H) <50 ng/mL   medMATCH Hydrocodone INCONSISTENT    Hydromorphone 80 (H) <50 ng/mL   medMATCH Hydromorphone INCONSISTENT    Morphine NEGATIVE <50 ng/mL   medMATCH Morphine CONSISTENT    Norhydrocodone 301 (H) <50 ng/mL   medMATCH Norhydrocodone INCONSISTENT    Oxycodone NEGATIVE <100 ng/mL   medMATCH Oxycodone CONSISTENT    Buprenorphine, Urine NEGATIVE <5 ng/mL   medMATCH Buprenorphine CONSISTENT    MDMA NEGATIVE <500 ng/mL   Brand [facility] MDMA CONSISTENT    Alcohol Metabolites NEGATIVE <500 ng/mL   medMATCH Alcohol Metab CONSISTENT    6 Acetylmorphine NEGATIVE <10 ng/mL   medMATCH 6 Acetylmorphine CONSISTENT    NCCSR

## 2018-09-17 ENCOUNTER — Telehealth: Payer: Self-pay | Admitting: Family Medicine

## 2018-09-17 ENCOUNTER — Other Ambulatory Visit: Payer: Self-pay | Admitting: Family Medicine

## 2018-09-17 NOTE — Telephone Encounter (Signed)
Copied from CRM [id number]. Topic: General - Other >> Sep 17, 2018  1:02 PM Gerrianne Scale wrote: Reason for CRM: pt calling about urine sample results that he did

## 2018-09-17 NOTE — Telephone Encounter (Signed)
Have not gotten results back yet. Will let patient know when results are back.

## 2018-09-17 NOTE — Telephone Encounter (Signed)
He is on this med, he got 70 pills on 9/19 I am waiting on his UDS prior to any further refills  Please let him know that his UDS is not back yet

## 2018-09-17 NOTE — Telephone Encounter (Signed)
Copied from CRM [id number]. Topic: Quick Communication - Rx Refill/Question >> Sep 17, 2018  1:06 PM Gloriann Loan L wrote: Medication:  oxyCODONE (Oxy IR/ROXICODONE) immediate release tablet 5 mg  To 7.5 mg per pt        [id number] [place], [place] - [address] [phone] (Phone) [phone] (Fax)      Has the patient contacted their pharmacy? No.  Pt waiting on urine test per Dr Patsy Lager (Agent: If no, request that the patient contact the pharmacy for the refill.) (Agent: If yes, when and what did the pharmacy advise?)   Preferred Pharmacy (with phone number or street name):     [id number] [place], [place] - [address] [phone] (Phone) [phone] (Fax)    Agent: Please be advised that RX refills may take up to 3 business days. We ask that you follow-up with your pharmacy.

## 2018-09-17 NOTE — Telephone Encounter (Signed)
Pt requesting oxycodone immediate release tablet 5 mg to 7.5 mg.  [id number]  PCP:  Shanda Bumps [name]  Do not see where he is on this medication.    Pt waiting on urine test per Dr. Patsy Lager

## 2018-09-18 LAB — PAIN MGMT, PROFILE 8 W/CONF, U
6 ACETYLMORPHINE: NEGATIVE ng/mL (ref <10)
ALPHAHYDROXYMIDAZOLAM: NEGATIVE ng/mL (ref <50)
AMINOCLONAZEPAM: NEGATIVE ng/mL (ref <25)
AMPHETAMINES: NEGATIVE ng/mL (ref <500)
Alcohol Metabolites: NEGATIVE ng/mL (ref <500)
Alphahydroxyalprazolam: 31 ng/mL — ABNORMAL HIGH (ref <25)
Alphahydroxytriazolam: NEGATIVE ng/mL (ref <50)
BENZODIAZEPINES: POSITIVE ng/mL — AB (ref <100)
Buprenorphine, Urine: NEGATIVE ng/mL (ref <5)
CODEINE: NEGATIVE ng/mL (ref <50)
Cocaine Metabolite: NEGATIVE ng/mL (ref <150)
Creatinine: 79.3 mg/dL
Hydrocodone: 215 ng/mL — ABNORMAL HIGH (ref <50)
Hydromorphone: 80 ng/mL — ABNORMAL HIGH (ref <50)
Hydroxyethylflurazepam: NEGATIVE ng/mL (ref <50)
Lorazepam: NEGATIVE ng/mL (ref <50)
MDMA: NEGATIVE ng/mL (ref <500)
Marijuana Metabolite: NEGATIVE ng/mL (ref <20)
Morphine: NEGATIVE ng/mL (ref <50)
NORDIAZEPAM: NEGATIVE ng/mL (ref <50)
Norhydrocodone: 301 ng/mL — ABNORMAL HIGH (ref <50)
OPIATES: POSITIVE ng/mL — AB (ref <100)
OXIDANT: NEGATIVE ug/mL (ref <200)
OXYCODONE: NEGATIVE ng/mL (ref <100)
Oxazepam: NEGATIVE ng/mL (ref <50)
Temazepam: NEGATIVE ng/mL (ref <50)
pH: 6.76 (ref 4.5–9.0)

## 2018-09-18 MED ORDER — HYDROCODONE-ACETAMINOPHEN 7.5-325 MG PO TABS: 1.0000 | ORAL_TABLET | Freq: Three times a day (TID) | ORAL | 0 refills | Status: DC | PRN

## 2018-09-18 NOTE — Addendum Note (Signed)
Addended by: Abbe Amsterdam C on: 09/18/2018 05:22 PM   Modules accepted: Orders

## 2018-09-25 ENCOUNTER — Telehealth: Payer: Self-pay | Admitting: Family Medicine

## 2018-09-25 NOTE — Telephone Encounter (Signed)
Patient was informed of referral to [facility]

## 2018-09-25 NOTE — Addendum Note (Signed)
Addended by: Kathi Simpers F on: 09/25/2018 01:05 PM   Modules accepted: Orders

## 2018-09-25 NOTE — Telephone Encounter (Signed)
Patient states he has not had any relief from the injection given on 10/1. He is back to having a lot of pain and would like to know what the next step should be

## 2018-09-25 NOTE — Telephone Encounter (Signed)
At this point would recommend neurology evaluation - ok to put in referral to evaluate for neuropathy.

## 2018-09-28 ENCOUNTER — Telehealth: Payer: Self-pay | Admitting: Family Medicine

## 2018-09-28 MED ORDER — GABAPENTIN 300 MG PO CAPS: 300.0000 mg | ORAL_CAPSULE | Freq: Three times a day (TID) | ORAL | 1 refills | Status: DC

## 2018-09-28 NOTE — Telephone Encounter (Signed)
-----   Message from Lizbeth Bark sent at 09/28/2018 12:13 PM EDT ----- Regarding: FW: question He does need a refill. ----- Message ----- From: Lenda Kelp, MD Sent: 09/28/2018  12:07 PM EDT To: Lizbeth Bark Subject: RE: question                                   I would ask him to go back to taking the gabapentin and hopefully increase to 300mg  three times a day.  Injection didn't do anything so I wouldn't repeat it.  If he needs more of the medicine let me know and I can send it in.  ----- Message ----- From: Lizbeth Bark Sent: 09/28/2018  10:25 AM EDT To: Lenda Kelp, MD Subject: question                                       Pt can't get into [facility] until 12/5. Pain is moving into another toe, wants to know if you can give him an injection or meds until his appt in dec.

## 2018-09-28 NOTE — Telephone Encounter (Signed)
Sent in for him.  Thanks!

## 2018-10-01 ENCOUNTER — Other Ambulatory Visit: Payer: Self-pay | Admitting: Family Medicine

## 2018-10-01 DIAGNOSIS — F4321 Adjustment disorder with depressed mood: Secondary | ICD-10-CM

## 2018-10-01 DIAGNOSIS — F411 Generalized anxiety disorder: Secondary | ICD-10-CM

## 2018-10-02 NOTE — Telephone Encounter (Signed)
Requesting:Alprazolam Contract:08/21/18 UDS:09/13/2018 Last Visit:09/13/2018 Next Visit:none Last Refill:08/30/18  Please Advise

## 2018-10-08 ENCOUNTER — Encounter: Payer: Self-pay | Admitting: Family Medicine

## 2018-10-10 ENCOUNTER — Other Ambulatory Visit: Payer: Self-pay | Admitting: Family Medicine

## 2018-10-10 ENCOUNTER — Telehealth: Payer: Self-pay | Admitting: Family Medicine

## 2018-10-10 DIAGNOSIS — I10 Essential (primary) hypertension: Secondary | ICD-10-CM

## 2018-10-10 MED ORDER — PREDNISONE 10 MG PO TABS: ORAL_TABLET | ORAL | 0 refills | Status: DC

## 2018-10-10 NOTE — Telephone Encounter (Signed)
Patient was informed. He has been taking 300mg  three times a day without relief. Informed patient that the next step is to increase to 600mg  three times a day. Patient still has plenty of gabapentin left and states he will start taking two 300mg  tablets three times a day.

## 2018-10-10 NOTE — Telephone Encounter (Signed)
We can do the 6 day course one more time - sent in.  Is he taking the gabapentin 300mg  three times a day?  If he's tolerating this it's still a low dose - next step would be to increase to 600mg  three times a day.

## 2018-10-10 NOTE — Telephone Encounter (Signed)
Great, thanks

## 2018-10-10 NOTE — Telephone Encounter (Signed)
Patient asking for a refill of prednisone to get him through until his visit with neurology in December. Patient states the prednisone helped with the pain and hasn't had relief with the gabapentin

## 2018-10-22 ENCOUNTER — Other Ambulatory Visit: Payer: Self-pay | Admitting: Family Medicine

## 2018-10-22 DIAGNOSIS — M545 Low back pain: Principal | ICD-10-CM

## 2018-10-22 DIAGNOSIS — G8929 Other chronic pain: Secondary | ICD-10-CM

## 2018-10-22 NOTE — Telephone Encounter (Signed)
Requested medication (s) are due for refill today: Yes  Requested medication (s) are on the active medication list: Yes  Last refill:  09/18/18  Future visit scheduled: No  Notes to clinic:  Unable to refill per protocol     Requested Prescriptions  Pending Prescriptions Disp Refills   HYDROcodone-acetaminophen (NORCO) 7.5-325 MG tablet 70 tablet 0    Sig: Take 1 tablet by mouth every 8 (eight) hours as needed for moderate pain.     Not Delegated - Analgesics:  Opioid Agonist Combinations Failed - 10/22/2018 11:23 AM      Failed - This refill cannot be delegated      Failed - Urine Drug Screen completed in last 360 days.      Passed - Valid encounter within last 6 months    Recent Outpatient Visits          1 month ago Chronic back pain, unspecified back location, unspecified back pain laterality   Holiday representative at [facility] [place], [place], MD   2 months ago Chronic idiopathic gout involving toe of right foot without tophus   Holiday representative at [facility] [name], [place] C, MD   4 months ago Chronic bilateral low back pain without sciatica   Holiday representative at [facility] [name], Gwenlyn Found, MD   8 months ago Non-intractable vomiting with nausea, unspecified vomiting type   Holiday representative at [facility] [name], Gwenlyn Found, MD   1 year ago Acute bronchitis, unspecified organism   Holiday representative at [facility], Gwenlyn Found, MD

## 2018-10-22 NOTE — Telephone Encounter (Signed)
Copied from CRM [id number]. Topic: Quick Communication - Rx Refill/Question >> Oct 22, 2018 10:14 AM Angela Nevin wrote: Medication: HYDROcodone-acetaminophen (NORCO) 7.5-325 MG tablet  Patient is requesting refill of this medication.     Preferred Pharmacy (with phone number or street name):[id number] [place], [place] - [address] [phone] (Phone) [phone] (Fax)

## 2018-10-23 MED ORDER — HYDROCODONE-ACETAMINOPHEN 7.5-325 MG PO TABS: 1.0000 | ORAL_TABLET | Freq: Three times a day (TID) | ORAL | 0 refills | Status: DC | PRN

## 2018-10-23 NOTE — Telephone Encounter (Signed)
Reviewed NCCSR- ok to refill  

## 2018-11-15 ENCOUNTER — Encounter

## 2018-11-15 ENCOUNTER — Ambulatory Visit (INDEPENDENT_AMBULATORY_CARE_PROVIDER_SITE_OTHER): Payer: BLUE CROSS/BLUE SHIELD | Admitting: Neurology

## 2018-11-15 ENCOUNTER — Encounter: Payer: Self-pay | Admitting: Neurology

## 2018-11-15 VITALS — BP 156/86 | HR 107 | Ht 72.0 in | Wt 202.0 lb

## 2018-11-15 DIAGNOSIS — M79674 Pain in right toe(s): Secondary | ICD-10-CM | POA: Diagnosis not present

## 2018-11-15 DIAGNOSIS — M79675 Pain in left toe(s): Secondary | ICD-10-CM

## 2018-11-15 DIAGNOSIS — F1910 Other psychoactive substance abuse, uncomplicated: Secondary | ICD-10-CM | POA: Diagnosis not present

## 2018-11-15 DIAGNOSIS — R200 Anesthesia of skin: Secondary | ICD-10-CM | POA: Diagnosis not present

## 2018-11-15 DIAGNOSIS — M79676 Pain in unspecified toe(s): Secondary | ICD-10-CM

## 2018-11-15 DIAGNOSIS — G629 Polyneuropathy, unspecified: Secondary | ICD-10-CM

## 2018-11-15 DIAGNOSIS — F1020 Alcohol dependence, uncomplicated: Secondary | ICD-10-CM

## 2018-11-16 ENCOUNTER — Other Ambulatory Visit: Payer: Self-pay | Admitting: Family Medicine

## 2018-11-19 ENCOUNTER — Other Ambulatory Visit: Payer: Self-pay | Admitting: *Deleted

## 2018-11-19 ENCOUNTER — Encounter: Payer: Self-pay | Admitting: Neurology

## 2018-11-19 DIAGNOSIS — F1020 Alcohol dependence, uncomplicated: Secondary | ICD-10-CM | POA: Insufficient documentation

## 2018-11-19 MED ORDER — GABAPENTIN 300 MG PO CAPS: 300.0000 mg | ORAL_CAPSULE | Freq: Three times a day (TID) | ORAL | 1 refills | Status: DC

## 2018-11-19 MED ORDER — GABAPENTIN 300 MG PO CAPS: ORAL_CAPSULE | ORAL | 1 refills | Status: DC

## 2018-11-19 NOTE — Progress Notes (Signed)
[facility]    Provider:  Dr Lucia GaskinsAhern Referring Provider: Norton BlizzardShane Hudnall MD Primary Care Physician:  Pearline Cablesopland, Jessica C, MD  CC:  Toe pain  HPI:  Jonathan Harrington is a 62 y.o. male here as requested by Dr. Pearletha ForgeHudnall for acute toe pain. PMHX alcohol abuse, current heavy smoker, hypertension, gout, anxiety.  His toe started getting sore several months ago and was diagnosed with another episode of gout. Swelled. Tried colchicine, allopurinol, prednisone. Then it moved into the left toe. A shot helped. Worse at night. He is getting better now. He can sleep. It comes and goes. No inciting event. Now he is feeling it in some of his other toes. Would be accompanied by swelling and redness. His toes have been numb for years. He smokes 2 packs a day. Feet have been numb for years. Not diabetic. No color changes. Numbness stable. He has a hx of heavy alcohol use and smokes 2 packs a day. No weakness. He drank a gallon of whiskey every day for decades. He has stopped drinking alcohol. Needs to be evaluate for PAD/PVD. He drank excessively for decades a gallon of whiskey a day. He also still smokes. No other focal neurologic deficits, associated symptoms, inciting events or modifiable factors.  Reviewed notes, labs and imaging from outside physicians, which showed: reviewed XR foot images 07/2018 and agree with the following: FINDINGS: No acute fracture dislocation. No significant degenerative change. There is a 5 mm linear density over the soft tissues adjacent the posterior/plantar aspect of the calcaneus possibly representing a foreign body versus nonspecific soft tissue calcification.  IMPRESSION: No acute findings.  Tiny 5 mm linear foreign body versus nonspecific soft tissue calcification adjacent the posterior/plantar aspect of the Calcaneus.  Reviewed Dr. Durel Saltsopeland's notes.  Last seen October 2019 at that time she was treating him for alcohol use and tapering his pain medication and  Xanax.  He has significant joint pains.  Is also having significant toe pain, treated by Dr. Pearletha ForgeHudnall earlier this week and they did an MTP injection for him.  Review Dr. Lazaro ArmsHudnall's notes.  He was last seen September 11, 2018 where he reported acute onset of right great toe soreness worse at night associated warmth and redness.  Could hardly stand it when the sheets touched his toe.  And the symptoms started in his left great toe and spread.  Pain severe 8 out of 10.  He does have underlying neuropathy.  Was also treated for gout in the past.  Was given prednisone, colchicine, allopurinal, MTP injectons and is improving.  Review of Systems: Patient complains of symptoms per HPI as well as the following symptom: toe pain, numbness in feet. Pertinent negatives and positives per HPI. All others negative.   Social History   Socioeconomic History  . Marital status: Widowed    Spouse name: died 03/09/2016  . Number of children: 1  . Years of education: Not on file  . Highest education level: Not on file  Occupational History  . Occupation: IT consultantscissor inspector  Social Needs  . Financial resource strain: Not on file  . Food insecurity:    Worry: Not on file    Inability: Not on file  . Transportation needs:    Medical: Not on file    Non-medical: Not on file  Tobacco Use  . Smoking status: Current Every Day Smoker    Packs/day: 1.50  . Smokeless tobacco: Never Used  Substance and Sexual Activity  . Alcohol use: Yes  Alcohol/week: 21.0 standard drinks    Types: 21 Cans of beer per week    Comment: about 3 beers daily- history of heavy liquor abuse  . Drug use: No  . Sexual activity: Not on file  Lifestyle  . Physical activity:    Days per week: Not on file    Minutes per session: Not on file  . Stress: Not on file  Relationships  . Social connections:    Talks on phone: Not on file    Gets together: Not on file    Attends religious service: Not on file    Active member of club or  organization: Not on file    Attends meetings of clubs or organizations: Not on file    Relationship status: Not on file  . Intimate partner violence:    Fear of current or ex partner: Not on file    Emotionally abused: Not on file    Physically abused: Not on file    Forced sexual activity: Not on file  Other Topics Concern  . Not on file  Social History Narrative   Lives alone following the death of his wife 2016-03-20 following 42 years of marriage.   His son lives in the [place] area, and visits on weekends.    Family History  Problem Relation Age of Onset  . Alzheimer's disease Mother   . Cancer Sister   . Neuropathy Neg Hx     Past Medical History:  Diagnosis Date  . Alcoholism (HCC)   . Anxiety   . Diverticulitis 2011   resulted in partial colectomy  . Essential hypertension 06/06/2016  . Full dentures   . Gout   . Hematuria   . Wears glasses     Past Surgical History:  Procedure Laterality Date  . COLON SURGERY  2011   partial colectomy  . COLONOSCOPY    . FINGER ARTHROPLASTY  1990   rt index fx  . HERNIA REPAIR    . INGUINAL HERNIA REPAIR Left 05/27/2014   Procedure: LEFT INGUINAL HERNIA REPAIR WITH MESH;  Surgeon: Wilmon Arms. Corliss Skains, MD;  Location: [facility];  Service: General;  Laterality: Left;  . INSERTION OF MESH Left 05/27/2014   Procedure: INSERTION OF MESH;  Surgeon: Wilmon Arms. Corliss Skains, MD;  Location:  SURGERY CENTER;  Service: General;  Laterality: Left;    Current Outpatient Medications  Medication Sig Dispense Refill  . allopurinol (ZYLOPRIM) 300 MG tablet Take 1 tablet (300 mg total) by mouth daily. 30 tablet 6  . ALPRAZolam (XANAX) 0.25 MG tablet TAKE 1/2 OR 1 TABLET ONCE OR TWICE A DAY AS NEEDED FOR ANXIETY. NOTE THIS RX IS FOR 45 PILLS 45 tablet 1  . amLODipine (NORVASC) 10 MG tablet Take 1 tablet (10 mg total) by mouth daily. 90 tablet 3  . diphenhydrAMINE HCl, Sleep, (SLEEP AID) 25 MG CAPS Take by mouth.    .  Doxylamine Succinate, Sleep, (SLEEP AID PO) Take 50 mg by mouth at bedtime.    . gabapentin (NEURONTIN) 300 MG capsule Take 1 capsule (300 mg total) by mouth 3 (three) times daily. 90 capsule 1  . HYDROcodone-acetaminophen (NORCO) 7.5-325 MG tablet Take 1 tablet by mouth every 8 (eight) hours as needed for moderate pain. 70 tablet 0  . lisinopril (PRINIVIL,ZESTRIL) 40 MG tablet TAKE 1 TABLET BY MOUTH EVERY DAY 90 tablet 3  . Multiple Vitamins-Minerals (MULTIVITAMIN WITH MINERALS) tablet Take 1 tablet by mouth daily. Reported on 03/21/2016    .  rosuvastatin (CRESTOR) 10 MG tablet Take 1 tablet (10 mg total) by mouth daily. 90 tablet 3   No current facility-administered medications for this visit.     Allergies as of 11/15/2018 - Review Complete 11/15/2018  Allergen Reaction Noted  . Ibuprofen Swelling 12/29/2011    Vitals: BP (!) 156/86   Pulse (!) 107   Ht 6' (1.829 m)   Wt 202 lb (91.6 kg)   BMI 27.40 kg/m  Last Weight:  Wt Readings from Last 1 Encounters:  11/15/18 202 lb (91.6 kg)   Last Height:   Ht Readings from Last 1 Encounters:  11/15/18 6' (1.829 m)    Physical exam: Exam: Gen: NAD, conversant, well nourised, well groomed                     CV: RRR, no MRG. No Carotid Bruits. No peripheral edema, warm, nontender Eyes: Conjunctivae clear without exudates or hemorrhage  Neuro: Detailed Neurologic Exam  Speech:    Speech is normal; fluent and spontaneous with normal comprehension.  Cognition:    The patient is oriented to person, place, and time; slightly tangential    recent and remote memory intact;     language fluent;     normal attention, concentration,     fund of knowledge Cranial Nerves:    The pupils are equal, round, and reactive to light.  Attempted for endoscopy could not visualize. Visual fields are full to finger confrontation. Extraocular movements are intact. Trigeminal sensation is intact and the muscles of mastication are normal. The face is  symmetric. The palate elevates in the midline. Hearing intact. Voice is normal. Shoulder shrug is normal. The tongue has normal motion without fasciculations.   Coordination:    No dysmetria.   Gait:    Normal native gait  Motor Observation:    No asymmetry, no atrophy, and no involuntary movements noted. Tone:    Normal muscle tone.    Posture:    Posture is normal. normal erect    Strength:    Strength is V/V in the upper and lower limbs.      Sensation: decreased to all modalities to the knees. Intact uppers.     Reflex Exam:  DTR's:    Absent AJs otherwise deep tendon reflexes in the upper and lower extremities are normal bilaterally.   Toes:    The toes are downgoing bilaterally.   Clonus:    Clonus is absent.      Assessment/Plan: Jonathan Harrington is a 62 y.o. male here as requested by Dr. Pearletha Forge for acute toe pain. PMHX alcohol abuse, current heavy smoker, hypertension, gout, anxiety. Here for acute onset of right great toe soreness worse at night associated warmth and redness.  Could hardly stand it when the sheets touched his toe.  And the symptoms started in his left great toe and spread.  Pain severe 8 out of 10.  He does have underlying neuropathy.  Was also treated for gout in the past.  Was given prednisone, colchicine, allopurinal, MTP injectons and is improving.  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 -His acute onset toe pain is very unlikely primarily due to his polyneuropathy, he has been treated for gout and is feeling better -We will however perform a complete polyneuropathy serum evaluation for any other causes.  Orders Placed This Encounter  Procedures  . B. burgdorfi Antibody  . Methylmalonic acid, serum  . Hemoglobin A1c  .  TSH  . HIV Antibody (routine testing w rflx)  . ANA w/Reflex  . Sjogren's syndrome antibods(ssa + ssb)  . B12 and Folate Panel  . Sjogren's syndrome antibods(ssa + ssb)    . Hepatitis C antibody  . Rheumatoid factor  . Heavy metals, blood  . Vitamin B6  . Vitamin B1     Cc: Norton Blizzard MD   Naomie Dean, MD  [facility] [address] [place], [place] [address]  Phone [phone] Fax [phone]

## 2018-11-20 ENCOUNTER — Telehealth: Payer: Self-pay | Admitting: Neurology

## 2018-11-20 NOTE — Telephone Encounter (Signed)
Pt has called stating he was told he would be contacted for the scheduling of some test, he has not heard from anyone and is asking for a call from RN

## 2018-11-20 NOTE — Telephone Encounter (Signed)
I called pt to get some more information. He was unable to get labwork done last Thursday since our computer system was down. Advised I see lab orders are in and he can come during regular business hours to get this done. Advised him to avoid between 12-1pm because our lab tech normally takes lunch around this time. He verbalized understanding and appreciation.

## 2018-11-26 ENCOUNTER — Other Ambulatory Visit: Payer: Self-pay | Admitting: Family Medicine

## 2018-11-26 DIAGNOSIS — M545 Low back pain, unspecified: Secondary | ICD-10-CM

## 2018-11-26 DIAGNOSIS — G8929 Other chronic pain: Secondary | ICD-10-CM

## 2018-11-26 MED ORDER — HYDROCODONE-ACETAMINOPHEN 7.5-325 MG PO TABS: 1.0000 | ORAL_TABLET | Freq: Three times a day (TID) | ORAL | 0 refills | Status: DC | PRN

## 2018-11-26 NOTE — Telephone Encounter (Signed)
Last seen in October UDS  10/19  NCCSR:  10/31/2018  1   10/02/2018  Alprazolam 0.25 Mg Tablet  45.00 30 Je Cop  [id number]  Nor (2372)  1/1 0.75 LME Comm Ins  [place]  10/23/2018  1   10/23/2018  Hydrocodone-Acetamin 7.5-325  70.00 23 Je Cop  [id number]  Nor (2372)  0/0 22.83 MME Comm Ins  [place]  10/02/2018  1   10/02/2018  Alprazolam 0.25 Mg Tablet  45.00 30 Je Cop  [id number]  [place] (2372)  0/1 0.75 LME Comm Ins  [place]  09/18/2018  1   09/18/2018  Hydrocodone-Acetamin 7.5-325  70.00 23 Je Cop  [id number]  Nor (2372)  0/0 22.83 MME Comm Ins  [place]  08/30/2018  1   08/30/2018  Alprazolam 0.25 Mg Tablet  45.00 30 Je Cop  [id number]  Nor (2372)  0/0 0.75 LME Comm Ins  [place]  08/30/2018  1   08/30/2018  Hydrocodone-Acetamin 5-325 Mg  70.00 24 Je Cop  [id number]  Nor (2372)  0/0 14.58 MME Comm Ins  [place]  07/31/2018  1   07/31/2018  Hydrocodone-Acetamin 7.5-325  70.00 25 Je Cop  [id number]  Nor (2372)  0/0      Ok to refill

## 2018-11-26 NOTE — Telephone Encounter (Signed)
Copied from CRM [id number]. Topic: Quick Communication - Rx Refill/Question >> Nov 26, 2018 10:19 AM Wyonia HoughJohnson, Chaz E wrote: Medication: HYDROcodone-acetaminophen (NORCO) 7.5-325 MG tablet  Has the patient contacted their pharmacy? Yes.   No refills  Preferred Pharmacy (with phone number or street name): [id number] [place] - [address] [phone] (Phone) [phone] (Fax)    Agent: Please be advised that RX refills may take up to 3 business days. We ask that you follow-up with your pharmacy.

## 2018-11-28 ENCOUNTER — Other Ambulatory Visit: Payer: Self-pay | Admitting: Family Medicine

## 2018-11-28 DIAGNOSIS — F411 Generalized anxiety disorder: Secondary | ICD-10-CM

## 2018-11-28 DIAGNOSIS — F4321 Adjustment disorder with depressed mood: Secondary | ICD-10-CM

## 2018-11-29 NOTE — Telephone Encounter (Signed)
Seen in October NCCSR: 11/26/2018  1   11/26/2018  Hydrocodone-Acetamin 7.5-325  70.00 23 Je Cop  [id number]  Nor (2372)  0/0 22.83 MME Comm Ins  [place]  10/31/2018  1   10/02/2018  Alprazolam 0.25 Mg Tablet  45.00 30 Je Cop  [id number]  Nor (2372)  1/1 0.75 LME Comm Ins  [place]  10/23/2018  1   10/23/2018  Hydrocodone-Acetamin 7.5-325  70.00 23 Je Cop  [id number]  Nor (2372)  0/0 22.83 MME Comm Ins  [place]  10/02/2018  1   10/02/2018  Alprazolam 0.25 Mg Tablet  45.00 30 Je Cop  [id number]  Nor (2372)  0/1 0.75 LME Comm Ins  [place]  09/18/2018  1   09/18/2018  Hydrocodone-Acetamin 7.5-325  70.00 23 Je Cop  [id number]  Nor (2372)  0/0 22.83 MME Comm Ins  [place]  08/30/2018  1   08/30/2018  Alprazolam 0.25 Mg Tablet  45.00 30 Je Cop  [id number]  Nor (2372)  0/0 0.75 LME Comm Ins  [place]  08/30/2018  1   08/30/2018  Hydrocodone-Acetamin 5-325 Mg  70.00 24 Je Cop  [id number]  Nor (2372)  0/0 14.58 MME Comm Ins  [place]  07/31/2018  1   07/31/2018  Hydrocodone-Acetamin 7.5-325  70.00 25 Je Cop  [id number]  Nor (2372)  0/0 21.00 MME Comm Ins    07/31/2018  1   07/31/2018  Alprazolam 0.25 Mg Tablet  60.00 30 Je Cop  [id number]  Nor (2372)  0/0      tox  UTD Ok to refill

## 2018-12-25 ENCOUNTER — Other Ambulatory Visit: Payer: Self-pay | Admitting: Family Medicine

## 2018-12-25 DIAGNOSIS — G8929 Other chronic pain: Secondary | ICD-10-CM

## 2018-12-25 DIAGNOSIS — M545 Low back pain: Principal | ICD-10-CM

## 2018-12-25 MED ORDER — HYDROCODONE-ACETAMINOPHEN 7.5-325 MG PO TABS: 1.0000 | ORAL_TABLET | Freq: Three times a day (TID) | ORAL | 0 refills | Status: DC | PRN

## 2018-12-25 NOTE — Telephone Encounter (Signed)
Copied from CRM [id number]. Topic: Quick Communication - Rx Refill/Question >> Dec 25, 2018 12:36 PM Marylen Ponto wrote: Medication: HYDROcodone-acetaminophen (NORCO) 7.5-325 MG tablet  Has the patient contacted their pharmacy? no  Preferred Pharmacy (with phone number or street name): [id number], [place] - [address] [phone] (Phone)  [phone] (Fax)  Agent: Please be advised that RX refills may take up to 3 business days. We ask that you follow-up with your pharmacy.

## 2018-12-25 NOTE — Telephone Encounter (Signed)
Last seen here in October NCCSR:  12/13/2018 1 11/29/2018 Alprazolam 0.25 Mg Tablet  45.00 30 Je Cop [id number] Nor (2372) 0/1 0.75 LME Comm Ins [place] 11/26/2018 1 11/26/2018 Hydrocodone-Acetamin 7.5-325  70.00 23 Je Cop [id number] Nor (2372) 0/0 22.83 MME Comm Ins [place] 10/31/2018 1 10/02/2018 Alprazolam 0.25 Mg Tablet  45.00 30 Je Cop [id number] Nor (2372) 1/1 0.75 LME Comm Ins [place] 10/23/2018 1 10/23/2018 Hydrocodone-Acetamin 7.5-325  70.00 23 Je Cop [id number] Nor (2372) 0/0 22.83 MME Comm Ins [place] 10/02/2018 1 10/02/2018 Alprazolam 0.25 Mg Tablet  45.00 30 Je Cop [id number] Nor (2372) 0/1 0.75 LME Comm Ins [place] 09/18/2018 1 09/18/2018 Hydrocodone-Acetamin 7.5-325  70.00 23 Je Cop [id number] Nor (2372) 0/0 22.83 MME Comm Ins [place] 08/30/2018 1 08/30/2018 Alprazolam 0.25 Mg Tablet  45.00 30 Je Cop [id number] Nor (2372) 0/0 0.75 LME Comm Ins [place] 08/30/2018 1 08/30/2018 Hydrocodone-Acetamin 5-325 Mg  70.00 24 Je Cop [id number] Nor (2372) 0/0 14.58 MME Comm Ins [place] 07/31/2018 1 07/31/2018 Hydrocodone-Acetamin 7.5-325  70.00 25 Je Cop [id number] Nor (2372) 0/0 21.00 MME Comm Ins [place] 07/31/2018 1 07/31/2018 Alprazolam 0.25 Mg Tablet  60.00 30 Je Cop [id number] Nor (2372) 0/0 1.00 LME Comm Ins [place] 07/01/2018 1 07/01/2018 Hydrocodone-Acetamin 10-325 Mg  70.00 23 Je Cop [id number] Nor (2372) 0/0 30.43 MME Comm Ins  07/01/2018 1 07/01/2018 Alprazolam 0.5 Mg Tablet  60.00 30 Je Cop [id number] Nor (2372) 0/0  Toxicology is up-to-date Okay to refill

## 2019-01-08 ENCOUNTER — Telehealth: Payer: Self-pay | Admitting: Family Medicine

## 2019-01-08 ENCOUNTER — Emergency Department (HOSPITAL_COMMUNITY)
Admission: EM | Admit: 2019-01-08 | Discharge: 2019-01-09 | Disposition: A | Discharge status: Home / Self Care | Payer: BLUE CROSS/BLUE SHIELD | Attending: Emergency Medicine | Admitting: Emergency Medicine

## 2019-01-08 ENCOUNTER — Encounter (HOSPITAL_COMMUNITY): Payer: Self-pay

## 2019-01-08 ENCOUNTER — Emergency Department (HOSPITAL_COMMUNITY): Payer: BLUE CROSS/BLUE SHIELD

## 2019-01-08 ENCOUNTER — Other Ambulatory Visit: Payer: Self-pay

## 2019-01-08 ENCOUNTER — Other Ambulatory Visit (INDEPENDENT_AMBULATORY_CARE_PROVIDER_SITE_OTHER): Payer: Self-pay

## 2019-01-08 DIAGNOSIS — I10 Essential (primary) hypertension: Secondary | ICD-10-CM | POA: Insufficient documentation

## 2019-01-08 DIAGNOSIS — F17200 Nicotine dependence, unspecified, uncomplicated: Secondary | ICD-10-CM | POA: Diagnosis not present

## 2019-01-08 DIAGNOSIS — R200 Anesthesia of skin: Secondary | ICD-10-CM

## 2019-01-08 DIAGNOSIS — R109 Unspecified abdominal pain: Secondary | ICD-10-CM | POA: Diagnosis not present

## 2019-01-08 DIAGNOSIS — F1910 Other psychoactive substance abuse, uncomplicated: Secondary | ICD-10-CM | POA: Diagnosis not present

## 2019-01-08 DIAGNOSIS — M79676 Pain in unspecified toe(s): Secondary | ICD-10-CM

## 2019-01-08 DIAGNOSIS — R1031 Right lower quadrant pain: Secondary | ICD-10-CM | POA: Diagnosis present

## 2019-01-08 DIAGNOSIS — J441 Chronic obstructive pulmonary disease with (acute) exacerbation: Secondary | ICD-10-CM | POA: Diagnosis not present

## 2019-01-08 DIAGNOSIS — G629 Polyneuropathy, unspecified: Secondary | ICD-10-CM | POA: Diagnosis not present

## 2019-01-08 DIAGNOSIS — N201 Calculus of ureter: Secondary | ICD-10-CM | POA: Insufficient documentation

## 2019-01-08 DIAGNOSIS — Z79899 Other long term (current) drug therapy: Secondary | ICD-10-CM | POA: Insufficient documentation

## 2019-01-08 DIAGNOSIS — Z0289 Encounter for other administrative examinations: Secondary | ICD-10-CM

## 2019-01-08 DIAGNOSIS — N200 Calculus of kidney: Secondary | ICD-10-CM | POA: Diagnosis not present

## 2019-01-08 DIAGNOSIS — F411 Generalized anxiety disorder: Secondary | ICD-10-CM

## 2019-01-08 DIAGNOSIS — F4321 Adjustment disorder with depressed mood: Secondary | ICD-10-CM

## 2019-01-08 DIAGNOSIS — R0602 Shortness of breath: Secondary | ICD-10-CM | POA: Insufficient documentation

## 2019-01-08 DIAGNOSIS — K5730 Diverticulosis of large intestine without perforation or abscess without bleeding: Secondary | ICD-10-CM | POA: Diagnosis not present

## 2019-01-08 LAB — COMPREHENSIVE METABOLIC PANEL
ALT: 17 U/L (ref 0–44)
AST: 23 U/L (ref 15–41)
Albumin: 4.5 g/dL (ref 3.5–5.0)
Alkaline Phosphatase: 44 U/L (ref 38–126)
Anion gap: 8 (ref 5–15)
BUN: 13 mg/dL (ref 8–23)
CO2: 23 mmol/L (ref 22–32)
Calcium: 9.3 mg/dL (ref 8.9–10.3)
Chloride: 107 mmol/L (ref 98–111)
Creatinine, Ser: 0.97 mg/dL (ref 0.61–1.24)
GFR calc Af Amer: >60 mL/min (ref >60)
GFR calc non Af Amer: >60 mL/min (ref >60)
Glucose, Bld: 101 mg/dL — ABNORMAL HIGH (ref 70–99)
Potassium: 4.9 mmol/L (ref 3.5–5.1)
Sodium: 138 mmol/L (ref 135–145)
Total Bilirubin: 0.6 mg/dL (ref 0.3–1.2)
Total Protein: 7.6 g/dL (ref 6.5–8.1)

## 2019-01-08 LAB — I-STAT TROPONIN, ED: Troponin i, poc: 0 ng/mL (ref 0.00–0.08)

## 2019-01-08 LAB — CBC
HEMATOCRIT: 38.1 % — AB (ref 39.0–52.0)
Hemoglobin: 12.4 g/dL — ABNORMAL LOW (ref 13.0–17.0)
MCH: 32.4 pg (ref 26.0–34.0)
MCHC: 32.5 g/dL (ref 30.0–36.0)
MCV: 99.5 fL (ref 80.0–100.0)
Platelets: 170 10*3/uL (ref 150–400)
RBC: 3.83 MIL/uL — ABNORMAL LOW (ref 4.22–5.81)
RDW: 13.8 % (ref 11.5–15.5)
WBC: 9.6 10*3/uL (ref 4.0–10.5)
nRBC: 0 % (ref 0.0–0.2)

## 2019-01-08 MED ORDER — HYDROMORPHONE HCL 1 MG/ML IJ SOLN
1.0000 mg | Freq: Once | INTRAMUSCULAR | Status: AC
  Administered 2019-01-08: 1 mg via INTRAVENOUS
  Filled 2019-01-08: qty 1

## 2019-01-08 MED ORDER — ALPRAZOLAM 0.25 MG PO TABS: ORAL_TABLET | ORAL | 1 refills | Status: DC

## 2019-01-08 NOTE — ED Notes (Signed)
Bed: WA06 Expected date:  Expected time:  Means of arrival:  Comments: 63 yr old flank pain

## 2019-01-08 NOTE — Telephone Encounter (Signed)
Copied from CRM [id number]. Topic: Quick Communication - Rx Refill/Question >> Jan 08, 2019 11:46 AM Zada Girt, Lumin L wrote: Medication: ALPRAZolam (XANAX) 0.25 MG tablet (patient wants to go back to 1mg  and if not at least .5mg  because .25 is not working for him)  Has the patient contacted their pharmacy? Yes.   (Agent: If no, request that the patient contact the pharmacy for the refill.) (Agent: If yes, when and what did the pharmacy advise?)  Preferred Pharmacy (with phone number or street name): [id number], [place] - [address] [place] [address] Phone: [phone] Fax: [phone]  Agent: Please be advised that RX refills may take up to 3 business days. We ask that you follow-up with your pharmacy.

## 2019-01-08 NOTE — ED Provider Notes (Signed)
[facility]-EMERGENCY DEPT Provider Note   CSN: [id number] Arrival date & time: 01/08/19  2125     History   Chief Complaint Chief Complaint  Patient presents with  . Flank Pain    HPI Jonathan Harrington is a 63 y.o. male and history of alcohol use disorder, and HTN who presents to the emergency department by EMS with a chief complaint of right flank pain.  The patient endorses sudden onset, constant, nonradiating right flank pain that began tonight.  He states the pain is severe and is crampy in nature.  He reports the pain significantly worsened about 15 minutes after it began, but started to improve about 30 minutes ago after receiving 50 mcg of fentanyl IV by EMS.  He reports that he took his home hydrocodone with no relief prior to calling EMS.  Pain is improved with sitting up and worse with laying flat.  Pain is not worse with eating, voiding, and there are no other known aggravating or alleviating factors.  The patient also notes approximately 2 to 3 weeks of bilateral lower extremity swelling, abdominal distention, and shortness of breath.  He reports that 2 to 3 weeks ago that his daily weight was 185-186 lbs, is increased to 201 lbs over the last few days.  He states that over the last few weeks that he can barely walk from his home to his car without becoming extremely short of breath.    He reports that he is also been feeling more fatigued and having difficulty staying awake at work over the last few weeks, which is unusual for him.  He reports he was diagnosed with nephrolithiasis approximately 12 years ago, but has never required an intervention.  He denies chest pain, chest tightness, palpitations, weakness, numbness, fever, headache, body aches, vomiting, nausea, dysuria, hematuria, melena, or hematochezia. He has a h/o of chronic diarrhea, but no recent changes.   He is a 2 pack/day smoker.  He reports that he drinks a couple of beers and/or wine  coolers daily.  He denies other recreational or illicit drug use.  He reports that he was started on gabapentin approximately 3 months ago for bilateral peripheral neuropathy.  No recent long travel.  No personal history of PE or DVT.   The history is provided by the patient. No language interpreter was used.    Past Medical History:  Diagnosis Date  . Alcoholism (HCC)   . Anxiety   . Diverticulitis 2011   resulted in partial colectomy  . Essential hypertension 06/06/2016  . Full dentures   . Gout   . Hematuria   . Wears glasses     Patient Active Problem List   Diagnosis Date Noted  . Alcoholism (HCC) 11/19/2018  . Essential hypertension 06/06/2016  . Smoker 05/12/2016  . Left inguinal hernia 05/23/2014  . Anxiety state, unspecified 09/17/2013  . Gout 01/17/2013  . DIVERTICULOSIS-COLON 08/13/2010  . DIVERTICULITIS, COLON 08/13/2010  . ABDOMINAL PAIN-LLQ 08/13/2010  . Nonspecific (abnormal) findings on radiological and other examination of body structure 08/13/2010  . PERSONAL HX COLONIC POLYPS 08/13/2010  . NONSPCIFC ABN FINDING RAD & OTH EXAM LUNG FIELD 08/13/2010    Past Surgical History:  Procedure Laterality Date  . COLON SURGERY  2011   partial colectomy  . COLONOSCOPY    . FINGER ARTHROPLASTY  1990   rt index fx  . HERNIA REPAIR    . INGUINAL HERNIA REPAIR Left 05/27/2014   Procedure: LEFT INGUINAL HERNIA REPAIR  WITH MESH;  Surgeon: Wilmon ArmsMatthew K. Corliss Skainssuei, MD;  Location: [facility];  Service: General;  Laterality: Left;  . INSERTION OF MESH Left 05/27/2014   Procedure: INSERTION OF MESH;  Surgeon: Wilmon ArmsMatthew K. Corliss Skainssuei, MD;  Location: [facility];  Service: General;  Laterality: Left;        Home Medications    Prior to Admission medications   Medication Sig Start Date End Date Taking? Authorizing Provider  allopurinol (ZYLOPRIM) 300 MG tablet Take 1 tablet (300 mg total) by mouth daily. 07/31/18   [name], [name], MD  ALPRAZolam Prudy Feeler(XANAX)  0.25 MG tablet Take 1/2 or 1 pill twice daily as needed for anxiety. 01/08/19   [name], [name], MD  amLODipine (NORVASC) 10 MG tablet Take 1 tablet (10 mg total) by mouth daily. 06/20/18   [name], [name], MD  diphenhydrAMINE HCl, Sleep, (SLEEP AID) 25 MG CAPS Take by mouth.    [provider]  Doxylamine Succinate, Sleep, (SLEEP AID PO) Take 50 mg by mouth at bedtime.    [provider]  gabapentin (NEURONTIN) 300 MG capsule Take two (2) capusules three (3) times a day. 11/19/18   Hudnall, Azucena FallenShane R, MD  HYDROcodone-acetaminophen (NORCO) 7.5-325 MG tablet Take 1 tablet by mouth every 8 (eight) hours as needed for moderate pain. 12/25/18   [name], [name], MD  lisinopril (PRINIVIL,ZESTRIL) 40 MG tablet TAKE 1 TABLET BY MOUTH EVERY DAY 10/10/18   [name], [name], MD  Multiple Vitamins-Minerals (MULTIVITAMIN WITH MINERALS) tablet Take 1 tablet by mouth daily. Reported on 03/21/2016    [provider]  rosuvastatin (CRESTOR) 10 MG tablet Take 1 tablet (10 mg total) by mouth daily. 08/01/18   [name], [name], MD  tamsulosin (FLOMAX) 0.4 MG CAPS capsule Take 1 capsule (0.4 mg total) by mouth daily after breakfast. 01/09/19   Anuradha Chabot A, PA-C    Family History Family History  Problem Relation Age of Onset  . Alzheimer's disease Mother   . Cancer Sister   . Neuropathy Neg Hx     Social History Social History   Tobacco Use  . Smoking status: Current Every Day Smoker    Packs/day: 2.00  . Smokeless tobacco: Never Used  Substance Use Topics  . Alcohol use: Yes    Alcohol/week: 21.0 standard drinks    Types: 21 Cans of beer per week    Comment: about 3 beers daily- history of heavy liquor abuse  . Drug use: No     Allergies   Ibuprofen   Review of Systems Review of Systems  Constitutional: Negative for appetite change, chills and fever.  HENT: Negative for sore throat.   Respiratory: Positive for shortness of breath. Negative for chest  tightness.   Cardiovascular: Positive for leg swelling. Negative for chest pain and palpitations.  Gastrointestinal: Positive for abdominal distention, abdominal pain and diarrhea (chronic). Negative for blood in stool, constipation, nausea and vomiting.  Genitourinary: Positive for flank pain. Negative for dysuria, hematuria, penile swelling, scrotal swelling and urgency.  Musculoskeletal: Negative for back pain, myalgias, neck pain and neck stiffness.  Skin: Negative for rash.  Allergic/Immunologic: Negative for immunocompromised state.  Neurological: Negative for dizziness, weakness, numbness and headaches.  Psychiatric/Behavioral: Negative for confusion.     Physical Exam Updated Vital Signs BP (!) 150/76   Pulse (!) 101   Temp 98.4 F (36.9 C) (Oral)   Resp 18   Ht 6' (1.829 m)   Wt 90.7 kg   SpO2  98%   BMI 27.12 kg/m   Physical Exam Vitals signs and nursing note reviewed.  Constitutional:      General: He is not in acute distress.    Appearance: He is well-developed. He is not ill-appearing or diaphoretic.  HENT:     Head: Normocephalic.     Mouth/Throat:     Pharynx: No posterior oropharyngeal erythema.  Eyes:     General: No scleral icterus.    Conjunctiva/sclera: Conjunctivae normal.  Neck:     Musculoskeletal: Neck supple.  Cardiovascular:     Rate and Rhythm: Regular rhythm. Tachycardia present.     Heart sounds: No murmur. No friction rub. No gallop.   Pulmonary:     Effort: Pulmonary effort is normal.     Breath sounds: No wheezing.     Comments: Bibasilar crackles.  Lung sounds are diminished bilaterally.  He has conversational dyspnea and takes a breath every 1-2 sentences, which increases to taking a breath every couple of words with exertion. Lungs are otherwise clear to auscultation bilaterally.  Abdominal:     General: There is distension.     Palpations: Abdomen is soft. There is no mass.     Tenderness: There is abdominal tenderness. There is no  right CVA tenderness, left CVA tenderness, guarding or rebound.     Hernia: No hernia is present.     Comments: Point tenderness palpation in the right lower flank, superior to the anterior superior iliac spine.  No CVA tenderness bilaterally.  Negative Murphy sign.  No tenderness over McBurney's point.  Abdomen is distended and moderately hard.  He is also diffusely tender to palpation throughout the abdomen.  Patient the abdomen causes worsening pain in the right flank.  No guarding.  Musculoskeletal:     Right lower leg: Edema present.     Left lower leg: Edema present.     Comments: 2+ lower extremity pitting edema bilaterally.  No overlying erythema or warmth.  Skin:    General: Skin is warm and dry.  Neurological:     Mental Status: He is alert.  Psychiatric:        Behavior: Behavior normal.      ED Treatments / Results  Labs (all labs ordered are listed, but only abnormal results are displayed) Labs Reviewed  CBC - Abnormal; Notable for the following components:      Result Value   RBC 3.83 (*)    Hemoglobin 12.4 (*)    HCT 38.1 (*)    All other components within normal limits  COMPREHENSIVE METABOLIC PANEL - Abnormal; Notable for the following components:   Glucose, Bld 101 (*)    All other components within normal limits  BRAIN NATRIURETIC PEPTIDE  URINALYSIS, ROUTINE W REFLEX MICROSCOPIC  I-STAT TROPONIN, ED  I-STAT TROPONIN, ED    EKG EKG Interpretation  Date/Time:  Tuesday January 08 2019 23:30:16 EST Ventricular Rate:  93 PR Interval:    QRS Duration: 103 QT Interval:  345 QTC Calculation: 430 R Axis:   65 Text Interpretation:  Sinus rhythm No significant change since last tracing Confirmed by Rochele Raring [phone]) on 01/08/2019 11:54:36 PM   Radiology Dg Chest 2 View  Result Date: 01/08/2019 CLINICAL DATA:  Shortness of breath EXAM: CHEST - 2 VIEW COMPARISON:  10/04/2017 FINDINGS: Heart is borderline in size. Bibasilar atelectasis or scarring. No  effusions. No acute bony abnormality. IMPRESSION: Bibasilar scarring or atelectasis.  No active disease. Electronically Signed   By: Charlett Nose M.D.  On: 01/08/2019 23:02   Ct Angio Chest Pe W And/or Wo Contrast  Result Date: 01/09/2019 CLINICAL DATA:  63 y/o M; right flank pain, shortness of breath, history of kidney stone. EXAM: CT ANGIOGRAPHY CHEST CT ABDOMEN AND PELVIS WITH CONTRAST TECHNIQUE: Multidetector CT imaging of the chest was performed using the standard protocol during bolus administration of intravenous contrast. Multiplanar CT image reconstructions and MIPs were obtained to evaluate the vascular anatomy. Multidetector CT imaging of the abdomen and pelvis was performed using the standard protocol during bolus administration of intravenous contrast. CONTRAST:  ISOVUE-370 IOPAMIDOL (ISOVUE-370) INJECTION 76% COMPARISON:  02/15/2018 CT abdomen and pelvis.  07/16/2018 CT chest. FINDINGS: CTA CHEST FINDINGS Cardiovascular: Normal heart size. No pericardial effusion. Severe coronary artery calcific atherosclerosis. Normal caliber thoracic aorta and main pulmonary artery. Mild aortic calcific atherosclerosis. Satisfactory opacification of the pulmonary arteries. Respiratory motion artifact. No pulmonary embolus identified. Mediastinum/Nodes: No enlarged mediastinal, hilar, or axillary lymph nodes. Coarsely calcified mediastinal lymph nodes. Thyroid gland, trachea, and esophagus demonstrate no significant findings. Lungs/Pleura: Stable 4 mm right upper lobe pulmonary nodule (series 5, image 74), stable 8 mm right middle lobe pulmonary nodule (series 5, image 90), and stable 8 mm nodule in right lower lobe (series 5, image 107). Platelike atelectasis in the lung bases. Paraseptal and centrilobular emphysema with upper lobe predominance. No consolidation or pneumothorax. Musculoskeletal: No chest wall abnormality. No acute or significant osseous findings. Review of the MIP images confirms the above  findings. CT ABDOMEN and PELVIS FINDINGS Hepatobiliary: No focal liver abnormality is seen. No gallstones, gallbladder wall thickening, or biliary dilatation. Stable 4 mm nodule at the fundus of the gallbladder (series 10, image 33). Pancreas: Unremarkable. No pancreatic ductal dilatation or surrounding inflammatory changes. Spleen: Numerous calcified splenic granuloma. Adrenals/Urinary Tract: Adrenal glands are unremarkable. Bilateral renal cysts measuring up to 20 mm in left interpolar kidney. No additional focal kidney lesion. The 6 mm stone previously in the lower pole of right kidney has migrated to the ureteropelvic junction (series 10, image 34). No associated pelvicaliectasis. Contrast passes the stone into the downstream ureter on the delayed phase. No additional urinary stone disease. Normal left ureter. Normal bladder. Stomach/Bowel: Stomach is within normal limits. Appendix appears normal. No evidence of bowel wall thickening, distention, or inflammatory changes. Colonic diverticulosis, no findings of acute diverticulitis. Vascular/Lymphatic: Aortic atherosclerosis. No enlarged abdominal or pelvic lymph nodes. Reproductive: Coarse prostatic calcifications. Other: No abdominal wall hernia or abnormality. No abdominopelvic ascites. Musculoskeletal: No fracture is seen. Review of the MIP images confirms the above findings. IMPRESSION: CTA chest: 1. No pulmonary embolus identified. 2. Severe coronary artery calcific atherosclerosis. Aortic Atherosclerosis (ICD10-I70.0). 3. Platelike atelectasis in the lung bases. CT abdomen and pelvis: 1. 6 mm stone previously in lower pole of right kidney has migrated to the ureteropelvic junction. No associated pelvicaliectasis and contrast passes the stone into the downstream ureter on the delayed phase. 2. Colonic diverticulosis, no findings of acute diverticulitis. 3. Stable 4 mm nodule at the gallbladder fundus, possibly a polyp. Electronically Signed   By: Mitzi Hansen M.D.   On: 01/09/2019 01:49   Ct Abdomen Pelvis W Contrast  Result Date: 01/09/2019 CLINICAL DATA:  63 y/o M; right flank pain, shortness of breath, history of kidney stone. EXAM: CT ANGIOGRAPHY CHEST CT ABDOMEN AND PELVIS WITH CONTRAST TECHNIQUE: Multidetector CT imaging of the chest was performed using the standard protocol during bolus administration of intravenous contrast. Multiplanar CT image reconstructions and MIPs were obtained to evaluate  the vascular anatomy. Multidetector CT imaging of the abdomen and pelvis was performed using the standard protocol during bolus administration of intravenous contrast. CONTRAST:  ISOVUE-370 IOPAMIDOL (ISOVUE-370) INJECTION 76% COMPARISON:  02/15/2018 CT abdomen and pelvis.  07/16/2018 CT chest. FINDINGS: CTA CHEST FINDINGS Cardiovascular: Normal heart size. No pericardial effusion. Severe coronary artery calcific atherosclerosis. Normal caliber thoracic aorta and main pulmonary artery. Mild aortic calcific atherosclerosis. Satisfactory opacification of the pulmonary arteries. Respiratory motion artifact. No pulmonary embolus identified. Mediastinum/Nodes: No enlarged mediastinal, hilar, or axillary lymph nodes. Coarsely calcified mediastinal lymph nodes. Thyroid gland, trachea, and esophagus demonstrate no significant findings. Lungs/Pleura: Stable 4 mm right upper lobe pulmonary nodule (series 5, image 74), stable 8 mm right middle lobe pulmonary nodule (series 5, image 90), and stable 8 mm nodule in right lower lobe (series 5, image 107). Platelike atelectasis in the lung bases. Paraseptal and centrilobular emphysema with upper lobe predominance. No consolidation or pneumothorax. Musculoskeletal: No chest wall abnormality. No acute or significant osseous findings. Review of the MIP images confirms the above findings. CT ABDOMEN and PELVIS FINDINGS Hepatobiliary: No focal liver abnormality is seen. No gallstones, gallbladder wall thickening,  or biliary dilatation. Stable 4 mm nodule at the fundus of the gallbladder (series 10, image 33). Pancreas: Unremarkable. No pancreatic ductal dilatation or surrounding inflammatory changes. Spleen: Numerous calcified splenic granuloma. Adrenals/Urinary Tract: Adrenal glands are unremarkable. Bilateral renal cysts measuring up to 20 mm in left interpolar kidney. No additional focal kidney lesion. The 6 mm stone previously in the lower pole of right kidney has migrated to the ureteropelvic junction (series 10, image 34). No associated pelvicaliectasis. Contrast passes the stone into the downstream ureter on the delayed phase. No additional urinary stone disease. Normal left ureter. Normal bladder. Stomach/Bowel: Stomach is within normal limits. Appendix appears normal. No evidence of bowel wall thickening, distention, or inflammatory changes. Colonic diverticulosis, no findings of acute diverticulitis. Vascular/Lymphatic: Aortic atherosclerosis. No enlarged abdominal or pelvic lymph nodes. Reproductive: Coarse prostatic calcifications. Other: No abdominal wall hernia or abnormality. No abdominopelvic ascites. Musculoskeletal: No fracture is seen. Review of the MIP images confirms the above findings. IMPRESSION: CTA chest: 1. No pulmonary embolus identified. 2. Severe coronary artery calcific atherosclerosis. Aortic Atherosclerosis (ICD10-I70.0). 3. Platelike atelectasis in the lung bases. CT abdomen and pelvis: 1. 6 mm stone previously in lower pole of right kidney has migrated to the ureteropelvic junction. No associated pelvicaliectasis and contrast passes the stone into the downstream ureter on the delayed phase. 2. Colonic diverticulosis, no findings of acute diverticulitis. 3. Stable 4 mm nodule at the gallbladder fundus, possibly a polyp. Electronically Signed   By: Mitzi Hansen M.D.   On: 01/09/2019 01:49    Procedures Procedures (including critical care time)  Medications Ordered in  ED Medications  sodium chloride (PF) 0.9 % injection (has no administration in time range)  iopamidol (ISOVUE-370) 76 % injection (has no administration in time range)  aerochamber Z-Stat Plus/medium 1 each (has no administration in time range)  HYDROmorphone (DILAUDID) injection 1 mg (1 mg Intravenous Given 01/08/19 2324)  iopamidol (ISOVUE-370) 76 % injection 100 mL (100 mLs Intravenous Contrast Given 01/09/19 0116)  HYDROmorphone (DILAUDID) injection 1 mg (1 mg Intravenous Given 01/09/19 0207)  albuterol (PROVENTIL) (2.5 MG/3ML) 0.083% nebulizer solution 5 mg (5 mg Nebulization Given 01/09/19 0250)  ipratropium (ATROVENT) nebulizer solution 0.5 mg (0.5 mg Nebulization Given 01/09/19 0250)  ketorolac (TORADOL) 30 MG/ML injection 30 mg (30 mg Intravenous Given 01/09/19 0248)  albuterol (PROVENTIL) (2.5  MG/3ML) 0.083% nebulizer solution 5 mg (5 mg Nebulization Given 01/09/19 0400)  albuterol (PROVENTIL HFA;VENTOLIN HFA) 108 (90 Base) MCG/ACT inhaler 2 puff (2 puffs Inhalation Given 01/09/19 0402)     Initial Impression / Assessment and Plan / ED Course  I have reviewed the triage vital signs and the nursing notes.  Pertinent labs & imaging results that were available during my care of the patient were reviewed by me and considered in my medical decision making (see chart for details).     63 year old male with a history of alcohol use disorder, and HTN setting with sudden onset right flank pain tonight with several weeks of weight gain, shortness of breath, lower extremity swelling, and abdominal distention.  The patient was seen and evaluated along with Dr. Elesa Massed, attending physician.  On exam, he has bibasilar crackles with diminished lung sounds bilaterally.  He appears short of breath..  He has reproducible point tenderness to the right flank and is also diffusely tender throughout the abdomen.  He reports a 15 pound weight gain over the last 3 weeks and has 2+ bilateral pitting edema in his  lower extremities.  EKG with sinus rhythm.  Troponin is unremarkable.  He is mildly tachycardic in the 100s on arrival and SaO2 is 93 to 94% on room air.  He is afebrile and mildly hypertensive.  Given the patient's shortness of breath, will order a CT to assure the patient does not have a PE as well as a scan his abdomen and pelvis.  Labs are otherwise reassuring.  No leukocytosis.  Urine is unremarkable.  Ridging is negative for PE.  He has severe coronary artery calcific atherosclerosis and platelike atelectasis in the lung bases.  He has a 6 mm stone at the UPJ.  There is no hydronephrosis.  There is a stable 4 mm nodule at the gallbladder fundus, possibly a polyp.  Discussed these findings with the patient.  He was given 2 albuterol nebulizer treatments in the ED and reported significant provement in his shortness of breath.  He was ambulated on pulse ox without hypoxia.  Will discharge with an albuterol spacer and inhaler for home use.  He will also be given Flomax and a urine strainer and will plan for a home attempt to pass ureteral calculus as well as a follow-up with urology.  All questions were answered.  The patient is agreeable with the plan at this time.  He has home pain medication which he can continue to take as directed.  Low suspicion for PE, ACS, appendicitis, cholecystitis, diverticulitis.  Strict return precautions given.  He is hemodynamically stable and in no acute distress.  He is safe for discharge to home with outpatient follow-up at this time.   Final Clinical Impressions(s) / ED Diagnoses   Final diagnoses:  COPD exacerbation (HCC)  Ureterolithiasis    ED Discharge Orders         Ordered    tamsulosin (FLOMAX) 0.4 MG CAPS capsule  Daily after breakfast     01/09/19 0444           Olivette Beckmann, Pedro Earls A, PA-C 01/09/19 0458    Ward, Layla Maw, DO 01/09/19 4098

## 2019-01-08 NOTE — Telephone Encounter (Signed)
Reviewed [facility] Patient last seen here in October I am not able to increase his alprazolam, as he is also using narcotics chronically and has history of alcohol abuse  12/25/2018  1   12/25/2018  Hydrocodone-Acetamin 7.5-325  70.00 23 Je Cop  [id number]  [place] (2372)  0/0 22.83 MME Comm Ins  [place]  12/13/2018  1   11/29/2018  Alprazolam 0.25 Mg Tablet  45.00 30 Je Cop  [id number]  [place] (2372)  0/1 0.75 LME Comm Ins  [place]  11/26/2018  1   11/26/2018  Hydrocodone-Acetamin 7.5-325  70.00 23 Je Cop  [id number]  [place] (2372)  0/0 22.83 MME Comm Ins  [place]  10/31/2018  1   10/02/2018  Alprazolam 0.25 Mg Tablet  45.00 30 Je Cop  [id number]  [place] (2372)  1/1 0.75 LME Comm Ins  [place]  10/23/2018  1   10/23/2018  Hydrocodone-Acetamin 7.5-325  70.00 23 Je Cop  [id number]  [place] (2372)  0/0 22.83 MME Comm Ins  [place]  10/02/2018  1   10/02/2018  Alprazolam 0.25 Mg Tablet  45.00 30 Je Cop  [id number]  [place] (2372)  0/1 0.75 LME Comm Ins  [place]  09/18/2018  1   09/18/2018  Hydrocodone-Acetamin 7.5-325  70.00 23 Je Cop  [id number]  [place] (2372)  0/0 22.83 MME Comm Ins  [place]  08/30/2018  1   08/30/2018  Alprazolam 0.25 Mg Tablet  45.00 30 Je Cop  [id number]  [place] (2372)  0/0 0.75 LME Comm Ins  [place]  08/30/2018  1   08/30/2018  Hydrocodone-Acetamin 5-325 Mg  70.00 24 Je Cop  [id number]  [place] (2372)  0/0 14.58 MME Comm Ins  [place]  07/31/2018  1   07/31/2018  Hydrocodone-Acetamin 7.5-325  70.00 25 Je Cop  [id number]  [place] (2372)  0/0

## 2019-01-08 NOTE — ED Triage Notes (Signed)
Pt arrived by [facility] from home due to right sided flank pain 8.5/10. Pt has a history of kidney stones and following up with urology. Pt has an rx for hydrocodone, which he took with no pain relief. Per EMS, fentanyl IV was given with little relief.

## 2019-01-09 ENCOUNTER — Encounter (HOSPITAL_COMMUNITY): Payer: Self-pay

## 2019-01-09 ENCOUNTER — Emergency Department (HOSPITAL_COMMUNITY): Payer: BLUE CROSS/BLUE SHIELD

## 2019-01-09 DIAGNOSIS — R0602 Shortness of breath: Secondary | ICD-10-CM | POA: Diagnosis not present

## 2019-01-09 DIAGNOSIS — K5730 Diverticulosis of large intestine without perforation or abscess without bleeding: Secondary | ICD-10-CM | POA: Diagnosis not present

## 2019-01-09 DIAGNOSIS — N200 Calculus of kidney: Secondary | ICD-10-CM | POA: Diagnosis not present

## 2019-01-09 LAB — SJOGREN'S SYNDROME ANTIBODS(SSA + SSB): ENA SSB (LA) Ab: <0.2 AI (ref 0.0–0.9)

## 2019-01-09 LAB — URINALYSIS, ROUTINE W REFLEX MICROSCOPIC
Bilirubin Urine: NEGATIVE
Glucose, UA: NEGATIVE mg/dL
Hgb urine dipstick: NEGATIVE
Ketones, ur: NEGATIVE mg/dL
Leukocytes, UA: NEGATIVE
NITRITE: NEGATIVE
Protein, ur: NEGATIVE mg/dL
Specific Gravity, Urine: 1.023 (ref 1.005–1.030)
pH: 6 (ref 5.0–8.0)

## 2019-01-09 LAB — BRAIN NATRIURETIC PEPTIDE: B Natriuretic Peptide: 12.9 pg/mL (ref 0.0–100.0)

## 2019-01-09 MED ORDER — TAMSULOSIN HCL 0.4 MG PO CAPS: 0.4000 mg | ORAL_CAPSULE | Freq: Every day | ORAL | 0 refills | Status: DC

## 2019-01-09 MED ORDER — ALBUTEROL SULFATE (2.5 MG/3ML) 0.083% IN NEBU
5.0000 mg | INHALATION_SOLUTION | Freq: Once | RESPIRATORY_TRACT | Status: AC
  Administered 2019-01-09: 5 mg via RESPIRATORY_TRACT
  Filled 2019-01-09: qty 6

## 2019-01-09 MED ORDER — IPRATROPIUM BROMIDE 0.02 % IN SOLN
0.5000 mg | Freq: Once | RESPIRATORY_TRACT | Status: AC
  Administered 2019-01-09: 0.5 mg via RESPIRATORY_TRACT
  Filled 2019-01-09: qty 2.5

## 2019-01-09 MED ORDER — IOPAMIDOL (ISOVUE-370) INJECTION 76%
INTRAVENOUS | Status: AC
  Filled 2019-01-09: qty 100

## 2019-01-09 MED ORDER — HYDROMORPHONE HCL 1 MG/ML IJ SOLN
1.0000 mg | Freq: Once | INTRAMUSCULAR | Status: AC
  Administered 2019-01-09: 1 mg via INTRAVENOUS
  Filled 2019-01-09: qty 1

## 2019-01-09 MED ORDER — KETOROLAC TROMETHAMINE 30 MG/ML IJ SOLN
30.0000 mg | Freq: Once | INTRAMUSCULAR | Status: AC
  Administered 2019-01-09: 30 mg via INTRAVENOUS
  Filled 2019-01-09: qty 1

## 2019-01-09 MED ORDER — AEROCHAMBER Z-STAT PLUS/MEDIUM MISC
1.0000 | Freq: Once | Status: AC
  Administered 2019-01-09: 1
  Filled 2019-01-09: qty 1

## 2019-01-09 MED ORDER — IOPAMIDOL (ISOVUE-370) INJECTION 76%
100.0000 mL | Freq: Once | INTRAVENOUS | Status: AC | PRN
  Administered 2019-01-09: 100 mL via INTRAVENOUS

## 2019-01-09 MED ORDER — SODIUM CHLORIDE (PF) 0.9 % IJ SOLN
INTRAMUSCULAR | Status: AC
  Filled 2019-01-09: qty 50

## 2019-01-09 MED ORDER — ALBUTEROL SULFATE HFA 108 (90 BASE) MCG/ACT IN AERS
2.0000 | INHALATION_SPRAY | Freq: Once | RESPIRATORY_TRACT | Status: AC
  Administered 2019-01-09: 2 via RESPIRATORY_TRACT
  Filled 2019-01-09: qty 6.7

## 2019-01-09 NOTE — Discharge Instructions (Signed)
Thank you for allowing me to care for you today in the Emergency Department.   Please follow-up with your primary care provider regarding your shortness of breath.  You may use 2 puffs of the albuterol inhaler with a spacer every 4 hours as needed for shortness of breath.  Take one tablet of Flomax daily until your stone passes. If you pass it at home, use the strainer to catch it. You can then schedule a follow-up appointment with urology.  If you do not pass the stone in the next week, please call urology to schedule follow-up appointment.  You can take your home pain medication for pain control and add and 650 mg of Tylenol once every 6 hours.  Return to the emergency department if you develop severe shortness of breath, chest pain that is worse with exertion, a large amount of blood in your urine, uncontrollable pain, high fever, persistent vomiting, if you stop being able to pee, or other new, concerning symptoms.

## 2019-01-09 NOTE — ED Notes (Signed)
This writer walked in room to give pain medication and pt stated that he "wanted to submit a complaint because I called for you 25 minutes ago and you're just now coming in here. I am in severe pain and would not be here if I weren't."  This writer advised to pt that I have been with another pt and calling report in the last 30 minutes. This Clinical research associate offered for pt to see charge nurse for a better understanding, but refused.

## 2019-01-10 ENCOUNTER — Telehealth: Payer: Self-pay | Admitting: Neurology

## 2019-01-10 NOTE — Telephone Encounter (Signed)
Patient has significant B12 deficiency which can be very serious. B12 deficiency can cause the neuropathy in his feet and also other sever side effects including weakness, fatigue, easy bruising or bleeding,sore tongue, stomach upset, weight loss, and diarrhea or constipation, tingling or numbness to the fingers and toes, difficulty walking, mood changes, depression, memory loss, disorientation and, in severe cases, dementia. Recommend 1000-2000 mcg B12 daily. recheck B12 in 8 weeks with pcp. Thanks.

## 2019-01-11 NOTE — Telephone Encounter (Addendum)
I spoke with the pt and advised him that he has significant B12 deficiency. We discussed that B12 deficiency can cause the neuropathy in his feet and discussed the multiple other side effects/conditions from weakness to dementia. I advised that Dr. Lucia Gaskins recommends an OTC Vitamin B12 supplement daily and to have primary care recheck B12 in 8 weeks. Pt verbalized understanding and appreciation.

## 2019-01-13 LAB — HEAVY METALS, BLOOD
Arsenic: 5 ug/L (ref 2–23)
Lead, Blood: 1 ug/dL (ref 0–4)
Mercury: NOT DETECTED ug/L (ref 0.0–14.9)

## 2019-01-13 LAB — RHEUMATOID FACTOR: Rheumatoid fact SerPl-aCnc: <10 IU/mL (ref 0.0–13.9)

## 2019-01-13 LAB — VITAMIN B6: Vitamin B6: 6.6 ug/L (ref 5.3–46.7)

## 2019-01-13 LAB — ANA W/REFLEX: Anti Nuclear Antibody(ANA): NEGATIVE

## 2019-01-13 LAB — SJOGREN'S SYNDROME ANTIBODS(SSA + SSB): ENA SSB (LA) Ab: <0.2 AI (ref 0.0–0.9)

## 2019-01-13 LAB — VITAMIN B1: Thiamine: 130.3 nmol/L (ref 66.5–200.0)

## 2019-01-13 LAB — B. BURGDORFI ANTIBODIES: Lyme IgG/IgM Ab: <0.91 {ISR} (ref 0.00–0.90)

## 2019-01-13 LAB — HEPATITIS C ANTIBODY: Hep C Virus Ab: <0.1 s/co ratio (ref 0.0–0.9)

## 2019-01-13 LAB — HEMOGLOBIN A1C
Est. average glucose Bld gHb Est-mCnc: 103 mg/dL
Hgb A1c MFr Bld: 5.2 % (ref 4.8–5.6)

## 2019-01-13 LAB — B12 AND FOLATE PANEL
FOLATE: 9.4 ng/mL (ref >3.0)
Vitamin B-12: 206 pg/mL — ABNORMAL LOW (ref 232–1245)

## 2019-01-13 LAB — HIV ANTIBODY (ROUTINE TESTING W REFLEX): HIV Screen 4th Generation wRfx: NONREACTIVE

## 2019-01-13 LAB — METHYLMALONIC ACID, SERUM: Methylmalonic Acid: 506 nmol/L — ABNORMAL HIGH (ref 0–378)

## 2019-01-13 LAB — TSH: TSH: 2.09 u[IU]/mL (ref 0.450–4.500)

## 2019-01-14 ENCOUNTER — Encounter: Payer: Self-pay | Admitting: *Deleted

## 2019-01-14 NOTE — Telephone Encounter (Signed)
I spoke with the patient and discussed his questions. He asked for a letter to be sent to his home that clarified as well. He said he had heard 2-4 so he taking 3 per day. I explained to patient that as discussed on Friday, Dr. Lucia Gaskins recommends 1000-2000 micrograms daily of Vitamin B12. Pt verbalized understanding. His questions were answered. He is aware to f/u with PCP in 8 weeks for B12 recheck and I advised that per Dr. Trevor Mace note if his symptoms do not improve or worsen he can f/u with our office. He verbalized appreciation.

## 2019-01-14 NOTE — Telephone Encounter (Signed)
Noted, letter addressed to pt and placed with outgoing mail.

## 2019-01-14 NOTE — Telephone Encounter (Signed)
Pt called back with questions oh what was gone over with him on 01/11/19 and has additional questions. Please advise.

## 2019-01-14 NOTE — Telephone Encounter (Signed)
Called pt & LVM (ok per DPR) asking for address clarification before letter is mailed.

## 2019-01-14 NOTE — Telephone Encounter (Signed)
Pt has called back to confirm that [address] [place], [place] [address] is his correct address

## 2019-01-15 NOTE — Progress Notes (Signed)
[facility] [address], [address] [place], [place] [address] [phone]  Date:  01/16/2019   Name:  Jonathan Harrington   DOB:  [birth year]   MRN:  [id number]  PCP:  Pearline Cables, MD    Chief Complaint: Leg Pain (Swellimg in feet in leg SOB )   History of Present Illness:  Jonathan Harrington is a 63 y.o. very pleasant male patient who presents with the following:  History of HTN, chronic pain, alcohol abuse In the ER with COPD exacerbation about a week ago-  He presented with weight gain, SOB, LE swelling and abdominal distention.  Pt reports he actually went to the ER because he was having bad right side pain -which turned out to be a kidney stone His BNP was normal, no pulmonary edema on CT scan:  CTA chest: 1. No pulmonary embolus identified. 2. Severe coronary artery calcific atherosclerosis. Aortic Atherosclerosis (ICD10-I70.0). 3. Platelike atelectasis in the lung bases. CT abdomen and pelvis: 1. 6 mm stone previously in lower pole of right kidney has migrated to the ureteropelvic junction. No associated pelvicaliectasis and contrast passes the stone into the downstream ureter on the delayed phase. 2. Colonic diverticulosis, no findings of acute diverticulitis. 3. Stable 4 mm nodule at the gallbladder fundus, possibly a polyp.  Per ER notes he felt better after albuterol nebs, and was given flonax for kidney stone  He does not think he has passed the stone, but he is no longer having the side pain  He is taking flomax as directed  He has been to see Dr. Pearletha Forge for his great toe pain They tried a higher dose of gabapentin which did help for a little while, then they tapered off- he is taking 300 mg TID at this time  His main concern today is pain and cramping in his bilateral calves.  He describes what sounds like claudication-predictable calf pain with walking.  Notes that this has worsened over the last 2-3 weeks.   The pain goes away when he rests  Wt Readings from Last 3 Encounters:  01/08/19 200 lb (90.7 kg)  11/15/18 202 lb (91.6 kg)  09/13/18 186 lb (84.4 kg)   He has gained some weight over the last few months CXR from 1/28 did not show any pulmonary effusion, BNP was normal   Allopurinol Alprazolam as needed Amlodipine 10 Gabapentin Hydrocodone as needed Lisinopril Crestor 10 Flomax  BP Readings from Last 3 Encounters:  01/16/19 (!) 145/67  01/09/19 (!) 120/56  11/15/18 (!) 156/86    Patient Active Problem List   Diagnosis Date Noted  . Alcoholism (HCC) 11/19/2018  . Essential hypertension 06/06/2016  . Smoker 05/12/2016  . Left inguinal hernia 05/23/2014  . Anxiety state, unspecified 09/17/2013  . Gout 01/17/2013  . DIVERTICULOSIS-COLON 08/13/2010  . DIVERTICULITIS, COLON 08/13/2010  . ABDOMINAL PAIN-LLQ 08/13/2010  . Nonspecific (abnormal) findings on radiological and other examination of body structure 08/13/2010  . PERSONAL HX COLONIC POLYPS 08/13/2010  . NONSPCIFC ABN FINDING RAD & OTH EXAM LUNG FIELD 08/13/2010    Past Medical History:  Diagnosis Date  . Alcoholism (HCC)   . Anxiety   . Diverticulitis 2011   resulted in partial colectomy  . Essential hypertension 06/06/2016  . Full dentures   . Gout   . Hematuria   . Wears glasses     Past Surgical History:  Procedure Laterality Date  . COLON SURGERY  2011  partial colectomy  . COLONOSCOPY    . FINGER ARTHROPLASTY  1990   rt index fx  . HERNIA REPAIR    . INGUINAL HERNIA REPAIR Left 05/27/2014   Procedure: LEFT INGUINAL HERNIA REPAIR WITH MESH;  Surgeon: Wilmon Arms. Corliss Skains, MD;  Location: [facility];  Service: General;  Laterality: Left;  . INSERTION OF MESH Left 05/27/2014   Procedure: INSERTION OF MESH;  Surgeon: Wilmon Arms. Corliss Skains, MD;  Location: [facility];  Service: General;  Laterality: Left;    Social History   Tobacco Use  . Smoking status: Current Every Day  Smoker    Packs/day: 2.00  . Smokeless tobacco: Never Used  Substance Use Topics  . Alcohol use: Yes    Alcohol/week: 21.0 standard drinks    Types: 21 Cans of beer per week    Comment: about 3 beers daily- history of heavy liquor abuse  . Drug use: No    Family History  Problem Relation Age of Onset  . Alzheimer's disease Mother   . Cancer Sister   . Neuropathy Neg Hx     Allergies  Allergen Reactions  . Ibuprofen Swelling    Medication list has been reviewed and updated.  Current Outpatient Medications on File Prior to Visit  Medication Sig Dispense Refill  . allopurinol (ZYLOPRIM) 300 MG tablet Take 1 tablet (300 mg total) by mouth daily. 30 tablet 6  . ALPRAZolam (XANAX) 0.25 MG tablet Take 1/2 or 1 pill twice daily as needed for anxiety. 45 tablet 1  . amLODipine (NORVASC) 10 MG tablet Take 1 tablet (10 mg total) by mouth daily. 90 tablet 3  . cyanocobalamin 2000 MCG tablet Take 2,000 mcg by mouth daily.    . diphenhydrAMINE HCl, Sleep, (SLEEP AID) 25 MG CAPS Take by mouth.    . Doxylamine Succinate, Sleep, (SLEEP AID PO) Take 50 mg by mouth at bedtime.    . gabapentin (NEURONTIN) 300 MG capsule Take two (2) capusules three (3) times a day. 180 capsule 1  . HYDROcodone-acetaminophen (NORCO) 7.5-325 MG tablet Take 1 tablet by mouth every 8 (eight) hours as needed for moderate pain. 70 tablet 0  . lisinopril (PRINIVIL,ZESTRIL) 40 MG tablet TAKE 1 TABLET BY MOUTH EVERY DAY 90 tablet 3  . Multiple Vitamins-Minerals (MULTIVITAMIN WITH MINERALS) tablet Take 1 tablet by mouth daily. Reported on 03/21/2016    . rosuvastatin (CRESTOR) 10 MG tablet Take 1 tablet (10 mg total) by mouth daily. 90 tablet 3  . tamsulosin (FLOMAX) 0.4 MG CAPS capsule Take 1 capsule (0.4 mg total) by mouth daily after breakfast. 30 capsule 0   No current facility-administered medications on file prior to visit.     Review of Systems:  As per HPI- otherwise negative.   Physical  Examination: Vitals:   01/16/19 1430  BP: (!) 145/67  Pulse: 95  Resp: 16  Temp: 98.1 F (36.7 C)  SpO2: 99%   Vitals:   01/16/19 1430  Height: 6' (1.829 m)   Body mass index is 27.12 kg/m. Ideal Body Weight: Weight in (lb) to have BMI = 25: 183.9  GEN: WDWN, NAD, Non-toxic, A & O x 3, overweight, looks well HEENT: Atraumatic, Normocephalic. Neck supple. No masses, No LAD. Ears and Nose: No external deformity. CV: RRR, No M/G/R. No JVD. No thrill. No extra heart sounds. PULM: CTA B, no wheezes, crackles, rhonchi. No retractions. No resp. distress. No accessory muscle use. EXTR: No c/c NEURO Normal gait.  PSYCH: Normally  interactive. Conversant. Not depressed or anxious appearing.  Calm demeanor.  I cannot palpate a pedal pulse in either foot, but they are warm and showed normal capillary refill. Trace edema of both feet is present  Assessment and Plan: Intermittent claudication (HCC) - Plan: VAS [place] ABI WITH/WO TBI, CANCELED: VAS [place] ABI WITH/WO TBI   Patient is here today with what sounds like intermittent claudication.  I ordered ABI testing, and called the vascular lab on [facility] to ensure that this gets scheduled.  They will set this up for the patient ASAP We discussed potential treatment options if he does have an arterial blockage, including medications and in some cases stenting or surgery Signed Abbe Amsterdam, MD

## 2019-01-16 ENCOUNTER — Ambulatory Visit: Payer: BLUE CROSS/BLUE SHIELD | Admitting: Family Medicine

## 2019-01-16 ENCOUNTER — Encounter: Payer: Self-pay | Admitting: Family Medicine

## 2019-01-16 VITALS — BP 145/67 | HR 95 | Temp 98.1°F | Resp 16 | Ht 72.0 in

## 2019-01-16 DIAGNOSIS — I739 Peripheral vascular disease, unspecified: Secondary | ICD-10-CM

## 2019-01-16 NOTE — Patient Instructions (Addendum)
We are going to set you up for "ABI" testing- this is to check the blood pressure in your feet and toes.  I will be in touch with this report asap  I suspect that you have some blockage in the arterial circulation to your legs - this may be why your legs are hurting so much

## 2019-01-21 ENCOUNTER — Ambulatory Visit (HOSPITAL_COMMUNITY)
Admission: RE | Admit: 2019-01-21 | Discharge: 2019-01-21 | Disposition: A | Discharge status: Home / Self Care | Payer: BLUE CROSS/BLUE SHIELD | Source: Ambulatory Visit | Attending: Family | Admitting: Family

## 2019-01-21 DIAGNOSIS — I739 Peripheral vascular disease, unspecified: Secondary | ICD-10-CM

## 2019-01-22 ENCOUNTER — Telehealth: Payer: Self-pay | Admitting: Family Medicine

## 2019-01-22 DIAGNOSIS — I739 Peripheral vascular disease, unspecified: Secondary | ICD-10-CM

## 2019-01-22 NOTE — Telephone Encounter (Signed)
Called pt and let him know ABI testing was positive Will refer to vascular surgery for a consultation He will let me know if he does not hear from them

## 2019-01-28 NOTE — Telephone Encounter (Signed)
Pt is calling for an update. States he has not heard from anyone and his leg is getting worse. Please advise

## 2019-01-29 NOTE — Telephone Encounter (Signed)
Called- [facility] has the referral and will call him tomorrow

## 2019-01-30 NOTE — Telephone Encounter (Signed)
Call patient to discuss his concerns He notes continued back pain, he is not sure if this is due to kidney stone or to spine disease. He wants me to increase both his Xanax and his hydrocodone, which I declined to do at this time I asked him to come in tomorrow at 1130 for a visit, and he agrees to do so  Fredric Mare can you please put this patient in our 11:30 spot on 2/20?  It will not let me schedule him

## 2019-01-30 NOTE — Telephone Encounter (Signed)
Pt states that he would like to speak with Dr Patsy Lager that know one is calling him back but want Dr Patsy Lager to call him stating that the HYDROcodone-acetaminophen (NORCO) 7.5-325 MG tablet isn't working want to know if he need to up dose his back has gotten worst he hasn't passed the kidney stone still have no felling in leg please call pt at [phone]

## 2019-01-31 ENCOUNTER — Ambulatory Visit: Payer: BLUE CROSS/BLUE SHIELD | Admitting: Family Medicine

## 2019-01-31 ENCOUNTER — Encounter: Payer: Self-pay | Admitting: Family Medicine

## 2019-01-31 VITALS — BP 122/72 | HR 98 | Temp 98.2°F | Resp 16 | Ht 72.0 in | Wt 200.0 lb

## 2019-01-31 DIAGNOSIS — F411 Generalized anxiety disorder: Secondary | ICD-10-CM | POA: Diagnosis not present

## 2019-01-31 DIAGNOSIS — M545 Low back pain: Secondary | ICD-10-CM

## 2019-01-31 DIAGNOSIS — I739 Peripheral vascular disease, unspecified: Secondary | ICD-10-CM | POA: Diagnosis not present

## 2019-01-31 DIAGNOSIS — F1010 Alcohol abuse, uncomplicated: Secondary | ICD-10-CM | POA: Diagnosis not present

## 2019-01-31 DIAGNOSIS — G8929 Other chronic pain: Secondary | ICD-10-CM

## 2019-01-31 NOTE — Telephone Encounter (Signed)
Patient has been scheduled

## 2019-01-31 NOTE — Progress Notes (Signed)
[facility] [address], [address] [place], [place] [id number] [phone]  Date:  01/31/2019   Name:  Jonathan Harrington   DOB:  07/13/1956   MRN:  [id number]  PCP:  Pearline Cablesopland, Margit Batte C, MD    Chief Complaint: Nephrolithiasis (needing ultrasound, pain in back worsening, increased pain medication)   History of Present Illness:  Jonathan Harrington is a 63 y.o. very pleasant male patient who presents with the following:  Here today for a re-check for pain He had called me yesterday due to pain (in general, the exact pain was not clear), and we scheduled today's visit He was seen in the ER on 1/28-right flank pain According to this note, Jonathan Harrington reported he was drinking "a couple of beers and/or wine coolers daily" During our phone conversation yesterday, he told me that he is not drinking at all, and he wants me to increase his narcotics and or benzos He complains of pain in his back and legs, and of "panic attacks"  He had a CT in the ER which showed a 6 mm stone at the right UPJ CT 01/09/2019: IMPRESSION: CTA chest: 1. No pulmonary embolus identified. 2. Severe coronary artery calcific atherosclerosis. Aortic Atherosclerosis (ICD10-I70.0). 3. Platelike atelectasis in the lung bases. CT abdomen and pelvis: 1. 6 mm stone previously in lower pole of right kidney has migrated to the ureteropelvic junction. No associated pelvicaliectasis and contrast passes the stone into the downstream ureter on the delayed phase. 2. Colonic diverticulosis, no findings of acute diverticulitis. 3. Stable 4 mm nodule at the gallbladder fundus, possibly a polyp.  He does not think he has passed a kidney stone, but is no longer having renal colic type pain I then saw him on February 5-at that time his main concern was bilateral calf pain which sounded like claudication I ordered ABI testing which was positive, and referred him to vascular surgery for  evaluation He has moderate bilateral lower extremity arterial disease, as follows Summary: Right: Resting right ankle-brachial index indicates moderate right lower extremity arterial disease. The right toe-brachial index is abnormal. RT great toe pressure = 84 mmHg. Left: Resting left ankle-brachial index indicates moderate left lower extremity arterial disease. The left toe-brachial index is normal. LT Great toe pressure = 114 mmHg. I had called vascular surgery 2 days ago and was assured that the pt would be called for an appointment.  Patient states that he did not hear from them, so he called today and was told that he "needed a referral from my doctor"  Today, Jonathan Harrington notes that his leg pain with walking seems to be getting worse.  Both sides are about equal He can walk about 60 yards before the pain starts.   It does get better with rest He is able to sit a lot of the time at his job- he is ok while seated  He feels like his stomach is bloated like he has to "go to the bathroom" He has been using Tums for this which does help  He notes that his chronic back pain (present for years), seems to be getting worse He rises at 5 am and will take a pain pill.  This used to hold him until lunch, but he is now needing to take another 1/2 pill at 7 or 8 am He is taking his pills more quickly often than prescribed, advised him that I will not refill early He is interestingly  not having back pain at night  We did plain films of his lumbar spine in 2017 IMPRESSION: 1. Diffuse multilevel degenerative change lumbar spine. Stable mild L2 compression fracture. 2. Avascular necrosis of the right femoral head cannot be excluded . 3.  Right nephrolithiasis cannot be excluded. 4.  Aortic atherosclerosis.  We also got hip films as follows DG HIP (WITH OR WITHOUT PELVIS) 2-3V RIGHT COMPARISON:  Lumbar spine series of June 06, 2016  FINDINGS: The bones are subjectively adequately mineralized. There is  an ovoid sclerotic lesion in the right femoral head with surrounding scleroses compatible with avascular necrosis. There is a similar but smaller lesion in the left femoral head. There is no collapse of the articular bone observed on today's study. The right hip joint space is reasonably well maintained. The acetabulum exhibits no acute abnormality. The observed portions of the right hemipelvis are normal. The femoral neck, intertrochanteric, and subtrochanteric regions are normal. There are vascular calcifications in the iliac arteries. 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 No collapse of the articular surface of the right femoral head is observed. No acute bony abnormality.  I referred him to orthopedics in August 2017 for avascular necrosis.  Patient reports that he was seen, but I do not have these notes.  What happened at orthopedics is not very clear from his report Will send a records request He is trying to quit smoking He is getting medicare soon- good news   Jonathan Harrington continues to be very frustrated that I will not increase his hydrocodone, or alprazolam. He clearly told me on the phone yesterday that he had stopped drinking, but today goes back on this saying that he still drinks "socially" and denies telling me that he had quit He admits to approximately 2 drinks a day-however in the past he has been quite a heavy drinker  He argued with me about the subject quite a bit today.  I advised him that I am worried about a potentially fatal overdose with the combination of narcotics, benzodiazepines, and alcohol. I have advised him repeatedly that he should not drink alcohol at all while he is taking narcotics and/or benzodiazepines.  I am certainly not willing to increase his doses, although I do feel it is necessary to treat his pain and anxiety.  I also do appreciate his honesty in admitting that he is still drinking  alcohol I have advised Jonathan Harrington that he is certainly free to seek another physician's opinion, if he disagrees with my care  I had planned today to take some plain films of his lumbar spine, as he feels that his back is getting worse.  However he ended up declining to have these films today 01/10/2019  1   01/08/2019  Alprazolam 0.25 MG Tablet  45.00 23 Je Cop   [id number]   [place] (2372)   0  0.98 LME  Comm Ins   [place]  12/25/2018  1   12/25/2018  Hydrocodone-Acetamin 7.5-325  70.00 23 Je Cop   [id number]   [place] (2372)   0  22.83 MME  Comm Ins   [place]  12/13/2018  1   11/29/2018  Alprazolam 0.25 MG Tablet  45.00 30 Je Cop   [id number]   [place] (2372)   0  0.75 LME  Comm Ins   [place]  11/26/2018  1   11/26/2018  Hydrocodone-Acetamin 7.5-325  70.00 23 Je Cop   [id number]   [place] (2372)  0  22.83 MME  Comm Ins   [place]  10/31/2018  1   10/02/2018  Alprazolam 0.25 MG Tablet  45.00 30 Je Cop   [id number]   [place] (2372)   1  0.75 LME  Comm Ins   [place]  10/23/2018  1   10/23/2018  Hydrocodone-Acetamin 7.5-325  70.00 23 Je Cop   [id number]   [place] (2372)   0  22.83 MME  Comm Ins   [place]  10/02/2018  1   10/02/2018  Alprazolam 0.25 MG Tablet  45.00 30 Je Cop   [id number]   [place] (2372)   0  0.75 LME  Comm Ins   [place]  09/18/2018  1   09/18/2018  Hydrocodone-Acetamin 7.5-325  70.00 23 Je Cop   [id number]   [place] (2372)   0  22.83 MME  Comm Ins   [place]  08/30/2018  1   08/30/2018  Hydrocodone-Acetamin 5-325 MG  70.00 24 Je Cop   [id number]   [place] (2372)   0  14.58 MME  Comm Ins   [place]  08/30/2018  1   08/30/2018  Alprazolam 0.25 MG Tablet  45.00 30 Je Cop   [id number]   [place] (2372)   0  0.75 LME  Comm Ins   [place]  07/31/2018  1   07/31/2018  Alprazolam 0.25 MG Tablet  60.00 30 Je Cop   [id number]   [place] (2372)   0  1.00 LME  Comm Ins   [place]  07/31/2018  1   07/31/2018  Hydrocodone-Acetamin 7.5-325  70.00 25 Je Cop   [id number]   [place] (2372)   0  21.00 MME       Patient Active Problem List   Diagnosis Date Noted  . Alcoholism (HCC) 11/19/2018  . Essential hypertension 06/06/2016  .  Smoker 05/12/2016  . Left inguinal hernia 05/23/2014  . Anxiety state, unspecified 09/17/2013  . Gout 01/17/2013  . DIVERTICULOSIS-COLON 08/13/2010  . DIVERTICULITIS, COLON 08/13/2010  . ABDOMINAL PAIN-LLQ 08/13/2010  . Nonspecific (abnormal) findings on radiological and other examination of body structure 08/13/2010  . PERSONAL HX COLONIC POLYPS 08/13/2010  . NONSPCIFC ABN FINDING RAD & OTH EXAM LUNG FIELD 08/13/2010    Past Medical History:  Diagnosis Date  . Alcoholism (HCC)   . Anxiety   . Diverticulitis 2011   resulted in partial colectomy  . Essential hypertension 06/06/2016  . Full dentures   . Gout   . Hematuria   . Wears glasses     Past Surgical History:  Procedure Laterality Date  . COLON SURGERY  2011   partial colectomy  . COLONOSCOPY    . FINGER ARTHROPLASTY  1990   rt index fx  . HERNIA REPAIR    . INGUINAL HERNIA REPAIR Left 05/27/2014   Procedure: LEFT INGUINAL HERNIA REPAIR WITH MESH;  Surgeon: Wilmon Arms. Corliss Skains, MD;  Location: [facility];  Service: General;  Laterality: Left;  . INSERTION OF MESH Left 05/27/2014   Procedure: INSERTION OF MESH;  Surgeon: Wilmon Arms. Corliss Skains, MD;  Location: [facility];  Service: General;  Laterality: Left;    Social History   Tobacco Use  . Smoking status: Current Every Day Smoker    Packs/day: 2.00  . Smokeless tobacco: Never Used  Substance Use Topics  . Alcohol use: Yes    Alcohol/week: 21.0 standard drinks    Types: 21 Cans of beer per week    Comment: about 3 beers daily- history of  heavy liquor abuse  . Drug use: No    Family History  Problem Relation Age of Onset  . Alzheimer's disease Mother   . Cancer Sister   . Neuropathy Neg Hx     Allergies  Allergen Reactions  . Ibuprofen Swelling    Medication list has been reviewed and updated.  Current Outpatient Medications on File Prior to Visit  Medication Sig Dispense Refill  . allopurinol (ZYLOPRIM) 300 MG tablet Take 1  tablet (300 mg total) by mouth daily. 30 tablet 6  . ALPRAZolam (XANAX) 0.25 MG tablet Take 1/2 or 1 pill twice daily as needed for anxiety. 45 tablet 1  . amLODipine (NORVASC) 10 MG tablet Take 1 tablet (10 mg total) by mouth daily. 90 tablet 3  . cyanocobalamin 2000 MCG tablet Take 2,000 mcg by mouth daily.    . diphenhydrAMINE HCl, Sleep, (SLEEP AID) 25 MG CAPS Take by mouth.    . Doxylamine Succinate, Sleep, (SLEEP AID PO) Take 50 mg by mouth at bedtime.    . gabapentin (NEURONTIN) 300 MG capsule Take two (2) capusules three (3) times a day. 180 capsule 1  . HYDROcodone-acetaminophen (NORCO) 7.5-325 MG tablet Take 1 tablet by mouth every 8 (eight) hours as needed for moderate pain. 70 tablet 0  . lisinopril (PRINIVIL,ZESTRIL) 40 MG tablet TAKE 1 TABLET BY MOUTH EVERY DAY 90 tablet 3  . Multiple Vitamins-Minerals (MULTIVITAMIN WITH MINERALS) tablet Take 1 tablet by mouth daily. Reported on 03/21/2016    . rosuvastatin (CRESTOR) 10 MG tablet Take 1 tablet (10 mg total) by mouth daily. 90 tablet 3  . tamsulosin (FLOMAX) 0.4 MG CAPS capsule Take 1 capsule (0.4 mg total) by mouth daily after breakfast. 30 capsule 0   No current facility-administered medications on file prior to visit.     Review of Systems:  As per HPI- otherwise negative.  No fever chills Physical Examination: Vitals:   01/31/19 1146  BP: 122/72  Pulse: 98  Resp: 16  Temp: 98.2 F (36.8 C)  SpO2: 97%   Vitals:   01/31/19 1146  Weight: 200 lb (90.7 kg)  Height: 6' (1.829 m)   Body mass index is 27.12 kg/m. Ideal Body Weight: Weight in (lb) to have BMI = 25: 183.9  GEN: WDWN, NAD, Non-toxic, A & O x 3, mild overweight, looks well HEENT: Atraumatic, Normocephalic. Neck supple. No masses, No LAD. Ears and Nose: No external deformity. CV: RRR, No M/G/R. No JVD. No thrill. No extra heart sounds. PULM: CTA B, no wheezes, crackles, rhonchi. No retractions. No resp. distress. No accessory muscle use. ABD: S, NT,  ND EXTR: No c/c/e NEURO Normal gait.  PSYCH: Normally interactive. Conversant. Not depressed or anxious appearing.  Calm demeanor.  I am not able to palpate a dorsal pulse in either foot, other both are certainly warm and well-perfused.  Cap refill is normal  Assessment and Plan: Chronic midline low back pain without sciatica - Plan: DG Lumbar Spine Complete, DG HIPS BILAT W OR W/O PELVIS 2V  GAD (generalized anxiety disorder)  Intermittent claudication (HCC)  Alcohol abuse  Here today with concern of worsening back pain.  I have declined to increase his dose of hydrocodone.  We planned for plain films of his lumbar spine today, and possible follow-up with orthopedics.  However at this time he declines further evaluation.  Would like to wait until his Medicare kicks in in a few months Continue current doses of alprazolam and hydrocodone, I have advised him  that it is dangerous to combine these medications with alcohol I have advised him that if and when he is able to stop drinking completely, we can consider increasing his dose of hydrocodone He has a vascular surgery appointment on March 10, to evaluate claudication. He was noted to have moderate vascular disease on recent ABI, so I do not expect any danger of critical ischemia  Signed Abbe Amsterdam, MD

## 2019-01-31 NOTE — Patient Instructions (Signed)
You have an appt at [facility], [id number] [phone]   Tuesday 02/19/2019 at 12:45  Please let me know if any change or worsening of your leg pains in the meantime  Please have plain x-rays of your lower back today- I will be in touch with your reports asap

## 2019-02-01 ENCOUNTER — Telehealth: Payer: Self-pay | Admitting: Family Medicine

## 2019-02-01 DIAGNOSIS — G8929 Other chronic pain: Secondary | ICD-10-CM

## 2019-02-01 DIAGNOSIS — M545 Low back pain: Principal | ICD-10-CM

## 2019-02-01 NOTE — Telephone Encounter (Signed)
This medication not delegated to NT to refill. Routing back to provider

## 2019-02-01 NOTE — Telephone Encounter (Signed)
Copied from CRM [id number]. Topic: Quick Communication - Rx Refill/Question >> Feb 01, 2019  8:39 AM Lynne Logan D wrote: Medication: HYDROcodone-acetaminophen (NORCO) 7.5-325 MG tablet / Pt stated he has to call in office for refills  Has the patient contacted their pharmacy? No (Agent: If no, request that the patient contact the pharmacy for the refill.) (Agent: If yes, when and what did the pharmacy advise?)  Preferred Pharmacy (with phone number or street name): [id number] [place], [place] - [address] [phone] (Phone) [phone] (Fax)  Agent: Please be advised that RX refills may take up to 3 business days. We ask that you follow-up with your pharmacy.

## 2019-02-05 MED ORDER — HYDROCODONE-ACETAMINOPHEN 7.5-325 MG PO TABS: 1.0000 | ORAL_TABLET | Freq: Three times a day (TID) | ORAL | 0 refills | Status: DC | PRN

## 2019-02-05 NOTE — Addendum Note (Signed)
Addended by: Abbe Amsterdam C on: 02/05/2019 03:54 PM   Modules accepted: Orders

## 2019-02-05 NOTE — Telephone Encounter (Signed)
Patient is calling in for a update on med refill °

## 2019-02-05 NOTE — Telephone Encounter (Signed)
I apologize for delay, as far as I know this is the first notice I have gotten of this refill request

## 2019-02-19 ENCOUNTER — Other Ambulatory Visit: Payer: Self-pay

## 2019-02-19 ENCOUNTER — Ambulatory Visit: Payer: BLUE CROSS/BLUE SHIELD | Admitting: Vascular Surgery

## 2019-02-19 ENCOUNTER — Encounter: Payer: Self-pay | Admitting: Vascular Surgery

## 2019-02-19 VITALS — BP 138/66 | HR 90 | Resp 18 | Ht 72.0 in | Wt 205.9 lb

## 2019-02-19 DIAGNOSIS — I70213 Atherosclerosis of native arteries of extremities with intermittent claudication, bilateral legs: Secondary | ICD-10-CM

## 2019-02-19 NOTE — Progress Notes (Signed)
[facility] [place]  Patient name: Jonathan Harrington MRN: [id number] DOB: 06-02-1956 Sex: male  REASON FOR CONSULT: Evaluation bilateral lower extremity arterial insufficiency  HPI: Jonathan Harrington is a 63 y.o. male, who is here today for discussion of his lower extremity symptoms.  He has multiple components of this.  He reports classic calf claudication bilaterally with walking.  This affects his calves and is relieved with rest.  He has no lower extremity tissue loss.  He does have a different component of numbness in his toes and the bottom of his feet.  This does appear to be more consistent with peripheral neuropathy.  He denies any prior history of cardiac disease.  Has a long history of cigarette abuse.  Past Medical History:  Diagnosis Date  . Alcoholism (HCC)   . Anxiety   . COPD (chronic obstructive pulmonary disease) (HCC)   . Diverticulitis 2011   resulted in partial colectomy  . Essential hypertension 06/06/2016  . Full dentures   . Gout   . Hematuria   . Wears glasses     Family History  Problem Relation Age of Onset  . Alzheimer's disease Mother   . Cancer Sister   . Neuropathy Neg Hx     SOCIAL HISTORY: Social History   Socioeconomic History  . Marital status: Widowed    Spouse name: died 18-Mar-2016  . Number of children: 1  . Years of education: Not on file  . Highest education level: Not on file  Occupational History  . Occupation: IT consultant  Social Needs  . Financial resource strain: Not on file  . Food insecurity:    Worry: Not on file    Inability: Not on file  . Transportation needs:    Medical: Not on file    Non-medical: Not on file  Tobacco Use  . Smoking status: Current Every Day Smoker    Packs/day: 2.00  . Smokeless tobacco: Never Used  Substance and Sexual Activity  . Alcohol use: Yes    Alcohol/week: 21.0 standard drinks    Types: 21 Cans of beer per week    Comment:  about 3 beers daily- history of heavy liquor abuse  . Drug use: No  . Sexual activity: Not on file  Lifestyle  . Physical activity:    Days per week: Not on file    Minutes per session: Not on file  . Stress: Not on file  Relationships  . Social connections:    Talks on phone: Not on file    Gets together: Not on file    Attends religious service: Not on file    Active member of club or organization: Not on file    Attends meetings of clubs or organizations: Not on file    Relationship status: Not on file  . Intimate partner violence:    Fear of current or ex partner: Not on file    Emotionally abused: Not on file    Physically abused: Not on file    Forced sexual activity: Not on file  Other Topics Concern  . Not on file  Social History Narrative   Lives alone following the death of his wife March 18, 2016 following 42 years of marriage.   His son lives in the [place] area, and visits on weekends.    Allergies  Allergen Reactions  . Ibuprofen Swelling    Current Outpatient Medications  Medication Sig Dispense Refill  . allopurinol (ZYLOPRIM) 300 MG tablet Take  1 tablet (300 mg total) by mouth daily. 30 tablet 6  . ALPRAZolam (XANAX) 0.25 MG tablet Take 1/2 or 1 pill twice daily as needed for anxiety. 45 tablet 1  . amLODipine (NORVASC) 10 MG tablet Take 1 tablet (10 mg total) by mouth daily. 90 tablet 3  . cyanocobalamin 2000 MCG tablet Take 2,000 mcg by mouth daily.    . diphenhydrAMINE HCl, Sleep, (SLEEP AID) 25 MG CAPS Take by mouth.    . Doxylamine Succinate, Sleep, (SLEEP AID PO) Take 50 mg by mouth at bedtime.    . gabapentin (NEURONTIN) 300 MG capsule Take two (2) capusules three (3) times a day. 180 capsule 1  . HYDROcodone-acetaminophen (NORCO) 7.5-325 MG tablet Take 1 tablet by mouth every 8 (eight) hours as needed for moderate pain. 70 tablet 0  . lisinopril (PRINIVIL,ZESTRIL) 40 MG tablet TAKE 1 TABLET BY MOUTH EVERY DAY 90 tablet 3  . Multiple Vitamins-Minerals  (MULTIVITAMIN WITH MINERALS) tablet Take 1 tablet by mouth daily. Reported on 03/21/2016    . rosuvastatin (CRESTOR) 10 MG tablet Take 1 tablet (10 mg total) by mouth daily. 90 tablet 3  . tamsulosin (FLOMAX) 0.4 MG CAPS capsule Take 1 capsule (0.4 mg total) by mouth daily after breakfast. 30 capsule 0   No current facility-administered medications for this visit.     REVIEW OF SYSTEMS:  [X]  denotes positive finding, [ ]  denotes negative finding Cardiac  Comments:  Chest pain or chest pressure:    Shortness of breath upon exertion:    Short of breath when lying flat:    Irregular heart rhythm:        Vascular    Pain in calf, thigh, or hip brought on by ambulation: x   Pain in feet at night that wakes you up from your sleep:  x   Blood clot in your veins:    Leg swelling:  x       Pulmonary    Oxygen at home:    Productive cough:     Wheezing:         Neurologic    Sudden weakness in arms or legs:  x   Sudden numbness in arms or legs:  x   Sudden onset of difficulty speaking or slurred speech:    Temporary loss of vision in one eye:     Problems with dizziness:         Gastrointestinal    Blood in stool:     Vomited blood:         Genitourinary    Burning when urinating:     Blood in urine:        Psychiatric    Major depression:         Hematologic    Bleeding problems:    Problems with blood clotting too easily:        Skin    Rashes or ulcers:        Constitutional    Fever or chills:      PHYSICAL EXAM: Vitals:   02/19/19 1326  BP: 138/66  Pulse: 90  Resp: 18  SpO2: 96%  Weight: 205 lb 14.6 oz (93.4 kg)  Height: 6' (1.829 m)    GENERAL: The patient is a well-nourished male, in no acute distress. The vital signs are documented above. CARDIOVASCULAR: 2+ radial and 2+ femoral pulses.  Absent popliteal and distal pulses bilaterally.  Carotid arteries without bruits bilaterally PULMONARY: There is good air exchange  ABDOMEN:  Soft and non-tender    MUSCULOSKELETAL: There are no major deformities or cyanosis. NEUROLOGIC: No focal weakness or paresthesias are detected. SKIN: There are no ulcers or rashes noted. PSYCHIATRIC: The patient has a normal affect.  DATA:  Lower extremity noninvasive studies reveal an ankle arm index of 0.64 on the right and 0.71 on the left  He did have a CT scan in January of this year for evaluation of renal stone.  This shows extensive calcification of his aorta iliac segments and probable occlusion at the origin of his superficial femoral arteries bilaterally  MEDICAL ISSUES: I had a long discussion with the patient regarding this.  I explained the relationship of cigarette smoking and his difficulty with walking and the progression of his atherosclerotic disease.  He currently has moderate limitation from his claudication.  He also has a significant limitation from his COPD.  I have recommended a very conservative approach.  If he has progression of symptoms, would proceed with arteriography.  He may have iliac disease that would be amenable to angioplasty but suspected superficial with occlusive disease is the main culprit.  He was comfortable with this discussion will notify should he develop any progressive rest pain or tissue loss   Larina Earthly, MD [facility] Tel [phone] Pager [phone]

## 2019-03-04 ENCOUNTER — Other Ambulatory Visit: Payer: Self-pay | Admitting: Family Medicine

## 2019-03-05 ENCOUNTER — Other Ambulatory Visit: Payer: Self-pay | Admitting: *Deleted

## 2019-03-05 MED ORDER — GABAPENTIN 300 MG PO CAPS: ORAL_CAPSULE | ORAL | 1 refills | Status: DC

## 2019-04-01 ENCOUNTER — Other Ambulatory Visit: Payer: Self-pay | Admitting: Family Medicine

## 2019-04-01 DIAGNOSIS — M545 Low back pain: Principal | ICD-10-CM

## 2019-04-01 DIAGNOSIS — L989 Disorder of the skin and subcutaneous tissue, unspecified: Secondary | ICD-10-CM

## 2019-04-01 DIAGNOSIS — G8929 Other chronic pain: Secondary | ICD-10-CM

## 2019-04-01 NOTE — Telephone Encounter (Signed)
Requested medication (s) are due for refill today: hydrocodone-acetaminophen 7.5-325 mg  Requested medication (s) are on the active medication list: yes  Last refill:  02/04/2019  Future visit scheduled: no  Notes to clinic:  Not delegated  Requested medication (s) are due for refill today: triamcinolone cream no  Requested medication (s) are on the active medication list: no  Last refill:  ?  Future visit scheduled: no  Notes to clinic: not on active medication list  Requested medication (s) are due for refill today: albuterol inhaler no  Requested medication (s) are on the active medication list:no  Last refill:  ?  Future visit scheduled: no  Notes to clinic:  Not on active medication list  Requested Prescriptions  Pending Prescriptions Disp Refills   HYDROcodone-acetaminophen (NORCO) 7.5-325 MG tablet 70 tablet 0    Sig: Take 1 tablet by mouth every 8 (eight) hours as needed for moderate pain.     Not Delegated - Analgesics:  Opioid Agonist Combinations Failed - 04/01/2019  9:39 AM      Failed - This refill cannot be delegated      Passed - Urine Drug Screen completed in last 360 days.      Passed - Valid encounter within last 6 months    Recent Outpatient Visits          2 months ago Chronic midline low back pain without sciatica   [facility] [name], [name], MD   2 months ago Intermittent claudication [facility](H[facility])   [facility] [name], [place] C, MD   6 months ago Chronic back pain, unspecified back location, unspecified back pain laterality   [facility] [place], [place] C, MD   8 months ago Chronic idiopathic gout involving toe of right foot without tophus   [facility] [name], [place] C, MD   9 months ago Chronic bilateral low back pain without sciatica   [facility] [name], [name], MD            triamcinolone cream (KENALOG) 0.1 % 90 g 2    Sig: Apply 1 application topically 2 (two) times daily.     Dermatology:  Corticosteroids Passed - 04/01/2019  9:39 AM      Passed - Valid encounter within last 12 months    Recent Outpatient Visits          2 months ago Chronic midline low back pain without sciatica   [facility] [name], [name], MD   2 months ago Intermittent claudication (HCC)   [facility] [name], [place] C, MD   6 months ago Chronic back pain, unspecified back location, unspecified back pain laterality   [facility] [place], [place] C, MD   8 months ago Chronic idiopathic gout involving toe of right foot without tophus   [facility] [name], [place] C, MD   9 months ago Chronic bilateral low back pain without sciatica   [facility] [name], [name], MD            albuterol (VENTOLIN HFA) 108 (90 Base) MCG/ACT inhaler 1 Inhaler 0    Sig: Inhale 2 puffs into the lungs every 6 (six)  hours as needed for wheezing or shortness of breath.     Pulmonology:  Beta Agonists Failed - 04/01/2019  9:39 AM      Failed - One inhaler should last at least one month. If the patient is requesting refills earlier, contact the patient to check for uncontrolled symptoms.      Passed - Valid encounter within last 12 months    Recent Outpatient Visits          2 months ago Chronic midline low back pain without sciatica   [facility] representative at [facility] [name], Gwenlyn Found, MD   2 months ago Intermittent claudication (HCC)   Holiday representative at [facility] [name], [place] C, MD   6 months ago Chronic back pain, unspecified back location, unspecified back pain laterality    Holiday representative at [facility] [place], [place] C, MD   8 months ago Chronic idiopathic gout involving toe of right foot without tophus   Holiday representative at [facility] [name], Gwenlyn Found, MD   9 months ago Chronic bilateral low back pain without sciatica   Holiday representative at [facility] [name], Gwenlyn Found, MD

## 2019-04-02 MED ORDER — TRIAMCINOLONE ACETONIDE 0.1 % EX CREA: 1.0000 "application " | TOPICAL_CREAM | Freq: Two times a day (BID) | CUTANEOUS | 2 refills | Status: DC

## 2019-04-02 MED ORDER — HYDROCODONE-ACETAMINOPHEN 7.5-325 MG PO TABS: 1.0000 | ORAL_TABLET | Freq: Three times a day (TID) | ORAL | 0 refills | Status: DC | PRN

## 2019-04-02 MED ORDER — ALBUTEROL SULFATE HFA 108 (90 BASE) MCG/ACT IN AERS: 2.0000 | INHALATION_SPRAY | Freq: Four times a day (QID) | RESPIRATORY_TRACT | 2 refills | Status: DC | PRN

## 2019-04-08 ENCOUNTER — Other Ambulatory Visit: Payer: Self-pay | Admitting: Family Medicine

## 2019-04-08 DIAGNOSIS — M1000 Idiopathic gout, unspecified site: Secondary | ICD-10-CM

## 2019-04-08 DIAGNOSIS — I10 Essential (primary) hypertension: Secondary | ICD-10-CM

## 2019-04-08 DIAGNOSIS — F411 Generalized anxiety disorder: Secondary | ICD-10-CM

## 2019-04-08 DIAGNOSIS — F4321 Adjustment disorder with depressed mood: Secondary | ICD-10-CM

## 2019-04-08 MED ORDER — ALLOPURINOL 300 MG PO TABS: 300.0000 mg | ORAL_TABLET | Freq: Every day | ORAL | 2 refills | Status: DC

## 2019-04-08 NOTE — Telephone Encounter (Signed)
Requested medication (s) are due for refill today: yes  Requested medication (s) are on the active medication list: yes  Last refill:  01/08/19 #45 with 1 refill  Future visit scheduled: No      Requested Prescriptions  Pending Prescriptions Disp Refills   ALPRAZolam (XANAX) 0.25 MG tablet 45 tablet 1    Sig: Take 1/2 or 1 pill twice daily as needed for anxiety.     Not Delegated - Psychiatry:  Anxiolytics/Hypnotics Failed - 04/08/2019 10:34 AM      Failed - This refill cannot be delegated      Passed - Urine Drug Screen completed in last 360 days.      Passed - Valid encounter within last 6 months    Recent Outpatient Visits          2 months ago Chronic midline low back pain without sciatica   Holiday representative at [facility] [name], Gwenlyn Found, MD   2 months ago Intermittent claudication [facility])   Holiday representative at [facility], [place] C, MD   6 months ago Chronic back pain, unspecified back location, unspecified back pain laterality   Holiday representative at [facility] [place], [place] C, MD   8 months ago Chronic idiopathic gout involving toe of right foot without tophus   Holiday representative at [facility], [place] C, MD   9 months ago Chronic bilateral low back pain without sciatica   Holiday representative at [facility] [name], Gwenlyn Found, MD           Signed Prescriptions Disp Refills   allopurinol (ZYLOPRIM) 300 MG tablet 30 tablet 2    Sig: Take 1 tablet (300 mg total) by mouth daily.     Endocrinology:  Gout Agents Passed - 04/08/2019 10:34 AM      Passed - Uric Acid in normal range and within 360 days    Uric Acid, Serum  Date Value Ref Range Status  08/01/2018 5.2 4.0 - 7.8 mg/dL Final         Passed - Cr in normal range and within 360 days    Creat  Date Value Ref Range Status  05/12/2016 0.80 0.70 - 1.25 mg/dL Final   Creatinine, Ser   Date Value Ref Range Status  01/08/2019 0.97 0.61 - 1.24 mg/dL Final         Passed - Valid encounter within last 12 months    Recent Outpatient Visits          2 months ago Chronic midline low back pain without sciatica   Holiday representative at [facility], Gwenlyn Found, MD   2 months ago Intermittent claudication (HCC)   Holiday representative at [facility], [place] C, MD   6 months ago Chronic back pain, unspecified back location, unspecified back pain laterality   Holiday representative at [facility] [name], [place] C, MD   8 months ago Chronic idiopathic gout involving toe of right foot without tophus   Holiday representative at [facility] [name], [place] C, MD   9 months ago Chronic bilateral low back pain without sciatica   Holiday representative at [facility] [name], Gwenlyn Found, MD           Refused Prescriptions Disp Refills   amLODipine (NORVASC) 10 MG tablet 90 tablet 3  Sig: Take 1 tablet (10 mg total) by mouth daily.     Cardiovascular:  Calcium Channel Blockers Passed - 04/08/2019 10:34 AM      Passed - Last BP in normal range    BP Readings from Last 1 Encounters:  02/19/19 138/66         Passed - Valid encounter within last 6 months    Recent Outpatient Visits          2 months ago Chronic midline low back pain without sciatica   [facility] [name], [name], MD   2 months ago Intermittent claudication (HCC)   [facility] [name], [place] C, MD   6 months ago Chronic back pain, unspecified back location, unspecified back pain laterality   [facility] [place], [place] C, MD   8 months ago Chronic idiopathic gout involving toe of right foot without tophus   [facility] [name], [place] C,  MD   9 months ago Chronic bilateral low back pain without sciatica   [facility] [name], [name], MD

## 2019-04-09 MED ORDER — ALPRAZOLAM 0.25 MG PO TABS: ORAL_TABLET | ORAL | 1 refills | Status: DC

## 2019-04-12 ENCOUNTER — Other Ambulatory Visit: Payer: Self-pay

## 2019-04-29 ENCOUNTER — Other Ambulatory Visit: Payer: Self-pay | Admitting: Family Medicine

## 2019-04-29 DIAGNOSIS — G8929 Other chronic pain: Secondary | ICD-10-CM

## 2019-04-29 MED ORDER — HYDROCODONE-ACETAMINOPHEN 7.5-325 MG PO TABS: 1.0000 | ORAL_TABLET | Freq: Three times a day (TID) | ORAL | 0 refills | Status: DC | PRN

## 2019-04-29 NOTE — Telephone Encounter (Signed)
Copied from CRM [id number]. Topic: Quick Communication - Rx Refill/Question >> Apr 29, 2019 11:33 AM [name] wrote: Medication: HYDROcodone-acetaminophen (NORCO) 7.5-325 MG tablet  Has the patient contacted their pharmacy? No - states needs new refill (Agent: If no, request that the patient contact the pharmacy for the refill.) (Agent: If yes, when and what did the pharmacy advise?)  Preferred Pharmacy (with phone number or street name): [id number] [place], [place] - [address] [phone] (Phone) [phone] (Fax)  Agent: Please be advised that RX refills may take up to 3 business days. We ask that you follow-up with your pharmacy.

## 2019-05-10 DIAGNOSIS — H2513 Age-related nuclear cataract, bilateral: Secondary | ICD-10-CM | POA: Diagnosis not present

## 2019-05-10 DIAGNOSIS — H2511 Age-related nuclear cataract, right eye: Secondary | ICD-10-CM | POA: Diagnosis not present

## 2019-05-14 ENCOUNTER — Other Ambulatory Visit: Payer: Self-pay

## 2019-05-14 MED ORDER — ALBUTEROL SULFATE HFA 108 (90 BASE) MCG/ACT IN AERS: 2.0000 | INHALATION_SPRAY | Freq: Four times a day (QID) | RESPIRATORY_TRACT | 6 refills | Status: DC | PRN

## 2019-05-23 ENCOUNTER — Other Ambulatory Visit: Payer: Self-pay | Admitting: Family Medicine

## 2019-05-30 DIAGNOSIS — H2511 Age-related nuclear cataract, right eye: Secondary | ICD-10-CM | POA: Diagnosis not present

## 2019-05-31 DIAGNOSIS — H2512 Age-related nuclear cataract, left eye: Secondary | ICD-10-CM | POA: Diagnosis not present

## 2019-06-03 ENCOUNTER — Telehealth: Payer: Self-pay | Admitting: Family Medicine

## 2019-06-03 DIAGNOSIS — M545 Low back pain, unspecified: Secondary | ICD-10-CM

## 2019-06-03 DIAGNOSIS — G8929 Other chronic pain: Secondary | ICD-10-CM

## 2019-06-03 MED ORDER — HYDROCODONE-ACETAMINOPHEN 7.5-325 MG PO TABS: 1.0000 | ORAL_TABLET | Freq: Three times a day (TID) | ORAL | 0 refills | Status: DC | PRN

## 2019-06-03 NOTE — Telephone Encounter (Signed)
Last Hydrocodone RX: 04/29/19, #70 Last OV: 01/31/19 Next OV: none with PCP, tomorrow with Clearance Coots. UDS: 09/13/18 CSC: 04/12/17 w/PCP, 08/2018 with Sports Medicine

## 2019-06-03 NOTE — Telephone Encounter (Signed)
Patient requesting HYDROcodone-acetaminophen ([place]) 7.5-325 MG tablet, patient informed please allow 72 hour turn around time, please advise   [id number] [place], [place]. [phone] (Phone) [phone] (Fax)

## 2019-06-04 ENCOUNTER — Other Ambulatory Visit: Payer: Self-pay

## 2019-06-04 ENCOUNTER — Ambulatory Visit (INDEPENDENT_AMBULATORY_CARE_PROVIDER_SITE_OTHER): Payer: Medicare Other | Admitting: Family Medicine

## 2019-06-04 VITALS — BP 122/71 | HR 90 | Ht 72.0 in | Wt 200.0 lb

## 2019-06-04 DIAGNOSIS — G629 Polyneuropathy, unspecified: Secondary | ICD-10-CM | POA: Insufficient documentation

## 2019-06-04 MED ORDER — GABAPENTIN 300 MG PO CAPS: ORAL_CAPSULE | ORAL | 0 refills | Status: DC

## 2019-06-04 NOTE — Assessment & Plan Note (Signed)
Has intermittent symptoms. Possible to be related to his alcohol use.  - refilled gabapentin  - counseled on supportive care - may benefit from custom orthotics - consider Nerve study

## 2019-06-04 NOTE — Progress Notes (Signed)
Jonathan Harrington - 63 y.o. male MRN [id number]  Date of birth: 04/16/1956  SUBJECTIVE:  Including CC & ROS.  Chief Complaint  Patient presents with  . Follow-up    follow up for left foot    Jonathan Harrington is a 63 y.o. male that is  Presenting with left foot numbness and pain. He was previously evaluated for toe pain. He was provided a steroid injection into the 1st MTP joint. He reports suggestive of PAD and has a history of COPD. His symptoms in his foot have been ongoing. He has increased his B vitamins but hasn't noticed a different in his symptoms. He has noticed the pain in his joints and now has pain in the dorsal midfoot. He has a sock like distribution of numbness on the left foot.  Independent review of the lumbar spine x-ray from 2017 shows diffuse multilevel degenerative changes and a stable mild L2 compression fracture.  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   Review of Systems  Constitutional: Negative for fever.  HENT: Negative for congestion.   Respiratory: Negative for cough.   Cardiovascular: Positive for leg swelling. Negative for chest pain.  Gastrointestinal: Negative for abdominal pain.  Musculoskeletal: Positive for gait problem.  Skin: Negative for color change.  Neurological: Positive for numbness.  Hematological: Negative for adenopathy.    HISTORY: Past Medical, Surgical, Social, and Family History Reviewed & Updated per EMR.   Pertinent Historical Findings include:  Past Medical History:  Diagnosis Date  . Alcoholism (HCC)   . Anxiety   . COPD (chronic obstructive pulmonary disease) (HCC)   . Diverticulitis 2011   resulted in partial colectomy  . Essential hypertension 06/06/2016  . Full dentures   . Gout   . Hematuria   . Wears glasses     Past Surgical History:  Procedure Laterality Date  . COLON SURGERY  2011   partial colectomy  . COLONOSCOPY    .  FINGER ARTHROPLASTY  1990   rt index fx  . HERNIA REPAIR    . INGUINAL HERNIA REPAIR Left 05/27/2014   Procedure: LEFT INGUINAL HERNIA REPAIR WITH MESH;  Surgeon: Wilmon ArmsMatthew K. Corliss Skainssuei, MD;  Location: [facility];  Service: General;  Laterality: Left;  . INSERTION OF MESH Left 05/27/2014   Procedure: INSERTION OF MESH;  Surgeon: Wilmon ArmsMatthew K. Corliss Skainssuei, MD;  Location: [facility];  Service: General;  Laterality: Left;    Allergies  Allergen Reactions  . Ibuprofen Swelling    Family History  Problem Relation Age of Onset  . Alzheimer's disease Mother   . Cancer Sister   . Neuropathy Neg Hx      Social History   Socioeconomic History  . Marital status: Widowed    Spouse name: died 03/09/2016  . Number of children: 1  . Years of education: Not on file  . Highest education level: Not on file  Occupational History  . Occupation: IT consultantscissor inspector  Social Needs  . Financial resource strain: Not on file  . Food insecurity    Worry: Not on file    Inability: Not on file  . Transportation needs    Medical: Not on file    Non-medical: Not on file  Tobacco Use  . Smoking status: Current Every Day Smoker    Packs/day: 2.00  . Smokeless tobacco: Never Used  Substance and Sexual Activity  .  Alcohol use: Yes    Alcohol/week: 21.0 standard drinks    Types: 21 Cans of beer per week    Comment: about 3 beers daily- history of heavy liquor abuse  . Drug use: No  . Sexual activity: Not on file  Lifestyle  . Physical activity    Days per week: Not on file    Minutes per session: Not on file  . Stress: Not on file  Relationships  . Social Herbalist on phone: Not on file    Gets together: Not on file    Attends religious service: Not on file    Active member of club or organization: Not on file    Attends meetings of clubs or organizations: Not on file    Relationship status: Not on file  . Intimate partner violence    Fear of current or ex partner:  Not on file    Emotionally abused: Not on file    Physically abused: Not on file    Forced sexual activity: Not on file  Other Topics Concern  . Not on file  Social History Narrative   Lives alone following the death of his wife 2016-03-17 following 30 years of marriage.   His son lives in the [place] area, and visits on weekends.     PHYSICAL EXAM:  VS: BP 122/71   Pulse 90   Ht 6' (1.829 m)   Wt 200 lb (90.7 kg)   BMI 27.12 kg/m  Physical Exam Gen: NAD, alert, cooperative with exam, well-appearing ENT: normal lips, normal nasal mucosa,  Eye: normal EOM, normal conjunctiva and lids CV:  trace edema, +2 pedal pulses   Resp: no accessory muscle use, non-labored,  Skin: no rashes, no areas of induration  Neuro: normal tone, normal sensation to touch Psych:  normal insight, alert and oriented MSK:  Left foot:  Pes cavus foot  Normal ankle ROM  No swelling of the MTP joints  Normal strength to resistance  No specific area of tenderness  Neurovascularly intact       ASSESSMENT & PLAN:   Neuropathy Has intermittent symptoms. Possible to be related to his alcohol use.  - refilled gabapentin  - counseled on supportive care - may benefit from custom orthotics - consider Nerve study

## 2019-06-04 NOTE — Patient Instructions (Signed)
Nice to meet you Please try these supplements. Vitamin D 2000 IU daily Fish oil 2 grams daily.    Please send me a message in MyChart with any questions or updates.  Please see me back in 3 months.   --Dr. Raeford Razor

## 2019-06-11 ENCOUNTER — Other Ambulatory Visit: Payer: Self-pay | Admitting: *Deleted

## 2019-06-20 ENCOUNTER — Other Ambulatory Visit: Payer: Self-pay | Admitting: Family Medicine

## 2019-06-20 DIAGNOSIS — H2512 Age-related nuclear cataract, left eye: Secondary | ICD-10-CM | POA: Diagnosis not present

## 2019-06-20 DIAGNOSIS — F4321 Adjustment disorder with depressed mood: Secondary | ICD-10-CM

## 2019-06-20 DIAGNOSIS — F411 Generalized anxiety disorder: Secondary | ICD-10-CM

## 2019-06-27 ENCOUNTER — Other Ambulatory Visit: Payer: Self-pay

## 2019-06-28 ENCOUNTER — Ambulatory Visit (INDEPENDENT_AMBULATORY_CARE_PROVIDER_SITE_OTHER): Payer: Medicare Other | Admitting: Family Medicine

## 2019-06-28 ENCOUNTER — Encounter: Payer: Self-pay | Admitting: Family Medicine

## 2019-06-28 ENCOUNTER — Telehealth: Payer: Self-pay | Admitting: Family Medicine

## 2019-06-28 DIAGNOSIS — J441 Chronic obstructive pulmonary disease with (acute) exacerbation: Secondary | ICD-10-CM

## 2019-06-28 MED ORDER — PREDNISONE 20 MG PO TABS: 40.0000 mg | ORAL_TABLET | Freq: Every day | ORAL | 0 refills | Status: AC

## 2019-06-28 NOTE — Telephone Encounter (Signed)
Copied from [place] [id number]. Topic: General - Other >> Jun 28, 2019 12:17 PM Rainey Pines A wrote: Patient just had a visit at 11:30 and forgot to tell provider he does need a work stating that he can go back to work and would like a Materials engineer done

## 2019-06-28 NOTE — Progress Notes (Signed)
Chief Complaint  Patient presents with  . Cough    exposed to Covid  . Fatigue    Samantha Crimes here for URI complaints. Due to COVID-19 pandemic, we are interacting via phone. I verified patient's ID using 2 identifiers. Patient agreed to proceed with visit via this method. Patient is at home, I am at office. Patient and I are present for visit.    Duration: 1 week  Associated symptoms: sore throat and fatigue, cough Denies: sinus congestion, sinus pain, rhinorrhea, itchy watery eyes, ear pain, ear drainage, wheezing, myalgia and fevers Treatment to date: none Sick contacts: No  +Hx of COPD.   ROS:  Const: Denies fevers HEENT: As noted in HPI Lungs: +SOB  Past Medical History:  Diagnosis Date  . Alcoholism ([place])   . Anxiety   . COPD (chronic obstructive pulmonary disease) ([place])   . Diverticulitis 2011   resulted in partial colectomy  . Essential hypertension 06/06/2016  . Full dentures   . Gout   . Hematuria   . Wears glasses    Exam No conversational dyspnea Age appropriate judgment and insight Nml affect and mood  COPD exacerbation (HCC) - Plan: predniSONE (DELTASONE) 20 MG tablet, pred burst. If starting to have infectious s/s's, needs to let us know.   Orders as above. Continue to push fluids, practice good hand hygiene, cover mouth when coughing. Total time spent: 12 F/u prn. If starting to experience fevers, shaking, or shortness of breath, seek immediate care. Pt voiced understanding and agreement to the plan.  [place], DO 06/28/19 11:50 AM

## 2019-06-28 NOTE — Telephone Encounter (Signed)
Pt called to f/u on letter. Letter has been faxed to Attn: Olam Idler Fax# [phone]

## 2019-06-28 NOTE — Telephone Encounter (Signed)
I believe this was taken care of.

## 2019-06-30 ENCOUNTER — Other Ambulatory Visit: Payer: Self-pay | Admitting: Family Medicine

## 2019-06-30 DIAGNOSIS — M1000 Idiopathic gout, unspecified site: Secondary | ICD-10-CM

## 2019-07-05 ENCOUNTER — Encounter: Payer: Self-pay | Admitting: *Deleted

## 2019-07-05 ENCOUNTER — Telehealth: Payer: Self-pay | Admitting: *Deleted

## 2019-07-05 NOTE — Progress Notes (Signed)
Instruction letter mailed to patient after reviewing all instruction with patient over the phone. Verbalized understanding.

## 2019-07-05 NOTE — Telephone Encounter (Signed)
Call back to patient to see if ready to schedule Angiogram for August. He was to have cataract surgery in July. Instructed patient that he is to call this office to schedule procedure.

## 2019-07-08 ENCOUNTER — Telehealth: Payer: Self-pay | Admitting: *Deleted

## 2019-07-08 NOTE — Telephone Encounter (Signed)
Reviewed all pre-procedure instructions with patient over the phone including nasal swab screening. Verbalized understanding. All instructions/letter, map mailed to patient.

## 2019-07-10 ENCOUNTER — Other Ambulatory Visit: Payer: Self-pay | Admitting: Family Medicine

## 2019-07-15 ENCOUNTER — Other Ambulatory Visit: Payer: Self-pay | Admitting: Family Medicine

## 2019-07-15 DIAGNOSIS — G8929 Other chronic pain: Secondary | ICD-10-CM

## 2019-07-15 MED ORDER — HYDROCODONE-ACETAMINOPHEN 7.5-325 MG PO TABS: 1.0000 | ORAL_TABLET | Freq: Three times a day (TID) | ORAL | 0 refills | Status: DC | PRN

## 2019-07-15 NOTE — Telephone Encounter (Signed)
Medication Refill - Medication:HYDROcodone-acetaminophen (NORCO) 7.5-325 MG tablet   Has the patient contacted their pharmacy?Yes (Agent: If no, request that the patient contact the pharmacy for the refill.) (Agent: If yes, when and what did the pharmacy advise?)Contact PCP  Preferred Pharmacy (with phone number or street name):  [id number] [place] [place]. [phone] (Phone) [phone] (Fax)     Agent: Please be advised that RX refills may take up to 3 business days. We ask that you follow-up with your pharmacy.

## 2019-07-16 ENCOUNTER — Other Ambulatory Visit (HOSPITAL_COMMUNITY)
Admission: RE | Admit: 2019-07-16 | Discharge: 2019-07-16 | Disposition: A | Discharge status: Home / Self Care | Payer: Medicare Other | Source: Ambulatory Visit | Attending: Vascular Surgery | Admitting: Vascular Surgery

## 2019-07-16 DIAGNOSIS — Z01812 Encounter for preprocedural laboratory examination: Secondary | ICD-10-CM | POA: Diagnosis not present

## 2019-07-16 DIAGNOSIS — Z20828 Contact with and (suspected) exposure to other viral communicable diseases: Secondary | ICD-10-CM | POA: Diagnosis not present

## 2019-07-16 LAB — SARS CORONAVIRUS 2 (TAT 6-24 HRS): SARS Coronavirus 2: NEGATIVE

## 2019-07-19 ENCOUNTER — Ambulatory Visit (HOSPITAL_COMMUNITY)
Admission: RE | Admit: 2019-07-19 | Discharge: 2019-07-19 | Disposition: A | Discharge status: Home / Self Care | Payer: Medicare Other | Attending: Vascular Surgery | Admitting: Vascular Surgery

## 2019-07-19 ENCOUNTER — Encounter (HOSPITAL_COMMUNITY)
Admission: RE | Disposition: A | Discharge status: Home / Self Care | Payer: Self-pay | Source: Home / Self Care | Attending: Vascular Surgery

## 2019-07-19 ENCOUNTER — Other Ambulatory Visit: Payer: Self-pay

## 2019-07-19 ENCOUNTER — Telehealth: Payer: Self-pay | Admitting: *Deleted

## 2019-07-19 DIAGNOSIS — M109 Gout, unspecified: Secondary | ICD-10-CM | POA: Diagnosis not present

## 2019-07-19 DIAGNOSIS — I70213 Atherosclerosis of native arteries of extremities with intermittent claudication, bilateral legs: Secondary | ICD-10-CM | POA: Diagnosis not present

## 2019-07-19 DIAGNOSIS — I10 Essential (primary) hypertension: Secondary | ICD-10-CM | POA: Insufficient documentation

## 2019-07-19 DIAGNOSIS — Z886 Allergy status to analgesic agent status: Secondary | ICD-10-CM | POA: Diagnosis not present

## 2019-07-19 DIAGNOSIS — I739 Peripheral vascular disease, unspecified: Secondary | ICD-10-CM | POA: Insufficient documentation

## 2019-07-19 DIAGNOSIS — J449 Chronic obstructive pulmonary disease, unspecified: Secondary | ICD-10-CM | POA: Insufficient documentation

## 2019-07-19 DIAGNOSIS — F419 Anxiety disorder, unspecified: Secondary | ICD-10-CM | POA: Diagnosis not present

## 2019-07-19 DIAGNOSIS — Z79899 Other long term (current) drug therapy: Secondary | ICD-10-CM | POA: Insufficient documentation

## 2019-07-19 DIAGNOSIS — F17210 Nicotine dependence, cigarettes, uncomplicated: Secondary | ICD-10-CM | POA: Diagnosis not present

## 2019-07-19 HISTORY — PX: ABDOMINAL AORTOGRAM W/LOWER EXTREMITY: CATH118223

## 2019-07-19 LAB — POCT I-STAT, CHEM 8
BUN: 13 mg/dL (ref 8–23)
Calcium, Ion: 1.17 mmol/L (ref 1.15–1.40)
Chloride: 104 mmol/L (ref 98–111)
Creatinine, Ser: 1 mg/dL (ref 0.61–1.24)
Glucose, Bld: 102 mg/dL — ABNORMAL HIGH (ref 70–99)
HCT: 39 % (ref 39.0–52.0)
Hemoglobin: 13.3 g/dL (ref 13.0–17.0)
Potassium: 4.8 mmol/L (ref 3.5–5.1)
Sodium: 138 mmol/L (ref 135–145)
TCO2: 22 mmol/L (ref 22–32)

## 2019-07-19 SURGERY — ABDOMINAL AORTOGRAM W/LOWER EXTREMITY
Anesthesia: LOCAL | Laterality: Bilateral

## 2019-07-19 MED ORDER — LIDOCAINE HCL (PF) 1 % IJ SOLN
INTRAMUSCULAR | Status: AC
  Filled 2019-07-19: qty 30

## 2019-07-19 MED ORDER — SODIUM CHLORIDE 0.9% FLUSH: 3.0000 mL | Freq: Two times a day (BID) | INTRAVENOUS | Status: DC

## 2019-07-19 MED ORDER — HEPARIN (PORCINE) IN NACL 1000-0.9 UT/500ML-% IV SOLN
INTRAVENOUS | Status: AC
  Filled 2019-07-19: qty 1000

## 2019-07-19 MED ORDER — HEPARIN (PORCINE) IN NACL 1000-0.9 UT/500ML-% IV SOLN
INTRAVENOUS | Status: DC | PRN
  Administered 2019-07-19 (×2): 500 mL

## 2019-07-19 MED ORDER — SODIUM CHLORIDE 0.9 % IV SOLN: 250.0000 mL | INTRAVENOUS | Status: DC | PRN

## 2019-07-19 MED ORDER — MORPHINE SULFATE (PF) 10 MG/ML IV SOLN: 2.0000 mg | INTRAVENOUS | Status: DC | PRN

## 2019-07-19 MED ORDER — SODIUM CHLORIDE 0.9 % IV SOLN: INTRAVENOUS | Status: DC

## 2019-07-19 MED ORDER — IODIXANOL 320 MG/ML IV SOLN
INTRAVENOUS | Status: DC | PRN
  Administered 2019-07-19: 127 mL via INTRA_ARTERIAL

## 2019-07-19 MED ORDER — SODIUM CHLORIDE 0.9% FLUSH: 3.0000 mL | INTRAVENOUS | Status: DC | PRN

## 2019-07-19 MED ORDER — LIDOCAINE HCL (PF) 1 % IJ SOLN
INTRAMUSCULAR | Status: DC | PRN
  Administered 2019-07-19: 15 mL via INTRADERMAL

## 2019-07-19 MED ORDER — SODIUM CHLORIDE 0.9 % IV SOLN
INTRAVENOUS | Status: DC
  Administered 2019-07-19: 10:00:00 via INTRAVENOUS

## 2019-07-19 SURGICAL SUPPLY — 8 items
CATH ANGIO 5F PIGTAIL 65CM (CATHETERS) ×1 IMPLANT
KIT PV (KITS) ×2 IMPLANT
SHEATH PINNACLE 5F 10CM (SHEATH) ×1 IMPLANT
SHEATH PROBE COVER 6X72 (BAG) ×1 IMPLANT
SYR MEDRAD MARK V 150ML (SYRINGE) ×1 IMPLANT
TRANSDUCER W/STOPCOCK (MISCELLANEOUS) ×2 IMPLANT
TRAY PV CATH (CUSTOM PROCEDURE TRAY) ×2 IMPLANT
WIRE BENTSON .035X145CM (WIRE) ×1 IMPLANT

## 2019-07-19 NOTE — Op Note (Signed)
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  Flush occlusion left superficial femoral artery with reconstitution of a very diseased below-knee popliteal artery with runoff via a very diseased anterior tibial and peroneal artery  Operative details: After team informed consent, the patient taken the [place] lab.  The patient is placed in supine position Angie table.  Both groins were prepped and draped in usual sterile fashion.  Ultrasound used to identify the right common femoral artery and femoral bifurcation.  These were patent.  Image was obtained for the patient's record.  Local anesthesia was infiltrated over the right common femoral artery.  Introducer needle was used to cannulate the right common femoral artery using ultrasound guidance.  An 035 versa core wire was threaded up the abdominal aorta under fluoroscopic guidance.  A 5 French sheath placed over the guidewire in the right common femoral artery.  This was thoroughly flushed with heparinized saline.  5 French pigtail catheter was then advanced over the guidewire into the abdominal aorta abdominal aortogram obtained in AP projection.  Left and right renal arteries widely patent.  Infrarenal abdominal aorta is widely patent.  The left and right common external and internal iliac arteries are all widely patent.  Since previous CT scan had suggested iliac calcification I also did bilateral oblique views of the pelvis which again confirmed no significant aortoiliac occlusive disease.  After the pigtail catheter had been pulled down we did did bilateral lower extremity runoff views through the pigtail catheter.  In the left lower extremity, the left common femoral artery is patent.  The profunda is patent.  The left  superficial femoral artery has a flush occlusion at the origin.  The above-knee popliteal artery does reconstitute via profunda collaterals but is a very diseased vessel.  The below-knee popliteal artery also is diseased but he is a healthier vessel than the above-knee popliteal artery.  Posterior tibial artery is occluded.  There is a very diseased peroneal artery which extends all the way to the ankle.  There is an anterior tibial artery which is also very diseased but does give off runoff all the way to the left foot.  In the right lower extremity, the right common femoral artery is patent.  The right superficial femoral artery is diffusely diseased but patent.  The popliteal artery is occluded.  There is no obvious named tibial vessel in the right leg.  At this point pigtail catheter was removed over guidewire.  The 5 French sheath was left in place to pulled the holding area.  The patient today tolerated procedure well and there were no complications.  Patient was taken to the holding area in stable condition.  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  No real revascularization options other than potentially treating his right superficial femoral artery to increase a pressure had into the collaterals into his right leg.  In the left lower extremity he potentially could have a left femoral to below-knee popliteal bypass which would improve perfusion to his very diseased tibial vessels.  The patient will be scheduled for a follow-up appoint with Dr. early in the near future to see whether or not he is a candidate for bypass or continued medical management for now.  Ruta Hinds, MD [facility] Office: [phone] Pager: [phone]

## 2019-07-19 NOTE — H&P (Signed)
[facility] [place]  Patient name: Jonathan Harrington MRN: [id number]        DOB: 27-Jun-1956            Sex: male  REASON FOR CONSULT: Evaluation bilateral lower extremity arterial insufficiency  HPI: Jonathan Harrington is a 63 y.o. male, who is here today for discussion of his lower extremity symptoms.  He has multiple components of this.  He reports classic calf claudication bilaterally with walking.  This affects his calves and is relieved with rest.  He has no lower extremity tissue loss.  He does have a different component of numbness in his toes and the bottom of his feet.  This does appear to be more consistent with peripheral neuropathy.  He denies any prior history of cardiac disease.  Has a long history of cigarette abuse.      Past Medical History:  Diagnosis Date  . Alcoholism ([place])   . Anxiety   . COPD (chronic obstructive pulmonary disease) ([place])   . Diverticulitis 2011   resulted in partial colectomy  . Essential hypertension 06/06/2016  . Full dentures   . Gout   . Hematuria   . Wears glasses          Family History  Problem Relation Age of Onset  . Alzheimer's disease Mother   . Cancer Sister   . Neuropathy Neg Hx     SOCIAL HISTORY: Social History        Socioeconomic History  . Marital status: Widowed    Spouse name: died Mar 19, 2016  . Number of children: 1  . Years of education: Not on file  . Highest education level: Not on file  Occupational History  . Occupation: Manufacturing systems engineer  Social Needs  . Financial resource strain: Not on file  . Food insecurity:    Worry: Not on file    Inability: Not on file  . Transportation needs:    Medical: Not on file    Non-medical: Not on file  Tobacco Use  . Smoking status: Current Every Day Smoker    Packs/day: 2.00  . Smokeless tobacco: Never Used  Substance and Sexual Activity  . Alcohol use: Yes    Alcohol/week: 21.0 standard drinks    Types: 21 Cans of  beer per week    Comment: about 3 beers daily- history of heavy liquor abuse  . Drug use: No  . Sexual activity: Not on file  Lifestyle  . Physical activity:    Days per week: Not on file    Minutes per session: Not on file  . Stress: Not on file  Relationships  . Social connections:    Talks on phone: Not on file    Gets together: Not on file    Attends religious service: Not on file    Active member of club or organization: Not on file    Attends meetings of clubs or organizations: Not on file    Relationship status: Not on file  . Intimate partner violence:    Fear of current or ex partner: Not on file    Emotionally abused: Not on file    Physically abused: Not on file    Forced sexual activity: Not on file  Other Topics Concern  . Not on file  Social History Narrative   Lives alone following the death of his wife 03/19/16 following 40 years of marriage.   His son lives in the [place] area, and visits on weekends.  Allergies  Allergen Reactions  . Ibuprofen Swelling          Current Outpatient Medications  Medication Sig Dispense Refill  . allopurinol (ZYLOPRIM) 300 MG tablet Take 1 tablet (300 mg total) by mouth daily. 30 tablet 6  . ALPRAZolam (XANAX) 0.25 MG tablet Take 1/2 or 1 pill twice daily as needed for anxiety. 45 tablet 1  . amLODipine (NORVASC) 10 MG tablet Take 1 tablet (10 mg total) by mouth daily. 90 tablet 3  . cyanocobalamin 2000 MCG tablet Take 2,000 mcg by mouth daily.    . diphenhydrAMINE HCl, Sleep, (SLEEP AID) 25 MG CAPS Take by mouth.    . Doxylamine Succinate, Sleep, (SLEEP AID PO) Take 50 mg by mouth at bedtime.    . gabapentin (NEURONTIN) 300 MG capsule Take two (2) capusules three (3) times a day. 180 capsule 1  . HYDROcodone-acetaminophen (NORCO) 7.5-325 MG tablet Take 1 tablet by mouth every 8 (eight) hours as needed for moderate pain. 70 tablet 0  . lisinopril (PRINIVIL,ZESTRIL) 40 MG tablet  TAKE 1 TABLET BY MOUTH EVERY DAY 90 tablet 3  . Multiple Vitamins-Minerals (MULTIVITAMIN WITH MINERALS) tablet Take 1 tablet by mouth daily. Reported on 03/21/2016    . rosuvastatin (CRESTOR) 10 MG tablet Take 1 tablet (10 mg total) by mouth daily. 90 tablet 3  . tamsulosin (FLOMAX) 0.4 MG CAPS capsule Take 1 capsule (0.4 mg total) by mouth daily after breakfast. 30 capsule 0   No current facility-administered medications for this visit.     REVIEW OF SYSTEMS:  [X]  denotes positive finding, [ ]  denotes negative finding Cardiac  Comments:  Chest pain or chest pressure:    Shortness of breath upon exertion:    Short of breath when lying flat:    Irregular heart rhythm:        Vascular    Pain in calf, thigh, or hip brought on by ambulation: x   Pain in feet at night that wakes you up from your sleep:  x   Blood clot in your veins:    Leg swelling:  x       Pulmonary    Oxygen at home:    Productive cough:     Wheezing:         Neurologic    Sudden weakness in arms or legs:  x   Sudden numbness in arms or legs:  x   Sudden onset of difficulty speaking or slurred speech:    Temporary loss of vision in one eye:     Problems with dizziness:         Gastrointestinal    Blood in stool:     Vomited blood:         Genitourinary    Burning when urinating:     Blood in urine:        Psychiatric    Major depression:         Hematologic    Bleeding problems:    Problems with blood clotting too easily:        Skin    Rashes or ulcers:        Constitutional    Fever or chills:      PHYSICAL EXAM:   Vitals:   07/19/19 0926  BP: (!) 144/68  Pulse: 98  Resp: 20  Temp: 98.5 F (36.9 C)  TempSrc: Oral  SpO2: 97%  Weight: 92.1 kg  Height: 6' (1.829 m)    GENERAL: The patient is  a well-nourished male, in no acute distress. The vital signs are documented  above. CARDIOVASCULAR: 2+ radial and 2+ femoral pulses.  Absent popliteal and distal pulses bilaterally.  Carotid arteries without bruits bilaterally PULMONARY: There is good air exchange  ABDOMEN: Soft and non-tender  MUSCULOSKELETAL: There are no major deformities or cyanosis. NEUROLOGIC: No focal weakness or paresthesias are detected. SKIN: There are no ulcers or rashes noted. PSYCHIATRIC: The patient has a normal affect.  DATA:  Lower extremity noninvasive studies reveal an ankle arm index of 0.64 on the right and 0.71 on the left  He did have a CT scan in January of this year for evaluation of renal stone.  This shows extensive calcification of his aorta iliac segments and probable occlusion at the origin of his superficial femoral arteries bilaterally  MEDICAL ISSUES: I had a long discussion with the patient regarding this.  I explained the relationship of cigarette smoking and his difficulty with walking and the progression of his atherosclerotic disease.  He currently has moderate limitation from his claudication.  He also has a significant limitation from his COPD.  I have recommended a very conservative approach.  If he has progression of symptoms, would proceed with arteriography.  He may have iliac disease that would be amenable to angioplasty but suspected superficial with occlusive disease is the main culprit.  He was comfortable with this discussion will notify should he develop any progressive rest pain or tissue loss   Larina Earthlyodd F. Early, MD FACS [facility] Tel [phone] Pager [phone]  History and exam findings as above.  Procedure details risks benefits discussed with pt he wishes to proceed with agram runoff possible intervention  Fabienne Brunsharles Racer Quam, MD [facility] Office: [phone] Pager: [phone]

## 2019-07-19 NOTE — Discharge Instructions (Signed)
Femoral Site Care °This sheet gives you information about how to care for yourself after your procedure. Your health care provider may also give you more specific instructions. If you have problems or questions, contact your health care provider. °What can I expect after the procedure? °After the procedure, it is common to have: °· Bruising that usually fades within 1-2 weeks. °· Tenderness at the site. °Follow these instructions at home: °Wound care °· Follow instructions from your health care provider about how to take care of your insertion site. Make sure you: °? Wash your hands with soap and water before you change your bandage (dressing). If soap and water are not available, use hand sanitizer. °? Change your dressing as told by your health care provider. °? Leave stitches (sutures), skin glue, or adhesive strips in place. These skin closures may need to stay in place for 2 weeks or longer. If adhesive strip edges start to loosen and curl up, you may trim the loose edges. Do not remove adhesive strips completely unless your health care provider tells you to do that. °· Do not take baths, swim, or use a hot tub until your health care provider approves. °· You may shower 24-48 hours after the procedure or as told by your health care provider. °? Gently wash the site with plain soap and water. °? Pat the area dry with a clean towel. °? Do not rub the site. This may cause bleeding. °· Do not apply powder or lotion to the site. Keep the site clean and dry. °· Check your femoral site every day for signs of infection. Check for: °? Redness, swelling, or pain. °? Fluid or blood. °? Warmth. °? Pus or a bad smell. °Activity °· For the first 2-3 days after your procedure, or as long as directed: °? Avoid climbing stairs as much as possible. °? Do not squat. °· Do not lift anything that is heavier than 10 lb (4.5 kg), or the limit that you are told, until your health care provider says that it is safe. °· Rest as  directed. °? Avoid sitting for a long time without moving. Get up to take short walks every 1-2 hours. °· Do not drive for 24 hours if you were given a medicine to help you relax (sedative). °General instructions °· Take over-the-counter and prescription medicines only as told by your health care provider. °· Keep all follow-up visits as told by your health care provider. This is important. °Contact a health care provider if you have: °· A fever or chills. °· You have redness, swelling, or pain around your insertion site. °Get help right away if: °· The catheter insertion area swells very fast. °· You pass out. °· You suddenly start to sweat or your skin gets clammy. °· The catheter insertion area is bleeding, and the bleeding does not stop when you hold steady pressure on the area. °· The area near or just beyond the catheter insertion site becomes pale, cool, tingly, or numb. °These symptoms may represent a serious problem that is an emergency. Do not wait to see if the symptoms will go away. Get medical help right away. Call your local emergency services (911 in the [place] Do not drive yourself to the hospital. °Summary °· After the procedure, it is common to have bruising that usually fades within 1-2 weeks. °· Check your femoral site every day for signs of infection. °· Do not lift anything that is heavier than 10 lb (4.5 kg), or the   limit that you are told, until your health care provider says that it is safe. °This information is not intended to replace advice given to you by your health care provider. Make sure you discuss any questions you have with your health care provider. °Document Released: 08/01/2014 Document Revised: 12/11/2017 Document Reviewed: 12/11/2017 °Elsevier Patient Education © 2020 [employer] ° °

## 2019-07-19 NOTE — Telephone Encounter (Signed)
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  No real revascularization options other than potentially treating his right superficial femoral artery to increase a pressure had into the collaterals into his right leg.  In the left lower extremity he potentially could have a left femoral to below-knee popliteal bypass which would improve perfusion to his very diseased tibial vessels.  The patient will be scheduled for a follow-up appoint with Dr. Donnetta Hutching in the near future to see whether or not he is a candidate for bypass or continued medical management for now.  Please get him an appointment with Dr. Donnetta Hutching in 1 to 2 weeks with bilateral ABIs at that appointment since it has been almost 6 months since his prior ABIs.  Ruta Hinds, MD [facility] [place] Office: [phone] Pager: [phone]

## 2019-07-19 NOTE — Progress Notes (Signed)
Site area: Scientific laboratory technician Prior to Removal:  Level 0 Pressure Applied For: 25 min Manual:   yes Patient Status During Pull:  A/O Post Pull Site:  Level 0 Post Pull Instructions Given:  Post instructions given and pt understands Post Pull Pulses Present: Rt dp/pt doppler Dressing Applied:  tegaderm and a 4x4 Bedrest begins @ 14:10 Comments: Pt leaves cath lab holding area in stable condition. Rt groin is unremarkable,soft no bruising or  hematoma. Dressing to Rt groin site is CDI.

## 2019-07-20 NOTE — Telephone Encounter (Signed)
Patient states he has a lot on his plate right now with his health and would like to wait until he sees his specialist to schedule a follow up

## 2019-07-21 ENCOUNTER — Other Ambulatory Visit: Payer: Self-pay | Admitting: Family Medicine

## 2019-07-21 DIAGNOSIS — I10 Essential (primary) hypertension: Secondary | ICD-10-CM

## 2019-07-22 ENCOUNTER — Encounter (HOSPITAL_COMMUNITY): Payer: Self-pay | Admitting: Vascular Surgery

## 2019-07-23 NOTE — Progress Notes (Addendum)
[facility] [address], [address] [place], [place] [address] [phone] [phone]  Date:  07/24/2019   Name:  Jonathan Harrington   DOB:  05/17/1956   MRN:  [id number]  PCP:  Pearline Cablesopland,  C, MD    Chief Complaint: Post Op Follow Up   History of Present Illness:  Jonathan Harrington is a 63 y.o. very pleasant male patient who presents with the following:  Pt with history of HTN, chronic pain due to neuropathy and back pain, smoking/ copd, alcohol use At our last visit in February we talked about his leg pain with walking and chronic back pain, positive ABI testing and planned for visit with vascular surgery He was seen virtually for a COPD exacerbation about one month ago by Dr. Carmelia RollerWendling   He has bilateral claudication and recently underwent an abdominal aortogram with bilateral LE runoff per Dr. 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  No real revascularization options other than potentially treating his right superficial femoral artery to increase a pressure had into the collaterals into his right leg. In the left lower extremity he potentially could have a left femoral to below-knee popliteal bypass which would improve perfusion to his very diseased tibial vessels.  The patient will be scheduled for a follow-up appoint with Dr. Arbie CookeyEarly in the near future to see whether or not he is a candidate for bypass or continued medical management for now.    Colon cancer screen:done in 2009.  He did have a couple of polyps.  Will refer back to GI Can start pneumonia series now since he turned 89 Takes chronic hydrocodone and alprazolam for back pain and anxiety, as well as his leg pain He has claudication that limits his ability to walk or stand- he can walk about 200 yards prior to onset of pain. He does his work sitting so this is good He is looking forward to his  follow-up visit with Dr. early, he understands they will discuss operative versus medical treatment for his leg disease.  He does really want to quit smoking, as he knows this may make his legs worse He had catarat surgery and feels like this did help him.  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 CT of his lungs done in January was benign  lisiopril crestor flomax gabaentin   07/15/2019  2   07/15/2019  Hydrocodone-Acetamin 7.5-325  70.00 24 Je Cop   [id number]   [place] (2372)   0  21.88 MME  Private Pay   [place]  06/21/2019  2   06/21/2019  Alprazolam 0.25 MG Tablet  45.00 23 Je Cop   [id number]   [place] (2372)   0  0.98 LME  Medicare   [place]  06/03/2019  2   06/03/2019  Hydrocodone-Acetamin 7.5-325  70.00 30 Je Cop   [id number]   [place] (2372)   0  17.50 MME  Private Pay   [place]  05/07/2019  2   04/09/2019  Alprazolam 0.25 MG Tablet  45.00 30 Je Cop   [id number]   [place] (2372)   1  0.75 LME  Medicare     04/29/2019  2   04/29/2019  Hydrocodone-Acetamin 7.5-325  70.00 24 Je Cop   [id number]   [place] (2372)   0  21.88 MME  Private Pay   [facility]  04/09/2019  2   04/09/2019  Alprazolam 0.25 MG Tablet  45.00 30 Je Cop   [id number]   [place] (2372)   0  0.75 LME  Medicare   [place]  04/02/2019  2   04/02/2019  Hydrocodone-Acetamin 7.5-325  70.00 24 Je Cop   [id number]   [place] (2372)   0  21.88 MME  Private Pay   [place]  03/04/2019  1   01/08/2019  Alprazolam 0.25 MG Tablet  45.00 23 Je Cop   [id number]   [place] (2372)   1  0.98 LME  Comm Ins   [place]  02/05/2019  1   02/05/2019  Hydrocodone-Acetamin 7.5-325  70.00 24 Je Cop   [id number]   [place] (2372)   0  21.88 MME  Comm Ins   [place]  01/10/2019  1   01/08/2019  Alprazolam 0.25 MG Tablet  45.00 23 Je Cop   [id number]   [place] (2372)   0  0.98 LME  Comm Ins   [place]  12/25/2018  1   12/25/2018  Hydrocodone-Acetamin 7.5-325  70.00 23 Je Cop   [id number]   [place] (2372)   0  22.83 MME  Comm Ins   [place]  12/13/2018  1   11/29/2018  Alprazolam 0.25 MG Tablet  45.00 30 Je Cop   [id number]   [place]  (2372)   0  0.75 LME        Patient Active Problem List   Diagnosis Date Noted  . Neuropathy 06/04/2019  . Alcoholism (HCC) 11/19/2018  . Essential hypertension 06/06/2016  . Smoker 05/12/2016  . Left inguinal hernia 05/23/2014  . Anxiety state, unspecified 09/17/2013  . Gout 01/17/2013  . DIVERTICULOSIS-COLON 08/13/2010  . DIVERTICULITIS, COLON 08/13/2010  . ABDOMINAL PAIN-LLQ 08/13/2010  . Nonspecific (abnormal) findings on radiological and other examination of body structure 08/13/2010  . PERSONAL HX COLONIC POLYPS 08/13/2010  . NONSPCIFC ABN FINDING RAD & OTH EXAM LUNG FIELD 08/13/2010    Past Medical History:  Diagnosis Date  . Alcoholism (HCC)   . Anxiety   . COPD (chronic obstructive pulmonary disease) (HCC)   . Diverticulitis 2011   resulted in partial colectomy  . Essential hypertension 06/06/2016  . Full dentures   . Gout   . Hematuria   . Wears glasses     Past Surgical History:  Procedure Laterality Date  . ABDOMINAL AORTOGRAM W/LOWER EXTREMITY Bilateral 07/19/2019   Procedure: ABDOMINAL AORTOGRAM W/LOWER EXTREMITY;  Surgeon: Sherren KernsFields, Charles E, MD;  [facility];  Service: Cardiovascular;  Laterality: Bilateral;  . COLON SURGERY  2011   partial colectomy  . COLONOSCOPY    . FINGER ARTHROPLASTY  1990   rt index fx  . HERNIA REPAIR    . INGUINAL HERNIA REPAIR Left 05/27/2014   Procedure: LEFT INGUINAL HERNIA REPAIR WITH MESH;  Surgeon: Wilmon ArmsMatthew K. Corliss Skainssuei, MD;  Location: [facility];  Service: General;  Laterality: Left;  . INSERTION OF MESH Left 05/27/2014   Procedure: INSERTION OF MESH;  Surgeon: Wilmon ArmsMatthew K. Corliss Skainssuei, MD;  Location: [facility];  Service: General;  Laterality: Left;    Social History   Tobacco Use  . Smoking status: Current Every Day Smoker    Packs/day: 2.00  . Smokeless tobacco: Never Used  Substance Use Topics  . Alcohol use: Yes    Alcohol/week: 21.0 standard drinks    Types: 21 Cans of beer  per week    Comment: about 3 beers daily- history of heavy  liquor abuse  . Drug use: No    Family History  Problem Relation Age of Onset  . Alzheimer's disease Mother   . Cancer Sister   . Neuropathy Neg Hx     Allergies  Allergen Reactions  . Ibuprofen Swelling    Medication list has been reviewed and updated.  Current Outpatient Medications on File Prior to Visit  Medication Sig Dispense Refill  . albuterol (VENTOLIN HFA) 108 (90 Base) MCG/ACT inhaler Inhale 2 puffs into the lungs every 6 (six) hours as needed for wheezing or shortness of breath. 1 Inhaler 6  . allopurinol (ZYLOPRIM) 300 MG tablet TAKE 1 TABLET BY MOUTH EVERY DAY (Patient taking differently: Take 300 mg by mouth daily. ) 90 tablet 1  . ALPRAZolam (XANAX) 0.25 MG tablet TAKE 1/2 OR 1 PILL TWICE DAILY AS NEEDED FOR ANXIETY. (Patient taking differently: Take 0.25 mg by mouth See admin instructions. Take 1 tablet (0.25 mg) by mouth scheduled every morning & may repeat dose in the evening as needed for anxiety.) 45 tablet 1  . amLODipine (NORVASC) 10 MG tablet TAKE 1 TABLET BY MOUTH EVERY DAY 90 tablet 0  . cyanocobalamin 2000 MCG tablet Take 2,000 mcg by mouth daily.    . diphenhydrAMINE HCl, Sleep, (SLEEP-AID) 50 MG CAPS Take 50 mg by mouth at bedtime.    . gabapentin (NEURONTIN) 300 MG capsule TAKE 2 CAPSULES BY MOUTH 3 TIMES A DAY (Patient taking differently: Take 600 mg by mouth 3 (three) times daily. ) 540 capsule 1  . HYDROcodone-acetaminophen (NORCO) 7.5-325 MG tablet Take 1 tablet by mouth every 8 (eight) hours as needed for moderate pain. (Patient taking differently: Take 1 tablet by mouth See admin instructions. Take 1 tablet by mouth scheduled in the morning, may repeat 2 doses as needed for back pain.) 70 tablet 0  . lisinopril (PRINIVIL,ZESTRIL) 40 MG tablet TAKE 1 TABLET BY MOUTH EVERY DAY (Patient taking differently: Take 40 mg by mouth daily. ) 90 tablet 3  . Multiple Vitamins-Minerals (MULTIVITAMIN WITH  MINERALS) tablet Take 1 tablet by mouth daily. Reported on 03/21/2016    . rosuvastatin (CRESTOR) 10 MG tablet Take 1 tablet (10 mg total) by mouth daily. 90 tablet 3  . tamsulosin (FLOMAX) 0.4 MG CAPS capsule Take 1 capsule (0.4 mg total) by mouth daily after breakfast. 30 capsule 0  . triamcinolone cream (KENALOG) 0.1 % Apply 1 application topically 2 (two) times daily. Use as needed for eczema (Patient taking differently: Apply 1 application topically 2 (two) times daily as needed (eczema on leg). ) 90 g 2   No current facility-administered medications on file prior to visit.     Review of Systems:  As per HPI- otherwise negative.  No fever or chills Physical Examination: Vitals:   07/24/19 1046  BP: 130/80  Pulse: 91  Resp: 16  Temp: 98.4 F (36.9 C)  SpO2: 97%   Vitals:   07/24/19 1046  Weight: 200 lb (90.7 kg)  Height: 6' (1.829 m)   Body mass index is 27.12 kg/m. Ideal Body Weight: Weight in (lb) to have BMI = 25: 183.9  GEN: WDWN, NAD, Non-toxic, A & O x 3, mild overweight, looks well today  HEENT: Atraumatic, Normocephalic. Neck supple. No masses, No LAD. Ears and Nose: No external deformity. CV: RRR, No M/G/R. No JVD. No thrill. No extra heart sounds. PULM: CTA B, no wheezes, crackles, rhonchi. No retractions. No resp. distress. No accessory muscle use. ABD: S, NT, ND EXTR: No  c/c/e NEURO Normal gait.  PSYCH: Normally interactive. Conversant. Not depressed or anxious appearing.  Calm demeanor.    Assessment and Plan:   ICD-10-CM   1. GAD (generalized anxiety disorder)  F41.1 ALPRAZolam (XANAX) 0.25 MG tablet  2. Intermittent claudication (HCC)  I73.9   3. Chronic bilateral low back pain without sciatica  M54.5 HYDROcodone-acetaminophen (NORCO) 7.5-325 MG tablet   G89.29   4. Chronic midline low back pain without sciatica  M54.5    G89.29   5. Medication monitoring encounter  Z51.81   6. Essential hypertension  I10 CBC  7. Screening for colon cancer   Z12.11 Ambulatory referral to Gastroenterology  8. Immunization due  Z23 Pneumococcal conjugate vaccine 13-valent IM  9. Screening for hyperlipidemia  Z13.220 Lipid panel  10. Elevated glucose  R73.09 Hemoglobin A1c  11. Screening for prostate cancer  Z12.5 PSA, Medicare ( [place] only)  12. Accelerated hypertension  I10 lisinopril (ZESTRIL) 40 MG tablet  13. Dyslipidemia  E78.5 rosuvastatin (CRESTOR) 10 MG tablet  14. Grief  F43.21 ALPRAZolam (XANAX) 0.25 MG tablet   Arliss is following up today.  He seems in good spirits, calm Refilled his pain medication and alprazolam after reviewing controlled substance database Blood pressure under good control on current regimen Gave Prevnar, encouraged Shingrix Encouraged him to quit smoking Referral to gastroenterology for colon cancer screening Will plan further follow- up pending labs.   Follow-up: No follow-ups on file.  Meds ordered this encounter  Medications  . lisinopril (ZESTRIL) 40 MG tablet    Sig: Take 1 tablet (40 mg total) by mouth daily.    Dispense:  90 tablet    Refill:  3  . rosuvastatin (CRESTOR) 10 MG tablet    Sig: Take 1 tablet (10 mg total) by mouth daily.    Dispense:  90 tablet    Refill:  3  . ALPRAZolam (XANAX) 0.25 MG tablet    Sig: Take 1 tablet (0.25 mg total) by mouth See admin instructions. Take 1 tablet (0.25 mg) by mouth scheduled every morning & may repeat dose in the evening as needed for anxiety.    Dispense:  45 tablet    Refill:  1    Not to exceed 4 additional fills before 10/06/2019  . HYDROcodone-acetaminophen (NORCO) 7.5-325 MG tablet    Sig: Take 1 tablet by mouth every 8 (eight) hours as needed for moderate pain.    Dispense:  70 tablet    Refill:  0    Hold till time for refill   Orders Placed This Encounter  Procedures  . Pneumococcal conjugate vaccine 13-valent IM  . CBC  . Lipid panel  . Hemoglobin A1c  . PSA, Medicare (  [place] only)  . Ambulatory referral to  Gastroenterology    @SIGN @    Signed Abbe Amsterdam, MD  Received his labs, letter to pt  Results for orders placed or performed in visit on 07/24/19  CBC  Result Value Ref Range   WBC 6.3 4.0 - 10.5 K/uL   RBC 4.22 4.22 - 5.81 Mil/uL   Platelets 207.0 150.0 - 400.0 K/uL   Hemoglobin 13.5 13.0 - 17.0 g/dL   HCT 16.1 09.6 - 04.5 %   MCV 94.1 78.0 - 100.0 fl   MCHC 34.1 30.0 - 36.0 g/dL   RDW 40.9 81.1 - 91.4 %  Lipid panel  Result Value Ref Range   Cholesterol 113 0 - 200 mg/dL   Triglycerides (H) 0.0 - 149.0 mg/dL  416.0 Triglyceride is over 400; calculations on Lipids are invalid.   HDL 25.30 (L) >39.00 mg/dL   Total CHOL/HDL Ratio 4   Hemoglobin A1c  Result Value Ref Range   Hgb A1c MFr Bld 5.7 4.6 - 6.5 %  PSA, Medicare ( [place] [facility] only)  Result Value Ref Range   PSA 0.43 0.10 - 4.00 ng/ml  LDL cholesterol, direct  Result Value Ref Range   Direct LDL 50.0 mg/dL   Lab Results  Component Value Date   PSA 0.43 07/24/2019   PSA 0.99 06/20/2018   PSA 0.84 08/27/2014

## 2019-07-24 ENCOUNTER — Encounter: Payer: Self-pay | Admitting: Family Medicine

## 2019-07-24 ENCOUNTER — Other Ambulatory Visit: Payer: Self-pay

## 2019-07-24 ENCOUNTER — Ambulatory Visit (INDEPENDENT_AMBULATORY_CARE_PROVIDER_SITE_OTHER): Payer: Medicare Other | Admitting: Family Medicine

## 2019-07-24 ENCOUNTER — Encounter: Payer: Self-pay | Admitting: Internal Medicine

## 2019-07-24 VITALS — BP 130/80 | HR 91 | Temp 98.4°F | Resp 16 | Ht 72.0 in | Wt 200.0 lb

## 2019-07-24 DIAGNOSIS — R7309 Other abnormal glucose: Secondary | ICD-10-CM

## 2019-07-24 DIAGNOSIS — Z23 Encounter for immunization: Secondary | ICD-10-CM | POA: Diagnosis not present

## 2019-07-24 DIAGNOSIS — Z125 Encounter for screening for malignant neoplasm of prostate: Secondary | ICD-10-CM | POA: Diagnosis not present

## 2019-07-24 DIAGNOSIS — Z5181 Encounter for therapeutic drug level monitoring: Secondary | ICD-10-CM | POA: Diagnosis not present

## 2019-07-24 DIAGNOSIS — F4321 Adjustment disorder with depressed mood: Secondary | ICD-10-CM | POA: Diagnosis not present

## 2019-07-24 DIAGNOSIS — I10 Essential (primary) hypertension: Secondary | ICD-10-CM

## 2019-07-24 DIAGNOSIS — E785 Hyperlipidemia, unspecified: Secondary | ICD-10-CM | POA: Diagnosis not present

## 2019-07-24 DIAGNOSIS — G8929 Other chronic pain: Secondary | ICD-10-CM

## 2019-07-24 DIAGNOSIS — M545 Low back pain: Secondary | ICD-10-CM | POA: Diagnosis not present

## 2019-07-24 DIAGNOSIS — Z13220 Encounter for screening for lipoid disorders: Secondary | ICD-10-CM | POA: Diagnosis not present

## 2019-07-24 DIAGNOSIS — F411 Generalized anxiety disorder: Secondary | ICD-10-CM | POA: Diagnosis not present

## 2019-07-24 DIAGNOSIS — I70213 Atherosclerosis of native arteries of extremities with intermittent claudication, bilateral legs: Secondary | ICD-10-CM

## 2019-07-24 DIAGNOSIS — I739 Peripheral vascular disease, unspecified: Secondary | ICD-10-CM | POA: Diagnosis not present

## 2019-07-24 DIAGNOSIS — Z1211 Encounter for screening for malignant neoplasm of colon: Secondary | ICD-10-CM

## 2019-07-24 LAB — LDL CHOLESTEROL, DIRECT: Direct LDL: 50 mg/dL

## 2019-07-24 LAB — CBC
HCT: 39.7 % (ref 39.0–52.0)
Hemoglobin: 13.5 g/dL (ref 13.0–17.0)
MCHC: 34.1 g/dL (ref 30.0–36.0)
MCV: 94.1 fl (ref 78.0–100.0)
Platelets: 207 10*3/uL (ref 150.0–400.0)
RBC: 4.22 Mil/uL (ref 4.22–5.81)
RDW: 14.8 % (ref 11.5–15.5)
WBC: 6.3 10*3/uL (ref 4.0–10.5)

## 2019-07-24 LAB — PSA, MEDICARE: PSA: 0.43 ng/ml (ref 0.10–4.00)

## 2019-07-24 LAB — LIPID PANEL
Cholesterol: 113 mg/dL (ref 0–200)
HDL: 25.3 mg/dL — ABNORMAL LOW (ref >39.00)
Total CHOL/HDL Ratio: 4
Triglycerides: 416 mg/dL — ABNORMAL HIGH (ref 0.0–149.0)

## 2019-07-24 LAB — HEMOGLOBIN A1C: Hgb A1c MFr Bld: 5.7 % (ref 4.6–6.5)

## 2019-07-24 MED ORDER — HYDROCODONE-ACETAMINOPHEN 7.5-325 MG PO TABS: 1.0000 | ORAL_TABLET | Freq: Three times a day (TID) | ORAL | 0 refills | Status: DC | PRN

## 2019-07-24 MED ORDER — LISINOPRIL 40 MG PO TABS: 40.0000 mg | ORAL_TABLET | Freq: Every day | ORAL | 3 refills | Status: DC

## 2019-07-24 MED ORDER — ROSUVASTATIN CALCIUM 10 MG PO TABS: 10.0000 mg | ORAL_TABLET | Freq: Every day | ORAL | 3 refills | Status: DC

## 2019-07-24 MED ORDER — ALPRAZOLAM 0.25 MG PO TABS: 0.2500 mg | ORAL_TABLET | ORAL | 1 refills | Status: DC

## 2019-07-24 NOTE — Patient Instructions (Signed)
Great to see you today- I am glad that you are doing well! Best of luck with quitting smoking I will be in touch with your labs asap You got your first pneumonia vaccine today- please get a flu shot later this fall as well I will refer you back to GI for your colon cancer screening  I refilled your pain medication and anxiety medication. Can pick up when due

## 2019-07-31 ENCOUNTER — Other Ambulatory Visit: Payer: Self-pay

## 2019-07-31 DIAGNOSIS — I70213 Atherosclerosis of native arteries of extremities with intermittent claudication, bilateral legs: Secondary | ICD-10-CM

## 2019-08-02 ENCOUNTER — Telehealth: Payer: Self-pay

## 2019-08-02 DIAGNOSIS — E785 Hyperlipidemia, unspecified: Secondary | ICD-10-CM

## 2019-08-02 NOTE — Telephone Encounter (Signed)
Copied from [place] [id number]. Topic: General - Inquiry >> Aug 02, 2019  2:49 PM Mcneil, Ja-Kwan wrote: Reason for CRM: Pt stated he received a letter informing him that his cholesterol is still high and if he is taking his medication he will need to increase it. Pt requests call back in regards to increasing the rosuvastatin (CRESTOR) 10 MG tablet.

## 2019-08-06 ENCOUNTER — Encounter: Payer: Self-pay | Admitting: Vascular Surgery

## 2019-08-06 ENCOUNTER — Ambulatory Visit (INDEPENDENT_AMBULATORY_CARE_PROVIDER_SITE_OTHER): Payer: Medicare Other | Admitting: Vascular Surgery

## 2019-08-06 ENCOUNTER — Other Ambulatory Visit: Payer: Self-pay

## 2019-08-06 ENCOUNTER — Ambulatory Visit (HOSPITAL_COMMUNITY)
Admission: RE | Admit: 2019-08-06 | Discharge: 2019-08-06 | Disposition: A | Discharge status: Home / Self Care | Payer: Medicare Other | Source: Ambulatory Visit | Attending: Vascular Surgery | Admitting: Vascular Surgery

## 2019-08-06 VITALS — BP 125/66 | HR 90 | Temp 97.7°F | Resp 20 | Ht 72.0 in | Wt 203.8 lb

## 2019-08-06 DIAGNOSIS — I70213 Atherosclerosis of native arteries of extremities with intermittent claudication, bilateral legs: Secondary | ICD-10-CM | POA: Diagnosis not present

## 2019-08-06 MED ORDER — ROSUVASTATIN CALCIUM 20 MG PO TABS: 20.0000 mg | ORAL_TABLET | Freq: Every day | ORAL | 3 refills | Status: DC

## 2019-08-06 NOTE — Progress Notes (Signed)
[facility] [place]  Patient name: Jonathan GainFarrell D Susan MRN: [id number] DOB: 06/20/1956 Sex: male  REASON FOR VISIT: Discuss recent outpatient arteriogram on 07/19/1999  HPI: Jonathan Harrington is a 63 y.o. male here today for continued follow-up and discussion of his limiting claudication both lower extremities.  I had seen him initially in March 2020.  At that time he was having bilateral claudication I recommended smoking cessation and walking program and observation.  He had called with worsening in symptoms and underwent outpatient arteriography with Dr. Darrick Pennafields on 07/19/2019.  He is here for further discussion.  He reports that his discomfort is from his knees distally bilaterally and this is quite limiting to him.  He also has very straightforward neuropathy in both feet.  He reports a feeling of a cushion under the balls of his feet when he is walking.  Past Medical History:  Diagnosis Date  . Alcoholism (HCC)   . Anxiety   . COPD (chronic obstructive pulmonary disease) (HCC)   . Diverticulitis 2011   resulted in partial colectomy  . Essential hypertension 06/06/2016  . Full dentures   . Gout   . Hematuria   . Wears glasses     Family History  Problem Relation Age of Onset  . Alzheimer's disease Mother   . Cancer Sister   . Neuropathy Neg Hx     SOCIAL HISTORY: Social History   Tobacco Use  . Smoking status: Current Every Day Smoker    Packs/day: 2.00  . Smokeless tobacco: Never Used  Substance Use Topics  . Alcohol use: Yes    Alcohol/week: 21.0 standard drinks    Types: 21 Cans of beer per week    Comment: about 3 beers daily- history of heavy liquor abuse    Allergies  Allergen Reactions  . Ibuprofen Swelling    Current Outpatient Medications  Medication Sig Dispense Refill  . albuterol (VENTOLIN HFA) 108 (90 Base) MCG/ACT inhaler Inhale 2 puffs into the lungs every 6 (six) hours as needed for wheezing or  shortness of breath. 1 Inhaler 6  . allopurinol (ZYLOPRIM) 300 MG tablet TAKE 1 TABLET BY MOUTH EVERY DAY (Patient taking differently: Take 300 mg by mouth daily. ) 90 tablet 1  . ALPRAZolam (XANAX) 0.25 MG tablet Take 1 tablet (0.25 mg total) by mouth See admin instructions. Take 1 tablet (0.25 mg) by mouth scheduled every morning & may repeat dose in the evening as needed for anxiety. 45 tablet 1  . amLODipine (NORVASC) 10 MG tablet TAKE 1 TABLET BY MOUTH EVERY DAY 90 tablet 0  . cyanocobalamin 2000 MCG tablet Take 2,000 mcg by mouth daily.    . diphenhydrAMINE HCl, Sleep, (SLEEP-AID) 50 MG CAPS Take 50 mg by mouth at bedtime.    . gabapentin (NEURONTIN) 300 MG capsule TAKE 2 CAPSULES BY MOUTH 3 TIMES A DAY (Patient taking differently: Take 600 mg by mouth 3 (three) times daily. ) 540 capsule 1  . HYDROcodone-acetaminophen (NORCO) 7.5-325 MG tablet Take 1 tablet by mouth every 8 (eight) hours as needed for moderate pain. 70 tablet 0  . lisinopril (ZESTRIL) 40 MG tablet Take 1 tablet (40 mg total) by mouth daily. 90 tablet 3  . Multiple Vitamins-Minerals (MULTIVITAMIN WITH MINERALS) tablet Take 1 tablet by mouth daily. Reported on 03/21/2016    . rosuvastatin (CRESTOR) 10 MG tablet Take 1 tablet (10 mg total) by mouth daily. 90 tablet 3  . tamsulosin (FLOMAX) 0.4 MG CAPS capsule  Take 1 capsule (0.4 mg total) by mouth daily after breakfast. 30 capsule 0  . triamcinolone cream (KENALOG) 0.1 % Apply 1 application topically 2 (two) times daily. Use as needed for eczema (Patient taking differently: Apply 1 application topically 2 (two) times daily as needed (eczema on leg). ) 90 g 2   No current facility-administered medications for this visit.     REVIEW OF SYSTEMS:  [X]  denotes positive finding, [ ]  denotes negative finding Cardiac  Comments:  Chest pain or chest pressure:    Shortness of breath upon exertion:    Short of breath when lying flat:    Irregular heart rhythm:        Vascular     Pain in calf, thigh, or hip brought on by ambulation: x   Pain in feet at night that wakes you up from your sleep:     Blood clot in your veins:    Leg swelling:           PHYSICAL EXAM: Vitals:   08/06/19 1536  BP: 125/66  Pulse: 90  Resp: 20  Temp: 97.7 F (36.5 C)  SpO2: 94%  Weight: 92.4 kg  Height: 6' (1.829 m)    GENERAL: The patient is a well-nourished male, in no acute distress. The vital signs are documented above. PULMONARY: There is good air exchange  MUSCULOSKELETAL: There are no major deformities or cyanosis. NEUROLOGIC: No focal weakness or paresthesias are detected. SKIN: There are no ulcers or rashes noted. PSYCHIATRIC: The patient has a normal affect.  DATA:  I reviewed his actual arteriogram films with the patient.  This demonstrates no evidence of aortoiliac occlusive disease.  His right superficial femoral artery is patent to the knee where there is an abrupt occlusion with and with reconstitution of diseased tibial vessels with the anterior tibial being the main runoff vessel.  On the left he has flush occlusion of his superficial femoral artery at its origin with reconstitution of a diseased popliteal artery and again severe tibial disease.  MEDICAL ISSUES: I again explained the critical importance of smoking cessation and walking.  I would not recommend any surgical intervention.  I explained that with his severe tibial disease that any intervention with surgical bypass to the tibial level would actually increase his lifelong risk of amputation versus observation only.  He would be a candidate for distal bypass but this certainly would be reserved for limb salvage.  We will see him again in 6 months for repeat ankle arm indices and continued discussion.  We will also stressed to him to notify should he develop any nonhealing ulcerations on his feet    Rosetta Posner, MD [facility] Tel [phone] Pager [phone]

## 2019-08-06 NOTE — Telephone Encounter (Signed)
Called him back- he is ok with increasing dose of crestor, he is taking it daily I will increase him to 20 mg crestor

## 2019-08-06 NOTE — Addendum Note (Signed)
Addended by: Lamar Blinks C on: 08/06/2019 07:57 PM   Modules accepted: Orders

## 2019-08-09 NOTE — Telephone Encounter (Signed)
Patient No Showed for PV. A message was left on voicemail to call and reschedule his PV before 5:00 Pm today. If patient does not reschedule a no show letter will be mailed and the colonoscopy that was scheduled for 08/23/19 with Dr. Henrene Pastor will be cancelled per [place] guidelines.   Riki Sheer, LPN ( PV )

## 2019-08-14 DIAGNOSIS — Z23 Encounter for immunization: Secondary | ICD-10-CM | POA: Diagnosis not present

## 2019-08-23 ENCOUNTER — Encounter: Payer: Medicare Other | Admitting: Internal Medicine

## 2019-08-28 ENCOUNTER — Other Ambulatory Visit: Payer: Self-pay | Admitting: Family Medicine

## 2019-08-28 DIAGNOSIS — F4321 Adjustment disorder with depressed mood: Secondary | ICD-10-CM

## 2019-08-28 DIAGNOSIS — F411 Generalized anxiety disorder: Secondary | ICD-10-CM

## 2019-08-28 MED ORDER — ALPRAZOLAM 0.25 MG PO TABS: 0.2500 mg | ORAL_TABLET | ORAL | 1 refills | Status: DC

## 2019-08-28 NOTE — Telephone Encounter (Signed)
Requested medication (s) are due for refill today: yes  Requested medication (s) are on the active medication list: yes  Last refill:  07/26/2019  Future visit scheduled: no  Notes to clinic: refill cannot be delegated    Requested Prescriptions  Pending Prescriptions Disp Refills   ALPRAZolam (XANAX) 0.25 MG tablet 45 tablet 1    Sig: Take 1 tablet (0.25 mg total) by mouth See admin instructions. Take 1 tablet (0.25 mg) by mouth scheduled every morning & may repeat dose in the evening as needed for anxiety.     Not Delegated - Psychiatry:  Anxiolytics/Hypnotics Failed - 08/28/2019  8:52 AM      Failed - This refill cannot be delegated      Passed - Urine Drug Screen completed in last 360 days.      Passed - Valid encounter within last 6 months    Recent Outpatient Visits          1 month ago GAD (generalized anxiety disorder)   [facility] at [place], [place]   2 months ago COPD exacerbation [facility])   [facility]ivist at [place], [place]   6 months ago Chronic midline low back pain without sciatica   Archivist at [place], [place]   7 months ago Intermittent claudication ([place])   Archivist at [place], [place]   11 months ago Chronic back pain, unspecified back location, unspecified back pain laterality   Archivist at [place], Gay Filler, MD

## 2019-08-28 NOTE — Telephone Encounter (Signed)
Copied from [place] [id number]. Topic: Quick Communication - Rx Refill/Question >> Aug 28, 2019  8:47 AM Rainey Pines A wrote: Medication:ALPRAZolam Duanne Moron) 0.25 MG tablet   Has the patient contacted their pharmacy? {Yes (Agent: If no, request that the patient contact the pharmacy for the refill.) (Agent: If yes, when and what did the pharmacy advise?)Contact PCP  Preferred Pharmacy (with phone number or street name): [id number] - [place], [place]. [phone] (Phone) [phone] (Fax)    Agent: Please be advised that RX refills may take up to 3 business days. We ask that you follow-up with your pharmacy.

## 2019-08-31 NOTE — Progress Notes (Signed)
[facility] [address], [address] [place], [place] [address] [phone] [phone]  Date:  09/02/2019   Name:  Jonathan GainFarrell D Thayne   DOB:  05/28/1956   MRN:  [id number]  PCP:  Pearline Cablesopland, Nejla Reasor C, MD    Chief Complaint: No chief complaint on file.   History of Present Illness:  Jonathan Harrington is a 63 y.o. very pleasant male patient who presents with the following:  Gentleman with history of hypertension, chronic pain, anxiety, neuropathy, alcohol abuse, COPD Here today for follow-up visit  We last visited in August He has significant bilateral claudication and is being followed by vascular surgery At her last visit I increased his dose of Crestor to 20 mg due to persistent dyslipidemia-tolerating okay so far  He notes that his cough is getting worse -he thinks likely COPD  He is still smoking, he really would like to quit.  He notes this will help his lungs and also his claudication The cough is generally dry  He is using an inhaler that he got from the hospital- albuterol He has not tried Spiriva yet, no history of clot,  He got his flu shot last week  Due for a colon cancer screening - he would like to do cologuard  Order for him today  Reviewed his controlled substance database.  He is taking alprazolam, and hydrocodone for chronic leg pain  08/28/2019  1   08/28/2019  Alprazolam 0.25 MG Tablet  45.00  22 Je Cop   [id number]   [place] (2372)   0  1.02 LME  Medicare   [place]  08/08/2019  1   07/24/2019  Hydrocodone-Acetamin 7.5-325  70.00  23 Je Cop   [id number]   [place] (2372)   0  22.83 MME  Private Pay   [place]  07/24/2019  1   07/24/2019  Alprazolam 0.25 MG Tablet  45.00  30 Je Cop   [id number]   [place] (2372)   0  0.75 LME  Medicare   [place]  07/15/2019  1   07/15/2019  Hydrocodone-Acetamin 7.5-325  70.00  24 Je Cop   [id number]   [place] (2372)   0  21.88 MME  Private Pay   [place]  06/21/2019  1   06/21/2019  Alprazolam 0.25 MG Tablet  45.00  23 Je Cop   [id number]   [place]  (2372)   0  0.98 LME  Medicare   [place]  06/03/2019  1   06/03/2019  Hydrocodone-Acetamin 7.5-325  70.00  30 Je Cop   [id number]   [place] (2372)   0  17.50 MME  Private Pay   [place]  05/07/2019  1   04/09/2019  Alprazolam 0.25 MG Tablet  45.00  30 Je Cop   [id number]   [place] (2372)   1  0.75 LME  Medicare   [place]  04/29/2019  1   04/29/2019  Hydrocodone-Acetamin 7.5-325  70.00  24 Je Cop   [id number]   [place] (2372)   0  21.88 MME  Private Pay   [place]  04/09/2019  1   04/09/2019  Alprazolam 0.25 MG Tablet  45.00  30 Je Cop   [id number]   [place] (2372)   0  0.75 LME        Patient Active Problem List   Diagnosis Date Noted  . Neuropathy 06/04/2019  . Alcoholism (HCC) 11/19/2018  . Essential hypertension 06/06/2016  . Smoker 05/12/2016  . Left  inguinal hernia 05/23/2014  . Anxiety state, unspecified 09/17/2013  . Gout 01/17/2013  . DIVERTICULOSIS-COLON 08/13/2010  . DIVERTICULITIS, COLON 08/13/2010  . ABDOMINAL PAIN-LLQ 08/13/2010  . Nonspecific (abnormal) findings on radiological and other examination of body structure 08/13/2010  . PERSONAL HX COLONIC POLYPS 08/13/2010  . NONSPCIFC ABN FINDING RAD & OTH EXAM LUNG FIELD 08/13/2010    Past Medical History:  Diagnosis Date  . Alcoholism (HCC)   . Anxiety   . COPD (chronic obstructive pulmonary disease) (HCC)   . Diverticulitis 2011   resulted in partial colectomy  . Essential hypertension 06/06/2016  . Full dentures   . Gout   . Hematuria   . Wears glasses     Past Surgical History:  Procedure Laterality Date  . ABDOMINAL AORTOGRAM W/LOWER EXTREMITY Bilateral 07/19/2019   Procedure: ABDOMINAL AORTOGRAM W/LOWER EXTREMITY;  Surgeon: Sherren Kerns, MD;  [facility];  Service: Cardiovascular;  Laterality: Bilateral;  . COLON SURGERY  2011   partial colectomy  . COLONOSCOPY    . FINGER ARTHROPLASTY  1990   rt index fx  . HERNIA REPAIR    . INGUINAL HERNIA REPAIR Left 05/27/2014   Procedure: LEFT INGUINAL HERNIA REPAIR WITH MESH;  Surgeon:  Wilmon Arms. Corliss Skains, MD;  Location: [facility];  Service: General;  Laterality: Left;  . INSERTION OF MESH Left 05/27/2014   Procedure: INSERTION OF MESH;  Surgeon: Wilmon Arms. Corliss Skains, MD;  Location: [facility];  Service: General;  Laterality: Left;    Social History   Tobacco Use  . Smoking status: Current Every Day Smoker    Packs/day: 2.00  . Smokeless tobacco: Never Used  Substance Use Topics  . Alcohol use: Yes    Alcohol/week: 21.0 standard drinks    Types: 21 Cans of beer per week    Comment: about 3 beers daily- history of heavy liquor abuse  . Drug use: No    Family History  Problem Relation Age of Onset  . Alzheimer's disease Mother   . Cancer Sister   . Neuropathy Neg Hx     Allergies  Allergen Reactions  . Ibuprofen Swelling    Medication list has been reviewed and updated.  Current Outpatient Medications on File Prior to Visit  Medication Sig Dispense Refill  . albuterol (VENTOLIN HFA) 108 (90 Base) MCG/ACT inhaler Inhale 2 puffs into the lungs every 6 (six) hours as needed for wheezing or shortness of breath. 1 Inhaler 6  . allopurinol (ZYLOPRIM) 300 MG tablet TAKE 1 TABLET BY MOUTH EVERY DAY (Patient taking differently: Take 300 mg by mouth daily. ) 90 tablet 1  . ALPRAZolam (XANAX) 0.25 MG tablet Take 1 tablet (0.25 mg total) by mouth See admin instructions. Take 1 tablet (0.25 mg) by mouth scheduled every morning & may repeat dose in the evening as needed for anxiety. 45 tablet 1  . amLODipine (NORVASC) 10 MG tablet TAKE 1 TABLET BY MOUTH EVERY DAY 90 tablet 0  . cyanocobalamin 2000 MCG tablet Take 2,000 mcg by mouth daily.    . diphenhydrAMINE HCl, Sleep, (SLEEP-AID) 50 MG CAPS Take 50 mg by mouth at bedtime.    . gabapentin (NEURONTIN) 300 MG capsule TAKE 2 CAPSULES BY MOUTH 3 TIMES A DAY (Patient taking differently: Take 600 mg by mouth 3 (three) times daily. ) 540 capsule 1  . HYDROcodone-acetaminophen (NORCO) 7.5-325 MG tablet  Take 1 tablet by mouth every 8 (eight) hours as needed for moderate pain. 70  tablet 0  . lisinopril (ZESTRIL) 40 MG tablet Take 1 tablet (40 mg total) by mouth daily. 90 tablet 3  . Multiple Vitamins-Minerals (MULTIVITAMIN WITH MINERALS) tablet Take 1 tablet by mouth daily. Reported on 03/21/2016    . rosuvastatin (CRESTOR) 20 MG tablet Take 1 tablet (20 mg total) by mouth daily. 90 tablet 3  . tamsulosin (FLOMAX) 0.4 MG CAPS capsule Take 1 capsule (0.4 mg total) by mouth daily after breakfast. 30 capsule 0  . triamcinolone cream (KENALOG) 0.1 % Apply 1 application topically 2 (two) times daily. Use as needed for eczema (Patient taking differently: Apply 1 application topically 2 (two) times daily as needed (eczema on leg). ) 90 g 2   No current facility-administered medications on file prior to visit.     Review of Systems:  As per HPI- otherwise negative. No fever or chills  Physical Examination: There were no vitals filed for this visit. There were no vitals filed for this visit. There is no height or weight on file to calculate BMI. Ideal Body Weight:    Spoke to pt on the phone for 11 minutes  He did not have Wi-Fi, could not get video connected He sounds well, no cough, shortness of breath, wheezing is appreciated  Assessment and Plan: Chronic obstructive pulmonary disease, unspecified COPD type ([place]) - Plan: tiotropium (SPIRIVA) 18 MCG inhalation capsule  Dyslipidemia  GAD (generalized anxiety disorder)  Intermittent claudication ([place])  Pulmonary emphysema, unspecified emphysema type ([place])  Colon cancer screening  Following up today for virtual visit Draydon has noticed increase in his chronic cough.  He is using albuterol as needed.  We will try Spiriva to see if this gives him any relief.  Advised him to try this for 1 or 2 months, let me know if helpful Recent CT of the chest did not show any worrisome nodules Flu shot is done Claudication is managed by vascular  surgery Encouraged him to quit smoking, he is working on it [employer]  Signed Lamar Blinks, MD

## 2019-09-02 ENCOUNTER — Encounter: Payer: Self-pay | Admitting: Family Medicine

## 2019-09-02 ENCOUNTER — Other Ambulatory Visit: Payer: Self-pay

## 2019-09-02 ENCOUNTER — Ambulatory Visit (INDEPENDENT_AMBULATORY_CARE_PROVIDER_SITE_OTHER): Payer: Medicare Other | Admitting: Family Medicine

## 2019-09-02 DIAGNOSIS — J439 Emphysema, unspecified: Secondary | ICD-10-CM

## 2019-09-02 DIAGNOSIS — I70213 Atherosclerosis of native arteries of extremities with intermittent claudication, bilateral legs: Secondary | ICD-10-CM | POA: Diagnosis not present

## 2019-09-02 DIAGNOSIS — F411 Generalized anxiety disorder: Secondary | ICD-10-CM | POA: Diagnosis not present

## 2019-09-02 DIAGNOSIS — J449 Chronic obstructive pulmonary disease, unspecified: Secondary | ICD-10-CM | POA: Diagnosis not present

## 2019-09-02 DIAGNOSIS — Z1211 Encounter for screening for malignant neoplasm of colon: Secondary | ICD-10-CM

## 2019-09-02 DIAGNOSIS — E785 Hyperlipidemia, unspecified: Secondary | ICD-10-CM | POA: Diagnosis not present

## 2019-09-02 DIAGNOSIS — I739 Peripheral vascular disease, unspecified: Secondary | ICD-10-CM | POA: Diagnosis not present

## 2019-09-02 MED ORDER — TIOTROPIUM BROMIDE MONOHYDRATE 18 MCG IN CAPS: 18.0000 ug | ORAL_CAPSULE | Freq: Every day | RESPIRATORY_TRACT | 12 refills | Status: DC

## 2019-09-03 ENCOUNTER — Telehealth: Payer: Self-pay | Admitting: *Deleted

## 2019-09-03 DIAGNOSIS — J449 Chronic obstructive pulmonary disease, unspecified: Secondary | ICD-10-CM

## 2019-09-03 NOTE — Telephone Encounter (Signed)
Spiriva is not covered on patients ins. Tried PA anyway.  Denied. This drug is not covered on the formulary. We are denying your request because we do not show that you have tried at least 2 covered drugs that can treat your condition. Other covered drug(s) is/are: Anoro Ellipta, Breo Ellipta, and Incruse Ellipta. We may be able to make an exception to cover this drug. Your doctor will need to send [place] medical records showing that you tried this drug. If you cannot take the covered drug, your doctor will need to tell us why. Note: Some covered drug(s) may have quantity limits. Please refer to the formulary for details  Spoke with patient and he has not tried any of these alternatives.  Patient would like to to be called with which new medication you send in.

## 2019-09-04 ENCOUNTER — Other Ambulatory Visit: Payer: Self-pay | Admitting: Family Medicine

## 2019-09-04 DIAGNOSIS — I10 Essential (primary) hypertension: Secondary | ICD-10-CM

## 2019-09-04 MED ORDER — BREO ELLIPTA 100-25 MCG/INH IN AEPB: 1.0000 | INHALATION_SPRAY | Freq: Every day | RESPIRATORY_TRACT | 6 refills | Status: DC

## 2019-09-18 ENCOUNTER — Encounter: Payer: Self-pay | Admitting: Family Medicine

## 2019-09-18 DIAGNOSIS — Z1212 Encounter for screening for malignant neoplasm of rectum: Secondary | ICD-10-CM | POA: Diagnosis not present

## 2019-09-18 DIAGNOSIS — Z1211 Encounter for screening for malignant neoplasm of colon: Secondary | ICD-10-CM | POA: Diagnosis not present

## 2019-09-18 LAB — COLOGUARD

## 2019-09-19 ENCOUNTER — Telehealth: Payer: Self-pay | Admitting: Family Medicine

## 2019-09-19 DIAGNOSIS — M545 Low back pain, unspecified: Secondary | ICD-10-CM

## 2019-09-19 DIAGNOSIS — G8929 Other chronic pain: Secondary | ICD-10-CM

## 2019-09-19 NOTE — Telephone Encounter (Signed)
HYDROcodone-acetaminophen (NORCO) 7.5-325 MG tablet   Pt want to know if you can up his dosage to 10mg  due to his arthritis., please advise  Send to [place]

## 2019-09-20 MED ORDER — HYDROCODONE-ACETAMINOPHEN 7.5-325 MG PO TABS: 1.0000 | ORAL_TABLET | Freq: Three times a day (TID) | ORAL | 0 refills | Status: DC | PRN

## 2019-09-20 NOTE — Telephone Encounter (Signed)
Please give Jonathan Harrington a call,  I refilled his medication but did not increase the dose.  I am sorry but I don't think increasing his dose is a safe idea for him  JC

## 2019-09-20 NOTE — Telephone Encounter (Signed)
Patient made aware verbalized understanding

## 2019-09-28 ENCOUNTER — Other Ambulatory Visit: Payer: Self-pay | Admitting: Family Medicine

## 2019-09-28 DIAGNOSIS — L989 Disorder of the skin and subcutaneous tissue, unspecified: Secondary | ICD-10-CM

## 2019-09-30 ENCOUNTER — Encounter: Payer: Self-pay | Admitting: Family Medicine

## 2019-10-21 ENCOUNTER — Other Ambulatory Visit: Payer: Self-pay | Admitting: Family Medicine

## 2019-10-21 DIAGNOSIS — G8929 Other chronic pain: Secondary | ICD-10-CM

## 2019-10-21 MED ORDER — HYDROCODONE-ACETAMINOPHEN 7.5-325 MG PO TABS: 1.0000 | ORAL_TABLET | Freq: Three times a day (TID) | ORAL | 0 refills | Status: DC | PRN

## 2019-10-21 NOTE — Telephone Encounter (Signed)
Medication Refill - Medication: HYDROcodone-acetaminophen (NORCO) 7.5-325 MG tablet  ° ° °Has the patient contacted their pharmacy? No. °(Agent: If no, request that the patient contact the pharmacy for the refill.) °(Agent: If yes, when and what did the pharmacy advise?) ° °Preferred Pharmacy (with phone number or street name):  °[id number] [place] [place] - [address] [phone] (Phone) °[phone] (Fax)  ° ° ° °Agent: Please be advised that RX refills may take up to 3 business days. We ask that you follow-up with your pharmacy. °

## 2019-10-21 NOTE — Telephone Encounter (Signed)
Requested medication (s) are due for refill today: yes  Requested medication (s) are on the active medication list: yes  Last refill:  09/20/2019  Future visit scheduled:no  Notes to clinic:  Refill cannot be delegated    Requested Prescriptions  Pending Prescriptions Disp Refills   HYDROcodone-acetaminophen (NORCO) 7.5-325 MG tablet 70 tablet 0    Sig: Take 1 tablet by mouth every 8 (eight) hours as needed for moderate pain.     Not Delegated - Analgesics:  Opioid Agonist Combinations Failed - 10/21/2019  9:49 AM      Failed - This refill cannot be delegated      Failed - Urine Drug Screen completed in last 360 days.      Passed - Valid encounter within last 6 months    Recent Outpatient Visits          1 month ago Chronic obstructive pulmonary disease, unspecified COPD type ([place])   [facility]ivist at [facility], Gay Filler, MD   2 months ago GAD (generalized anxiety disorder)   Archivist at [place], MD   3 months ago COPD exacerbation [facility])   Archivist at [place], [place]   8 months ago Chronic midline low back pain without sciatica   Archivist at [place], MD   9 months ago Intermittent claudication [facility])   Archivist at [place], Gay Filler, MD

## 2019-11-20 ENCOUNTER — Telehealth: Payer: Self-pay | Admitting: Family Medicine

## 2019-11-20 DIAGNOSIS — G8929 Other chronic pain: Secondary | ICD-10-CM

## 2019-11-20 MED ORDER — HYDROCODONE-ACETAMINOPHEN 7.5-325 MG PO TABS: 1.0000 | ORAL_TABLET | Freq: Three times a day (TID) | ORAL | 0 refills | Status: DC | PRN

## 2019-11-20 NOTE — Telephone Encounter (Signed)
rx refill HYDROcodone-acetaminophen (NORCO) 7.5-325 MG tablet  ALPRAZolam (XANAX) 0.25 MG tablet  PHARMACY [id number] [place] [place]. [phone] (Phone) [phone] (Fax)

## 2019-11-22 ENCOUNTER — Other Ambulatory Visit: Payer: Self-pay | Admitting: Family Medicine

## 2019-11-22 DIAGNOSIS — F4321 Adjustment disorder with depressed mood: Secondary | ICD-10-CM

## 2019-11-22 DIAGNOSIS — F411 Generalized anxiety disorder: Secondary | ICD-10-CM

## 2019-11-22 NOTE — Telephone Encounter (Signed)
Requesting:xanax  Contract:no  UDS:n/a Last OV:09/02/19 Next OV:n/a Last Refill:08/28/19  #[id number] Database:   Please advise

## 2019-12-11 ENCOUNTER — Telehealth: Payer: Self-pay | Admitting: Family Medicine

## 2019-12-11 NOTE — Telephone Encounter (Signed)
Scheduled virtual visit with Dr. Lorelei Pont to discuss ED

## 2019-12-11 NOTE — Telephone Encounter (Signed)
Patient states he is having issues with ED.  Patient has already taken viagra.  Patient states it does not do anything.  Patient would like to get a different medication. Call back [phone]

## 2019-12-12 ENCOUNTER — Other Ambulatory Visit: Payer: Self-pay

## 2019-12-14 NOTE — Progress Notes (Signed)
[place] at [facility] [address] [place], [place] [address] [phone]  Date:  12/16/2019   Name:  Jonathan Harrington   DOB:  Jul 29, 1956   MRN:  [id number]  PCP:  Darreld Mclean, MD    Chief Complaint: No chief complaint on file.   History of Present Illness:  Jonathan Harrington is a 64 y.o. 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  He has a new GF which is good news-however he having trouble with erectile dysfunction.   He would like some medication for ED- he recently borrowed viagra from a friend of his but it did not seem to work No SE or chest pain from the Viagra, I just did not give him any response  He would be willing to come in for a lab visit soon as well  11/22/2019  2   11/22/2019  Alprazolam 0.25 MG Tablet  45.00  30 Je Cop   [id number]   [place] (2372)   0  0.75 LME  Medicare   [place]  11/20/2019  2   11/20/2019  Hydrocodone-Acetamin 7.5-325  70.00  23 Je Cop   [id number]   [place] (2372)   0  22.83 MME  Private Pay   [place]  10/21/2019  2   10/21/2019  Hydrocodone-Acetamin 7.5-325  70.00  23 Je Cop   [id number]   [place] (2372)   0  22.83 MME  Medicare   [place]  09/28/2019  2   08/28/2019  Alprazolam 0.25 MG Tablet  45.00  22 Je Cop   [id number]   [place] (2372)   1  1.02 LME  Medicare   [place]  09/20/2019  2   09/20/2019  Hydrocodone-Acetamin 7.5-325   70.00  23 Je Cop   [id number]   [place] (2372)   0  22.83 MME  Medicare   [place]  08/28/2019  2   08/28/2019  Alprazolam 0.25 MG Tablet  45.00  22 Je Cop   [id number]   [place] (2372)   0  1.02 LME  Medicare   [place]  08/08/2019  2   07/24/2019  Hydrocodone-Acetamin 7.5-325  70.00  23 Je Cop   [id number]   [place] (2372)   0  22.83 MME  Private Pay   [place]  07/24/2019  2   07/24/2019  Alprazolam 0.25 MG Tablet  45.00  30 Je Cop   [id number]   [place] (2372)   0  0.75 LME  Medicare   [place]  07/15/2019  2   07/15/2019  Hydrocodone-Acetamin 7.5-325 2 70.00  24 Je Cop   [id number]   [place] (2372)   0  21.88 MME  Private Pay     06/21/2019  2   06/21/2019  Alprazolam 0.25 MG Tablet  45.00  23 Je Cop  [id number]   [place] (2372)   0  0.98 LME  Medicare   [place]  06/03/2019  2   06/03/2019  Hydrocodone-Acetamin 7.5-325  70.00  30 Je Cop   [id number]   [place] (2372)   0  17.50 MME  Private Pay   [place]  05/07/2019  2   04/09/2019  Alprazolam 0.25 MG Tablet  45.00  30 Je Cop   [id number]   [place] (2372)   1  0.75 LME  Medicare   [place]  04/29/2019  2   04/29/2019  Hydrocodone-Acetamin 7.5-325  70.00  24 Je Cop   [id number]   [place] (2372)   0  21.88 MME  Private Pay   [place]  04/09/2019  2   04/09/2019  Alprazolam 0.25 MG Tablet  45.00  30 Je Cop   [id number]   Nor (2372)   0  0.75 LME        Patient Active Problem List   Diagnosis Date Noted  . Neuropathy 06/04/2019  . Alcoholism (HCC) 11/19/2018  . Essential hypertension 06/06/2016  . Smoker 05/12/2016  . Left inguinal hernia 05/23/2014  . Anxiety state, unspecified 09/17/2013  . Gout 01/17/2013  . DIVERTICULOSIS-COLON 08/13/2010  . DIVERTICULITIS, COLON 08/13/2010  . ABDOMINAL PAIN-LLQ 08/13/2010  . Nonspecific (abnormal) findings on radiological and other examination of body structure 08/13/2010  . PERSONAL HX COLONIC POLYPS 08/13/2010  . NONSPCIFC ABN FINDING RAD & OTH EXAM LUNG FIELD 08/13/2010    Past Medical History:  Diagnosis Date  . Alcoholism (HCC)   . Anxiety   . COPD (chronic obstructive pulmonary  disease) (HCC)   . Diverticulitis 2011   resulted in partial colectomy  . Essential hypertension 06/06/2016  . Full dentures   . Gout   . Hematuria   . Wears glasses     Past Surgical History:  Procedure Laterality Date  . ABDOMINAL AORTOGRAM W/LOWER EXTREMITY Bilateral 07/19/2019   Procedure: ABDOMINAL AORTOGRAM W/LOWER EXTREMITY;  Surgeon: Sherren Kerns, MD;  Location: [facility] INVASIVE CV LAB;  Service: Cardiovascular;  Laterality: Bilateral;  . COLON SURGERY  2011   partial colectomy  . COLONOSCOPY    . FINGER ARTHROPLASTY  1990   rt index fx  . HERNIA REPAIR    . INGUINAL HERNIA REPAIR Left 05/27/2014   Procedure: LEFT INGUINAL HERNIA REPAIR WITH MESH;  Surgeon: Wilmon Arms. Corliss Skains, MD;  Location: [facility];  Service: General;  Laterality: Left;  . INSERTION OF MESH Left 05/27/2014   Procedure: INSERTION OF MESH;  Surgeon: Wilmon Arms. Corliss Skains, MD;  Location: [facility];  Service: General;  Laterality: Left;    Social History   Tobacco Use  . Smoking status: Current Every Day Smoker    Packs/day: 2.00  . Smokeless tobacco: Never Used  Substance Use Topics  . Alcohol use: Yes    Alcohol/week: 21.0 standard drinks    Types: 21 Cans of beer per week    Comment: about 3 beers daily- history of heavy liquor abuse  . Drug use: No    Family History  Problem Relation Age of Onset  . Alzheimer's disease Mother   . Cancer Sister   . Neuropathy Neg Hx     Allergies  Allergen Reactions  . Ibuprofen Swelling    Medication list has been reviewed and updated.  Current Outpatient Medications on File Prior to Visit  Medication Sig Dispense Refill  . albuterol (VENTOLIN HFA) 108 (90 Base) MCG/ACT inhaler Inhale 2 puffs into  the lungs every 6 (six) hours as needed for wheezing or shortness of breath. 1 Inhaler 6  . allopurinol (ZYLOPRIM) 300 MG tablet TAKE 1 TABLET BY MOUTH EVERY DAY (Patient taking differently: Take 300 mg by mouth daily. ) 90 tablet 1  .  ALPRAZolam (XANAX) 0.25 MG tablet TAKE 1 TABLET BY MOUTH SCHEDULED EVERY MORNING & MAY REPEAT IN THE EVENING AS NEEDED FOR ANXIETY. 45 tablet 1  . amLODipine (NORVASC) 10 MG tablet TAKE 1 TABLET BY MOUTH EVERY DAY 90 tablet 0  . cyanocobalamin 2000 MCG tablet Take 2,000 mcg by mouth daily.    . diphenhydrAMINE HCl, Sleep, (SLEEP-AID) 50 MG CAPS Take 50 mg by mouth at bedtime.    . fluticasone furoate-vilanterol (BREO ELLIPTA) 100-25 MCG/INH AEPB Inhale 1 puff into the lungs daily. 28 each 6  . gabapentin (NEURONTIN) 300 MG capsule TAKE 2 CAPSULES BY MOUTH 3 TIMES A DAY (Patient taking differently: Take 600 mg by mouth 3 (three) times daily. ) 540 capsule 1  . HYDROcodone-acetaminophen (NORCO) 7.5-325 MG tablet Take 1 tablet by mouth every 8 (eight) hours as needed for moderate pain. 70 tablet 0  . lisinopril (ZESTRIL) 40 MG tablet Take 1 tablet (40 mg total) by mouth daily. 90 tablet 3  . Multiple Vitamins-Minerals (MULTIVITAMIN WITH MINERALS) tablet Take 1 tablet by mouth daily. Reported on 03/21/2016    . rosuvastatin (CRESTOR) 20 MG tablet Take 1 tablet (20 mg total) by mouth daily. 90 tablet 3  . tamsulosin (FLOMAX) 0.4 MG CAPS capsule Take 1 capsule (0.4 mg total) by mouth daily after breakfast. 30 capsule 0  . triamcinolone cream (KENALOG) 0.1 % Apply 1 application topically 2 (two) times daily as needed (eczema on leg). 90 g 2   No current facility-administered medications on file prior to visit.    Review of Systems:  As per HPI- otherwise negative.   Physical Examination: There were no vitals filed for this visit. There were no vitals filed for this visit. There is no height or weight on file to calculate BMI. Ideal Body Weight:    Spoke with patient on the telephone.  He was not able to get video to connect.  He sounds well, no cough, wheezing, somnolence or distress  Assessment and Plan: GAD (generalized anxiety disorder)  Chronic bilateral low back pain without  sciatica  Dyslipidemia - Plan: Lipid panel  Essential hypertension - Plan: CBC, Comprehensive metabolic panel  Medication monitoring encounter - Plan: UDS  Screening for diabetes mellitus  Erectile dysfunction, unspecified erectile dysfunction type - Plan: tadalafil (CIALIS) 20 MG tablet  Pre-diabetes - Plan: Hemoglobin A1c  Erectile dysfunction, likely due to vascular insufficiency.  Prescription for Cialis sent in, he will give this a try.  He is already tried what sounds like Viagra and it did not work.  If Cialis also does not help, plan to refer to urology for other strategies  Ordered routine labs, I will ask my nurse to contact him for a lab visit  Spoke with pt for about 10 minutes today This visit occurred during the SARS-CoV-2 public health emergency.  Safety protocols were in place, including screening questions prior to the visit, additional usage of staff PPE, and extensive cleaning of exam room while observing appropriate contact time as indicated for disinfecting solutions.    Signed Abbe Amsterdam, MD

## 2019-12-14 NOTE — Patient Instructions (Signed)
Great to see you again today!  I will be in touch with your labs asap

## 2019-12-16 ENCOUNTER — Encounter: Payer: Self-pay | Admitting: Family Medicine

## 2019-12-16 ENCOUNTER — Other Ambulatory Visit: Payer: Self-pay

## 2019-12-16 ENCOUNTER — Ambulatory Visit (INDEPENDENT_AMBULATORY_CARE_PROVIDER_SITE_OTHER): Payer: Medicare Other | Admitting: Family Medicine

## 2019-12-16 DIAGNOSIS — I10 Essential (primary) hypertension: Secondary | ICD-10-CM

## 2019-12-16 DIAGNOSIS — E785 Hyperlipidemia, unspecified: Secondary | ICD-10-CM | POA: Diagnosis not present

## 2019-12-16 DIAGNOSIS — Z131 Encounter for screening for diabetes mellitus: Secondary | ICD-10-CM

## 2019-12-16 DIAGNOSIS — M545 Low back pain: Secondary | ICD-10-CM

## 2019-12-16 DIAGNOSIS — G8929 Other chronic pain: Secondary | ICD-10-CM

## 2019-12-16 DIAGNOSIS — F411 Generalized anxiety disorder: Secondary | ICD-10-CM

## 2019-12-16 DIAGNOSIS — N529 Male erectile dysfunction, unspecified: Secondary | ICD-10-CM

## 2019-12-16 DIAGNOSIS — Z5181 Encounter for therapeutic drug level monitoring: Secondary | ICD-10-CM

## 2019-12-16 DIAGNOSIS — R7303 Prediabetes: Secondary | ICD-10-CM

## 2019-12-16 MED ORDER — TADALAFIL 20 MG PO TABS: 10.0000 mg | ORAL_TABLET | ORAL | 11 refills | Status: DC | PRN

## 2019-12-18 ENCOUNTER — Other Ambulatory Visit: Payer: Self-pay | Admitting: Family Medicine

## 2019-12-18 DIAGNOSIS — M1000 Idiopathic gout, unspecified site: Secondary | ICD-10-CM

## 2019-12-20 ENCOUNTER — Ambulatory Visit: Payer: Medicare Other | Attending: Internal Medicine

## 2019-12-20 DIAGNOSIS — Z20822 Contact with and (suspected) exposure to covid-19: Secondary | ICD-10-CM | POA: Diagnosis not present

## 2019-12-22 ENCOUNTER — Other Ambulatory Visit: Payer: Self-pay | Admitting: Family Medicine

## 2019-12-22 DIAGNOSIS — I10 Essential (primary) hypertension: Secondary | ICD-10-CM

## 2019-12-22 LAB — NOVEL CORONAVIRUS, NAA: SARS-CoV-2, NAA: NOT DETECTED

## 2019-12-24 ENCOUNTER — Other Ambulatory Visit: Payer: Self-pay | Admitting: Family Medicine

## 2019-12-24 DIAGNOSIS — G8929 Other chronic pain: Secondary | ICD-10-CM

## 2019-12-24 DIAGNOSIS — M545 Low back pain, unspecified: Secondary | ICD-10-CM

## 2019-12-24 NOTE — Telephone Encounter (Signed)
Requested medication (s) are due for refill today: yes  Requested medication (s) are on the active medication list: yes  Last refill:  11/20/2019  Future visit scheduled:no  Notes to clinic:  his refill cannot be delegated    Requested Prescriptions  Pending Prescriptions Disp Refills   HYDROcodone-acetaminophen (NORCO) 7.5-325 MG tablet 70 tablet 0    Sig: Take 1 tablet by mouth every 8 (eight) hours as needed for moderate pain.      Not Delegated - Analgesics:  Opioid Agonist Combinations Failed - 12/24/2019  9:35 AM      Failed - This refill cannot be delegated      Failed - Urine Drug Screen completed in last 360 days.      Passed - Valid encounter within last 6 months    Recent Outpatient Visits           1 week ago GAD (generalized anxiety disorder)   Holiday representative at [employer], [place] C, MD   3 months ago Chronic obstructive pulmonary disease, unspecified COPD type (HCC)   Holiday representative at [employer], Gwenlyn Found, MD   5 months ago GAD (generalized anxiety disorder)   Holiday representative at [employer], Gwenlyn Found, MD   5 months ago COPD exacerbation [facility])   Holiday representative at [employer], [place], [place]   10 months ago Chronic midline low back pain without sciatica   Holiday representative at [employer], Gwenlyn Found, MD

## 2019-12-24 NOTE — Telephone Encounter (Signed)
Medication refill: HYDROcodone-acetaminophen (NORCO) 7.5-325 MG tablet [[id number]]    Pharmacy:  [id number] , [place] - [address] Phone:  [phone]  Fax:  [phone]      Pt aware of turn around time

## 2019-12-25 MED ORDER — HYDROCODONE-ACETAMINOPHEN 7.5-325 MG PO TABS: 1.0000 | ORAL_TABLET | Freq: Three times a day (TID) | ORAL | 0 refills | Status: DC | PRN

## 2019-12-25 NOTE — Telephone Encounter (Signed)
Requesting:   hydrocodone Contract:    none UDS:   UDS///09/13/2018 Last Visit:    12/16/2019 Next Visit:    none Last Refill:   #70 no refills on 11/20/2019  Please Advise

## 2020-01-06 ENCOUNTER — Other Ambulatory Visit: Payer: Self-pay

## 2020-01-06 NOTE — Progress Notes (Addendum)
[facility] [address], [place], [place] [address] [phone]  Date:  01/08/2020   Name:  Jonathan Harrington   DOB:  September 06, 1956   MRN:  [id number]  PCP:  Darreld Mclean, MD    Chief Complaint: Cold Sensation (feeling cold for the past several weeks, no aches, no fever)   History of Present Illness:  Jonathan Harrington is a 64 y.o. very pleasant male patient who presents with the following:  Patient with history of anxiety, diverticulitis, neuropathy, hypertension, chronic joint pain  Here today with concern of feeling cold-he is not sure why this might be He notes that he is getting cold easily over the last month or so- he is cold when others are comfortable.  If the temperature is for example 72 degrees inside, he may still feel chilly  He works at a company that [employer]  Allopurinol Alprazolam Amlodipine Gabapentin Lisinopril Crestor Brio Ellipta Hydrocodone  Jonathan Harrington lost his wife a few years ago, he recently started dating a new woman.  This relationship is ongoing I gave him prescription for Cialis recently, he filled it but has not yet tried the pills.  He notes that they are expensive  He has not noted anything else of concern-no other particular symptoms of note No fever or cough No urinary sx Wt Readings from Last 3 Encounters:  01/08/20 202 lb (91.6 kg)  08/06/19 203 lb 12.8 oz (92.4 kg)  07/24/19 200 lb (90.7 kg)    12/25/2019  2   12/25/2019  Hydrocodone-Acetamin 7.5-325  70.00  24 Je Cop   [id number]   Nor (2372)   0  21.88 MME  Medicare   [place]  12/24/2019  2   11/22/2019  Alprazolam 0.25 MG Tablet  45.00  30 Je Cop   [id number]   Nor (2372)   1  0.75 LME  Medicare   [place]  11/22/2019  2   11/22/2019  Alprazolam 0.25 MG Tablet  45.00  30 Je Cop   [id number]   [place] (2372)   0  0.75 LME  Medicare   [place]  11/20/2019  2   11/20/2019  Hydrocodone-Acetamin 7.5-325  70.00  23 Je Cop   [id number]   [place]  (2372)   0  22.83 MME  Private Pay   [place]  10/21/2019  2   10/21/2019  Hydrocodone-Acetamin 7.5-325  70.00  23 Je Cop   [id number]   [place] (2372)   0  22.83 MME  Medicare   [place]  09/28/2019  2   08/28/2019  Alprazolam 0.25 MG Tablet  45.00  22 Je Cop   [id number]   [place] (2372)   1  1.02 LME  Medicare   [place]  09/20/2019  2   09/20/2019  Hydrocodone-Acetamin 7.5-325  70.00  23 Je Cop   [id number]   [place] (2372)   0  22.83 MME  Medicare   [place]  08/28/2019  2   08/28/2019  Alprazolam 0.25 MG Tablet  45.00  22 Je Cop   [id number]   [place] (2372)   0  1.02 LME  Medicare     08/08/2019  2   07/24/2019  Hydrocodone-Acetamin 7.5-325  70.00  23 Je Cop   [id number]   [place] (2372)   0  22.83 MME  Private Pay     07/24/2019  2   07/24/2019  Alprazolam 0.25 MG Tablet  45.00  30  Je Cop   [id number]   [place] (2372)   0  0.75 LME  Medicare   [place]  07/15/2019  2   07/15/2019  Hydrocodone-Acetamin 7.5-325  70.00  24 Je Cop   [id number]   [place] (2372)   0  21.88 MME  Private Pay   [place]  06/21/2019  2   06/21/2019  Alprazolam 0.25 MG Tablet  45.00  23 Je Cop   [id number]   [place] ([id number])   0  0.98 LME      UDS due today   Patient Active Problem List   Diagnosis Date Noted  . Neuropathy 06/04/2019  . Alcoholism (HCC) 11/19/2018  . Essential hypertension 06/06/2016  . Smoker 05/12/2016  . Left inguinal hernia 05/23/2014  . Anxiety state, unspecified 09/17/2013  . Gout 01/17/2013  . DIVERTICULOSIS-COLON 08/13/2010  . DIVERTICULITIS, COLON 08/13/2010  . ABDOMINAL PAIN-LLQ 08/13/2010  . Nonspecific (abnormal) findings on radiological and other examination of body structure 08/13/2010  . PERSONAL HX COLONIC POLYPS 08/13/2010  . NONSPCIFC ABN FINDING RAD & OTH EXAM LUNG FIELD 08/13/2010    Past Medical History:  Diagnosis Date  . Alcoholism (HCC)   . Anxiety   . COPD (chronic obstructive pulmonary disease) (HCC)   . Diverticulitis 2011   resulted in partial colectomy  . Essential hypertension 06/06/2016  . Full dentures   . Gout   . Hematuria   .  Wears glasses     Past Surgical History:  Procedure Laterality Date  . ABDOMINAL AORTOGRAM W/LOWER EXTREMITY Bilateral 07/19/2019   Procedure: ABDOMINAL AORTOGRAM W/LOWER EXTREMITY;  Surgeon: Sherren Kerns, MD;  Location: [facility] IN[facility];  Service: Cardiovascular;  Laterality: Bilateral;  . COLON SURGERY  2011   partial colectomy  . COLONOSCOPY    . FINGER ARTHROPLASTY  1990   rt index fx  . HERNIA REPAIR    . INGUINAL HERNIA REPAIR Left 05/27/2014   Procedure: LEFT INGUINAL HERNIA REPAIR WITH MESH;  Surgeon: Wilmon Arms. Corliss Skains, MD;  Location: [facility];  Service: General;  Laterality: Left;  . INSERTION OF MESH Left 05/27/2014   Procedure: INSERTION OF MESH;  Surgeon: Wilmon Arms. Corliss Skains, MD;  Location: [facility];  Service: General;  Laterality: Left;    Social History   Tobacco Use  . Smoking status: Current Every Day Smoker    Packs/day: 2.00  . Smokeless tobacco: Never Used  Substance Use Topics  . Alcohol use: Yes    Alcohol/week: 21.0 standard drinks    Types: 21 Cans of beer per week    Comment: about 3 beers daily- history of heavy liquor abuse  . Drug use: No    Family History  Problem Relation Age of Onset  . Alzheimer's disease Mother   . Cancer Sister   . Neuropathy Neg Hx     Allergies  Allergen Reactions  . Ibuprofen Swelling    Medication list has been reviewed and updated.  Current Outpatient Medications on File Prior to Visit  Medication Sig Dispense Refill  . albuterol (VENTOLIN HFA) 108 (90 Base) MCG/ACT inhaler Inhale 2 puffs into the lungs every 6 (six) hours as needed for wheezing or shortness of breath. 1 Inhaler 6  . allopurinol (ZYLOPRIM) 300 MG tablet Take 1 tablet (300 mg total) by mouth daily. 90 tablet 1  . ALPRAZolam (XANAX) 0.25 MG tablet TAKE 1 TABLET BY MOUTH SCHEDULED EVERY MORNING & MAY REPEAT IN THE EVENING AS NEEDED FOR ANXIETY. 45 tablet 1  .  amLODipine (NORVASC) 10 MG tablet TAKE 1 TABLET BY MOUTH  EVERY DAY 90 tablet 0  . cyanocobalamin 2000 MCG tablet Take 2,000 mcg by mouth daily.    . diphenhydrAMINE HCl, Sleep, (SLEEP-AID) 50 MG CAPS Take 50 mg by mouth at bedtime.    . Fish Oil-Cholecalciferol (FISH OIL + D3 PO) Take by mouth.    . fluticasone furoate-vilanterol (BREO ELLIPTA) 100-25 MCG/INH AEPB Inhale 1 puff into the lungs daily. 28 each 6  . gabapentin (NEURONTIN) 300 MG capsule TAKE 2 CAPSULES BY MOUTH 3 TIMES A DAY (Patient taking differently: Take 600 mg by mouth 3 (three) times daily. ) 540 capsule 1  . HYDROcodone-acetaminophen (NORCO) 7.5-325 MG tablet Take 1 tablet by mouth every 8 (eight) hours as needed for moderate pain. 70 tablet 0  . lisinopril (ZESTRIL) 40 MG tablet Take 1 tablet (40 mg total) by mouth daily. 90 tablet 3  . Multiple Vitamins-Minerals (MULTIVITAMIN WITH MINERALS) tablet Take 1 tablet by mouth daily. Reported on 03/21/2016    . rosuvastatin (CRESTOR) 20 MG tablet Take 1 tablet (20 mg total) by mouth daily. 90 tablet 3  . tadalafil (CIALIS) 20 MG tablet Take 0.5-1 tablets (10-20 mg total) by mouth every other day as needed for erectile dysfunction. 5 tablet 11  . tamsulosin (FLOMAX) 0.4 MG CAPS capsule Take 1 capsule (0.4 mg total) by mouth daily after breakfast. 30 capsule 0  . triamcinolone cream (KENALOG) 0.1 % Apply 1 application topically 2 (two) times daily as needed (eczema on leg). 90 g 2   No current facility-administered medications on file prior to visit.    Review of Systems:  As per HPI- otherwise negative.   Physical Examination: Vitals:   01/08/20 1533  BP: (!) 142/60  Pulse: 64  Resp: 17  Temp: (!) 97.4 F (36.3 C)  SpO2: 96%   Vitals:   01/08/20 1533  Weight: 202 lb (91.6 kg)  Height: 6' (1.829 m)   Body mass index is 27.4 kg/m. Ideal Body Weight: Weight in (lb) to have BMI = 25: 183.9  Jonathan: WDWN, NAD, Non-toxic, A & O x 3, looks well, his normal self HEENT: Atraumatic, Normocephalic. Neck supple. No masses, No  LAD. Ears and Nose: No external deformity. CV: RRR, No M/G/R. No JVD. No thrill. No extra heart sounds. PULM: CTA B, no wheezes, crackles, rhonchi. No retractions. No resp. distress. No accessory muscle use. ABD: S, NT, ND, +BS. No rebound. No HSM. EXTR: No c/c/e NEURO Normal gait.  PSYCH: Normally interactive. Conversant. Not depressed or anxious appearing.  Calm demeanor.    Assessment and Plan: GAD (generalized anxiety disorder)  Chronic bilateral low back pain without sciatica  Dyslipidemia  Medication monitoring encounter - Plan: Pain Mgmt, Profile 8 w/Conf, U  Essential hypertension - Plan: CBC, Comprehensive metabolic panel  Pre-diabetes - Plan: Comprehensive metabolic panel  Chronic idiopathic gout involving toe of right foot without tophus  Avascular necrosis of bones of both hips (HCC)  Sensation of feeling cold - Plan: TSH, Ferritin, CBC, Comprehensive metabolic panel  Tobacco abuse - Plan: nicotine (NICODERM CQ - DOSED IN MG/24 HOURS) 21 mg/24hr patch  History of anemia - Plan: Ferritin, CBC  Today with concern of feeling cold-labs pain as above to look for any possible explanation He is due for his urine drug screen today-annual. Jonathan Harrington is interested somewhat in quitting smoking, he knows this will help with his claudication.  He is already tried Chantix.  He has some interest in trying  nicotine patches, I prescribed these and went over how to taper dose gradually.  Avoid smoking while on patches  Will plan further follow- up pending labs.  This visit occurred during the SARS-CoV-2 public health emergency.  Safety protocols were in place, including screening questions prior to the visit, additional usage of staff PPE, and extensive cleaning of exam room while observing appropriate contact time as indicated for disinfecting solutions.   Moderate medical decision making today Signed Abbe Amsterdam, MD  Received his labs 1/28- message to pt  Results for  orders placed or performed in visit on 01/08/20  TSH  Result Value Ref Range   TSH 1.88 0.35 - 4.50 uIU/mL  Ferritin  Result Value Ref Range   Ferritin 168.8 22.0 - 322.0 ng/mL  CBC  Result Value Ref Range   WBC 6.4 4.0 - 10.5 K/uL   RBC 3.86 (L) 4.22 - 5.81 Mil/uL   Platelets 173.0 150.0 - 400.0 K/uL   Hemoglobin 13.0 13.0 - 17.0 g/dL   HCT 41.9 (L) 62.2 - 29.7 %   MCV 98.7 78.0 - 100.0 fl   MCHC 34.1 30.0 - 36.0 g/dL   RDW 98.9 21.1 - 94.1 %  Comprehensive metabolic panel  Result Value Ref Range   Sodium 138 135 - 145 mEq/L   Potassium 4.9 3.5 - 5.1 mEq/L   Chloride 105 96 - 112 mEq/L   CO2 26 19 - 32 mEq/L   Glucose, Bld 95 70 - 99 mg/dL   BUN 23 6 - 23 mg/dL   Creatinine, Ser 7.40 0.40 - 1.50 mg/dL   Total Bilirubin 0.4 0.2 - 1.2 mg/dL   Alkaline Phosphatase 53 39 - 117 U/L   AST 26 0 - 37 U/L   ALT 18 0 - 53 U/L   Total Protein 7.3 6.0 - 8.3 g/dL   Albumin 4.3 3.5 - 5.2 g/dL   GFR 81.44 >81.85 mL/min   Calcium 9.6 8.4 - 10.5 mg/dL

## 2020-01-07 ENCOUNTER — Other Ambulatory Visit: Payer: Self-pay

## 2020-01-08 ENCOUNTER — Encounter: Payer: Self-pay | Admitting: Family Medicine

## 2020-01-08 ENCOUNTER — Ambulatory Visit (INDEPENDENT_AMBULATORY_CARE_PROVIDER_SITE_OTHER): Payer: Medicare Other | Admitting: Family Medicine

## 2020-01-08 VITALS — BP 142/60 | HR 64 | Temp 97.4°F | Resp 17 | Ht 72.0 in | Wt 202.0 lb

## 2020-01-08 DIAGNOSIS — Z5181 Encounter for therapeutic drug level monitoring: Secondary | ICD-10-CM

## 2020-01-08 DIAGNOSIS — Z862 Personal history of diseases of the blood and blood-forming organs and certain disorders involving the immune mechanism: Secondary | ICD-10-CM

## 2020-01-08 DIAGNOSIS — F411 Generalized anxiety disorder: Secondary | ICD-10-CM | POA: Diagnosis not present

## 2020-01-08 DIAGNOSIS — R7303 Prediabetes: Secondary | ICD-10-CM | POA: Diagnosis not present

## 2020-01-08 DIAGNOSIS — M87052 Idiopathic aseptic necrosis of left femur: Secondary | ICD-10-CM

## 2020-01-08 DIAGNOSIS — I10 Essential (primary) hypertension: Secondary | ICD-10-CM | POA: Diagnosis not present

## 2020-01-08 DIAGNOSIS — M545 Low back pain, unspecified: Secondary | ICD-10-CM

## 2020-01-08 DIAGNOSIS — E785 Hyperlipidemia, unspecified: Secondary | ICD-10-CM

## 2020-01-08 DIAGNOSIS — G8929 Other chronic pain: Secondary | ICD-10-CM

## 2020-01-08 DIAGNOSIS — M1A0710 Idiopathic chronic gout, right ankle and foot, without tophus (tophi): Secondary | ICD-10-CM | POA: Diagnosis not present

## 2020-01-08 DIAGNOSIS — Z720 Tobacco use: Secondary | ICD-10-CM | POA: Diagnosis not present

## 2020-01-08 DIAGNOSIS — R6889 Other general symptoms and signs: Secondary | ICD-10-CM | POA: Diagnosis not present

## 2020-01-08 DIAGNOSIS — M87051 Idiopathic aseptic necrosis of right femur: Secondary | ICD-10-CM | POA: Diagnosis not present

## 2020-01-08 MED ORDER — NICOTINE 21 MG/24HR TD PT24: 21.0000 mg | MEDICATED_PATCH | Freq: Every day | TRANSDERMAL | 3 refills | Status: DC

## 2020-01-08 NOTE — Patient Instructions (Addendum)
Good to see you again today- we will look for any explanation for your feeling cold on your labs today Please also be sure to give a urine sample in the labs  Work on quitting smoking -I agree this would be very helpful for you I gave you an rx for nicotine patches that may help

## 2020-01-09 ENCOUNTER — Encounter: Payer: Self-pay | Admitting: Family Medicine

## 2020-01-09 LAB — COMPREHENSIVE METABOLIC PANEL
ALT: 18 U/L (ref 0–53)
AST: 26 U/L (ref 0–37)
Albumin: 4.3 g/dL (ref 3.5–5.2)
Alkaline Phosphatase: 53 U/L (ref 39–117)
BUN: 23 mg/dL (ref 6–23)
CO2: 26 mEq/L (ref 19–32)
Calcium: 9.6 mg/dL (ref 8.4–10.5)
Chloride: 105 mEq/L (ref 96–112)
Creatinine, Ser: 1.15 mg/dL (ref 0.40–1.50)
GFR: 63.66 mL/min (ref >60.00)
Glucose, Bld: 95 mg/dL (ref 70–99)
Potassium: 4.9 mEq/L (ref 3.5–5.1)
Sodium: 138 mEq/L (ref 135–145)
Total Bilirubin: 0.4 mg/dL (ref 0.2–1.2)
Total Protein: 7.3 g/dL (ref 6.0–8.3)

## 2020-01-09 LAB — CBC
HCT: 38.1 % — ABNORMAL LOW (ref 39.0–52.0)
Hemoglobin: 13 g/dL (ref 13.0–17.0)
MCHC: 34.1 g/dL (ref 30.0–36.0)
MCV: 98.7 fl (ref 78.0–100.0)
Platelets: 173 10*3/uL (ref 150.0–400.0)
RBC: 3.86 Mil/uL — ABNORMAL LOW (ref 4.22–5.81)
RDW: 14.8 % (ref 11.5–15.5)
WBC: 6.4 10*3/uL (ref 4.0–10.5)

## 2020-01-09 LAB — FERRITIN: Ferritin: 168.8 ng/mL (ref 22.0–322.0)

## 2020-01-09 LAB — TSH: TSH: 1.88 u[IU]/mL (ref 0.35–4.50)

## 2020-01-11 LAB — PAIN MGMT, PROFILE 8 W/CONF, U
6 Acetylmorphine: NEGATIVE ng/mL
Alcohol Metabolites: POSITIVE ng/mL — AB (ref <500)
Alphahydroxyalprazolam: 26 ng/mL
Alphahydroxymidazolam: NEGATIVE ng/mL
Alphahydroxytriazolam: NEGATIVE ng/mL
Aminoclonazepam: NEGATIVE ng/mL
Amphetamines: NEGATIVE ng/mL
Benzodiazepines: POSITIVE ng/mL
Buprenorphine, Urine: NEGATIVE ng/mL
Cocaine Metabolite: NEGATIVE ng/mL
Codeine: NEGATIVE ng/mL
Creatinine: 56.1 mg/dL
Ethyl Glucuronide (ETG): 22810 ng/mL
Ethyl Sulfate (ETS): 6185 ng/mL
Hydrocodone: 313 ng/mL
Hydromorphone: 105 ng/mL
Hydroxyethylflurazepam: NEGATIVE ng/mL
Lorazepam: NEGATIVE ng/mL
MDMA: NEGATIVE ng/mL
Marijuana Metabolite: NEGATIVE ng/mL
Morphine: NEGATIVE ng/mL
Nordiazepam: NEGATIVE ng/mL
Norhydrocodone: 242 ng/mL
Opiates: POSITIVE ng/mL
Oxazepam: NEGATIVE ng/mL
Oxidant: NEGATIVE ug/mL
Oxycodone: NEGATIVE ng/mL
Temazepam: NEGATIVE ng/mL
pH: 6.5 (ref 4.5–9.0)

## 2020-01-13 ENCOUNTER — Telehealth: Payer: Self-pay

## 2020-01-13 NOTE — Telephone Encounter (Signed)
Sent patient mychart message

## 2020-01-13 NOTE — Telephone Encounter (Signed)
Patient current medication is not working for him. Patient was wondering if Dr. Patsy Harrington could send in another prescription for him. Can someone please follow up with the patient at [phone] thanks.

## 2020-01-14 NOTE — Telephone Encounter (Signed)
Patient called stating his Cialis did not work-he bought the rx that was filled and each pill was 20 dollars a piece costing him 100. He is upset if he spent that much it didn't work. Also Viagra did not work so , He would like to know if something else could be sent in instead? If something could be sent in please only send in 3 tablets as he does not want to spend extra money if it does not work.

## 2020-01-14 NOTE — Telephone Encounter (Signed)
tadalafil (CIALIS) 20 MG tablet

## 2020-01-15 ENCOUNTER — Telehealth: Payer: Self-pay | Admitting: Family Medicine

## 2020-01-15 MED ORDER — VARDENAFIL HCL 20 MG PO TABS: 20.0000 mg | ORAL_TABLET | Freq: Every day | ORAL | 0 refills | Status: DC | PRN

## 2020-01-15 NOTE — Addendum Note (Signed)
Addended by: Halli Equihua C on: 01/15/2020 11:58 AM   Modules accepted: Orders  

## 2020-01-15 NOTE — Telephone Encounter (Signed)
see previous message sent to you

## 2020-01-15 NOTE — Telephone Encounter (Signed)
Patient would like would to try something other that Cialis for his ED. Pt states that this medication isn't working. Patient has reached out to this office three days in a row no response.   Preferred Pharmacy:  [facility] [address],  - [address]  [address] [place] [address]  Phone:  [phone] Fax:  [phone]

## 2020-01-15 NOTE — Telephone Encounter (Signed)
Called him back, he is failed Viagra and Cialis.  Offered trial of Levitra versus urology referral, he would like to try Levitra first.  Prescription sent  Meds ordered this encounter  Medications  . vardenafil (LEVITRA) 20 MG tablet    Sig: Take 1 tablet (20 mg total) by mouth daily as needed for erectile dysfunction.    Dispense:  2 tablet    Refill:  0

## 2020-01-23 ENCOUNTER — Other Ambulatory Visit: Payer: Self-pay | Admitting: Family Medicine

## 2020-01-23 DIAGNOSIS — M545 Low back pain, unspecified: Secondary | ICD-10-CM

## 2020-01-23 DIAGNOSIS — G8929 Other chronic pain: Secondary | ICD-10-CM

## 2020-01-23 MED ORDER — HYDROCODONE-ACETAMINOPHEN 7.5-325 MG PO TABS: 1.0000 | ORAL_TABLET | Freq: Three times a day (TID) | ORAL | 0 refills | Status: DC | PRN

## 2020-01-23 NOTE — Telephone Encounter (Signed)
Requesting:Hydrocodone  Contract:none UDS:01/08/2020 Last Visit:01/08/2020 Next Visit:none Last Refill:12/25/2019  Please Advise

## 2020-01-23 NOTE — Telephone Encounter (Signed)
Caller Name: Hasson Gaspard, pt Phone: [phone]  Medication: HYDROcodone-acetaminophen (NORCO) 7.5-325 MG tablet - 3-4 pills left  Has the patient contacted their pharmacy? No - controlled medication  Preferred Pharmacy (with phone number or street name):  [id number] [place], [place] - [address] Phone:  [phone]  Fax:  [phone]

## 2020-01-29 ENCOUNTER — Other Ambulatory Visit: Payer: Self-pay | Admitting: Family Medicine

## 2020-01-29 DIAGNOSIS — F411 Generalized anxiety disorder: Secondary | ICD-10-CM

## 2020-01-29 DIAGNOSIS — F4321 Adjustment disorder with depressed mood: Secondary | ICD-10-CM

## 2020-02-10 ENCOUNTER — Telehealth: Payer: Self-pay | Admitting: Family Medicine

## 2020-02-10 MED ORDER — GABAPENTIN 300 MG PO CAPS: 600.0000 mg | ORAL_CAPSULE | Freq: Three times a day (TID) | ORAL | 1 refills | Status: DC

## 2020-02-10 NOTE — Telephone Encounter (Signed)
Refilled gabapentin.   Myra Rude, MD [facility] 02/10/2020, 1:05 PM

## 2020-02-10 NOTE — Telephone Encounter (Signed)
Patient requesting refill of Gabapentin   Pharmacy: [facility] [address] [facility]

## 2020-02-20 ENCOUNTER — Other Ambulatory Visit: Payer: Self-pay | Admitting: Family Medicine

## 2020-02-20 DIAGNOSIS — G8929 Other chronic pain: Secondary | ICD-10-CM

## 2020-02-20 MED ORDER — HYDROCODONE-ACETAMINOPHEN 7.5-325 MG PO TABS: 1.0000 | ORAL_TABLET | Freq: Three times a day (TID) | ORAL | 0 refills | Status: DC | PRN

## 2020-02-20 NOTE — Telephone Encounter (Signed)
Medication: HYDROcodone-acetaminophen (NORCO) 7.5-325 MG tablet [[id number]]    Has the patient contacted their pharmacy? No. (If no, request that the patient contact the pharmacy for the refill.) (If yes, when and what did the pharmacy advise?)  Preferred Pharmacy (with phone number or street name): [id number] [place], [place] - [address]  [address] [place] [address]  Phone:  [phone] Fax:  [phone]  DEA #:  [id number]  Agent: Please be advised that RX refills may take up to 3 business days. We ask that you follow-up with your pharmacy.

## 2020-02-20 NOTE — Telephone Encounter (Signed)
Requesting:Hydrocodone Contract:none UDS:01/08/2020 Last Visit:01/08/2020 Next Visit:none Last Refill:01/23/2020  Please Advise

## 2020-03-08 ENCOUNTER — Other Ambulatory Visit: Payer: Self-pay | Admitting: Family Medicine

## 2020-03-08 DIAGNOSIS — I10 Essential (primary) hypertension: Secondary | ICD-10-CM

## 2020-03-12 ENCOUNTER — Ambulatory Visit: Payer: Medicare Other | Attending: Internal Medicine

## 2020-03-12 ENCOUNTER — Ambulatory Visit: Payer: Medicare Other

## 2020-03-12 DIAGNOSIS — Z23 Encounter for immunization: Secondary | ICD-10-CM

## 2020-03-12 NOTE — Progress Notes (Signed)
   Covid-19 Vaccination Clinic  Name:  CLARICE BONAVENTURE    MRN: [id number] DOB: 1956-09-02  03/12/2020  Mr. Kubitz was observed post Covid-19 immunization for 30 minutes based on pre-vaccination screening without incident. He was provided with Vaccine Information Sheet and instruction to access the V-Safe system.   Mr. Maslow was instructed to call 911 with any severe reactions post vaccine: Marland Kitchen Difficulty breathing  . Swelling of face and throat  . A fast heartbeat  . A bad rash all over body  . Dizziness and weakness   Immunizations Administered    Name Date Dose VIS Date Route   Pfizer COVID-19 Vaccine 03/12/2020  1:18 PM 0.3 mL 11/22/2019 Intramuscular   Manufacturer: [employer], [employer]   Lot: [id number]   NDC: [address]-4

## 2020-03-23 ENCOUNTER — Telehealth: Payer: Self-pay | Admitting: Family Medicine

## 2020-03-23 DIAGNOSIS — G8929 Other chronic pain: Secondary | ICD-10-CM

## 2020-03-23 MED ORDER — HYDROCODONE-ACETAMINOPHEN 7.5-325 MG PO TABS: 1.0000 | ORAL_TABLET | Freq: Three times a day (TID) | ORAL | 0 refills | Status: DC | PRN

## 2020-03-23 NOTE — Telephone Encounter (Signed)
Requesting: Hydrocodone Contract:none UDS:01/08/2020 Last Visit:01/08/2020 Next Visit:none scheduled Last Refill:02/20/2020  Please Advise

## 2020-03-23 NOTE — Telephone Encounter (Signed)
Patient would like to refill medications HYDROcodone-acetaminophen (NORCO) 7.5-325 MG tablet please advise .

## 2020-04-06 ENCOUNTER — Ambulatory Visit: Payer: Medicare Other | Attending: Internal Medicine

## 2020-04-06 DIAGNOSIS — Z23 Encounter for immunization: Secondary | ICD-10-CM

## 2020-04-06 NOTE — Progress Notes (Signed)
   Covid-19 Vaccination Clinic  Name:  Jonathan Harrington    MRN: [id number] DOB: 15-Aug-1956  04/06/2020  Jonathan Harrington was observed post Covid-19 immunization for 30 minutes based on pre-vaccination screening without incident. He was provided with Vaccine Information Sheet and instruction to access the V-Safe system.   Jonathan Harrington was instructed to call 911 with any severe reactions post vaccine: Marland Kitchen Difficulty breathing  . Swelling of face and throat  . A fast heartbeat  . A bad rash all over body  . Dizziness and weakness   Immunizations Administered    Name Date Dose VIS Date Route   Pfizer COVID-19 Vaccine 04/06/2020 10:08 AM 0.3 mL 02/05/2019 Intramuscular   Manufacturer: [employer], [employer]   Lot: [id number]   NDC: [address]-7

## 2020-04-15 ENCOUNTER — Other Ambulatory Visit: Payer: Self-pay | Admitting: Family Medicine

## 2020-04-15 DIAGNOSIS — M1000 Idiopathic gout, unspecified site: Secondary | ICD-10-CM

## 2020-04-16 ENCOUNTER — Other Ambulatory Visit: Payer: Self-pay

## 2020-04-16 ENCOUNTER — Encounter: Payer: Self-pay | Admitting: Family Medicine

## 2020-04-16 ENCOUNTER — Ambulatory Visit (INDEPENDENT_AMBULATORY_CARE_PROVIDER_SITE_OTHER): Payer: Medicare Other | Admitting: Family Medicine

## 2020-04-16 DIAGNOSIS — G629 Polyneuropathy, unspecified: Secondary | ICD-10-CM | POA: Diagnosis not present

## 2020-04-16 NOTE — Patient Instructions (Signed)
Good to see you Please try increasing the gabapentin. Do not take more than 3600 mg per day.  Let me know in 2 weeks if no better and we can try an MRI  Please send me a message in MyChart with any questions or updates.  Follow up will depend on if we get imaging otherwise 4 weeks.   --Dr. Jordan Likes

## 2020-04-16 NOTE — Assessment & Plan Note (Signed)
Pain is ongoing.  Unclear if this is related to his vascular disease or radicular nature.  May have a component of the L5-S1 dermatome. -Counseled supportive care. -We will increase his dose of gabapentin. -If no improvement can consider MRI of the lumbar spine for consideration of epidural or pursue nerve conduction study.

## 2020-04-16 NOTE — Progress Notes (Signed)
Jonathan Harrington - 64 y.o. male MRN [id number]  Date of birth: 09-30-1956  SUBJECTIVE:  Including CC & ROS.  Chief Complaint  Patient presents with  . Foot Pain    right foot    Jonathan Harrington is a 64 y.o. male that is following up for his bilateral foot pain.  He feels it mainly between the first and second digit on the right.  He feels that in the digits on the left.  He has a history of vascular disease.  He had improvement initially with the gabapentin.  Seems to not work as well today.  He has had a history of ongoing back pain.   Review of Systems See HPI   HISTORY: Past Medical, Surgical, Social, and Family History Reviewed & Updated per EMR.   Pertinent Historical Findings include:  Past Medical History:  Diagnosis Date  . Alcoholism ([place])   . Anxiety   . COPD (chronic obstructive pulmonary disease) ([place])   . Diverticulitis 2011   resulted in partial colectomy  . Essential hypertension 06/06/2016  . Full dentures   . Gout   . Hematuria   . Wears glasses     Past Surgical History:  Procedure Laterality Date  . ABDOMINAL AORTOGRAM W/LOWER EXTREMITY Bilateral 07/19/2019   Procedure: ABDOMINAL AORTOGRAM W/LOWER EXTREMITY;  Surgeon: Elam Dutch, MD;  Location: [facility];  Service: Cardiovascular;  Laterality: Bilateral;  . COLON SURGERY  2011   partial colectomy  . COLONOSCOPY    . FINGER ARTHROPLASTY  1990   rt index fx  . HERNIA REPAIR    . INGUINAL HERNIA REPAIR Left 05/27/2014   Procedure: LEFT INGUINAL HERNIA REPAIR WITH MESH;  Surgeon: Imogene Burn. Georgette [place], MD;  Location: [facility];  Service: General;  Laterality: Left;  . INSERTION OF MESH Left 05/27/2014   Procedure: INSERTION OF MESH;  Surgeon: Imogene Burn. Georgette [place], MD;  Location: [facility];  Service: General;  Laterality: Left;    Family History  Problem Relation Age of Onset  . Alzheimer's disease Mother   . Cancer Sister   . Neuropathy Neg Hx     Social History    Socioeconomic History  . Marital status: Widowed    Spouse name: died 04/08/2016  . Number of children: 1  . Years of education: Not on file  . Highest education level: Not on file  Occupational History  . Occupation: Manufacturing systems engineer  Tobacco Use  . Smoking status: Current Every Day Smoker    Packs/day: 2.00  . Smokeless tobacco: Never Used  Substance and Sexual Activity  . Alcohol use: Yes    Alcohol/week: 21.0 standard drinks    Types: 21 Cans of beer per week    Comment: about 3 beers daily- history of heavy liquor abuse  . Drug use: No  . Sexual activity: Not on file  Other Topics Concern  . Not on file  Social History Narrative   Lives alone following the death of his wife 08-Apr-2016 following 52 years of marriage.   His son lives in the [place] area, and visits on weekends.   Social Determinants of Health   Financial Resource Strain:   . Difficulty of Paying Living Expenses:   Food Insecurity:   . Worried About Charity fundraiser in the Last Year:   . Arboriculturist in the Last Year:   Transportation Needs:   . Film/video editor (Medical):   Marland Kitchen Lack of  Transportation (Non-Medical):   Physical Activity:   . Days of Exercise per Week:   . Minutes of Exercise per Session:   Stress:   . Feeling of Stress :   Social Connections:   . Frequency of Communication with Friends and Family:   . Frequency of Social Gatherings with Friends and Family:   . Attends Religious Services:   . Active Member of Clubs or Organizations:   . Attends Banker Meetings:   Marland Kitchen Marital Status:   Intimate Partner Violence:   . Fear of Current or Ex-Partner:   . Emotionally Abused:   Marland Kitchen Physically Abused:   . Sexually Abused:      PHYSICAL EXAM:  VS: BP (!) 163/72   Pulse 99   Ht 6' (1.829 m)   Wt 195 lb (88.5 kg)   BMI 26.45 kg/m  Physical Exam Gen: NAD, alert, cooperative with exam, well-appearing MSK:  Right and left leg: Normal strength to  resistance. Neurovascular intact     ASSESSMENT & PLAN:   Neuropathy Pain is ongoing.  Unclear if this is related to his vascular disease or radicular nature.  May have a component of the L5-S1 dermatome. -Counseled supportive care. -We will increase his dose of gabapentin. -If no improvement can consider MRI of the lumbar spine for consideration of epidural or pursue nerve conduction study.

## 2020-04-22 ENCOUNTER — Encounter: Payer: Self-pay | Admitting: Family Medicine

## 2020-04-22 ENCOUNTER — Telehealth: Payer: Self-pay | Admitting: Family Medicine

## 2020-04-22 DIAGNOSIS — F4321 Adjustment disorder with depressed mood: Secondary | ICD-10-CM

## 2020-04-22 DIAGNOSIS — F411 Generalized anxiety disorder: Secondary | ICD-10-CM

## 2020-04-22 MED ORDER — ALPRAZOLAM 0.25 MG PO TABS: ORAL_TABLET | ORAL | 1 refills | Status: DC

## 2020-04-22 NOTE — Telephone Encounter (Signed)
Requesting:  Alprazolam and Hydrocodone Contract:  none UDS:   01/08/2020 Last Visit:   01/08/2020 Next Visit:  none Last Refill:  Alprazolam--  #45 with 1 refill -- 01/30/2020                    Hydrocodone-- #70 no refills -- 03/23/2020  Please Advise

## 2020-04-22 NOTE — Telephone Encounter (Signed)
Medication: ALPRAZolam (XANAX) 0.25 MG tablet [[id number]]  HYDROcodone-acetaminophen (NORCO) 7.5-325 MG tablet [[id number]]    Has the patient contacted their pharmacy? No. (If no, request that the patient contact the pharmacy for the refill.) (If yes, when and what did the pharmacy advise?)  Preferred Pharmacy (with phone number or street name): [id number] [place], [place] - [address]  [address] [place] [address]  Phone:  [phone] Fax:  [phone]  DEA #:  [id number]  Agent: Please be advised that RX refills may take up to 3 business days. We ask that you follow-up with your pharmacy.

## 2020-04-24 ENCOUNTER — Other Ambulatory Visit: Payer: Self-pay

## 2020-04-24 DIAGNOSIS — G8929 Other chronic pain: Secondary | ICD-10-CM

## 2020-04-24 DIAGNOSIS — M545 Low back pain, unspecified: Secondary | ICD-10-CM

## 2020-04-24 MED ORDER — HYDROCODONE-ACETAMINOPHEN 7.5-325 MG PO TABS: 1.0000 | ORAL_TABLET | Freq: Three times a day (TID) | ORAL | 0 refills | Status: DC | PRN

## 2020-04-24 NOTE — Telephone Encounter (Signed)
After hours call: Requesting refill on hydrocodone.

## 2020-04-24 NOTE — Telephone Encounter (Signed)
Patient called requesting his hydrocodone to be refilled. Requested on 5/12- not sure if it went through?   Requesting: hydrocodone Contract:none UDS:01/08/2020 Last Visit:01/08/2020 Next Visit:none scheduled Last Refill:03/23/2020  Please Advise

## 2020-05-22 ENCOUNTER — Other Ambulatory Visit: Payer: Self-pay | Admitting: *Deleted

## 2020-05-22 DIAGNOSIS — I70213 Atherosclerosis of native arteries of extremities with intermittent claudication, bilateral legs: Secondary | ICD-10-CM

## 2020-05-25 ENCOUNTER — Other Ambulatory Visit: Payer: Self-pay | Admitting: Family Medicine

## 2020-05-25 DIAGNOSIS — G8929 Other chronic pain: Secondary | ICD-10-CM

## 2020-05-25 MED ORDER — HYDROCODONE-ACETAMINOPHEN 7.5-325 MG PO TABS: 1.0000 | ORAL_TABLET | Freq: Three times a day (TID) | ORAL | 0 refills | Status: DC | PRN

## 2020-05-25 NOTE — Telephone Encounter (Signed)
Requesting: Hydrocodone Contract: none UDS:01/08/2020 Last Visit:01/08/2020 Next Visit:none scheduled Last Refill:04/24/2020  Please Advise

## 2020-05-25 NOTE — Telephone Encounter (Signed)
Medication: HYDROcodone-acetaminophen (NORCO) 7.5-325 MG tablet   Has the patient contacted their pharmacy? No. (If no, request that the patient contact the pharmacy for the refill.) (If yes, when and what did the pharmacy advise?)  Preferred Pharmacy (with phone number or street name):  [id number] [place], [place] - [address]  [address] [place] [address]  Phone:  [phone]  Fax:  [phone]   Agent: Please be advised that RX refills may take up to 3 business days. We ask that you follow-up with your pharmacy.  

## 2020-05-26 ENCOUNTER — Ambulatory Visit (HOSPITAL_COMMUNITY)
Admission: RE | Admit: 2020-05-26 | Discharge: 2020-05-26 | Disposition: A | Discharge status: Home / Self Care | Payer: Medicare Other | Source: Ambulatory Visit | Attending: Vascular Surgery | Admitting: Vascular Surgery

## 2020-05-26 ENCOUNTER — Ambulatory Visit (INDEPENDENT_AMBULATORY_CARE_PROVIDER_SITE_OTHER): Payer: Medicare Other | Admitting: Vascular Surgery

## 2020-05-26 ENCOUNTER — Encounter: Payer: Self-pay | Admitting: Vascular Surgery

## 2020-05-26 ENCOUNTER — Other Ambulatory Visit: Payer: Self-pay

## 2020-05-26 VITALS — BP 130/73 | HR 88 | Temp 97.7°F | Resp 20 | Ht 72.0 in | Wt 193.5 lb

## 2020-05-26 DIAGNOSIS — I70213 Atherosclerosis of native arteries of extremities with intermittent claudication, bilateral legs: Secondary | ICD-10-CM

## 2020-05-26 NOTE — Progress Notes (Signed)
[facility] Specialist of [place]  Patient name: Jonathan Harrington MRN: [id number] DOB: 1956/08/27 Sex: male  REASON FOR VISIT: Follow-up bilateral follow-up bilateral lower extremity claudication symptoms  HPI: Jonathan Harrington is a 64 y.o. male here today for follow-up.  Saw him in August 2020.  At that time he underwent arteriography revealing bilateral superficial femoral artery occlusions.  He does have limiting Claudication bilaterally.  This is worse with walking up a grade.  He also has significant bilateral peripheral neuropathy.  He fortunately has no tissue loss.  He does continue to smoke cigarettes.  Past Medical History:  Diagnosis Date  . Alcoholism ([place])   . Allergy   . Anxiety   . COPD (chronic obstructive pulmonary disease) ([place])   . Diverticulitis 2011   resulted in partial colectomy  . Essential hypertension 06/06/2016  . Full dentures   . Gout   . Hematuria   . Wears glasses     Family History  Problem Relation Age of Onset  . Alzheimer's disease Mother   . Cancer Sister   . Neuropathy Neg Hx     SOCIAL HISTORY: Social History   Tobacco Use  . Smoking status: Current Every Day Smoker    Packs/day: 2.00  . Smokeless tobacco: Never Used  Substance Use Topics  . Alcohol use: Yes    Alcohol/week: 21.0 standard drinks    Types: 21 Cans of beer per week    Comment: about 3 beers daily- history of heavy liquor abuse    Allergies  Allergen Reactions  . Ibuprofen Swelling    Current Outpatient Medications  Medication Sig Dispense Refill  . albuterol (VENTOLIN HFA) 108 (90 Base) MCG/ACT inhaler Inhale 2 puffs into the lungs every 6 (six) hours as needed for wheezing or shortness of breath. 1 Inhaler 6  . allopurinol (ZYLOPRIM) 300 MG tablet TAKE 1 TABLET BY MOUTH EVERY DAY 90 tablet 1  . ALPRAZolam (XANAX) 0.25 MG tablet TAKE 1 TABLET BY MOUTH SCHEDULED EVERY MORNING & MAY REPEAT IN THE EVENING AS NEEDED FOR  ANXIETY. 45 tablet 1  . amLODipine (NORVASC) 10 MG tablet TAKE 1 TABLET BY MOUTH EVERY DAY 90 tablet 0  . cyanocobalamin 2000 MCG tablet Take 2,000 mcg by mouth daily.    . diphenhydrAMINE HCl, Sleep, (SLEEP-AID) 50 MG CAPS Take 50 mg by mouth at bedtime.    . Fish Oil-Cholecalciferol (FISH OIL + D3 PO) Take by mouth.    . fluticasone furoate-vilanterol (BREO ELLIPTA) 100-25 MCG/INH AEPB Inhale 1 puff into the lungs daily. 28 each 6  . gabapentin (NEURONTIN) 300 MG capsule Take 2 capsules (600 mg total) by mouth 3 (three) times daily. 540 capsule 1  . HYDROcodone-acetaminophen (NORCO) 7.5-325 MG tablet Take 1 tablet by mouth every 8 (eight) hours as needed for moderate pain. 70 tablet 0  . lisinopril (ZESTRIL) 40 MG tablet Take 1 tablet (40 mg total) by mouth daily. 90 tablet 3  . Multiple Vitamins-Minerals (MULTIVITAMIN WITH MINERALS) tablet Take 1 tablet by mouth daily. Reported on 03/21/2016    . nicotine (NICODERM CQ - DOSED IN MG/24 HOURS) 21 mg/24hr patch Place 1 patch (21 mg total) onto the skin daily. Taper as tolerated 28 patch 3  . rosuvastatin (CRESTOR) 20 MG tablet Take 1 tablet (20 mg total) by mouth daily. 90 tablet 3  . tadalafil (CIALIS) 20 MG tablet Take 0.5-1 tablets (10-20 mg total) by mouth every other day as needed for erectile dysfunction. 5 tablet  11  . tamsulosin (FLOMAX) 0.4 MG CAPS capsule Take 1 capsule (0.4 mg total) by mouth daily after breakfast. 30 capsule 0  . triamcinolone cream (KENALOG) 0.1 % Apply 1 application topically 2 (two) times daily as needed (eczema on leg). 90 g 2  . vardenafil (LEVITRA) 20 MG tablet Take 1 tablet (20 mg total) by mouth daily as needed for erectile dysfunction. 2 tablet 0   No current facility-administered medications for this visit.    REVIEW OF SYSTEMS:  [X]  denotes positive finding, [ ]  denotes negative finding Cardiac  Comments:  Chest pain or chest pressure:    Shortness of breath upon exertion:    Short of breath when lying  flat:    Irregular heart rhythm:        Vascular    Pain in calf, thigh, or hip brought on by ambulation: x   Pain in feet at night that wakes you up from your sleep:     Blood clot in your veins:    Leg swelling:           PHYSICAL EXAM: Vitals:   05/26/20 1255  BP: 130/73  Pulse: 88  Resp: 20  Temp: 97.7 F (36.5 C)  SpO2: 93%  Weight: 193 lb 8 oz (87.8 kg)  Height: 6' (1.829 m)    GENERAL: The patient is a well-nourished male, in no acute distress. The vital signs are documented above. CARDIOVASCULAR: Dopplerable femoral pulses bilaterally.  Absent distal pulses bilaterally PULMONARY: There is good air exchange  MUSCULOSKELETAL: There are no major deformities or cyanosis. NEUROLOGIC: No focal weakness or paresthesias are detected. SKIN: There are no ulcers or rashes noted. PSYCHIATRIC: The patient has a normal affect.  DATA:  Noninvasive studies today are similar to his studies from 1 year ago.  His ankle arm index is 0.6 bilaterally.  MEDICAL ISSUES: Had extensive discussion with the patient.  I have again recommended conservative treatment.  I did explain that he could undergo treatment for superficial femoral artery occlusions and in all likelihood could have resolution of his claudication.  I feel that at his young age and health that he would have significant risk of failure over time requiring repeat intervention.  I would recommend continued walking program and reserve bypass for critical limb ischemia which is not present or claudication that he is not able to tolerate.  This is particularly in light of his bilateral disease which would require bilateral interventions.  Also in light of his significant neuropathy which would not be corrected by revascularization.  He is comfortable with this plan and will notify should he develop any progressive disease.  Otherwise he will see 05/28/20 again as needed    07-16-1985, MD [facility] and Ve[facility] Specialists of  [facility] Tel [phone] Pager [phone]

## 2020-05-27 ENCOUNTER — Other Ambulatory Visit: Payer: Self-pay | Admitting: Family Medicine

## 2020-05-27 DIAGNOSIS — F411 Generalized anxiety disorder: Secondary | ICD-10-CM

## 2020-05-27 DIAGNOSIS — F4321 Adjustment disorder with depressed mood: Secondary | ICD-10-CM

## 2020-05-27 MED ORDER — ALPRAZOLAM 0.25 MG PO TABS: ORAL_TABLET | ORAL | 1 refills | Status: DC

## 2020-05-27 NOTE — Telephone Encounter (Signed)
Medication: ALPRAZolam (XANAX) 0.25 MG tablet [[id number]]   Has the patient contacted their pharmacy? No. (If no, request that the patient contact the pharmacy for the refill.) (If yes, when and what did the pharmacy advise?)  Preferred Pharmacy (with phone number or street name): [id number], [place] - [address]  [address] [place] [address]  Phone:  [phone] Fax:  [phone]  DEA #:  [id number]  Agent: Please be advised that RX refills may take up to 3 business days. We ask that you follow-up with your pharmacy.

## 2020-05-27 NOTE — Addendum Note (Signed)
Addended by: Abbe Amsterdam C on: 05/27/2020 06:21 PM   Modules accepted: Orders

## 2020-05-27 NOTE — Telephone Encounter (Signed)
Requesting:Alprazolam Contract:none UDS:01/08/2020 Last Visit:01/08/2020 Next Visit: None scheduled Last Refill: 05/23/2020  Please Advise

## 2020-05-27 NOTE — Addendum Note (Signed)
Addended by: Steve Rattler A on: 05/27/2020 03:33 PM   Modules accepted: Orders

## 2020-06-29 ENCOUNTER — Telehealth: Payer: Self-pay

## 2020-06-29 DIAGNOSIS — F4321 Adjustment disorder with depressed mood: Secondary | ICD-10-CM

## 2020-06-29 DIAGNOSIS — M545 Low back pain, unspecified: Secondary | ICD-10-CM

## 2020-06-29 DIAGNOSIS — F411 Generalized anxiety disorder: Secondary | ICD-10-CM

## 2020-06-29 DIAGNOSIS — G8929 Other chronic pain: Secondary | ICD-10-CM

## 2020-06-29 MED ORDER — ALPRAZOLAM 0.25 MG PO TABS: ORAL_TABLET | ORAL | 2 refills | Status: DC

## 2020-06-29 MED ORDER — HYDROCODONE-ACETAMINOPHEN 7.5-325 MG PO TABS: 1.0000 | ORAL_TABLET | Freq: Three times a day (TID) | ORAL | 0 refills | Status: DC | PRN

## 2020-06-29 NOTE — Telephone Encounter (Signed)
Pt called to check status of medication refills, patient states that he is complenty out.

## 2020-06-29 NOTE — Telephone Encounter (Signed)
Requesting:Alprazolam and Hydrocodone Contract: none needs csc UDS:01/08/2020 Last Visit:01/08/2020 Next Visit: none scheduled Last Refill: Alprazolam 05/27/2020 45 tablets 1 refill --Hydrocodone 05/25/2020 70 tablets 0 refills.   Please Advise

## 2020-06-29 NOTE — Telephone Encounter (Signed)
Nurse Assessment Nurse: Anner Crete, RN, Olegario Messier Date/Time (Eastern Time): 06/27/2020 1:07:02 PM Confirm and document reason for call. If symptomatic, describe symptoms. ---Caller stated he needs a refill on his Xanax and Hydrocodone. Rx was to be called in yesterday but has not heard from the pharmacy that it has been filled. He is totally out of both medications. Has the patient had close contact with a person known or suspected to have the novel coronavirus illness OR traveled / lives in area with major community spread (including international travel) in the last 14 days from the onset of symptoms? * If Asymptomatic, screen for exposure and travel within the last 14 days. ---No Does the patient have any new or worsening symptoms? ---No Please document clinical information provided and list any resource used. ---I explained to patient I could not call in refills for medications but would forward his message to office for Monday. Disp. Time Jonathan Harrington Time) Disposition Final User 06/27/2020 12:21:49 PM Send To RN Personal Lane Hacker, RN, Windy 06/27/2020 12:45:02 PM Attempt made - message left Anner Crete, RN, Olegario Messier 06/27/2020 1:08:49 PM Clinical Call Yes Anner Crete, RN, Olegario Messier

## 2020-07-11 ENCOUNTER — Other Ambulatory Visit: Payer: Self-pay | Admitting: Family Medicine

## 2020-07-11 DIAGNOSIS — I10 Essential (primary) hypertension: Secondary | ICD-10-CM

## 2020-07-31 ENCOUNTER — Other Ambulatory Visit: Payer: Self-pay | Admitting: Family Medicine

## 2020-07-31 DIAGNOSIS — F4321 Adjustment disorder with depressed mood: Secondary | ICD-10-CM

## 2020-07-31 DIAGNOSIS — F411 Generalized anxiety disorder: Secondary | ICD-10-CM

## 2020-07-31 DIAGNOSIS — G8929 Other chronic pain: Secondary | ICD-10-CM

## 2020-07-31 MED ORDER — ALPRAZOLAM 0.25 MG PO TABS: ORAL_TABLET | ORAL | 2 refills | Status: DC

## 2020-07-31 MED ORDER — HYDROCODONE-ACETAMINOPHEN 7.5-325 MG PO TABS: 1.0000 | ORAL_TABLET | Freq: Three times a day (TID) | ORAL | 0 refills | Status: DC | PRN

## 2020-07-31 NOTE — Telephone Encounter (Signed)
Did refills for pt.  He is due for a visit  Called and let him know he needs a visit, he will schedule soon

## 2020-07-31 NOTE — Telephone Encounter (Signed)
Medication: HYDROcodone-acetaminophen (NORCO) 7.5-325 MG tablet   ALPRAZolam (XANAX) 0.25 MG tablet [[id number]]    Has the patient contacted their pharmacy? No. (If no, request that the patient contact the pharmacy for the refill.) (If yes, when and what did the pharmacy advise?)  Preferred Pharmacy (with phone number or street name): [id number], [place] - [address]  [address] [place] [address]  Phone:  [phone] Fax:  [phone]  DEA #:  [id number]  Agent: Please be advised that RX refills may take up to 3 business days. We ask that you follow-up with your pharmacy.

## 2020-07-31 NOTE — Telephone Encounter (Signed)
Requesting:Hydrocodone and Alprazolam Contract: none UDS: 01/08/2020 Last Visit: 01/08/2020 Next Visit: none scheduled with pcp Last Refill: 06/29/2020  Please Advise

## 2020-08-12 ENCOUNTER — Other Ambulatory Visit: Payer: Self-pay | Admitting: Family Medicine

## 2020-08-13 ENCOUNTER — Other Ambulatory Visit: Payer: Self-pay | Admitting: Family Medicine

## 2020-08-13 DIAGNOSIS — E785 Hyperlipidemia, unspecified: Secondary | ICD-10-CM

## 2020-08-18 ENCOUNTER — Telehealth: Payer: Self-pay | Admitting: Family Medicine

## 2020-08-18 MED ORDER — GABAPENTIN 300 MG PO CAPS
900.0000 mg | ORAL_CAPSULE | Freq: Three times a day (TID) | ORAL | 1 refills | Status: DC
Start: 2020-08-18 — End: 2020-10-22

## 2020-08-18 NOTE — Telephone Encounter (Signed)
Patient called to see why no response to pharmacy for Rx refill on Gabapentin request--advised would send message to provider for review & response.  --Per pt increase in dosage approved but no refill for increase to pharmacy  --Pt advised would be contacted w/ update @ 1st available moment.  --glh

## 2020-08-18 NOTE — Telephone Encounter (Signed)
Having ongoing symptoms but they are mild and intermittent. Will refill gabapentin. Could consider nerve study or MRi if ongoing.   Myra Rude, MD [facility] 08/18/2020, 2:27 PM

## 2020-08-22 NOTE — Progress Notes (Addendum)
[facility] [address] [place], [place] [address] [phone]  Date:  08/24/2020   Name:  Jonathan Harrington   DOB:  01-Aug-1956   MRN:  [id number]  PCP:  Pearline Cables, MD    Chief Complaint: GAD (6 month follow up) and Flu Vaccine   History of Present Illness:  Jonathan Harrington is a 64 y.o. 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 Leaning on a shopping cart helps   He has not had any luck with oral vasodilators for ED- he would like to see urology  He can be seen in [place] or [place] He is seeing a new woman named Verlon Au- his wife passed away a few years ago The relationship is going very well, it is a positive thing in his life  Has completed COVID-19 series [facility] done in January UDS is up-to-date-could use A1c, lipids, PSA today Flu shot today  pneumonvax today as well   07/31/2020  2   07/31/2020  Alprazolam 0.25 MG Tablet  45.00  30 Je Cop   [id number]   Nor (2372)   0/2  0.75 LME  Medicare   [place]  07/31/2020  2   07/31/2020  Hydrocodone-Acetamin 7.5-325  70.00  23 Je Cop   [id number]   Nor (2372)   0/0  22.83 MME  Medicare   [place]  06/29/2020  2   06/29/2020  Hydrocodone-Acetamin 7.5-325  70.00  23 Je Cop   [id number]   Nor (2372)   0/0  22.83 MME  Medicare   [place]  06/29/2020  2   06/29/2020  Alprazolam 0.25 MG Tablet  45.00  30 Je Cop   [id number]   [place] (2372)   0/2  0.75 LME  Medicare   [place]  05/27/2020  2    05/27/2020  Alprazolam 0.25 MG Tablet  45.00  30 Je Cop   [id number]   Nor (2372)   0/1  0.75 LME  Medicare   [place]  05/25/2020  2   05/25/2020  Hydrocodone-Acetamin 7.5-325  70.00  24 Je Cop   [id number]   Nor (2372)   0/0  21.88 MME  Medicare   [place]  04/24/2020  2   04/24/2020  Hydrocodone-Acetamin 7.5-325  70.00  23 Je Cop   [id number]   Nor (2372)   0/0  22.83 MME  Medicare   [place]  04/23/2020  2   04/22/2020  Alprazolam 0.25 MG Tablet  45.00  22 Je Cop   [id number]   Nor (2372)   0/1  1.02 LME  Medicare   [place]  03/23/2020  2   03/23/2020  Hydrocodone-Acetamin 7.5-325  70.00  23 Je Cop   [id number]   Nor (2372)   0/0  22.83 MME  Medicare   [place]  03/08/2020  2   01/30/2020  Alprazolam 0.25 MG Tablet  45.00  27 Je Cop   [id number]  Nor (2372)   1/1  0.83 LME  Medicare   [place]  02/21/2020  2   02/20/2020  Hydrocodone-Acetamin 7.5-325  70.00  23 Je Cop   [id number]   Nor (2372)   0/0  22.83 MME       Patient Active Problem List   Diagnosis Date Noted  . Neuropathy 06/04/2019  . Alcoholism ([facility]) 11/19/2018  . Essential hypertension 06/06/2016  . Smoker 05/12/2016  . Left inguinal hernia 05/23/2014  . Anxiety state, unspecified 09/17/2013  . Gout 01/17/2013  . DIVERTICULOSIS-COLON 08/13/2010  . DIVERTICULITIS, COLON 08/13/2010  . ABDOMINAL PAIN-LLQ 08/13/2010  . Nonspecific (abnormal) findings on radiological and other examination of body structure 08/13/2010  . PERSONAL HX COLONIC POLYPS 08/13/2010  . NONSPCIFC ABN FINDING RAD & OTH EXAM LUNG FIELD 08/13/2010    Past Medical History:  Diagnosis Date  . Alcoholism ([facility])   . Allergy   . Anxiety   . COPD (chronic obstructive pulmonary disease) ([facility])   . Diverticulitis 2011   resulted in partial colectomy  . Essential hypertension 06/06/2016  . Full dentures   . Gout   . Hematuria   . Wears glasses     Past Surgical History:  Procedure Laterality Date  . ABDOMINAL AORTOGRAM W/LOWER EXTREMITY Bilateral 07/19/2019   Procedure: ABDOMINAL AORTOGRAM W/LOWER EXTREMITY;   Surgeon: Sherren KernsFields, Charles E, MD;  [facility];  Service: Cardiovascular;  Laterality: Bilateral;  . COLON SURGERY  2011   partial colectomy  . COLONOSCOPY    . FINGER ARTHROPLASTY  1990   rt index fx  . HERNIA REPAIR    . INGUINAL HERNIA REPAIR Left 05/27/2014   Procedure: LEFT INGUINAL HERNIA REPAIR WITH MESH;  Surgeon: Wilmon ArmsMatthew K. Corliss Skainssuei, MD;  Location: [facility];  Service: General;  Laterality: Left;  . INSERTION OF MESH Left 05/27/2014   Procedure: INSERTION OF MESH;  Surgeon: Wilmon ArmsMatthew K. Corliss Skainssuei, MD;  Location: [facility];  Service: General;  Laterality: Left;    Social History   Tobacco Use  . Smoking status: Current Every Day Smoker    Packs/day: 2.00  . Smokeless tobacco: Never Used  Vaping Use  . Vaping Use: Never used  Substance Use Topics  . Alcohol use: Yes    Alcohol/week: 21.0 standard drinks    Types: 21 Cans of beer per week    Comment: about 3 beers daily- history of heavy liquor abuse  . Drug use: No    Family History  Problem Relation Age of Onset  . Alzheimer's disease Mother   . Cancer Sister   . Neuropathy Neg Hx     Allergies  Allergen Reactions  . Ibuprofen Swelling    Medication list has been reviewed and updated.  Current Outpatient Medications on File Prior to Visit  Medication Sig Dispense Refill  . albuterol (VENTOLIN HFA) 108 (90 Base) MCG/ACT inhaler Inhale 2 puffs into the lungs every 6 (six) hours as needed for wheezing or shortness of breath. 1 Inhaler 6  . allopurinol (ZYLOPRIM) 300 MG tablet TAKE 1 TABLET BY MOUTH EVERY DAY 90 tablet 1  . ALPRAZolam (XANAX) 0.25 MG tablet TAKE 1 TABLET BY MOUTH SCHEDULED EVERY MORNING & MAY REPEAT IN THE EVENING AS NEEDED FOR ANXIETY. 45 tablet 2  . amLODipine (NORVASC) 10 MG tablet TAKE 1 TABLET BY MOUTH EVERY DAY 90 tablet 2  . ascorbic Acid (VITAMIN C) 500 MG CPCR Take 500 mg by mouth daily.    . Cholecalciferol (  VITAMIN D3) 50 MCG (2000 UT) TABS Take by  mouth.    . cyanocobalamin 2000 MCG tablet Take 2,000 mcg by mouth daily.    . diphenhydrAMINE HCl, Sleep, (SLEEP-AID) 50 MG CAPS Take 50 mg by mouth at bedtime.    . gabapentin (NEURONTIN) 300 MG capsule Take 3 capsules (900 mg total) by mouth 3 (three) times daily. 240 capsule 1  . HYDROcodone-acetaminophen (NORCO) 7.5-325 MG tablet Take 1 tablet by mouth every 8 (eight) hours as needed for moderate pain. 70 tablet 0  . lisinopril (ZESTRIL) 40 MG tablet Take 1 tablet (40 mg total) by mouth daily. 90 tablet 3  . Multiple Vitamins-Minerals (MULTIVITAMIN WITH MINERALS) tablet Take 1 tablet by mouth daily. Reported on 03/21/2016    . nicotine (NICODERM CQ - DOSED IN MG/24 HOURS) 21 mg/24hr patch Place 1 patch (21 mg total) onto the skin daily. Taper as tolerated 28 patch 3  . rosuvastatin (CRESTOR) 20 MG tablet TAKE 1 TABLET BY MOUTH EVERY DAY 90 tablet 3  . tamsulosin (FLOMAX) 0.4 MG CAPS capsule Take 1 capsule (0.4 mg total) by mouth daily after breakfast. 30 capsule 0  . triamcinolone cream (KENALOG) 0.1 % Apply 1 application topically 2 (two) times daily as needed (eczema on leg). 90 g 2   No current facility-administered medications on file prior to visit.    Review of Systems:  As per HPI- otherwise negative.   Physical Examination: Vitals:   08/24/20 1335  BP: 120/82  Pulse: 87  Resp: 17  SpO2: 97%   Vitals:   08/24/20 1335  Weight: 187 lb (84.8 kg)  Height: 6' (1.829 m)   Body mass index is 25.36 kg/m. Ideal Body Weight: Weight in (lb) to have BMI = 25: 183.9  GEN: no acute distress.  Looks well, his normal self Normal weight HEENT: Atraumatic, Normocephalic.  Ears and Nose: No external deformity. CV: RRR, No M/G/R. No JVD. No thrill. No extra heart sounds. PULM: CTA B, no wheezes, crackles, rhonchi. No retractions. No resp. distress. No accessory muscle use. ABD: S, NT, ND EXTR: No c/c/e PSYCH: Normally interactive. Conversant.    Assessment and Plan: Chronic  bilateral low back pain without sciatica - Plan: HYDROcodone-acetaminophen (NORCO) 7.5-325 MG tablet  GAD (generalized anxiety disorder)  Essential hypertension - Plan: Basic metabolic panel  Chronic idiopathic gout involving toe of right foot without tophus  Pre-diabetes - Plan: Hemoglobin A1c  Dyslipidemia - Plan: Lipid panel  Screening for prostate cancer - Plan: PSA  Vasculogenic erectile dysfunction, unspecified vasculogenic erectile dysfunction type - Plan: Ambulatory referral to Urology, PSA  Benign prostatic hyperplasia, unspecified whether lower urinary tract symptoms present - Plan: PSA  Immunization due - Plan: Pneumococcal polysaccharide vaccine 23-valent greater than or equal to 2yo subcutaneous/IM, Flu Vaccine QUAD High Dose(Fluad)  Jonathan Harrington is here today for a routine follow-up visit Labs are pending as above We have tried several oral vasodilator options for his ED, none have been particularly helpful.  Referral to urology for an opinion Pneumovax and flu vaccines given today Refill pain medication which he uses for chronic lower back pain Routine labs pending today Flu vaccine given  Plan for follow-up in 6 months, he will need UDS at that time  This visit occurred during the SARS-CoV-2 public health emergency.  Safety protocols were in place, including screening questions prior to the visit, additional usage of staff PPE, and extensive cleaning of exam room while observing appropriate contact time as indicated for disinfecting solutions.  Signed Abbe Amsterdam, MD  Received his labs 9/15- message to pt Creat is above baseline- will recheck at next visit  Taking crestor-lipids better Lab Results  Component Value Date   PSA 0.44 08/24/2020   PSA 0.43 07/24/2019   PSA 0.99 06/20/2018      Results for orders placed or performed in visit on 08/24/20  Basic metabolic panel  Result Value Ref Range   Glucose, Bld 96 65 - 99 mg/dL   BUN 19 7 - 25  mg/dL   Creat 1.69 (H) 6.78 - 1.25 mg/dL   BUN/Creatinine Ratio 14 6 - 22 (calc)   Sodium 139 135 - 146 mmol/L   Potassium 5.3 3.5 - 5.3 mmol/L   Chloride 104 98 - 110 mmol/L   CO2 26 20 - 32 mmol/L   Calcium 9.3 8.6 - 10.3 mg/dL  Hemoglobin L3Y  Result Value Ref Range   Hgb A1c MFr Bld 5.3 <5.7 % of total Hgb   Mean Plasma Glucose 105 (calc)   eAG (mmol/L) 5.8 (calc)  Lipid panel  Result Value Ref Range   Cholesterol 111 <200 mg/dL   HDL 33 (L) > OR = 40 mg/dL   Triglycerides 101 (H) <150 mg/dL   LDL Cholesterol (Calc) 44 mg/dL (calc)   Total CHOL/HDL Ratio 3.4 <5.0 (calc)   Non-HDL Cholesterol (Calc) 78 <751 mg/dL (calc)  PSA  Result Value Ref Range   PSA 0.44 < OR = 4.0 ng/mL

## 2020-08-22 NOTE — Patient Instructions (Addendum)
It was great to see you again today, I will be in touch your labs.  Please see me in about 6 months and take care! Flu shot and pneumonia booster today Plan for covid booster around the holidays I will set you up with urology to discuss your concerns and see if they can help you!

## 2020-08-24 ENCOUNTER — Encounter: Payer: Self-pay | Admitting: Family Medicine

## 2020-08-24 ENCOUNTER — Other Ambulatory Visit: Payer: Self-pay

## 2020-08-24 ENCOUNTER — Ambulatory Visit (INDEPENDENT_AMBULATORY_CARE_PROVIDER_SITE_OTHER): Payer: Medicare Other | Admitting: Family Medicine

## 2020-08-24 VITALS — BP 120/82 | HR 87 | Resp 17 | Ht 72.0 in | Wt 187.0 lb

## 2020-08-24 DIAGNOSIS — Z23 Encounter for immunization: Secondary | ICD-10-CM | POA: Diagnosis not present

## 2020-08-24 DIAGNOSIS — F411 Generalized anxiety disorder: Secondary | ICD-10-CM | POA: Diagnosis not present

## 2020-08-24 DIAGNOSIS — I10 Essential (primary) hypertension: Secondary | ICD-10-CM

## 2020-08-24 DIAGNOSIS — I70213 Atherosclerosis of native arteries of extremities with intermittent claudication, bilateral legs: Secondary | ICD-10-CM | POA: Diagnosis not present

## 2020-08-24 DIAGNOSIS — M545 Low back pain, unspecified: Secondary | ICD-10-CM

## 2020-08-24 DIAGNOSIS — M1A0710 Idiopathic chronic gout, right ankle and foot, without tophus (tophi): Secondary | ICD-10-CM | POA: Diagnosis not present

## 2020-08-24 DIAGNOSIS — E785 Hyperlipidemia, unspecified: Secondary | ICD-10-CM

## 2020-08-24 DIAGNOSIS — G8929 Other chronic pain: Secondary | ICD-10-CM

## 2020-08-24 DIAGNOSIS — R7303 Prediabetes: Secondary | ICD-10-CM

## 2020-08-24 DIAGNOSIS — Z125 Encounter for screening for malignant neoplasm of prostate: Secondary | ICD-10-CM | POA: Diagnosis not present

## 2020-08-24 DIAGNOSIS — N400 Enlarged prostate without lower urinary tract symptoms: Secondary | ICD-10-CM

## 2020-08-24 DIAGNOSIS — N529 Male erectile dysfunction, unspecified: Secondary | ICD-10-CM | POA: Diagnosis not present

## 2020-08-24 MED ORDER — HYDROCODONE-ACETAMINOPHEN 7.5-325 MG PO TABS: 1.0000 | ORAL_TABLET | Freq: Three times a day (TID) | ORAL | 0 refills | Status: DC | PRN

## 2020-08-25 LAB — BASIC METABOLIC PANEL
BUN/Creatinine Ratio: 14 (calc) (ref 6–22)
BUN: 19 mg/dL (ref 7–25)
CO2: 26 mmol/L (ref 20–32)
Calcium: 9.3 mg/dL (ref 8.6–10.3)
Chloride: 104 mmol/L (ref 98–110)
Creat: 1.37 mg/dL — ABNORMAL HIGH (ref 0.70–1.25)
Glucose, Bld: 96 mg/dL (ref 65–99)
Potassium: 5.3 mmol/L (ref 3.5–5.3)
Sodium: 139 mmol/L (ref 135–146)

## 2020-08-25 LAB — LIPID PANEL
Cholesterol: 111 mg/dL (ref <200)
HDL: 33 mg/dL — ABNORMAL LOW (ref >=40)
LDL Cholesterol (Calc): 44 mg/dL (calc)
Non-HDL Cholesterol (Calc): 78 mg/dL (calc) (ref <130)
Total CHOL/HDL Ratio: 3.4 (calc) (ref <5.0)
Triglycerides: 324 mg/dL — ABNORMAL HIGH (ref <150)

## 2020-08-25 LAB — HEMOGLOBIN A1C
Hgb A1c MFr Bld: 5.3 % of total Hgb (ref <5.7)
Mean Plasma Glucose: 105 (calc)
eAG (mmol/L): 5.8 (calc)

## 2020-08-25 LAB — PSA: PSA: 0.44 ng/mL (ref <=4.0)

## 2020-08-26 ENCOUNTER — Encounter: Payer: Self-pay | Admitting: Family Medicine

## 2020-09-02 ENCOUNTER — Other Ambulatory Visit: Payer: Self-pay | Admitting: Family Medicine

## 2020-09-02 DIAGNOSIS — I10 Essential (primary) hypertension: Secondary | ICD-10-CM

## 2020-09-30 ENCOUNTER — Telehealth: Payer: Self-pay | Admitting: Family Medicine

## 2020-09-30 DIAGNOSIS — F4321 Adjustment disorder with depressed mood: Secondary | ICD-10-CM

## 2020-09-30 DIAGNOSIS — F411 Generalized anxiety disorder: Secondary | ICD-10-CM

## 2020-09-30 DIAGNOSIS — M545 Low back pain, unspecified: Secondary | ICD-10-CM

## 2020-09-30 DIAGNOSIS — G8929 Other chronic pain: Secondary | ICD-10-CM

## 2020-09-30 MED ORDER — ALPRAZOLAM 0.25 MG PO TABS: ORAL_TABLET | ORAL | 2 refills | Status: DC

## 2020-09-30 MED ORDER — HYDROCODONE-ACETAMINOPHEN 7.5-325 MG PO TABS: 1.0000 | ORAL_TABLET | Freq: Three times a day (TID) | ORAL | 0 refills | Status: DC | PRN

## 2020-09-30 NOTE — Telephone Encounter (Signed)
Last written: hydrocodone 08/24/20  Alprazolam 07/31/20 Last ov: 08/24/20 Next ov: none Contract:  UDS: 01/08/20

## 2020-09-30 NOTE — Telephone Encounter (Signed)
Medication:   HYDROcodone-acetaminophen (NORCO) 7.5-325 MG tablet [[id number]]   ALPRAZolam (XANAX) 0.25 MG tablet [[id number]]    Has the patient contacted their pharmacy? (If no, request that the patient contact the pharmacy for the refill.) (If yes, when and what did the pharmacy advise?)     Preferred Pharmacy (with phone number or street name): [id number], [place] - [address]  [address] [place] [address]  Phone:  [phone] Fax:  [phone]    Agent: Please be advised that RX refills may take up to 3 business days. We ask that you follow-up with your pharmacy.

## 2020-10-22 ENCOUNTER — Telehealth: Payer: Self-pay | Admitting: Family Medicine

## 2020-10-22 MED ORDER — GABAPENTIN 300 MG PO CAPS
900.0000 mg | ORAL_CAPSULE | Freq: Three times a day (TID) | ORAL | 1 refills | Status: DC
Start: 2020-10-22 — End: 2020-12-08

## 2020-10-22 NOTE — Telephone Encounter (Signed)
Refilled gabapentin.   Myra Rude, MD [facility] 10/22/2020, 2:30 PM

## 2020-10-22 NOTE — Telephone Encounter (Signed)
Patient called requested refill of :   gabapentin (NEURONTIN) 300 MG capsule [[id number]]   Order Details Dose: 900 mg Route: Oral Frequency: 3 times daily  Dispense Quantity: 240 capsule Refills: 1       Sig: Take 3 capsules (900 mg total) by mouth 3 (three) times daily.       Forwarding request to provider that if approved send refill order to :   Preferred Pharmacies   [id number] - [place], [place] - [address] Phone:  [phone]  Fax:  [phone]     --glh

## 2020-10-22 NOTE — Telephone Encounter (Signed)
Patient called requested refill of :   gabapentin (NEURONTIN) 300 MG capsule [[id number]]   Order Details Dose: 900 mg Route: Oral Frequency: 3 times daily  Dispense Quantity: 240 capsule Refills: 1       Sig: Take 3 capsules (900 mg total) by mouth 3 (three) times daily.       Forwarding request to provider that if approved send refill order to :   Preferred Pharmacies   [id number] [place], [place] - [address] Phone:  [phone]  Fax:  [phone]     --glh 

## 2020-11-02 ENCOUNTER — Other Ambulatory Visit: Payer: Self-pay | Admitting: Family Medicine

## 2020-11-02 DIAGNOSIS — G8929 Other chronic pain: Secondary | ICD-10-CM

## 2020-11-02 DIAGNOSIS — F4321 Adjustment disorder with depressed mood: Secondary | ICD-10-CM

## 2020-11-02 DIAGNOSIS — F411 Generalized anxiety disorder: Secondary | ICD-10-CM

## 2020-11-02 DIAGNOSIS — M545 Low back pain, unspecified: Secondary | ICD-10-CM

## 2020-11-02 MED ORDER — HYDROCODONE-ACETAMINOPHEN 7.5-325 MG PO TABS: 1.0000 | ORAL_TABLET | Freq: Three times a day (TID) | ORAL | 0 refills | Status: DC | PRN

## 2020-11-02 MED ORDER — ALPRAZOLAM 0.25 MG PO TABS: ORAL_TABLET | ORAL | 2 refills | Status: DC

## 2020-11-02 NOTE — Telephone Encounter (Signed)
Medication:ALPRAZolam (XANAX) 0.25 MG tablet [[id number]]   HYDROcodone-acetaminophen (NORCO) 7.5-325 MG tablet   Has the patient contacted their pharmacy? No. (If no, request that the patient contact the pharmacy for the refill.) (If yes, when and what did the pharmacy advise?)  Preferred Pharmacy (with phone number or street name): [id number], [place] - [address]  [address] [place] [address]  Phone:  [phone] Fax:  [phone]  DEA #:  [id number]  Agent: Please be advised that RX refills may take up to 3 business days. We ask that you follow-up with your pharmacy.

## 2020-11-02 NOTE — Telephone Encounter (Signed)
Requesting:Alprazolam and Hydrocodone Contract:none UDS:01/08/2020 Last Visit:08/24/2020 Next Visit:none Last Refill:09/30/2020  Please Advise

## 2020-11-29 ENCOUNTER — Other Ambulatory Visit: Payer: Self-pay | Admitting: Family Medicine

## 2020-11-29 DIAGNOSIS — M1000 Idiopathic gout, unspecified site: Secondary | ICD-10-CM

## 2020-11-30 DIAGNOSIS — Z23 Encounter for immunization: Secondary | ICD-10-CM | POA: Diagnosis not present

## 2020-12-07 ENCOUNTER — Encounter: Payer: Self-pay | Admitting: Family Medicine

## 2020-12-07 ENCOUNTER — Other Ambulatory Visit: Payer: Self-pay | Admitting: Family Medicine

## 2020-12-07 DIAGNOSIS — M545 Low back pain, unspecified: Secondary | ICD-10-CM

## 2020-12-07 DIAGNOSIS — G8929 Other chronic pain: Secondary | ICD-10-CM

## 2020-12-07 DIAGNOSIS — F4321 Adjustment disorder with depressed mood: Secondary | ICD-10-CM

## 2020-12-07 DIAGNOSIS — F411 Generalized anxiety disorder: Secondary | ICD-10-CM

## 2020-12-07 MED ORDER — HYDROCODONE-ACETAMINOPHEN 7.5-325 MG PO TABS: 1.0000 | ORAL_TABLET | Freq: Three times a day (TID) | ORAL | 0 refills | Status: DC | PRN

## 2020-12-07 NOTE — Telephone Encounter (Signed)
Requesting:Alprazolam and Hydrocodone Contract:none UDS:01/08/2020 Last Visit:08/24/2020 Next Visit:none scheduled Last Refill:11/02/2020  Please Advise

## 2020-12-07 NOTE — Telephone Encounter (Signed)
Medication:  ALPRAZolam (XANAX) 0.25 MG tablet [[id number]   HYDROcodone-acetaminophen (NORCO) 7.5-325 MG tablet [[id number]]    Has the patient contacted their pharmacy?  (If no, request that the patient contact the pharmacy for the refill.) (If yes, when and what did the pharmacy advise?)     Preferred Pharmacy (with phone number or street name): [id number] [place], [place] - [address]  [address] [place] [address]  Phone:  [phone] Fax:  [phone]  DEA #:  [id number]     Agent: Please be advised that RX refills may take up to 3 business days. We ask that you follow-up with your pharmacy.

## 2020-12-08 ENCOUNTER — Telehealth: Payer: Self-pay | Admitting: Family Medicine

## 2020-12-08 MED ORDER — GABAPENTIN 300 MG PO CAPS: 900.0000 mg | ORAL_CAPSULE | Freq: Three times a day (TID) | ORAL | 1 refills | Status: DC

## 2020-12-08 NOTE — Telephone Encounter (Signed)
Provided refill.   Myra Rude, MD [facility] 12/08/2020, 6:09 PM

## 2020-12-08 NOTE — Telephone Encounter (Signed)
Patient called request refill of:  ( ask why can't he get a (70month supply ??)  gabapentin (NEURONTIN) 300 MG capsule [[id number]]   Order Details Dose: 900 mg Route: Oral Frequency: 3 times daily  Dispense Quantity: 240 capsule Refills: 1       Sig: Take 3 capsules (900 mg total) by mouth 3 (three) times daily.   ---Forwarding request to provider that if approved send refill order to :   [id number] - [place], [place] - [address] Phone:  [phone]  Fax:  [phone]      --Please contact pt if there are any questions or concerns   --glh

## 2020-12-17 ENCOUNTER — Ambulatory Visit (INDEPENDENT_AMBULATORY_CARE_PROVIDER_SITE_OTHER): Payer: Medicare HMO | Admitting: Family Medicine

## 2020-12-17 ENCOUNTER — Ambulatory Visit (HOSPITAL_BASED_OUTPATIENT_CLINIC_OR_DEPARTMENT_OTHER)
Admission: RE | Admit: 2020-12-17 | Discharge: 2020-12-17 | Disposition: A | Discharge status: Home / Self Care | Payer: Medicare HMO | Source: Ambulatory Visit | Attending: Family Medicine | Admitting: Family Medicine

## 2020-12-17 ENCOUNTER — Telehealth: Payer: Self-pay

## 2020-12-17 ENCOUNTER — Other Ambulatory Visit: Payer: Self-pay

## 2020-12-17 VITALS — BP 118/52 | HR 87 | Temp 99.2°F | Ht 72.0 in | Wt 177.0 lb

## 2020-12-17 DIAGNOSIS — J441 Chronic obstructive pulmonary disease with (acute) exacerbation: Secondary | ICD-10-CM

## 2020-12-17 DIAGNOSIS — R0781 Pleurodynia: Secondary | ICD-10-CM

## 2020-12-17 DIAGNOSIS — R059 Cough, unspecified: Secondary | ICD-10-CM | POA: Diagnosis not present

## 2020-12-17 DIAGNOSIS — S2242XA Multiple fractures of ribs, left side, initial encounter for closed fracture: Secondary | ICD-10-CM | POA: Diagnosis not present

## 2020-12-17 MED ORDER — PREDNISONE 10 MG PO TABS: ORAL_TABLET | ORAL | 0 refills | Status: DC

## 2020-12-17 MED ORDER — AZITHROMYCIN 250 MG PO TABS: ORAL_TABLET | ORAL | 0 refills | Status: DC

## 2020-12-17 MED ORDER — PROMETHAZINE-DM 6.25-15 MG/5ML PO SYRP: 5.0000 mL | ORAL_SOLUTION | Freq: Four times a day (QID) | ORAL | 0 refills | Status: DC | PRN

## 2020-12-17 MED ORDER — METHYLPREDNISOLONE ACETATE 80 MG/ML IJ SUSP
80.0000 mg | Freq: Once | INTRAMUSCULAR | Status: AC
  Administered 2020-12-17: 80 mg via INTRAMUSCULAR

## 2020-12-17 NOTE — Patient Instructions (Signed)
Chronic Obstructive Pulmonary Disease Chronic obstructive pulmonary disease (COPD) is a long-term (chronic) lung problem. When you have COPD, it is hard for air to get in and out of your lungs. Usually the condition gets worse over time, and your lungs will never return to normal. There are things you can do to keep yourself as healthy as possible.  Your doctor may treat your condition with: ? Medicines. ? Oxygen. ? Lung surgery.  Your doctor may also recommend: ? Rehabilitation. This includes steps to make your body work better. It may involve a team of specialists. ? Quitting smoking, if you smoke. ? Exercise and changes to your diet. ? Comfort measures (palliative care). Follow these instructions at home: Medicines  Take over-the-counter and prescription medicines only as told by your doctor.  Talk to your doctor before taking any cough or allergy medicines. You may need to avoid medicines that cause your lungs to be dry. Lifestyle  If you smoke, stop. Smoking makes the problem worse. If you need help quitting, ask your doctor.  Avoid being around things that make your breathing worse. This may include smoke, chemicals, and fumes.  Stay active, but remember to rest as well.  Learn and use tips on how to relax.  Make sure you get enough sleep. Most adults need at least 7 hours of sleep every night.  Eat healthy foods. Eat smaller meals more often. Rest before meals. Controlled breathing Learn and use tips on how to control your breathing as told by your doctor. Try:  Breathing in (inhaling) through your nose for 1 second. Then, pucker your lips and breath out (exhale) through your lips for 2 seconds.  Putting one hand on your belly (abdomen). Breathe in slowly through your nose for 1 second. Your hand on your belly should move out. Pucker your lips and breathe out slowly through your lips. Your hand on your belly should move in as you breathe out.  Controlled coughing Learn  and use controlled coughing to clear mucus from your lungs. Follow these steps: 1. Lean your head a little forward. 2. Breathe in deeply. 3. Try to hold your breath for 3 seconds. 4. Keep your mouth slightly open while coughing 2 times. 5. Spit any mucus out into a tissue. 6. Rest and do the steps again 1 or 2 times as needed. General instructions  Make sure you get all the shots (vaccines) that your doctor recommends. Ask your doctor about a flu shot and a pneumonia shot.  Use oxygen therapy and pulmonary rehabilitation if told by your doctor. If you need home oxygen therapy, ask your doctor if you should buy a tool to measure your oxygen level (oximeter).  Make a COPD action plan with your doctor. This helps you to know what to do if you feel worse than usual.  Manage any other conditions you have as told by your doctor.  Avoid going outside when it is very hot, cold, or humid.  Avoid people who have a sickness you can catch (contagious).  Keep all follow-up visits as told by your doctor. This is important. Contact a doctor if:  You cough up more mucus than usual.  There is a change in the color or thickness of the mucus.  It is harder to breathe than usual.  Your breathing is faster than usual.  You have trouble sleeping.  You need to use your medicines more often than usual.  You have trouble doing your normal activities such as getting dressed   or walking around the house. Get help right away if:  You have shortness of breath while resting.  You have shortness of breath that stops you from: ? Being able to talk. ? Doing normal activities.  Your chest hurts for longer than 5 minutes.  Your skin color is more blue than usual.  Your pulse oximeter shows that you have low oxygen for longer than 5 minutes.  You have a fever.  You feel too tired to breathe normally. Summary  Chronic obstructive pulmonary disease (COPD) is a long-term lung problem.  The way your  lungs work will never return to normal. Usually the condition gets worse over time. There are things you can do to keep yourself as healthy as possible.  Take over-the-counter and prescription medicines only as told by your doctor.  If you smoke, stop. Smoking makes the problem worse. This information is not intended to replace advice given to you by your health care provider. Make sure you discuss any questions you have with your health care provider. Document Revised: 11/10/2017 Document Reviewed: 01/02/2017 Elsevier Patient Education  2020 Elsevier Inc.  

## 2020-12-17 NOTE — Telephone Encounter (Signed)
Nurse Assessment Nurse: Anner Crete, RN, Olegario Messier Date/Time (Eastern Time): 12/17/2020 11:44:18 AM Confirm and document reason for call. If symptomatic, describe symptoms. ---Caller stated he fell and hit his ribs a week ago and now having chest pain and coughing. Does the patient have any new or worsening symptoms? ---Yes Will a triage be completed? ---Yes Related visit to physician within the last 2 weeks? ---No Does the PT have any chronic conditions? (i.e. diabetes, asthma, this includes High risk factors for pregnancy, etc.) ---Yes List chronic conditions. ---COPD and HTN Is this a behavioral health or substance abuse call? ---No Guidelines Guideline Title Affirmed Question Affirmed Notes Nurse Date/Time (Eastern Time) Chest Injury [1] High-risk adult (e.g., age > 60 years, osteoporosis, chronic steroid use) AND [2] still hurts Wells, RN, Olegario Messier 12/17/2020 11:44:52 AM Disp. Time Lamount Cohen Time) Disposition Final User 12/17/2020 11:38:16 AM Send to Urgent Queue Jory Ee 12/17/2020 11:47:32 AM See PCP within 24 Hours Yes Anner Crete, RN, Olegario Messier PLEASE NOTE: All timestamps contained within this report are represented as [place] Standard Time. CONFIDENTIALTY NOTICE: This fax transmission is intended only for the addressee. It contains information that is legally privileged, confidential or otherwise protected from use or disclosure. If you are not the intended recipient, you are strictly prohibited from reviewing, disclosing, copying using or disseminating any of this information or taking any action in reliance on or regarding this information. If you have received this fax in error, please notify us immediately by telephone so that we can arrange for its return to [place]. Phone: [phone], Toll-Free: [phone], Fax: [phone] Page: 2 of 2 Call Id: [id number] Caller Disagree/Comply Comply Caller Understands Yes PreDisposition Call Doctor Care Advice Given Per Guideline SEE PCP WITHIN 24 HOURS:  * IF OFFICE WILL BE OPEN: You need to be examined within the next 24 hours. Call your doctor (or NP/PA) when the office opens and make an appointment. CALL BACK IF: * You become worse CARE ADVICE given per Chest Injury (Adult) guideline. Referrals REFERRED TO PCP OFFICE  Pt seen today w/ Dr. Laury Axon.

## 2020-12-18 ENCOUNTER — Other Ambulatory Visit: Payer: Self-pay

## 2020-12-18 ENCOUNTER — Other Ambulatory Visit: Payer: Self-pay | Admitting: Family Medicine

## 2020-12-18 ENCOUNTER — Telehealth: Payer: Self-pay | Admitting: Family Medicine

## 2020-12-18 DIAGNOSIS — E875 Hyperkalemia: Secondary | ICD-10-CM

## 2020-12-18 DIAGNOSIS — I10 Essential (primary) hypertension: Secondary | ICD-10-CM

## 2020-12-18 DIAGNOSIS — M1000 Idiopathic gout, unspecified site: Secondary | ICD-10-CM

## 2020-12-18 DIAGNOSIS — E785 Hyperlipidemia, unspecified: Secondary | ICD-10-CM

## 2020-12-18 LAB — CBC WITH DIFFERENTIAL/PLATELET
Basophils Absolute: 0 10*3/uL (ref 0.0–0.1)
Basophils Relative: 0.5 % (ref 0.0–3.0)
Eosinophils Absolute: 0.1 10*3/uL (ref 0.0–0.7)
Eosinophils Relative: 2.8 % (ref 0.0–5.0)
HCT: 37.6 % — ABNORMAL LOW (ref 39.0–52.0)
Hemoglobin: 12.7 g/dL — ABNORMAL LOW (ref 13.0–17.0)
Lymphocytes Relative: 34.5 % (ref 12.0–46.0)
Lymphs Abs: 1.9 10*3/uL (ref 0.7–4.0)
MCHC: 33.9 g/dL (ref 30.0–36.0)
MCV: 105.2 fl — ABNORMAL HIGH (ref 78.0–100.0)
Monocytes Absolute: 0.5 10*3/uL (ref 0.1–1.0)
Monocytes Relative: 9.1 % (ref 3.0–12.0)
Neutro Abs: 2.9 10*3/uL (ref 1.4–7.7)
Neutrophils Relative %: 53.1 % (ref 43.0–77.0)
Platelets: 206 10*3/uL (ref 150.0–400.0)
RBC: 3.57 Mil/uL — ABNORMAL LOW (ref 4.22–5.81)
RDW: 14.2 % (ref 11.5–15.5)
WBC: 5.4 10*3/uL (ref 4.0–10.5)

## 2020-12-18 LAB — COMPREHENSIVE METABOLIC PANEL
ALT: 9 U/L (ref 0–53)
AST: 24 U/L (ref 0–37)
Albumin: 4.4 g/dL (ref 3.5–5.2)
Alkaline Phosphatase: 105 U/L (ref 39–117)
BUN: 22 mg/dL (ref 6–23)
CO2: 24 mEq/L (ref 19–32)
Calcium: 9.1 mg/dL (ref 8.4–10.5)
Chloride: 105 mEq/L (ref 96–112)
Creatinine, Ser: 1.49 mg/dL (ref 0.40–1.50)
GFR: 48.51 mL/min — ABNORMAL LOW (ref >60.00)
Glucose, Bld: 100 mg/dL — ABNORMAL HIGH (ref 70–99)
Potassium: 5.5 mEq/L — ABNORMAL HIGH (ref 3.5–5.1)
Sodium: 136 mEq/L (ref 135–145)
Total Bilirubin: 0.5 mg/dL (ref 0.2–1.2)
Total Protein: 7.4 g/dL (ref 6.0–8.3)

## 2020-12-18 NOTE — Telephone Encounter (Signed)
Patient states he needs all his maintenance medication to be send to   [facility] [place], [place] - [address] Phone:  [phone]  Fax:  [phone]

## 2020-12-20 ENCOUNTER — Encounter: Payer: Self-pay | Admitting: Family Medicine

## 2020-12-20 DIAGNOSIS — R059 Cough, unspecified: Secondary | ICD-10-CM | POA: Insufficient documentation

## 2020-12-20 DIAGNOSIS — R0781 Pleurodynia: Secondary | ICD-10-CM | POA: Insufficient documentation

## 2020-12-20 DIAGNOSIS — J441 Chronic obstructive pulmonary disease with (acute) exacerbation: Secondary | ICD-10-CM | POA: Insufficient documentation

## 2020-12-20 DIAGNOSIS — J449 Chronic obstructive pulmonary disease, unspecified: Secondary | ICD-10-CM | POA: Insufficient documentation

## 2020-12-20 NOTE — Progress Notes (Signed)
Patient ID: Jonathan Harrington, male    DOB: 15-Mar-1956  Age: 65 y.o. MRN: [id number]    Subjective:  Subjective  HPI Jonathan Harrington presents for rib pain--- pt fell at home and feels like Jonathan Harrington has fractured ribs-- since Jonathan Harrington has started coughing up phlegm and has pain with coughing --Jonathan Harrington has a hx copd No fevers  No covid test-- + covid  vaccine  Review of Systems  Constitutional: Negative.   HENT: Negative for congestion, ear pain, hearing loss, nosebleeds, postnasal drip, rhinorrhea, sinus pressure, sneezing and tinnitus.   Eyes: Negative for photophobia, discharge, itching and visual disturbance.  Respiratory: Positive for cough, chest tightness and shortness of breath. Negative for wheezing.   Cardiovascular: Positive for chest pain.  Gastrointestinal: Negative for abdominal distention, abdominal pain, anal bleeding, blood in stool and constipation.  Endocrine: Negative.   Genitourinary: Negative.   Musculoskeletal: Negative.   Skin: Negative.   Allergic/Immunologic: Negative.   Neurological: Negative for dizziness, weakness, light-headedness, numbness and headaches.  Psychiatric/Behavioral: Negative for agitation, confusion, decreased concentration, dysphoric mood, sleep disturbance and suicidal ideas. The patient is not nervous/anxious.     History Past Medical History:  Diagnosis Date  . Alcoholism (HCC)   . Allergy   . Anxiety   . COPD (chronic obstructive pulmonary disease) (HCC)   . Diverticulitis 2011   resulted in partial colectomy  . Essential hypertension 06/06/2016  . Full dentures   . Gout   . Hematuria   . Wears glasses     Jonathan Harrington has a past surgical history that includes Colon surgery (2011); Colonoscopy; Finger arthroplasty (1990); Inguinal hernia repair (Left, 05/27/2014); Insertion of mesh (Left, 05/27/2014); Hernia repair; and ABDOMINAL AORTOGRAM W/LOWER EXTREMITY (Bilateral, 07/19/2019).   His family history includes Alzheimer's disease in his mother; Cancer in his  sister.Jonathan Harrington reports that Jonathan Harrington has been smoking. Jonathan Harrington has been smoking about 2.00 packs per day. Jonathan Harrington has never used smokeless tobacco. Jonathan Harrington reports current alcohol use of about 21.0 standard drinks of alcohol per week. Jonathan Harrington reports that Jonathan Harrington does not use drugs.  Current Outpatient Medications on File Prior to Visit  Medication Sig Dispense Refill  . albuterol (VENTOLIN HFA) 108 (90 Base) MCG/ACT inhaler Inhale 2 puffs into the lungs every 6 (six) hours as needed for wheezing or shortness of breath. 1 Inhaler 6  . allopurinol (ZYLOPRIM) 300 MG tablet Take 1 tablet (300 mg total) by mouth daily. 90 tablet 1  . ALPRAZolam (XANAX) 0.25 MG tablet TAKE 1 TABLET BY MOUTH SCHEDULED EVERY MORNING & MAY REPEAT IN THE EVENING AS NEEDED FOR ANXIETY. 45 tablet 2  . amLODipine (NORVASC) 10 MG tablet TAKE 1 TABLET BY MOUTH EVERY DAY 90 tablet 2  . ascorbic Acid (VITAMIN C) 500 MG CPCR Take 500 mg by mouth daily.    . Cholecalciferol (VITAMIN D3) 50 MCG (2000 UT) TABS Take by mouth.    . cyanocobalamin 2000 MCG tablet Take 2,000 mcg by mouth daily.    . diphenhydrAMINE HCl, Sleep, (SLEEP-AID) 50 MG CAPS Take 50 mg by mouth at bedtime.    . gabapentin (NEURONTIN) 300 MG capsule Take 3 capsules (900 mg total) by mouth 3 (three) times daily. 240 capsule 1  . HYDROcodone-acetaminophen (NORCO) 7.5-325 MG tablet Take 1 tablet by mouth every 8 (eight) hours as needed for moderate pain. 70 tablet 0  . lisinopril (ZESTRIL) 40 MG tablet TAKE 1 TABLET BY MOUTH EVERY DAY 90 tablet 3  . Multiple Vitamins-Minerals (MULTIVITAMIN WITH MINERALS) tablet Take  1 tablet by mouth daily. Reported on 03/21/2016    . nicotine (NICODERM CQ - DOSED IN MG/24 HOURS) 21 mg/24hr patch Place 1 patch (21 mg total) onto the skin daily. Taper as tolerated 28 patch 3  . rosuvastatin (CRESTOR) 20 MG tablet TAKE 1 TABLET BY MOUTH EVERY DAY 90 tablet 3  . triamcinolone cream (KENALOG) 0.1 % Apply 1 application topically 2 (two) times daily as needed (eczema on leg).  90 g 2   No current facility-administered medications on file prior to visit.     Objective:  Objective  Physical Exam Vitals and nursing note reviewed.  Constitutional:      General: Jonathan Harrington is sleeping. Vital signs are normal.     Appearance: Jonathan Harrington is well-developed and well-nourished.  HENT:     Head: Normocephalic and atraumatic.     Mouth/Throat:     Mouth: Oropharynx is clear and moist.  Eyes:     Extraocular Movements: EOM normal.     Pupils: Pupils are equal, round, and reactive to light.  Neck:     Thyroid: No thyromegaly.  Cardiovascular:     Rate and Rhythm: Normal rate and regular rhythm.     Heart sounds: No murmur heard.   Pulmonary:     Effort: Pulmonary effort is normal. No respiratory distress.     Breath sounds: Normal breath sounds. No wheezing or rales.  Chest:     Chest wall: Tenderness present.  Abdominal:     General: Abdomen is flat.     Palpations: Abdomen is soft.  Musculoskeletal:        General: No tenderness or edema.     Cervical back: Normal range of motion and neck supple.  Skin:    General: Skin is warm and dry.     Findings: No bruising or lesion.  Neurological:     Mental Status: Jonathan Harrington is oriented to person, place, and time.  Psychiatric:        Mood and Affect: Mood and affect normal.        Behavior: Behavior normal.        Thought Content: Thought content normal.        Judgment: Judgment normal.    BP (!) 118/52 (BP Location: Right Arm, Patient Position: Sitting, Cuff Size: Large)   Pulse 87   Temp 99.2 F (37.3 C) (Oral)   Ht 6' (1.829 m)   Wt 177 lb (80.3 kg)   SpO2 95%   BMI 24.01 kg/m  Wt Readings from Last 3 Encounters:  12/17/20 177 lb (80.3 kg)  08/24/20 187 lb (84.8 kg)  05/26/20 193 lb 8 oz (87.8 kg)     Lab Results  Component Value Date   WBC 5.4 12/17/2020   HGB 12.7 (L) 12/17/2020   HCT 37.6 (L) 12/17/2020   PLT 206.0 12/17/2020   GLUCOSE 100 (H) 12/17/2020   CHOL 111 08/24/2020   TRIG 324 (H) 08/24/2020    HDL 33 (L) 08/24/2020   LDLDIRECT 50.0 07/24/2019   LDLCALC 44 08/24/2020   ALT 9 12/17/2020   AST 24 12/17/2020   NA 136 12/17/2020   K 5.5 No hemolysis seen (H) 12/17/2020   CL 105 12/17/2020   CREATININE 1.49 12/17/2020   BUN 22 12/17/2020   CO2 24 12/17/2020   TSH 1.88 01/08/2020   PSA 0.44 08/24/2020   INR 1.03 11/17/2010   HGBA1C 5.3 08/24/2020    DG Chest 2 View  Result Date: 12/17/2020 CLINICAL DATA:  Left  rib pain after fall. EXAM: CHEST - 2 VIEW COMPARISON:  January 08, 2019. FINDINGS: The heart size and mediastinal contours are within normal limits. Both lungs are clear. No pneumothorax or pleural effusion is noted. Mildly displaced left sixth and seventh rib fractures are noted. IMPRESSION: Mildly displaced left sixth and seventh rib fractures. Electronically Signed   By: Lupita Raider M.D.   On: 12/17/2020 16:47   DG Ribs Unilateral Left  Result Date: 12/17/2020 CLINICAL DATA:  Left rib pain after fall. EXAM: LEFT RIBS - 2 VIEW COMPARISON:  None. FINDINGS: Mildly displaced fractures are seen involving the lateral portions of the left fourth, fifth, 6 and seventh ribs. IMPRESSION: Mildly displaced left fourth through seventh rib fractures. Electronically Signed   By: Lupita Raider M.D.   On: 12/17/2020 16:49     Assessment & Plan:  Plan  I am having Jonathan Harrington. Jonathan Harrington start on azithromycin, predniSONE, and promethazine-dextromethorphan. I am also having him maintain his multivitamin with minerals, cyanocobalamin, albuterol, Sleep-Aid, triamcinolone, nicotine, amLODipine, rosuvastatin, ascorbic Acid, Vitamin D3, lisinopril, ALPRAZolam, allopurinol, HYDROcodone-acetaminophen, and gabapentin. We administered methylPREDNISolone acetate.  Meds ordered this encounter  Medications  . azithromycin (ZITHROMAX Z-PAK) 250 MG tablet    Sig: As directed    Dispense:  6 each    Refill:  0  . predniSONE (DELTASONE) 10 MG tablet    Sig: TAKE 3 TABLETS PO QD FOR 3 DAYS THEN TAKE 2  TABLETS PO QD FOR 3 DAYS THEN TAKE 1 TABLET PO QD FOR 3 DAYS THEN TAKE 1/2 TAB PO QD FOR 3 DAYS    Dispense:  20 tablet    Refill:  0  . promethazine-dextromethorphan (PROMETHAZINE-DM) 6.25-15 MG/5ML syrup    Sig: Take 5 mLs by mouth 4 (four) times daily as needed.    Dispense:  118 mL    Refill:  0  . methylPREDNISolone acetate (DEPO-MEDROL) injection 80 mg    Problem List Items Addressed This Visit      Unprioritized   Acute exacerbation of chronic obstructive pulmonary disease (COPD) (HCC)   Relevant Medications   azithromycin (ZITHROMAX Z-PAK) 250 MG tablet   predniSONE (DELTASONE) 10 MG tablet   promethazine-dextromethorphan (PROMETHAZINE-DM) 6.25-15 MG/5ML syrup   Cough - Primary   Relevant Medications   azithromycin (ZITHROMAX Z-PAK) 250 MG tablet   predniSONE (DELTASONE) 10 MG tablet   promethazine-dextromethorphan (PROMETHAZINE-DM) 6.25-15 MG/5ML syrup   Other Relevant Orders   CBC with Differential/Platelet (Completed)   Comprehensive metabolic panel (Completed)   DG Chest 2 View (Completed)   DG Ribs Unilateral Left (Completed)   Rib pain    S/p fall Check cxr           Follow-up: Return in about 2 weeks (around 12/31/2020), or if symptoms worsen or fail to improve, for f/u [place] .  Donato Schultz, DO

## 2020-12-20 NOTE — Assessment & Plan Note (Signed)
S/p fall Check cxr

## 2020-12-21 ENCOUNTER — Other Ambulatory Visit (INDEPENDENT_AMBULATORY_CARE_PROVIDER_SITE_OTHER): Payer: Medicare HMO

## 2020-12-21 ENCOUNTER — Other Ambulatory Visit: Payer: Self-pay

## 2020-12-21 DIAGNOSIS — E875 Hyperkalemia: Secondary | ICD-10-CM | POA: Diagnosis not present

## 2020-12-21 LAB — BASIC METABOLIC PANEL
BUN: 29 mg/dL — ABNORMAL HIGH (ref 6–23)
CO2: 25 mEq/L (ref 19–32)
Calcium: 9.4 mg/dL (ref 8.4–10.5)
Chloride: 104 mEq/L (ref 96–112)
Creatinine, Ser: 1.63 mg/dL — ABNORMAL HIGH (ref 0.40–1.50)
GFR: 43.56 mL/min — ABNORMAL LOW (ref >60.00)
Glucose, Bld: 116 mg/dL — ABNORMAL HIGH (ref 70–99)
Potassium: 5.6 mEq/L — ABNORMAL HIGH (ref 3.5–5.1)
Sodium: 137 mEq/L (ref 135–145)

## 2020-12-21 MED ORDER — ALLOPURINOL 300 MG PO TABS: 300.0000 mg | ORAL_TABLET | Freq: Every day | ORAL | 1 refills | Status: DC

## 2020-12-21 MED ORDER — LISINOPRIL 40 MG PO TABS: 40.0000 mg | ORAL_TABLET | Freq: Every day | ORAL | 3 refills | Status: DC

## 2020-12-21 MED ORDER — AMLODIPINE BESYLATE 10 MG PO TABS: 10.0000 mg | ORAL_TABLET | Freq: Every day | ORAL | 2 refills | Status: DC

## 2020-12-21 MED ORDER — ROSUVASTATIN CALCIUM 20 MG PO TABS: 20.0000 mg | ORAL_TABLET | Freq: Every day | ORAL | 3 refills | Status: DC

## 2020-12-21 NOTE — Telephone Encounter (Signed)
Medications sent to pharmacy

## 2020-12-31 ENCOUNTER — Other Ambulatory Visit: Payer: Self-pay | Admitting: Family Medicine

## 2020-12-31 DIAGNOSIS — F411 Generalized anxiety disorder: Secondary | ICD-10-CM

## 2020-12-31 DIAGNOSIS — F4321 Adjustment disorder with depressed mood: Secondary | ICD-10-CM

## 2020-12-31 DIAGNOSIS — G8929 Other chronic pain: Secondary | ICD-10-CM

## 2020-12-31 MED ORDER — ALPRAZOLAM 0.25 MG PO TABS
ORAL_TABLET | ORAL | 2 refills | Status: DC
Start: 2020-12-31 — End: 2021-03-23

## 2020-12-31 MED ORDER — HYDROCODONE-ACETAMINOPHEN 7.5-325 MG PO TABS: 1.0000 | ORAL_TABLET | Freq: Three times a day (TID) | ORAL | 0 refills | Status: DC | PRN

## 2020-12-31 NOTE — Telephone Encounter (Signed)
Medication: HYDROcodone-acetaminophen (NORCO) 7.5-325 MG tablet    ALPRAZolam (XANAX) 0.25 MG tablet    Has the patient contacted their pharmacy? No. (If no, request that the patient contact the pharmacy for the refill.) (If yes, when and what did the pharmacy advise?)  Preferred Pharmacy (with phone number or street name):  [facility] - [place], [place] - [address] Phone:  [phone]  Fax:  [phone]       Agent: Please be advised that RX refills may take up to 3 business days. We ask that you follow-up with your pharmacy.

## 2021-01-01 ENCOUNTER — Telehealth: Payer: Self-pay

## 2021-01-01 NOTE — Telephone Encounter (Signed)
PA approved. Effective through 12/11/2021.

## 2021-01-01 NOTE — Telephone Encounter (Signed)
PA initiated via Covermymeds; KEY: [id number]. Awaiting determination.

## 2021-01-04 ENCOUNTER — Telehealth: Payer: Self-pay | Admitting: Family Medicine

## 2021-01-04 MED ORDER — GABAPENTIN 300 MG PO CAPS
900.0000 mg | ORAL_CAPSULE | Freq: Three times a day (TID) | ORAL | 1 refills | Status: DC
Start: 2021-01-04 — End: 2021-01-13

## 2021-01-04 NOTE — Telephone Encounter (Signed)
[facility]ld [facility] & needs his Rx for :    gabapentin (NEURONTIN) 300 MG capsule [[id number]]   Order Details Dose: 900 mg Route: Oral Frequency: 3 times daily  Dispense Quantity: 240 capsule Refills: 1       Sig: Take 3 capsules (900 mg total) by mouth 3 (three) times daily.        ----- Rhina Brackett to :  Preferred [facility] - [place], [place] - [address] Phone:  [phone]  Fax:  [phone]     --Forwarding request to provider.  --glh

## 2021-01-04 NOTE — Telephone Encounter (Signed)
Patient is called about his  HYDROcodone-acetaminophen (NORCO) 7.5-325 MG tablet [[id number]]  Pt states [employer] mail order did not receive it.

## 2021-01-06 ENCOUNTER — Telehealth: Payer: Self-pay | Admitting: Family Medicine

## 2021-01-06 NOTE — Telephone Encounter (Signed)
Patient call to follow up on med

## 2021-01-06 NOTE — Telephone Encounter (Signed)
Which medication is he following up on? Needing more details.

## 2021-01-07 NOTE — Telephone Encounter (Signed)
HYDROcodone-acetaminophen (NORCO) 7.5-325 MG tablet [[id number]]

## 2021-01-11 IMAGING — DX DG CHEST 2V
2 series · 2 of 2 positions shown · non-contrast
Comparison: January 08, 2019.

CLINICAL DATA: Left rib pain after fall.

EXAM:
CHEST - 2 VIEW

[chest pa]
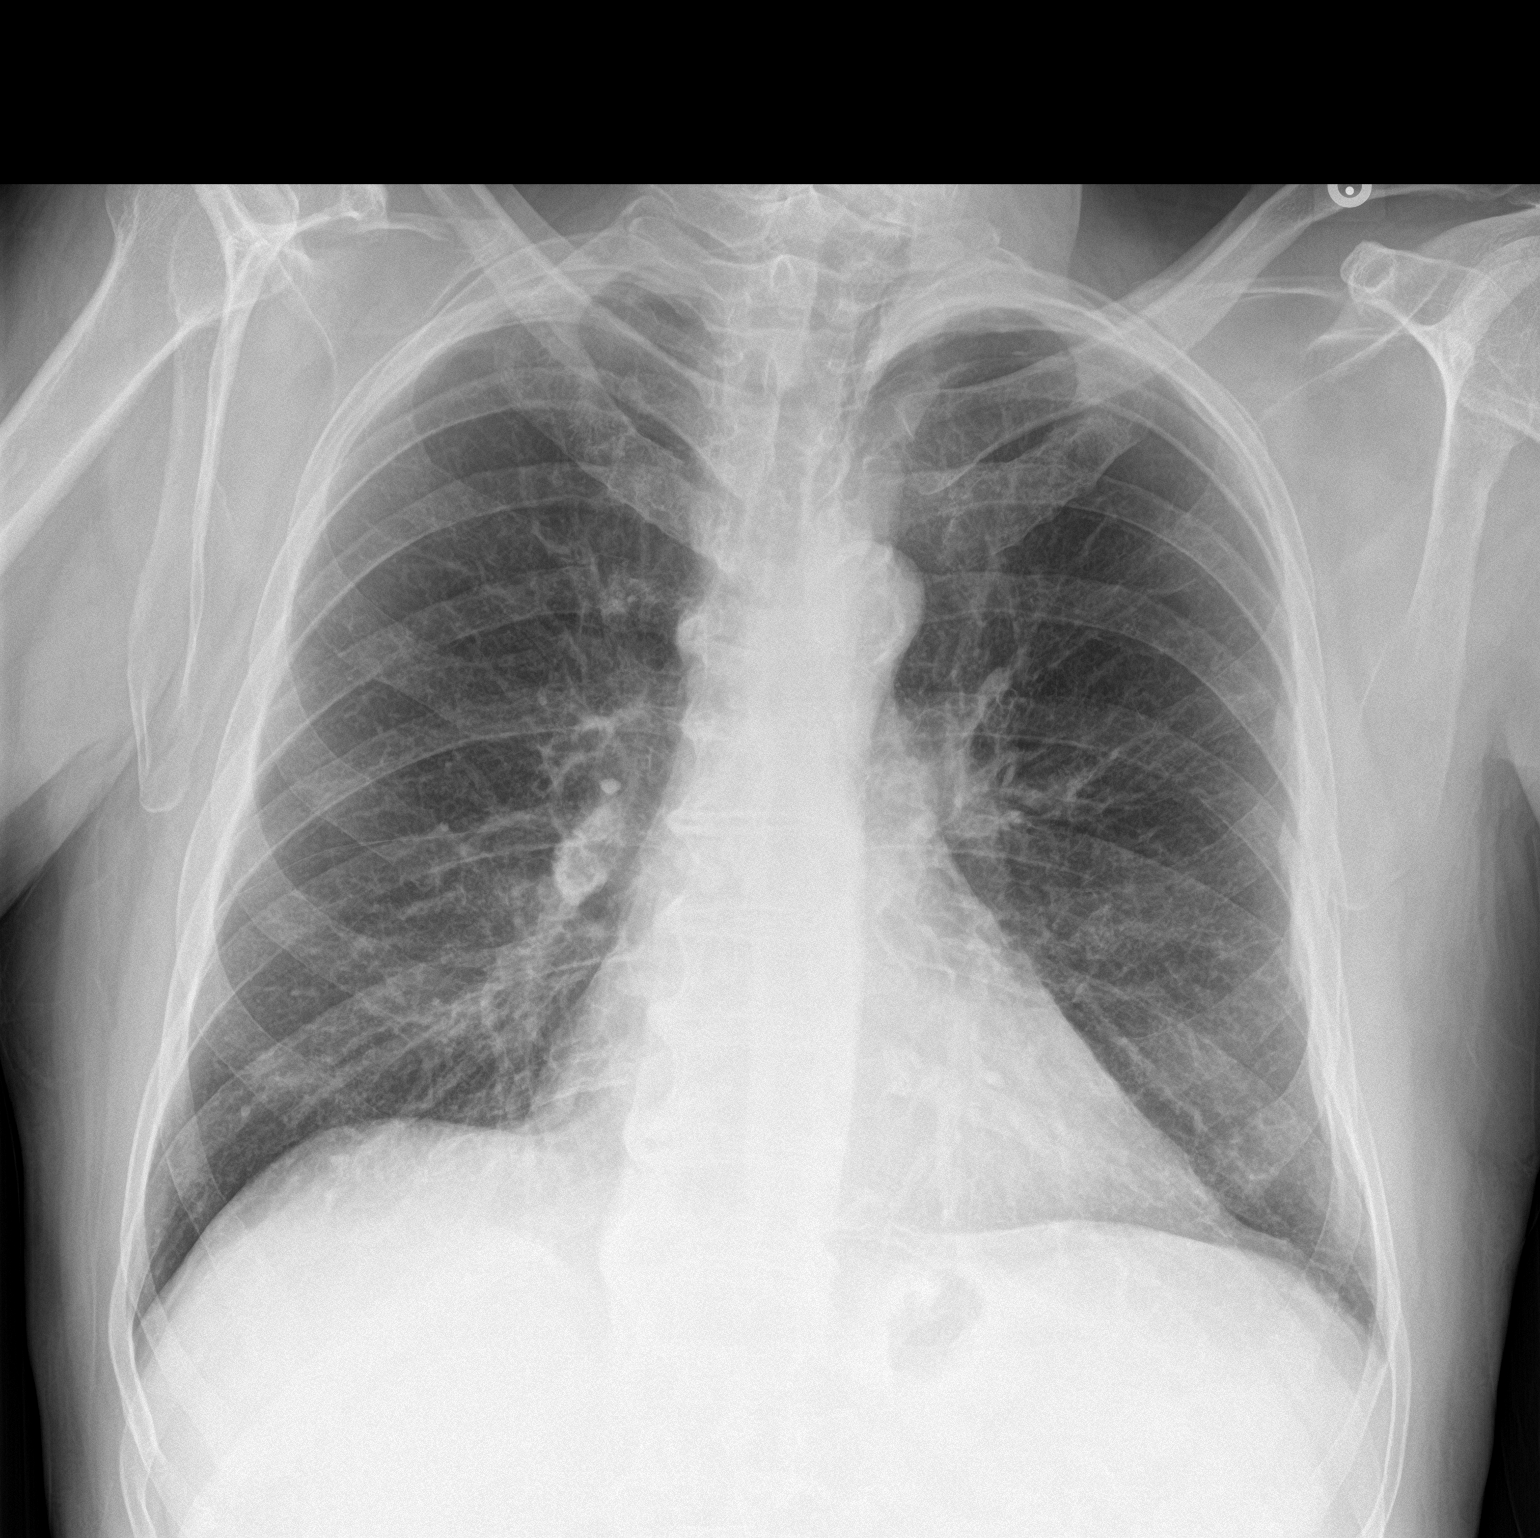

[chest lat]
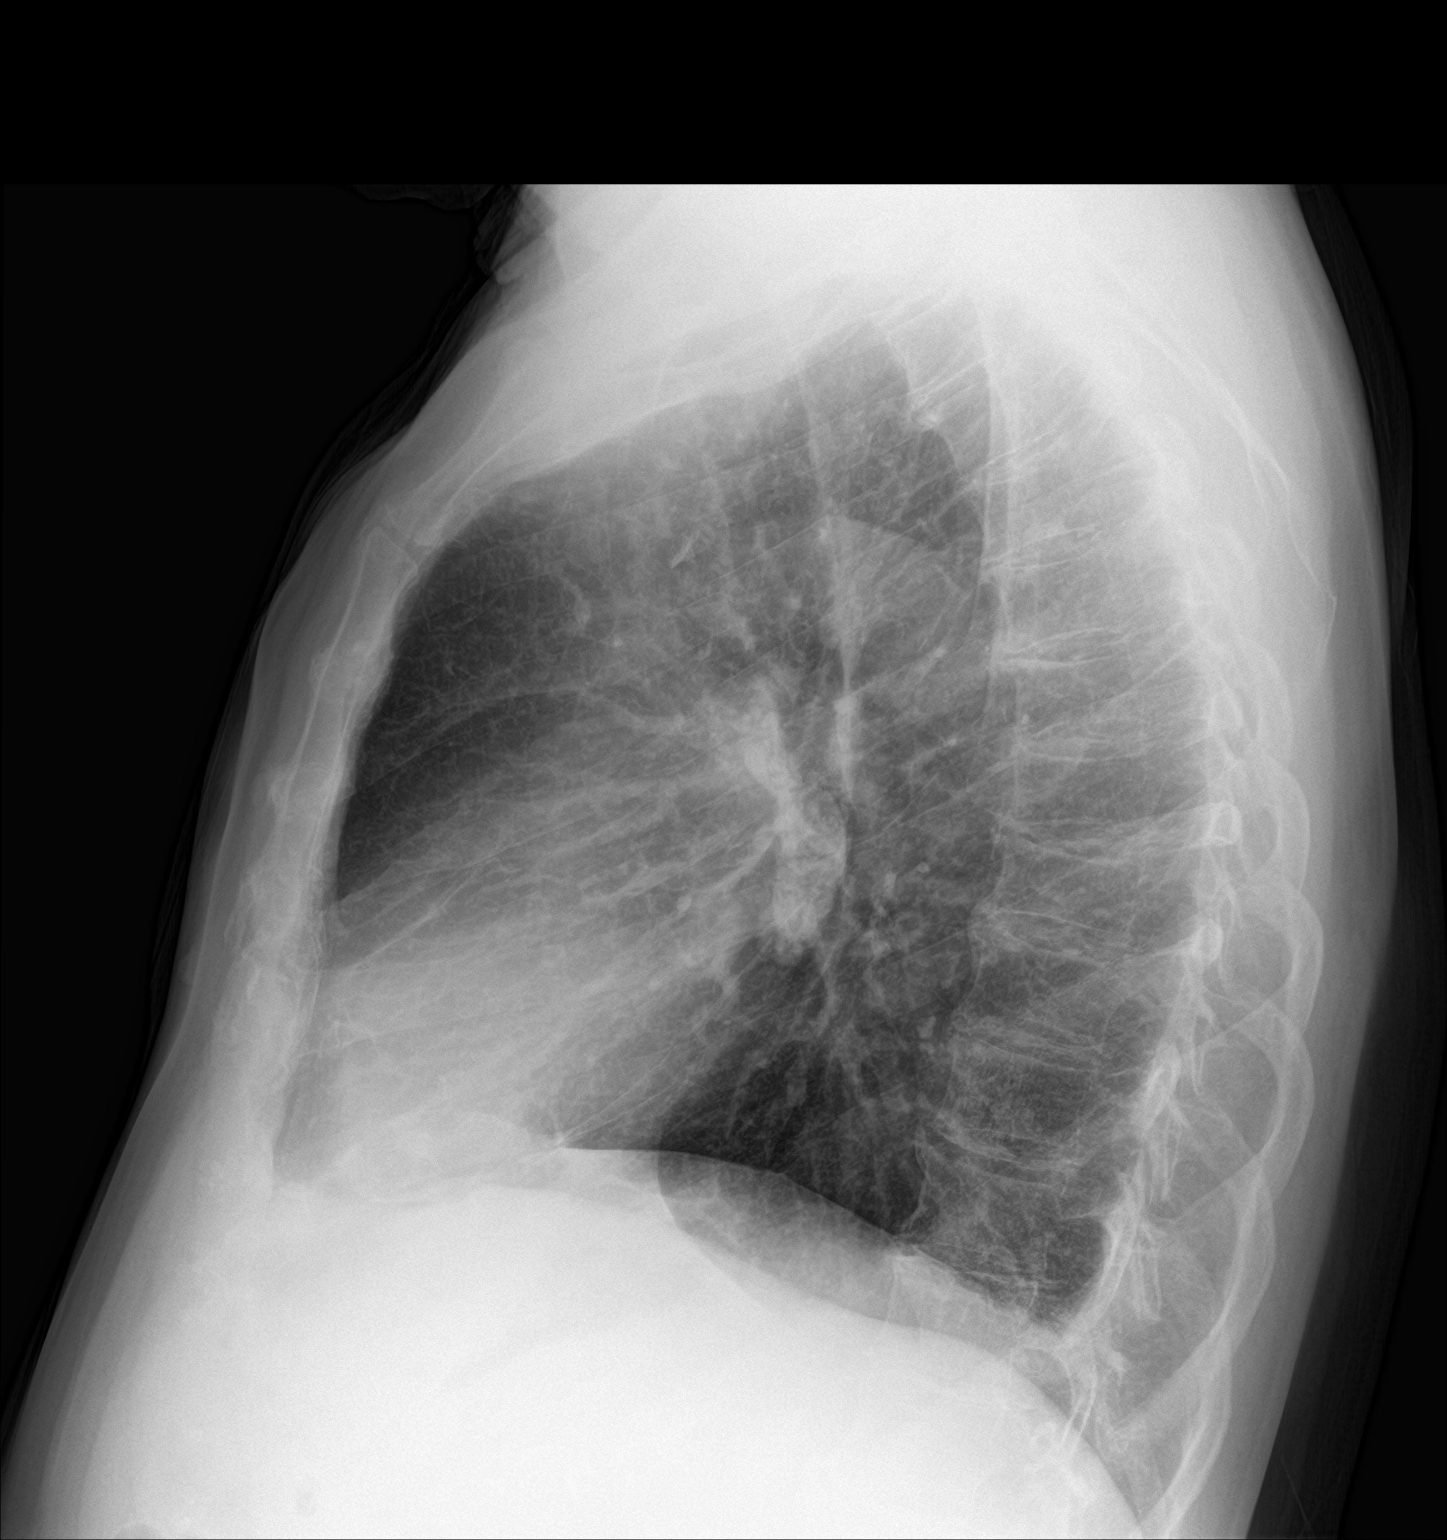

[2 of 2 positions shown; findings below may reference images not displayed]

FINDINGS: The heart size and mediastinal contours are within normal limits.
Both lungs are clear. No pneumothorax or pleural effusion is noted.
Mildly displaced left sixth and seventh rib fractures are noted.
IMPRESSION: Mildly displaced left sixth and seventh rib fractures.

## 2021-01-13 ENCOUNTER — Telehealth: Payer: Self-pay | Admitting: Family Medicine

## 2021-01-13 MED ORDER — GABAPENTIN 300 MG PO CAPS: 900.0000 mg | ORAL_CAPSULE | Freq: Three times a day (TID) | ORAL | 1 refills | Status: DC

## 2021-01-13 NOTE — Telephone Encounter (Signed)
Patient called says [employer] says Rx still not rcvd frm us--reviewed pt's chart discovered E-script failed :  Outpatient Medication Detail   Disp Refills Start End   gabapentin (NEURONTIN) 300 MG capsule 240 capsule 1 01/04/2021    Sig - Route: Take 3 capsules (900 mg total) by mouth 3 (three) times daily. - Oral   Sent to pharmacy as: gabapentin (NEURONTIN) 300 MG capsule   E-Prescribing Status: Transmission to pharmacy failed (01/04/2021 10:07 AM EST)    ----[place] request to med asst for review/ Refills order  w/ Dr. Jordan Likes --send to [facility] :   [facility] [place], [place] - [address] Phone:  [phone]  Fax:  [phone]     -glh

## 2021-01-13 NOTE — Telephone Encounter (Signed)
Rx re-sent. See meds.  

## 2021-02-14 NOTE — Progress Notes (Addendum)
[facility] [address], [address] [place], [place] [address] [phone]  Date:  02/15/2021   Name:  Jonathan Harrington   DOB:  02-08-1956   MRN:  [id number]  PCP:  Pearline Cables, MD    Chief Complaint: Medical Management of Chronic Issues (6 month f/u -- request refill of hydrocodone )   History of Present Illness:  Jonathan Harrington is a 65 y.o. very pleasant male patient who presents with the following:  Patient here today for an in person follow-up visit- history of anxiety, diverticulitis, neuropathy, hypertension, chronic joint pain Last seen by myself in September  He has neuropathy and lower limb claudication which causes pain with standing.  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  This is a positive thing in his life since he lost his wife several years ago  Cologuard up-to-date COVID series complete Flu shot done Can suggest Shingrix  Needs UDS today  He did have a BMP back in January which showed mild hyperkalemia, worsening of renal function-we will need to follow-up on this today PSA up-to-date  He has smoked for about 40 years  He smokes about 1.5 PPD  We discussed lung cancer screening CT.  He is little bit nervous that we could discover cancer, but is willing to have the test  He also notes he has been coughing up some phlegm for about a month.  He does have known history of COPD, known no fever   01/05/2021  12/31/2020   2  Hydrocodone-Acetamin 7.5-325  70.00  24  Je Cop  [id number]  Hum (5313)  0/0  21.88 MME  Medicare  [place]    01/01/2021  12/31/2020   2  Alprazolam 0.25 Mg Tablet  45.00  23  Je Cop  [id number]  Hum (9851)  0/2  0.98 LME  Medicare  [place]    12/08/2020  11/02/2020   2  Alprazolam 0.25 Mg Tablet  45.00  23  Je Cop  [id number]  Nor (2372)  1/2  0.98 LME  Medicare  [place]    12/07/2020   12/07/2020   2  Hydrocodone-Acetamin 7.5-325  70.00  23  Je Cop  [id number]  Nor (2372)  0/0  22.83 MME  Medicare  [place]    11/02/2020  11/02/2020   2  Alprazolam 0.25 Mg Tablet  45.00  23  Je Cop  [id number]  Nor (2372)  0/2  0.98 LME  Medicare  [place]    11/02/2020  11/02/2020   2  Hydrocodone-Acetamin 7.5-325  70.00  23  Je Cop  [id number]  Nor (2372)  0/0  22.83 MME  Medicare  [place]    09/30/2020  09/30/2020   2  Alprazolam 0.25 Mg Tablet  45.00  30  Je Cop  [id number]  Nor (2372)  0/2  0.75 LME  Medicare      09/30/2020  09/30/2020   2  Hydrocodone-Acetamin 7.5-325  70.00  23  Je Cop  [id number]         Patient Active Problem List   Diagnosis Date Noted  . Cough 12/20/2020  . Acute exacerbation of chronic obstructive pulmonary disease (COPD) (HCC) 12/20/2020  . Rib pain 12/20/2020  . Neuropathy 06/04/2019  . Alcoholism (HCC) 11/19/2018  . Essential hypertension 06/06/2016  . Smoker 05/12/2016  .  Left inguinal hernia 05/23/2014  . Anxiety state, unspecified 09/17/2013  . Gout 01/17/2013  . DIVERTICULOSIS-COLON 08/13/2010  . DIVERTICULITIS, COLON 08/13/2010  . ABDOMINAL PAIN-LLQ 08/13/2010  . Nonspecific (abnormal) findings on radiological and other examination of body structure 08/13/2010  . PERSONAL HX COLONIC POLYPS 08/13/2010  . NONSPCIFC ABN FINDING RAD & OTH EXAM LUNG FIELD 08/13/2010    Past Medical History:  Diagnosis Date  . Alcoholism (HCC)   . Allergy   . Anxiety   . COPD (chronic obstructive pulmonary disease) (HCC)   . Diverticulitis 2011   resulted in partial colectomy  . Essential hypertension 06/06/2016  . Full dentures   . Gout   . Hematuria   . Wears glasses     Past Surgical History:  Procedure Laterality Date  . ABDOMINAL AORTOGRAM W/LOWER EXTREMITY Bilateral 07/19/2019   Procedure: ABDOMINAL AORTOGRAM W/LOWER EXTREMITY;  Surgeon: Sherren KernsFields, Charles E, MD;  [facility];  Service: Cardiovascular;  Laterality: Bilateral;  . COLON SURGERY  2011   partial  colectomy  . COLONOSCOPY    . FINGER ARTHROPLASTY  1990   rt index fx  . HERNIA REPAIR    . INGUINAL HERNIA REPAIR Left 05/27/2014   Procedure: LEFT INGUINAL HERNIA REPAIR WITH MESH;  Surgeon: Wilmon ArmsMatthew K. Corliss Skainssuei, MD;  Location: [facility];  Service: General;  Laterality: Left;  . INSERTION OF MESH Left 05/27/2014   Procedure: INSERTION OF MESH;  Surgeon: Wilmon ArmsMatthew K. Corliss Skainssuei, MD;  Location: [facility];  Service: General;  Laterality: Left;    Social History   Tobacco Use  . Smoking status: Current Every Day Smoker    Packs/day: 2.00  . Smokeless tobacco: Never Used  Vaping Use  . Vaping Use: Never used  Substance Use Topics  . Alcohol use: Yes    Alcohol/week: 21.0 standard drinks    Types: 21 Cans of beer per week    Comment: about 3 beers daily- history of heavy liquor abuse  . Drug use: No    Family History  Problem Relation Age of Onset  . Alzheimer's disease Mother   . Cancer Sister   . Neuropathy Neg Hx     Allergies  Allergen Reactions  . Ibuprofen Swelling    Medication list has been reviewed and updated.  Current Outpatient Medications on File Prior to Visit  Medication Sig Dispense Refill  . albuterol (VENTOLIN HFA) 108 (90 Base) MCG/ACT inhaler Inhale 2 puffs into the lungs every 6 (six) hours as needed for wheezing or shortness of breath. 1 Inhaler 6  . allopurinol (ZYLOPRIM) 300 MG tablet Take 1 tablet (300 mg total) by mouth daily. 90 tablet 1  . ALPRAZolam (XANAX) 0.25 MG tablet TAKE 1 TABLET BY MOUTH SCHEDULED EVERY MORNING & MAY REPEAT IN THE EVENING AS NEEDED FOR ANXIETY. 45 tablet 2  . amLODipine (NORVASC) 10 MG tablet Take 1 tablet (10 mg total) by mouth daily. 90 tablet 2  . ascorbic Acid (VITAMIN C) 500 MG CPCR Take 500 mg by mouth daily.    . Cholecalciferol (VITAMIN D3) 50 MCG (2000 UT) TABS Take by mouth.    . cyanocobalamin 2000 MCG tablet Take 2,000 mcg by mouth daily.    . diphenhydrAMINE HCl, Sleep, (SLEEP-AID) 50 MG  CAPS Take 50 mg by mouth at bedtime.    . gabapentin (NEURONTIN) 300 MG capsule Take 3 capsules (900 mg total) by mouth 3 (three) times daily. 240 capsule 1  . HYDROcodone-acetaminophen (NORCO) 7.5-325 MG  tablet Take 1 tablet by mouth every 8 (eight) hours as needed for moderate pain. 70 tablet 0  . lisinopril (ZESTRIL) 40 MG tablet Take 1 tablet (40 mg total) by mouth daily. 90 tablet 3  . rosuvastatin (CRESTOR) 20 MG tablet Take 1 tablet (20 mg total) by mouth daily. 90 tablet 3  . triamcinolone cream (KENALOG) 0.1 % Apply 1 application topically 2 (two) times daily as needed (eczema on leg). 90 g 2   No current facility-administered medications on file prior to visit.    Review of Systems:  As per HPI- otherwise negative.   Physical Examination: Vitals:   02/15/21 1500  BP: 112/60  Pulse: 96  Temp: 97.9 F (36.6 C)  SpO2: 97%   Vitals:   02/15/21 1500  Weight: 175 lb 6.4 oz (79.6 kg)  Height: 6' (1.829 m)   Body mass index is 23.79 kg/m. Ideal Body Weight: Weight in (lb) to have BMI = 25: 183.9  GEN: no acute distress.  Normal weight, looks his normal self HEENT: Atraumatic, Normocephalic.  Ears and Nose: No external deformity. CV: RRR, No M/G/R. No JVD. No thrill. No extra heart sounds. PULM: CTA B, no wheezes, crackles, rhonchi. No retractions. No resp. distress. No accessory muscle use. ABD: S, NT, ND, +BS. No rebound. No HSM. EXTR: No c/c/e PSYCH: Normally interactive. Conversant.    BP is on the low side today BP Readings from Last 3 Encounters:  02/15/21 112/60  12/17/20 (!) 118/52  08/24/20 120/82     Assessment and Plan: Essential hypertension - Plan: CBC, Comprehensive metabolic panel, amLODipine (NORVASC) 5 MG tablet  Hyperkalemia - Plan: Comprehensive metabolic panel  GAD (generalized anxiety disorder) - Plan: DRUG MONITORING, PANEL 8 WITH CONFIRMATION, URINE  Chronic bilateral low back pain without sciatica - Plan: DRUG MONITORING, PANEL 8 WITH  CONFIRMATION, URINE, HYDROcodone-acetaminophen (NORCO) 7.5-325 MG tablet  Dyslipidemia - Plan: Lipid panel  Avascular necrosis of bones of both hips (HCC) - Plan: DRUG MONITORING, PANEL 8 WITH CONFIRMATION, URINE  Intermittent claudication (HCC) - Plan: DRUG MONITORING, PANEL 8 WITH CONFIRMATION, URINE  Acute bronchitis with COPD (HCC) - Plan: doxycycline (VIBRAMYCIN) 100 MG capsule  Screening for lung cancer - Plan: CT CHEST LUNG CA SCREEN LOW DOSE W/O CM  Following up today.  Blood pressure has been borderline low for the last several months.  Will decrease amlodipine from 10 mg to 5 Check potassium and renal function today, check cholesterol Ordered low-dose lung cancer screening CT Annual UDS today Refill hydrocodone that uses for chronic pain We will treat bronchitis with doxycycline 100 mg twice daily for 10 days.  I advised patient that if productive cough continues after that he may have chronic bronchitis is a subset of COPD Will plan further follow- up pending labs.  This visit occurred during the SARS-CoV-2 public health emergency.  Safety protocols were in place, including screening questions prior to the visit, additional usage of staff PPE, and extensive cleaning of exam room while observing appropriate contact time as indicated for disinfecting solutions.     Signed Abbe Amsterdam, MD  Received his labs as below, message to pt  Results for orders placed or performed in visit on 02/15/21  CBC  Result Value Ref Range   WBC 6.3 4.0 - 10.5 K/uL   RBC 3.40 (L) 4.22 - 5.81 Mil/uL   Platelets 189.0 150.0 - 400.0 K/uL   Hemoglobin 12.2 (L) 13.0 - 17.0 g/dL   HCT 53.6 (L) 14.4 - 31.5 %  MCV 103.9 (H) 78.0 - 100.0 fl   MCHC 34.5 30.0 - 36.0 g/dL   RDW 13.2 44.0 - 10.2 %  Comprehensive metabolic panel  Result Value Ref Range   Sodium 140 135 - 145 mEq/L   Potassium 4.4 3.5 - 5.1 mEq/L   Chloride 104 96 - 112 mEq/L   CO2 26 19 - 32 mEq/L   Glucose, Bld 105 (H) 70 -  99 mg/dL   BUN 18 6 - 23 mg/dL   Creatinine, Ser 7.25 (H) 0.40 - 1.50 mg/dL   Total Bilirubin 0.4 0.2 - 1.2 mg/dL   Alkaline Phosphatase 66 39 - 117 U/L   AST 26 0 - 37 U/L   ALT 9 0 - 53 U/L   Total Protein 6.8 6.0 - 8.3 g/dL   Albumin 3.9 3.5 - 5.2 g/dL   GFR 36.64 (L) >40.34 mL/min   Calcium 9.0 8.4 - 10.5 mg/dL  Lipid panel  Result Value Ref Range   Cholesterol 86 0 - 200 mg/dL   Triglycerides 742.5 (H) 0.0 - 149.0 mg/dL   HDL 95.63 (L) >87.56 mg/dL   VLDL 43.3 0.0 - 29.5 mg/dL   LDL Cholesterol 18 0 - 99 mg/dL   Total CHOL/HDL Ratio 2    NonHDL 50.63

## 2021-02-15 ENCOUNTER — Ambulatory Visit (INDEPENDENT_AMBULATORY_CARE_PROVIDER_SITE_OTHER): Payer: Medicare HMO | Admitting: Family Medicine

## 2021-02-15 ENCOUNTER — Encounter: Payer: Self-pay | Admitting: Family Medicine

## 2021-02-15 ENCOUNTER — Other Ambulatory Visit: Payer: Self-pay

## 2021-02-15 VITALS — BP 112/60 | HR 96 | Temp 97.9°F | Ht 72.0 in | Wt 175.4 lb

## 2021-02-15 DIAGNOSIS — E785 Hyperlipidemia, unspecified: Secondary | ICD-10-CM | POA: Diagnosis not present

## 2021-02-15 DIAGNOSIS — Z122 Encounter for screening for malignant neoplasm of respiratory organs: Secondary | ICD-10-CM | POA: Diagnosis not present

## 2021-02-15 DIAGNOSIS — M87052 Idiopathic aseptic necrosis of left femur: Secondary | ICD-10-CM

## 2021-02-15 DIAGNOSIS — J440 Chronic obstructive pulmonary disease with acute lower respiratory infection: Secondary | ICD-10-CM

## 2021-02-15 DIAGNOSIS — E875 Hyperkalemia: Secondary | ICD-10-CM | POA: Diagnosis not present

## 2021-02-15 DIAGNOSIS — M545 Low back pain, unspecified: Secondary | ICD-10-CM | POA: Diagnosis not present

## 2021-02-15 DIAGNOSIS — I10 Essential (primary) hypertension: Secondary | ICD-10-CM | POA: Diagnosis not present

## 2021-02-15 DIAGNOSIS — G8929 Other chronic pain: Secondary | ICD-10-CM | POA: Diagnosis not present

## 2021-02-15 DIAGNOSIS — M87051 Idiopathic aseptic necrosis of right femur: Secondary | ICD-10-CM | POA: Diagnosis not present

## 2021-02-15 DIAGNOSIS — F411 Generalized anxiety disorder: Secondary | ICD-10-CM | POA: Diagnosis not present

## 2021-02-15 DIAGNOSIS — N189 Chronic kidney disease, unspecified: Secondary | ICD-10-CM

## 2021-02-15 DIAGNOSIS — I739 Peripheral vascular disease, unspecified: Secondary | ICD-10-CM

## 2021-02-15 DIAGNOSIS — J209 Acute bronchitis, unspecified: Secondary | ICD-10-CM

## 2021-02-15 MED ORDER — AMLODIPINE BESYLATE 5 MG PO TABS: 5.0000 mg | ORAL_TABLET | Freq: Every day | ORAL | 3 refills | Status: DC

## 2021-02-15 MED ORDER — DOXYCYCLINE HYCLATE 100 MG PO CAPS: 100.0000 mg | ORAL_CAPSULE | Freq: Two times a day (BID) | ORAL | 0 refills | Status: DC

## 2021-02-15 MED ORDER — HYDROCODONE-ACETAMINOPHEN 7.5-325 MG PO TABS: 1.0000 | ORAL_TABLET | Freq: Three times a day (TID) | ORAL | 0 refills | Status: DC | PRN

## 2021-02-15 NOTE — Patient Instructions (Addendum)
Please call urology and request an appt   [facility]  [address], [address] [place], [place] [address] [phone]  Let's decrease your amlodipine to 5 mg daily as your BP is a bit low Take the doxycycline antibiotic for 10 days for cough- let me know how this works for you We ordered a lung cancer screening CT for you- this can be done annually at the medCenter I will be in touch with your labs asap

## 2021-02-16 ENCOUNTER — Encounter: Payer: Self-pay | Admitting: Family Medicine

## 2021-02-16 LAB — CBC
HCT: 35.3 % — ABNORMAL LOW (ref 39.0–52.0)
Hemoglobin: 12.2 g/dL — ABNORMAL LOW (ref 13.0–17.0)
MCHC: 34.5 g/dL (ref 30.0–36.0)
MCV: 103.9 fl — ABNORMAL HIGH (ref 78.0–100.0)
Platelets: 189 10*3/uL (ref 150.0–400.0)
RBC: 3.4 Mil/uL — ABNORMAL LOW (ref 4.22–5.81)
RDW: 15 % (ref 11.5–15.5)
WBC: 6.3 10*3/uL (ref 4.0–10.5)

## 2021-02-16 LAB — COMPREHENSIVE METABOLIC PANEL
ALT: 9 U/L (ref 0–53)
AST: 26 U/L (ref 0–37)
Albumin: 3.9 g/dL (ref 3.5–5.2)
Alkaline Phosphatase: 66 U/L (ref 39–117)
BUN: 18 mg/dL (ref 6–23)
CO2: 26 mEq/L (ref 19–32)
Calcium: 9 mg/dL (ref 8.4–10.5)
Chloride: 104 mEq/L (ref 96–112)
Creatinine, Ser: 2.04 mg/dL — ABNORMAL HIGH (ref 0.40–1.50)
GFR: 33.24 mL/min — ABNORMAL LOW (ref >60.00)
Glucose, Bld: 105 mg/dL — ABNORMAL HIGH (ref 70–99)
Potassium: 4.4 mEq/L (ref 3.5–5.1)
Sodium: 140 mEq/L (ref 135–145)
Total Bilirubin: 0.4 mg/dL (ref 0.2–1.2)
Total Protein: 6.8 g/dL (ref 6.0–8.3)

## 2021-02-16 LAB — LIPID PANEL
Cholesterol: 86 mg/dL (ref 0–200)
HDL: 35.7 mg/dL — ABNORMAL LOW (ref >39.00)
LDL Cholesterol: 18 mg/dL (ref 0–99)
NonHDL: 50.63
Total CHOL/HDL Ratio: 2
Triglycerides: 162 mg/dL — ABNORMAL HIGH (ref 0.0–149.0)
VLDL: 32.4 mg/dL (ref 0.0–40.0)

## 2021-02-16 NOTE — Addendum Note (Signed)
Addended by: Abbe Amsterdam C on: 02/16/2021 06:19 PM   Modules accepted: Orders

## 2021-02-17 ENCOUNTER — Telehealth: Payer: Self-pay | Admitting: *Deleted

## 2021-02-17 ENCOUNTER — Other Ambulatory Visit: Payer: Medicare HMO

## 2021-02-17 DIAGNOSIS — D539 Nutritional anemia, unspecified: Secondary | ICD-10-CM

## 2021-02-17 NOTE — Addendum Note (Signed)
Addended by: Miguel Aschoff on: 02/17/2021 02:51 PM   Modules accepted: Orders

## 2021-02-17 NOTE — Telephone Encounter (Signed)
Received call from [facility] lab stating they are not able to add B12 to sample they have due to stability of 24 hours.  [facility] lists stability up to 7 days. Future lab order placed for [facility] and [facility] will send specimen there for testing.

## 2021-02-18 ENCOUNTER — Encounter: Payer: Self-pay | Admitting: Family Medicine

## 2021-02-18 LAB — DM TEMPLATE

## 2021-02-18 LAB — DRUG MONITORING, PANEL 8 WITH CONFIRMATION, URINE
6 Acetylmorphine: NEGATIVE ng/mL (ref <10)
Alcohol Metabolites: POSITIVE ng/mL — AB
Alphahydroxyalprazolam: 164 ng/mL — ABNORMAL HIGH (ref <25)
Alphahydroxymidazolam: NEGATIVE ng/mL (ref <50)
Alphahydroxytriazolam: NEGATIVE ng/mL (ref <50)
Aminoclonazepam: NEGATIVE ng/mL (ref <25)
Amphetamines: NEGATIVE ng/mL (ref <500)
Benzodiazepines: POSITIVE ng/mL — AB (ref <100)
Buprenorphine, Urine: NEGATIVE ng/mL (ref <5)
Cocaine Metabolite: NEGATIVE ng/mL (ref <150)
Codeine: NEGATIVE ng/mL (ref <50)
Creatinine: 189.1 mg/dL
Ethyl Glucuronide (ETG): >100000 ng/mL — ABNORMAL HIGH (ref <500)
Ethyl Sulfate (ETS): >100000 ng/mL — ABNORMAL HIGH (ref <100)
Hydrocodone: 1398 ng/mL — ABNORMAL HIGH (ref <50)
Hydromorphone: 401 ng/mL — ABNORMAL HIGH (ref <50)
Hydroxyethylflurazepam: NEGATIVE ng/mL (ref <50)
Lorazepam: NEGATIVE ng/mL (ref <50)
MDMA: NEGATIVE ng/mL (ref <500)
Marijuana Metabolite: NEGATIVE ng/mL (ref <20)
Morphine: NEGATIVE ng/mL (ref <50)
Nordiazepam: NEGATIVE ng/mL (ref <50)
Norhydrocodone: 1045 ng/mL — ABNORMAL HIGH (ref <50)
Opiates: POSITIVE ng/mL — AB (ref <100)
Oxazepam: NEGATIVE ng/mL (ref <50)
Oxidant: NEGATIVE ug/mL
Oxycodone: NEGATIVE ng/mL (ref <100)
Temazepam: NEGATIVE ng/mL (ref <50)
pH: 5.3 (ref 4.5–9.0)

## 2021-02-18 LAB — VITAMIN B12: Vitamin B-12: >2000 pg/mL — ABNORMAL HIGH (ref 200–1100)

## 2021-02-18 NOTE — Progress Notes (Signed)
Received labs as below  Results for orders placed or performed in visit on 02/17/21  Vitamin B12  Result Value Ref Range   Vitamin B-12 >2,000 (H) 200 - 1,100 pg/mL

## 2021-02-23 ENCOUNTER — Ambulatory Visit (HOSPITAL_BASED_OUTPATIENT_CLINIC_OR_DEPARTMENT_OTHER)
Admission: RE | Admit: 2021-02-23 | Discharge: 2021-02-23 | Disposition: A | Discharge status: Home / Self Care | Payer: Medicare HMO | Source: Ambulatory Visit | Attending: Family Medicine | Admitting: Family Medicine

## 2021-02-23 ENCOUNTER — Other Ambulatory Visit: Payer: Self-pay

## 2021-02-23 DIAGNOSIS — Z122 Encounter for screening for malignant neoplasm of respiratory organs: Secondary | ICD-10-CM | POA: Diagnosis not present

## 2021-02-23 DIAGNOSIS — I700 Atherosclerosis of aorta: Secondary | ICD-10-CM | POA: Diagnosis not present

## 2021-02-23 DIAGNOSIS — I2510 Atherosclerotic heart disease of native coronary artery without angina pectoris: Secondary | ICD-10-CM | POA: Insufficient documentation

## 2021-02-23 DIAGNOSIS — J432 Centrilobular emphysema: Secondary | ICD-10-CM | POA: Diagnosis not present

## 2021-02-23 DIAGNOSIS — F17210 Nicotine dependence, cigarettes, uncomplicated: Secondary | ICD-10-CM | POA: Insufficient documentation

## 2021-02-23 DIAGNOSIS — Z87891 Personal history of nicotine dependence: Secondary | ICD-10-CM | POA: Diagnosis not present

## 2021-02-26 ENCOUNTER — Encounter: Payer: Self-pay | Admitting: Family Medicine

## 2021-02-26 ENCOUNTER — Other Ambulatory Visit: Payer: Self-pay | Admitting: Family Medicine

## 2021-02-26 DIAGNOSIS — M954 Acquired deformity of chest and rib: Secondary | ICD-10-CM

## 2021-03-05 ENCOUNTER — Encounter (HOSPITAL_COMMUNITY): Payer: Medicare HMO | Attending: Family Medicine

## 2021-03-05 ENCOUNTER — Encounter (HOSPITAL_COMMUNITY): Payer: Medicare HMO

## 2021-03-18 DIAGNOSIS — E785 Hyperlipidemia, unspecified: Secondary | ICD-10-CM | POA: Diagnosis not present

## 2021-03-18 DIAGNOSIS — I129 Hypertensive chronic kidney disease with stage 1 through stage 4 chronic kidney disease, or unspecified chronic kidney disease: Secondary | ICD-10-CM | POA: Diagnosis not present

## 2021-03-18 DIAGNOSIS — N200 Calculus of kidney: Secondary | ICD-10-CM | POA: Diagnosis not present

## 2021-03-18 DIAGNOSIS — N179 Acute kidney failure, unspecified: Secondary | ICD-10-CM | POA: Diagnosis not present

## 2021-03-18 DIAGNOSIS — I739 Peripheral vascular disease, unspecified: Secondary | ICD-10-CM | POA: Diagnosis not present

## 2021-03-18 DIAGNOSIS — N1832 Chronic kidney disease, stage 3b: Secondary | ICD-10-CM | POA: Diagnosis not present

## 2021-03-18 DIAGNOSIS — Z716 Tobacco abuse counseling: Secondary | ICD-10-CM | POA: Diagnosis not present

## 2021-03-19 ENCOUNTER — Other Ambulatory Visit: Payer: Self-pay | Admitting: Family Medicine

## 2021-03-19 DIAGNOSIS — M545 Low back pain, unspecified: Secondary | ICD-10-CM

## 2021-03-19 DIAGNOSIS — G8929 Other chronic pain: Secondary | ICD-10-CM

## 2021-03-19 MED ORDER — HYDROCODONE-ACETAMINOPHEN 7.5-325 MG PO TABS: 1.0000 | ORAL_TABLET | Freq: Three times a day (TID) | ORAL | 0 refills | Status: DC | PRN

## 2021-03-19 NOTE — Telephone Encounter (Signed)
Medication:  HYDROcodone-acetaminophen (NORCO) 7.5-325 MG tablet [[id number]]   Has the patient contacted their pharmacy? No. (If no, request that the patient contact the pharmacy for the refill.) (If yes, when and what did the pharmacy advise?)  Preferred Pharmacy (with phone number or street name):  [facility] - [place], [place] - [address]  [address] [place] [address]  Phone:  [phone] Fax:  [phone]  DEA #:  --  DAW Reason: --     Agent: Please be advised that RX refills may take up to 3 business days. We ask that you follow-up with your pharmacy.

## 2021-03-22 ENCOUNTER — Other Ambulatory Visit: Payer: Self-pay | Admitting: Nephrology

## 2021-03-22 DIAGNOSIS — N200 Calculus of kidney: Secondary | ICD-10-CM

## 2021-03-22 DIAGNOSIS — N179 Acute kidney failure, unspecified: Secondary | ICD-10-CM

## 2021-03-22 DIAGNOSIS — I129 Hypertensive chronic kidney disease with stage 1 through stage 4 chronic kidney disease, or unspecified chronic kidney disease: Secondary | ICD-10-CM

## 2021-03-23 ENCOUNTER — Other Ambulatory Visit: Payer: Self-pay | Admitting: Family Medicine

## 2021-03-23 DIAGNOSIS — F4321 Adjustment disorder with depressed mood: Secondary | ICD-10-CM

## 2021-03-23 DIAGNOSIS — G8929 Other chronic pain: Secondary | ICD-10-CM

## 2021-03-23 DIAGNOSIS — F411 Generalized anxiety disorder: Secondary | ICD-10-CM

## 2021-03-23 MED ORDER — HYDROCODONE-ACETAMINOPHEN 7.5-325 MG PO TABS
1.0000 | ORAL_TABLET | Freq: Three times a day (TID) | ORAL | 0 refills | Status: DC | PRN
Start: 2021-03-23 — End: 2021-06-11

## 2021-03-23 NOTE — Telephone Encounter (Signed)
Medication: HYDROcodone-acetaminophen (NORCO) 7.5-325 MG tablet [[id number]]       Has the patient contacted their pharmacy? no (If no, request that the patient contact the pharmacy for the refill.) (If yes, when and what did the pharmacy advise?)    Preferred Pharmacy (with phone number or street name): [facility] - [place], [place] - [address]  [address] [place] [address]  Phone:  [phone] Fax:  [phone]     Agent: Please be advised that RX refills may take up to 3 business days. We ask that you follow-up with your pharmacy.

## 2021-03-31 ENCOUNTER — Other Ambulatory Visit: Payer: Medicare HMO

## 2021-04-29 DIAGNOSIS — M545 Low back pain, unspecified: Secondary | ICD-10-CM | POA: Diagnosis not present

## 2021-04-29 DIAGNOSIS — M47817 Spondylosis without myelopathy or radiculopathy, lumbosacral region: Secondary | ICD-10-CM | POA: Diagnosis not present

## 2021-05-02 NOTE — Progress Notes (Signed)
[facility] [address], [address] [place], [place] [address] [phone] [phone]  Date:  05/03/2021   Name:  Jonathan Harrington   DOB:  02/27/1956   MRN:  [id number]  PCP:  Pearline Cablesopland, Mana Morison C, MD    Chief Complaint: Back Pain (Worse for the last 4 days )   History of Present Illness:  Jonathan Harrington is a 65 y.o. very pleasant male patient who presents with the following:  Patient is here today for concern of back pain Most recent visit with myself was in March- history of anxiety, diverticulitis, neuropathy, hypertension, chronic joint pain, heavy tobacco use He has neuropathy and lower limb claudication-much worse with standing or walking. 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 1. Lung-RADS 2S, benign appearance or behavior. Continue annual screening with low-dose chest CT without contrast in 12 months. 2. The "S" modifier above refers to potentially clinically significant non lung cancer related findings. Specifically, there is a new sclerotic lesion in the sternum which has an aggressive appearance. Correlation with bone scan is recommended to exclude the possibility of a metastatic lesion. 3. Aortic atherosclerosis, in addition to left main and 3 vessel coronary artery disease. Please note that although the presence of coronary artery calcium documents the presence of coronary artery disease, the severity of this disease and any potential stenosis cannot be assessed on this non-gated CT examination. Assessment for potential risk factor modification, dietary therapy or pharmacologic therapy may be warranted, if clinically indicated. 4. Mild diffuse bronchial wall thickening with mild to moderate centrilobular and paraseptal emphysema; imaging findings suggestive of underlying COPD. 5. 4 mm nonobstructive  calculus in the upper pole collecting system of the right kidney. 6. Old granulomatous disease, as above.  Ordered bone scan for him-however this has not yet been done Reminded patient about this today, [facility]  I am treating him for chronic pain and for anxiety as below Annual urine drug screen is up-to-date  He actually went to [facility] last week as his back was bothering him more- he was seen we think on the 19th.  He had x-rays, and was told there was possibly a compression fracture.  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   The pain does seem to go down his right leg some of the time No numbness of his legs- they are weak but this is not new No bowel or bladder incontinence  They gave him a steroid shot which did help - he does still have to use his pain medication   He is having renal artery imaging tomorrow per nephrology   04/15/2021  03/23/2021   1  Hydrocodone-Acetamin 7.5-325  70.00  23  Je Cop  [id number]  Hum (5313)  0/0  22.83 MME  Medicare  [place]    03/24/2021  03/23/2021   1  Alprazolam 0.25 Mg Tablet  45.00  23  Je Cop  [id number]  Hum (9851)  0/1  0.98 LME  Medicare  [place]    03/23/2021  03/19/2021   1  Hydrocodone-Acetamin 7.5-325  70.00  24  Je Cop  [id number]  Hum (5313)  0/0  21.88 MME  Medicare  [place]    03/05/2021  12/31/2020   1  Alprazolam 0.25 Mg Tablet  45.00  23  Je Cop  [id number]  Hum (9851)  2/2  0.98 LME  Medicare  [place]    02/16/2021  02/15/2021   1  Hydrocodone-Acetamin 7.5-325  70.00  23  Je Cop  [id number]  Hum (5313)  0/0  22.83 MME  Medicare  [place]    02/15/2021  12/31/2020   1  Alprazolam 0.25 Mg Tablet  45.00  23  Je Cop  [id number]  Hum (9851)  1/2  0.98 LME  Medicare  [place]    01/05/2021  12/31/2020   1  Hydrocodone-Acetamin 7.5-325  70.00  24  Je Cop  [id number]   Hum (5313)  0/0  21.88 MME  Medicare  [place]    01/01/2021  12/31/2020   1  Alprazolam 0.25 Mg Tablet  45.00  23  Je Cop           Patient Active Problem List   Diagnosis Date Noted  . Cough 12/20/2020  . Acute exacerbation of chronic obstructive pulmonary disease (COPD) (HCC) 12/20/2020  . Rib pain 12/20/2020  . Neuropathy 06/04/2019  . Alcoholism (HCC) 11/19/2018  . Essential hypertension 06/06/2016  . Smoker 05/12/2016  . Left inguinal hernia 05/23/2014  . Anxiety state, unspecified 09/17/2013  . Gout 01/17/2013  . DIVERTICULOSIS-COLON 08/13/2010  . DIVERTICULITIS, COLON 08/13/2010  . ABDOMINAL PAIN-LLQ 08/13/2010  . Nonspecific (abnormal) findings on radiological and other examination of body structure 08/13/2010  . PERSONAL HX COLONIC POLYPS 08/13/2010  . NONSPCIFC ABN FINDING RAD & OTH EXAM LUNG FIELD 08/13/2010    Past Medical History:  Diagnosis Date  . Alcoholism (HCC)   . Allergy   . Anxiety   . COPD (chronic obstructive pulmonary disease) (HCC)   . Diverticulitis 2011   resulted in partial colectomy  . Essential hypertension 06/06/2016  . Full dentures   . Gout   . Hematuria   . Wears glasses     Past Surgical History:  Procedure Laterality Date  . ABDOMINAL AORTOGRAM W/LOWER EXTREMITY Bilateral 07/19/2019   Procedure: ABDOMINAL AORTOGRAM W/LOWER EXTREMITY;  Surgeon: Sherren Kerns, MD;  [facility] INVASIVE CV LAB;  Service: Cardiovascular;  Laterality: Bilateral;  . COLON SURGERY  2011   partial colectomy  . COLONOSCOPY    . FINGER ARTHROPLASTY  1990   rt index fx  . HERNIA REPAIR    . INGUINAL HERNIA REPAIR Left 05/27/2014   Procedure: LEFT INGUINAL HERNIA REPAIR WITH MESH;  Surgeon: Wilmon Arms. Corliss Skains, MD;  Location: [facility];  Service: General;  Laterality: Left;  . INSERTION OF MESH Left 05/27/2014   Procedure: INSERTION OF MESH;  Surgeon: Wilmon Arms. Corliss Skains, MD;  Location: [facility];  Service: General;  Laterality: Left;     Social History   Tobacco Use  . Smoking status: Current Every Day Smoker    Packs/day: 2.00  . Smokeless tobacco: Never Used  Vaping Use  . Vaping Use: Never used  Substance Use Topics  . Alcohol use: Yes    Alcohol/week: 21.0 standard drinks    Types: 21 Cans of beer per week    Comment: about 3 beers daily- history of heavy liquor abuse  . Drug use: No    Family History  Problem Relation Age of Onset  . Alzheimer's disease Mother   . Cancer Sister   . Neuropathy Neg Hx  Allergies  Allergen Reactions  . Ibuprofen Swelling    Medication list has been reviewed and updated.  Current Outpatient Medications on File Prior to Visit  Medication Sig Dispense Refill  . albuterol (VENTOLIN HFA) 108 (90 Base) MCG/ACT inhaler Inhale 2 puffs into the lungs every 6 (six) hours as needed for wheezing or shortness of breath. 1 Inhaler 6  . allopurinol (ZYLOPRIM) 300 MG tablet Take 1 tablet (300 mg total) by mouth daily. 90 tablet 1  . ALPRAZolam (XANAX) 0.25 MG tablet TAKE 1 TABLET SCHEDULED EVERY MORNING AND MAY REPEAT IN THE EVENING AS NEEDED FOR ANXIETY 45 tablet 1  . amLODipine (NORVASC) 5 MG tablet Take 1 tablet (5 mg total) by mouth daily. 90 tablet 3  . ascorbic Acid (VITAMIN C) 500 MG CPCR Take 500 mg by mouth daily.    . Cholecalciferol (VITAMIN D3) 50 MCG (2000 UT) TABS Take by mouth.    . diphenhydrAMINE HCl, Sleep, (SLEEP-AID) 50 MG CAPS Take 50 mg by mouth at bedtime.    . gabapentin (NEURONTIN) 300 MG capsule Take 3 capsules (900 mg total) by mouth 3 (three) times daily. 240 capsule 1  . HYDROcodone-acetaminophen (NORCO) 7.5-325 MG tablet Take 1 tablet by mouth every 8 (eight) hours as needed for moderate pain. 70 tablet 0  . lisinopril (ZESTRIL) 40 MG tablet Take 1 tablet (40 mg total) by mouth daily. 90 tablet 3  . rosuvastatin (CRESTOR) 20 MG tablet Take 1 tablet (20 mg total) by mouth daily. 90 tablet 3  . triamcinolone cream (KENALOG) 0.1 % Apply 1 application  topically 2 (two) times daily as needed (eczema on leg). 90 g 2   No current facility-administered medications on file prior to visit.    Review of Systems:  As per HPI- otherwise negative.   Physical Examination: Vitals:   05/03/21 1512  BP: (!) 142/60  Pulse: (!) 102  Temp: 98.7 F (37.1 C)  SpO2: 96%   Vitals:   05/03/21 1512  Weight: 177 lb 12.8 oz (80.6 kg)  Height: 5\' 10"  (1.778 m)   Body mass index is 25.51 kg/m. Ideal Body Weight: Weight in (lb) to have BMI = 25: 173.9  GEN: no acute distress.  Normal weight, appears his normal self HEENT: Atraumatic, Normocephalic.  Ears and Nose: No external deformity. CV: RRR, No M/G/R. No JVD. No thrill. No extra heart sounds. PULM: CTA B, no wheezes, crackles, rhonchi. No retractions. No resp. distress. No accessory muscle use. ABD: S, NT, ND, EXTR: No c/c/e PSYCH: Normally interactive. Conversant.  He notes tenderness in the lumbar paraspinous muscles, worse on his right.  Lumbar flexion and extension are both restricted.  He is able to raise up on his toes.  Negative straight leg raise bilaterally. Normal bilateral lower extremity strength, and sensation   Assessment and Plan: Chronic bilateral low back pain with right-sided sciatica - Plan: predniSONE (DELTASONE) 20 MG tablet  Lesion of sternum present on bone scan  Essential hypertension  Chronic renal impairment, unspecified CKD stage  Here today to follow-up lower back pain.  I have sent a records release to [facility] requesting his recent lumbar films.  Pending these results, we may want to proceed with an MRI.  He did get some relief from a steroid injection.  I have given him another 10 days of oral steroids.  He is already taking hydrocodone 7.5 mg Oral anti-inflammatories are not a good option given his renal function He is seeing nephrology, and has a  renal artery duplex pending for tomorrow   BP under ok control Reminded pt of importance of  getting bone scan to evaluate possible lesion of sternum   This visit occurred during the SARS-CoV-2 public health emergency.  Safety protocols were in place, including screening questions prior to the visit, additional usage of staff PPE, and extensive cleaning of exam room while observing appropriate contact time as indicated for disinfecting solutions.    Signed Abbe Amsterdam, MD

## 2021-05-03 ENCOUNTER — Other Ambulatory Visit: Payer: Self-pay

## 2021-05-03 ENCOUNTER — Ambulatory Visit (INDEPENDENT_AMBULATORY_CARE_PROVIDER_SITE_OTHER): Payer: Medicare HMO | Admitting: Family Medicine

## 2021-05-03 ENCOUNTER — Encounter: Payer: Self-pay | Admitting: Family Medicine

## 2021-05-03 VITALS — BP 142/60 | HR 84 | Temp 98.7°F | Ht 70.0 in | Wt 177.8 lb

## 2021-05-03 DIAGNOSIS — M899 Disorder of bone, unspecified: Secondary | ICD-10-CM

## 2021-05-03 DIAGNOSIS — I10 Essential (primary) hypertension: Secondary | ICD-10-CM

## 2021-05-03 DIAGNOSIS — M5441 Lumbago with sciatica, right side: Secondary | ICD-10-CM | POA: Diagnosis not present

## 2021-05-03 DIAGNOSIS — G8929 Other chronic pain: Secondary | ICD-10-CM

## 2021-05-03 DIAGNOSIS — N189 Chronic kidney disease, unspecified: Secondary | ICD-10-CM | POA: Diagnosis not present

## 2021-05-03 MED ORDER — PREDNISONE 20 MG PO TABS: ORAL_TABLET | ORAL | 0 refills | Status: DC

## 2021-05-03 NOTE — Patient Instructions (Addendum)
Good to see you today- I am sorry that your back is hurting so much I am requesting a copy of your recent back films and will be in touch with you - we may need to consider an MRI In the meantime please use the prednisone rx- you should be able to get a good price on this at [facility] pharmacy  Please be sure to schedule your bone scan!  Call [facility] and schedule this asap  Phone: [phone]

## 2021-05-04 ENCOUNTER — Ambulatory Visit
Admission: RE | Admit: 2021-05-04 | Discharge: 2021-05-04 | Disposition: A | Discharge status: Home / Self Care | Payer: Medicare HMO | Source: Ambulatory Visit | Attending: Nephrology | Admitting: Nephrology

## 2021-05-04 DIAGNOSIS — N179 Acute kidney failure, unspecified: Secondary | ICD-10-CM | POA: Diagnosis not present

## 2021-05-04 DIAGNOSIS — I129 Hypertensive chronic kidney disease with stage 1 through stage 4 chronic kidney disease, or unspecified chronic kidney disease: Secondary | ICD-10-CM

## 2021-05-04 DIAGNOSIS — I10 Essential (primary) hypertension: Secondary | ICD-10-CM | POA: Diagnosis not present

## 2021-05-04 DIAGNOSIS — N200 Calculus of kidney: Secondary | ICD-10-CM

## 2021-05-11 DIAGNOSIS — I129 Hypertensive chronic kidney disease with stage 1 through stage 4 chronic kidney disease, or unspecified chronic kidney disease: Secondary | ICD-10-CM | POA: Diagnosis not present

## 2021-05-11 DIAGNOSIS — E785 Hyperlipidemia, unspecified: Secondary | ICD-10-CM | POA: Diagnosis not present

## 2021-05-11 DIAGNOSIS — N179 Acute kidney failure, unspecified: Secondary | ICD-10-CM | POA: Diagnosis not present

## 2021-05-11 DIAGNOSIS — I739 Peripheral vascular disease, unspecified: Secondary | ICD-10-CM | POA: Diagnosis not present

## 2021-05-11 DIAGNOSIS — N200 Calculus of kidney: Secondary | ICD-10-CM | POA: Diagnosis not present

## 2021-05-21 ENCOUNTER — Other Ambulatory Visit: Payer: Self-pay | Admitting: Family Medicine

## 2021-05-24 NOTE — Telephone Encounter (Signed)
Pt called request refill of :  gabapentin (NEURONTIN) 300 MG capsule [[id number]]    Order Details Dose: 900 mg Route: Oral Frequency: 3 times daily  Dispense Quantity: 240 capsule Refills: 1        Sig: Take 3 capsules (900 mg total) by mouth 3 (three) times daily.        -Forwarding request to provider if approved send refill order to  IT consultant)   Pharmacy  [facility] - [place], [place] - [address]  [address] [place] [address]  Phone:  [phone]  Fax:  [phone]  DEA #:  --

## 2021-05-29 IMAGING — US US RENAL ARTERY STENOSIS
1 series · 14 of 25 positions shown · non-contrast
Comparison: CT abdomen pelvis from 01/09/2019

CLINICAL DATA: 65-year-old male with acute kidney injury and
hypertension.

EXAM:
RENAL/URINARY TRACT ULTRASOUND
RENAL DUPLEX DOPPLER ULTRASOUND

[Series 1: us renal artery stenosis · 0.26mm/px · 14 of 80 slices shown]
[im 1/80]
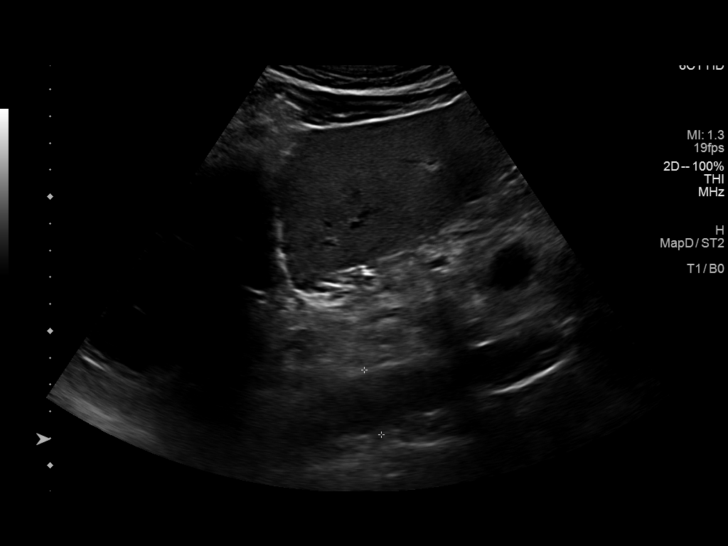
[im 7/80]
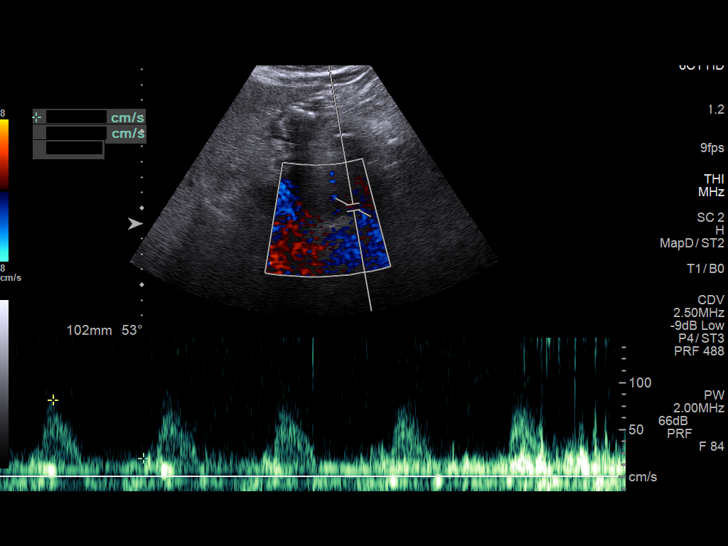
[im 14/80]
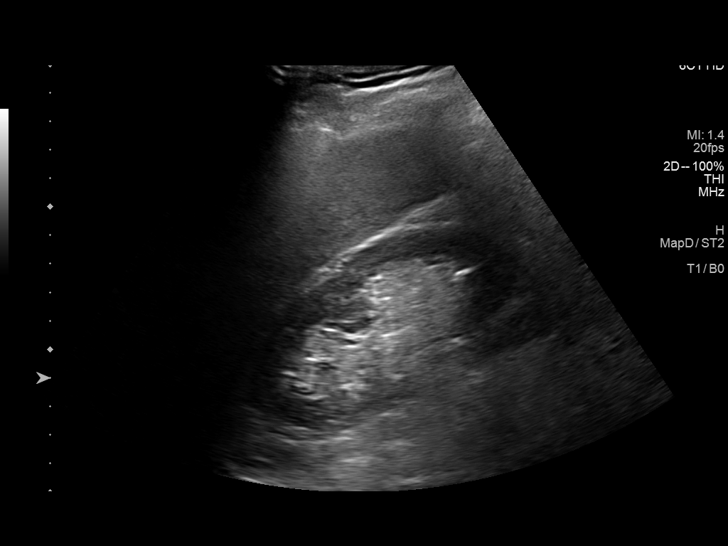
[im 20/80]
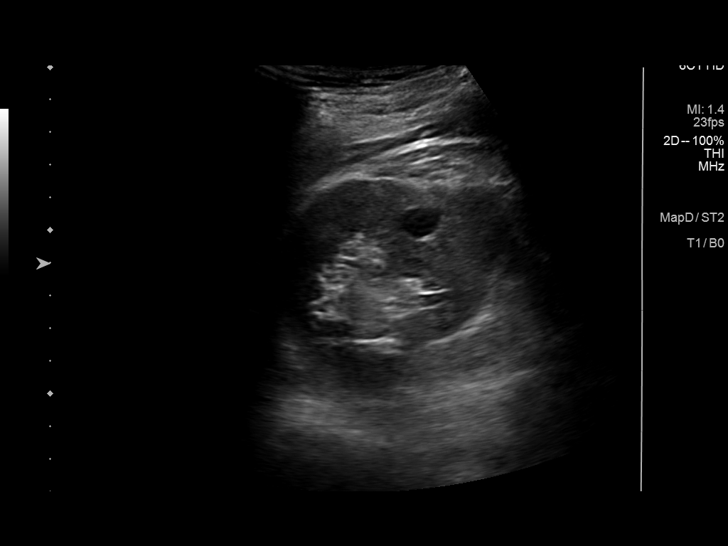
[im 27/80]
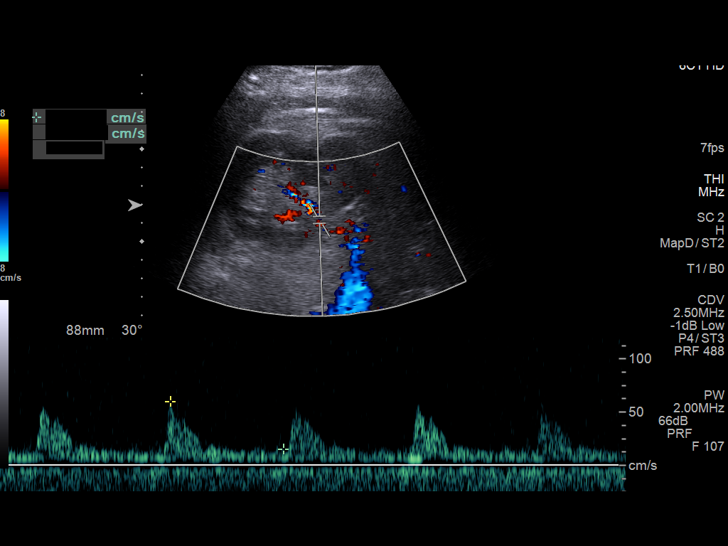
[im 30/80]
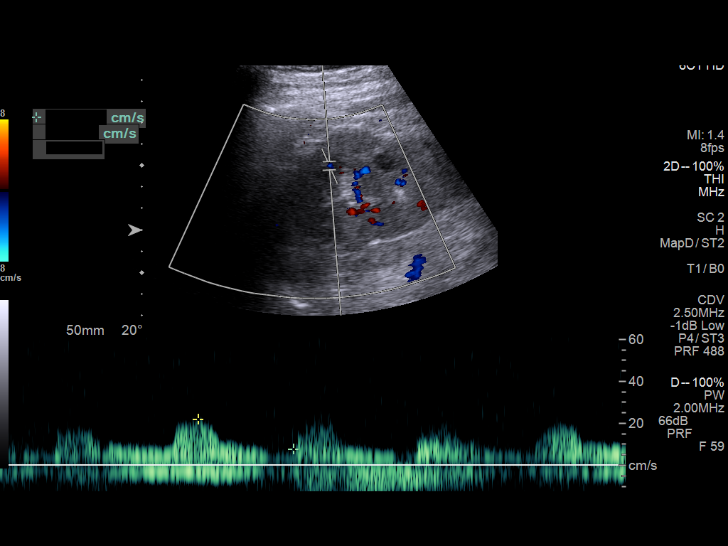
[im 37/80]
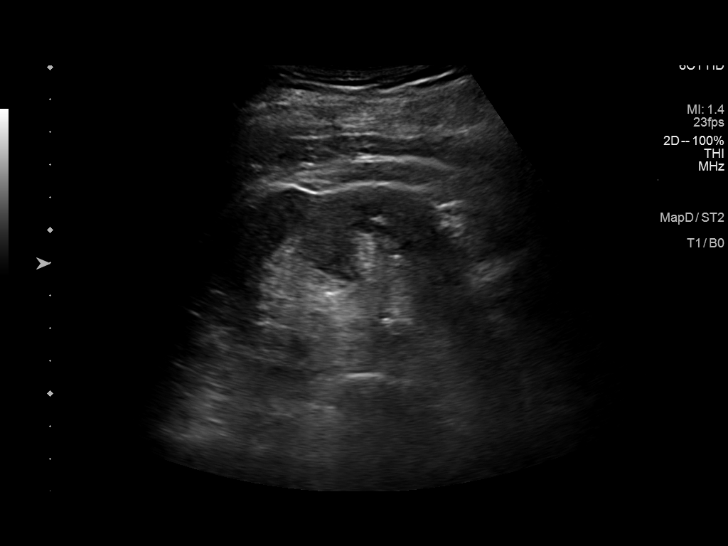
[im 43/80]
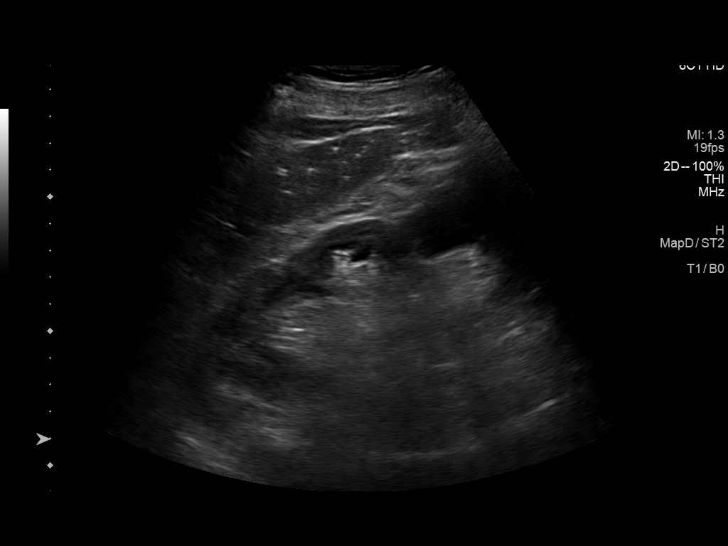
[im 50/80]
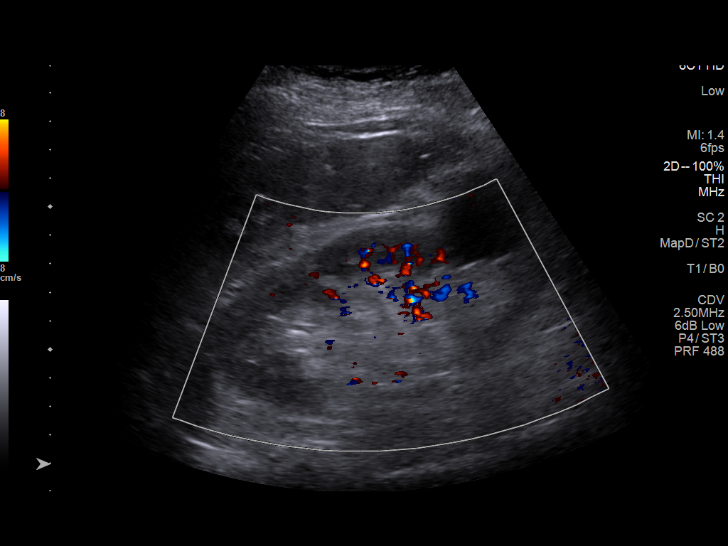
[im 53/80]
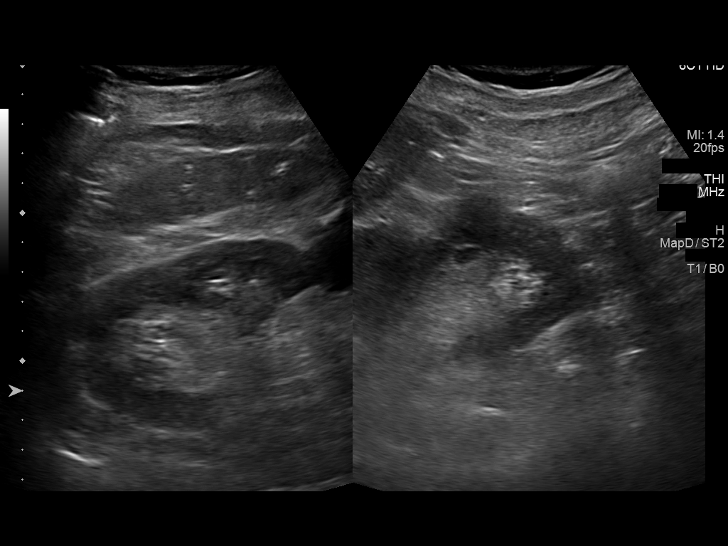
[im 60/80]
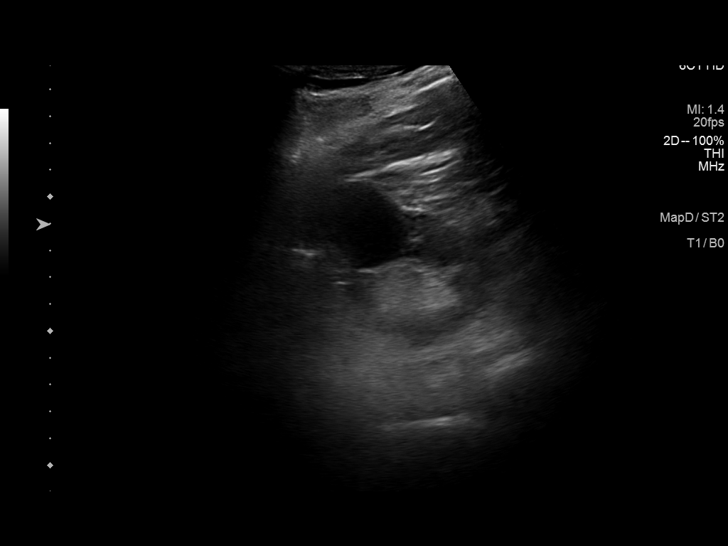
[im 66/80]
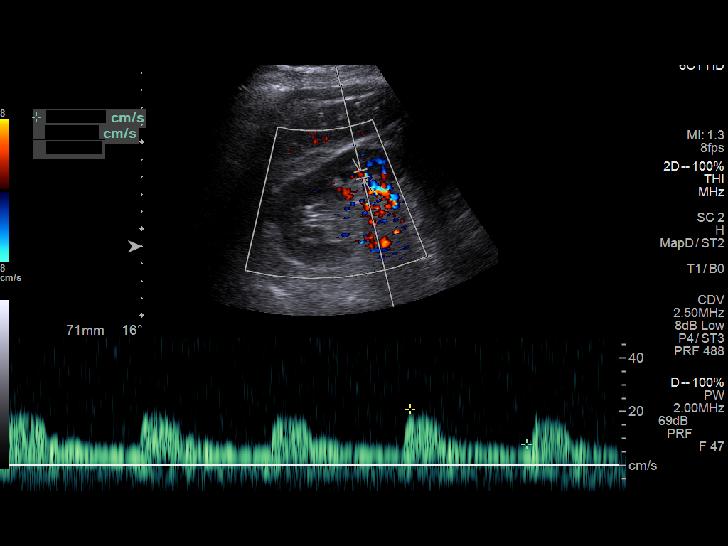
[im 73/80]
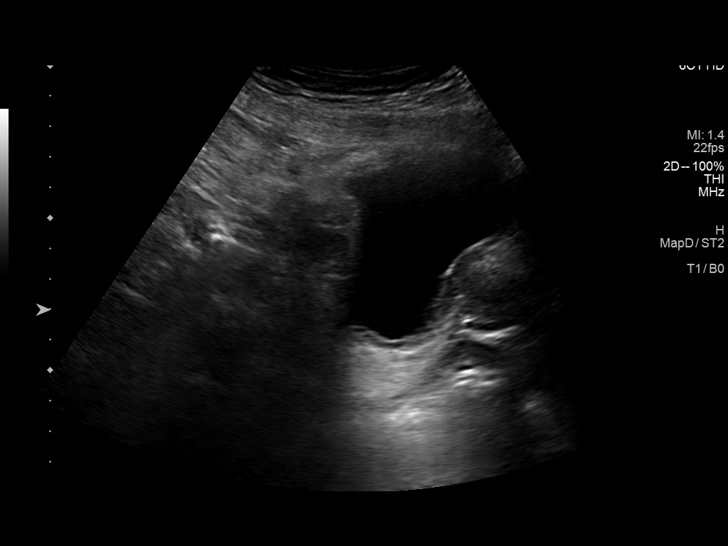
[im 80/80]
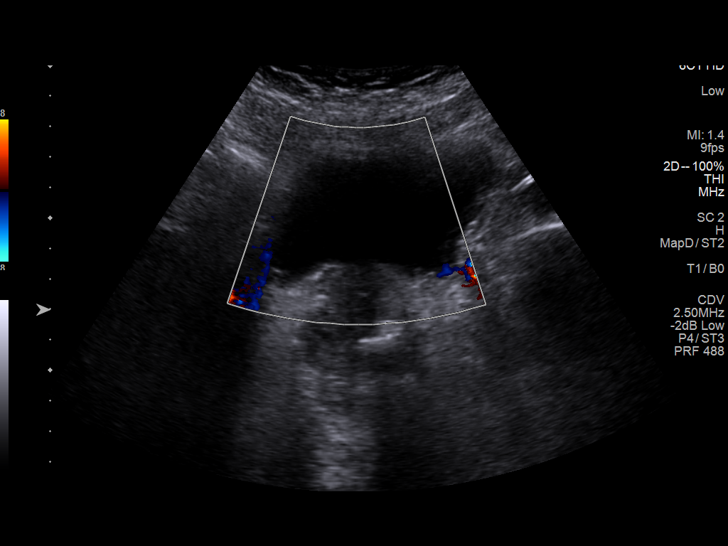

[14 of 25 positions shown; findings below may reference images not displayed]

FINDINGS: Right Kidney:

Length: 11.9 cm. Echogenicity within normal limits. Unchanged simple
cortical cyst in the midpole measuring up to 1.3 cm. No solid mass
or hydronephrosis visualized.

Left Kidney:

Length: 13.0 cm. Echogenicity within normal limits. Mesophytic cyst
in the inferior pole measuring up to 3.7 cm, previously 2.0 cm. No
solid mass or hydronephrosis visualized.

Bladder:  No bladder wall thickening.

RENAL DUPLEX ULTRASOUND

Right Renal Artery Velocities:

Origin:  79 cm/sec

Mid:  70 cm/sec

Hilum:  60 cm/sec

Interlobar:  32 cm/sec

Arcuate:  23 cm/sec

Left Renal Artery Velocities:

Origin:  89 cm/sec

Mid:  74 cm/sec

Hilum:  71 cm/sec

Interlobar:  36 cm/sec

Arcuate:  21 cm/sec

Aortic Velocity:  109 cm/sec

Right Renal-Aortic Ratios:

Origin: Wall

Mid:

Hilum:

Interlobar:

Arcuate:

Left Renal-Aortic Ratios:

Origin:

Mid:

Hilum:

Interlobar:

Arcuate:
IMPRESSION: No evidence of hemodynamically significant renal artery stenosis.

## 2021-06-11 ENCOUNTER — Other Ambulatory Visit: Payer: Self-pay

## 2021-06-11 DIAGNOSIS — G8929 Other chronic pain: Secondary | ICD-10-CM

## 2021-06-11 MED ORDER — HYDROCODONE-ACETAMINOPHEN 7.5-325 MG PO TABS
1.0000 | ORAL_TABLET | Freq: Three times a day (TID) | ORAL | 0 refills | Status: DC | PRN
Start: 2021-06-11 — End: 2021-08-23

## 2021-06-11 NOTE — Telephone Encounter (Signed)
Pt asks for a refill on Norco to [facility].

## 2021-06-24 ENCOUNTER — Other Ambulatory Visit: Payer: Self-pay

## 2021-06-24 DIAGNOSIS — F411 Generalized anxiety disorder: Secondary | ICD-10-CM

## 2021-06-24 DIAGNOSIS — F4321 Adjustment disorder with depressed mood: Secondary | ICD-10-CM

## 2021-06-24 MED ORDER — ALPRAZOLAM 0.25 MG PO TABS
ORAL_TABLET | ORAL | 1 refills | Status: DC
Start: 2021-06-24 — End: 2021-09-07

## 2021-06-24 NOTE — Telephone Encounter (Signed)
Pt called stating he needs alprazolam refill sent to [facility] on [address] in [place].

## 2021-07-12 ENCOUNTER — Telehealth: Payer: Self-pay | Admitting: Family Medicine

## 2021-07-12 DIAGNOSIS — M1000 Idiopathic gout, unspecified site: Secondary | ICD-10-CM

## 2021-07-12 DIAGNOSIS — I10 Essential (primary) hypertension: Secondary | ICD-10-CM

## 2021-07-12 DIAGNOSIS — E785 Hyperlipidemia, unspecified: Secondary | ICD-10-CM

## 2021-07-12 NOTE — Telephone Encounter (Signed)
PT states he would like for all scripts to be sent over to a local pharm  [facility] #[id number] [place], [place] - [address] AT [facility] OF [facility] & [address] Phone:  [phone]  Fax:  [phone]

## 2021-07-13 MED ORDER — ROSUVASTATIN CALCIUM 20 MG PO TABS: 20.0000 mg | ORAL_TABLET | Freq: Every day | ORAL | 3 refills | Status: DC

## 2021-07-13 MED ORDER — ALLOPURINOL 300 MG PO TABS: 300.0000 mg | ORAL_TABLET | Freq: Every day | ORAL | 1 refills | Status: DC

## 2021-07-13 MED ORDER — AMLODIPINE BESYLATE 5 MG PO TABS: 5.0000 mg | ORAL_TABLET | Freq: Every day | ORAL | 3 refills | Status: DC

## 2021-07-13 MED ORDER — LISINOPRIL 40 MG PO TABS: 40.0000 mg | ORAL_TABLET | Freq: Every day | ORAL | 3 refills | Status: DC

## 2021-07-13 NOTE — Telephone Encounter (Signed)
Medications that are due have been sent to [facility].

## 2021-07-13 NOTE — Telephone Encounter (Signed)
Pt called to f/up on this request.   °

## 2021-07-14 ENCOUNTER — Telehealth: Payer: Self-pay | Admitting: Family Medicine

## 2021-07-14 MED ORDER — GABAPENTIN 300 MG PO CAPS: ORAL_CAPSULE | ORAL | 1 refills | Status: DC

## 2021-07-14 NOTE — Telephone Encounter (Signed)
Pt called to say he switched Pharmacies & now uses  [facility] in [place] .  --Patient ask for Rx refill order for :  gabapentin (NEURONTIN) 300 MG capsule [[id number]]    Order Details Dose, Route, Frequency: As Directed  Dispense Quantity: 240 capsule Refills: 1        Sig: TAKE 3 CAPSULES THREE TIMES DAILY        --Send refill order to:   [facility]ORE #[id number] [place], [place] - [address] AT [facility] OF [facility] & [address] Phone:  [phone]  Fax:  [phone]    --Fausto Skillern

## 2021-07-14 NOTE — Telephone Encounter (Signed)
Refill sent. See meds.  

## 2021-08-23 ENCOUNTER — Other Ambulatory Visit: Payer: Self-pay | Admitting: Family Medicine

## 2021-08-23 DIAGNOSIS — M545 Low back pain, unspecified: Secondary | ICD-10-CM

## 2021-08-23 DIAGNOSIS — G8929 Other chronic pain: Secondary | ICD-10-CM

## 2021-08-23 MED ORDER — HYDROCODONE-ACETAMINOPHEN 7.5-325 MG PO TABS: 1.0000 | ORAL_TABLET | Freq: Three times a day (TID) | ORAL | 0 refills | Status: DC | PRN

## 2021-08-23 NOTE — Telephone Encounter (Signed)
Medication: HYDROcodone-acetaminophen (NORCO) 7.5-325 MG tablet   ALPRAZolam (XANAX) 0.25 MG tablet   Has the patient contacted their pharmacy? No. (If no, request that the patient contact the pharmacy for the refill.) (If yes, when and what did the pharmacy advise?)  Preferred Pharmacy (with phone number or street name):  [facility] #[id number] [place], [place] - [address] AT [facility] OF [facility] & [address]  [address] [place] [address]  Phone:  [phone]    Agent: Please be advised that RX refills may take up to 3 business days. We ask that you follow-up with your pharmacy.

## 2021-09-01 ENCOUNTER — Other Ambulatory Visit: Payer: Self-pay | Admitting: *Deleted

## 2021-09-01 MED ORDER — GABAPENTIN 300 MG PO CAPS: ORAL_CAPSULE | ORAL | 0 refills | Status: DC

## 2021-09-07 ENCOUNTER — Telehealth: Payer: Self-pay | Admitting: Family Medicine

## 2021-09-07 DIAGNOSIS — F411 Generalized anxiety disorder: Secondary | ICD-10-CM

## 2021-09-07 DIAGNOSIS — F4321 Adjustment disorder with depressed mood: Secondary | ICD-10-CM

## 2021-09-07 MED ORDER — ALPRAZOLAM 0.25 MG PO TABS: ORAL_TABLET | ORAL | 1 refills | Status: DC

## 2021-09-07 NOTE — Telephone Encounter (Signed)
Requesting: xanax Contract: none UDS: 02/15/21 Last Visit: 05/03/21 Next Visit: none pending Last Refill: 06/24/2021

## 2021-09-07 NOTE — Telephone Encounter (Signed)
Medication: ALPRAZolam (XANAX) 0.25 MG tablet  Has the patient contacted their pharmacy? Yes.   (If no, request that the patient contact the pharmacy for the refill.) (If yes, when and what did the pharmacy advise?)  Preferred Pharmacy (with phone number or street name): [facility] #[id number] [place], [place] - [address] AT [facility] OF [facility] & [address]  [address] [place] [address]  Phone:  [phone]  Fax:  [phone]   Agent: Please be advised that RX refills may take up to 3 business days. We ask that you follow-up with your pharmacy.

## 2021-09-30 DIAGNOSIS — T387X5A Adverse effect of androgens and anabolic congeners, initial encounter: Secondary | ICD-10-CM | POA: Diagnosis not present

## 2021-09-30 DIAGNOSIS — R339 Retention of urine, unspecified: Secondary | ICD-10-CM | POA: Diagnosis not present

## 2021-09-30 DIAGNOSIS — N138 Other obstructive and reflux uropathy: Secondary | ICD-10-CM | POA: Diagnosis not present

## 2021-09-30 DIAGNOSIS — R300 Dysuria: Secondary | ICD-10-CM | POA: Diagnosis not present

## 2021-09-30 DIAGNOSIS — R3589 Other polyuria: Secondary | ICD-10-CM | POA: Diagnosis not present

## 2021-09-30 DIAGNOSIS — E291 Testicular hypofunction: Secondary | ICD-10-CM | POA: Diagnosis not present

## 2021-09-30 DIAGNOSIS — N401 Enlarged prostate with lower urinary tract symptoms: Secondary | ICD-10-CM | POA: Diagnosis not present

## 2021-09-30 DIAGNOSIS — N529 Male erectile dysfunction, unspecified: Secondary | ICD-10-CM | POA: Diagnosis not present

## 2021-10-04 ENCOUNTER — Telehealth: Payer: Self-pay | Admitting: Family Medicine

## 2021-10-04 DIAGNOSIS — G8929 Other chronic pain: Secondary | ICD-10-CM

## 2021-10-04 MED ORDER — HYDROCODONE-ACETAMINOPHEN 7.5-325 MG PO TABS
1.0000 | ORAL_TABLET | Freq: Three times a day (TID) | ORAL | 0 refills | Status: DC | PRN
Start: 2021-10-04 — End: 2021-11-09

## 2021-10-04 NOTE — Telephone Encounter (Signed)
Pt requested a refill for  HYDROcodone-acetaminophen (NORCO) 7.5-325 MG tablet     [facility]ORE #[id number] [place], [place] - [address] AT [facility] OF [facility] & [address]  [address] [place], [place] [address]  Phone:  [phone]  Fax:  [phone]

## 2021-10-04 NOTE — Telephone Encounter (Signed)
See below

## 2021-10-08 ENCOUNTER — Telehealth: Payer: Self-pay | Admitting: Family Medicine

## 2021-10-08 DIAGNOSIS — F4321 Adjustment disorder with depressed mood: Secondary | ICD-10-CM

## 2021-10-08 DIAGNOSIS — F411 Generalized anxiety disorder: Secondary | ICD-10-CM

## 2021-10-08 MED ORDER — ALPRAZOLAM 0.25 MG PO TABS: ORAL_TABLET | ORAL | 1 refills | Status: DC

## 2021-10-08 NOTE — Telephone Encounter (Signed)
Okay for refill?  

## 2021-10-08 NOTE — Telephone Encounter (Signed)
Medication: ALPRAZolam (XANAX) 0.25 MG tablet   Has the patient contacted their pharmacy? No. (If no, request that the patient contact the pharmacy for the refill.) (If yes, when and what did the pharmacy advise?)  Preferred Pharmacy (with phone number or street name): [facility] #[id number] [place], [place] - [address] AT [facility] OF [facility] & [address]  [address] [place] [address]  Phone:  [phone]  Fax:  [phone]   Agent: Please be advised that RX refills may take up to 3 business days. We ask that you follow-up with your pharmacy.

## 2021-10-15 DIAGNOSIS — E291 Testicular hypofunction: Secondary | ICD-10-CM | POA: Diagnosis not present

## 2021-10-22 DIAGNOSIS — E291 Testicular hypofunction: Secondary | ICD-10-CM | POA: Diagnosis not present

## 2021-10-22 DIAGNOSIS — R339 Retention of urine, unspecified: Secondary | ICD-10-CM | POA: Diagnosis not present

## 2021-11-02 ENCOUNTER — Other Ambulatory Visit: Payer: Self-pay | Admitting: Family Medicine

## 2021-11-09 ENCOUNTER — Telehealth: Payer: Self-pay | Admitting: Family Medicine

## 2021-11-09 DIAGNOSIS — F4321 Adjustment disorder with depressed mood: Secondary | ICD-10-CM

## 2021-11-09 DIAGNOSIS — F411 Generalized anxiety disorder: Secondary | ICD-10-CM

## 2021-11-09 DIAGNOSIS — G8929 Other chronic pain: Secondary | ICD-10-CM

## 2021-11-09 MED ORDER — ALPRAZOLAM 0.25 MG PO TABS: ORAL_TABLET | ORAL | 0 refills | Status: DC

## 2021-11-09 MED ORDER — HYDROCODONE-ACETAMINOPHEN 7.5-325 MG PO TABS: 1.0000 | ORAL_TABLET | Freq: Three times a day (TID) | ORAL | 0 refills | Status: DC | PRN

## 2021-11-09 NOTE — Telephone Encounter (Signed)
Okay for refills.

## 2021-11-09 NOTE — Telephone Encounter (Signed)
Called pt-he is due for an office visit.  Scheduled an appointment for December 19

## 2021-11-09 NOTE — Telephone Encounter (Signed)
Medication:  HYDROcodone-acetaminophen (NORCO) 7.5-325 MG tablet [[id number] ALPRAZolam (XANAX) 0.25 MG tablet [[id number]]     Has the patient contacted their pharmacy? No. (If no, request that the patient contact the pharmacy for the refill.) (If yes, when and what did the pharmacy advise?)     Preferred Pharmacy (with phone number or street name):  [facility] #[id number] [place], [place] - [address] AT [facility] OF [facility] & [address]  [address] [place] [address]  Phone:  [phone]  Fax:  [phone]     Agent: Please be advised that RX refills may take up to 3 business days. We ask that you follow-up with your pharmacy.

## 2021-11-23 ENCOUNTER — Telehealth: Payer: Self-pay | Admitting: Family Medicine

## 2021-11-23 ENCOUNTER — Other Ambulatory Visit: Payer: Self-pay | Admitting: Family Medicine

## 2021-11-28 NOTE — Progress Notes (Addendum)
[facility] at [facility] [address], [address] [place], [place] [address] [phone]  Date:  11/29/2021   Name:  KARY COLAIZZI   DOB:  10-02-1956   MRN:  [id number]  PCP:  Pearline Cables, MD    Chief Complaint: Medical Management of Chronic Issues (Follow up and he gets off balance 2-3 times a week. )   History of Present Illness:  GENARO BEKKER is a 65 y.o. very pleasant male patient who presents with the following:  Patient seen today for follow-up visit Most recently seen by myself in May of this year- history of anxiety, diverticulitis, neuropathy, hypertension, chronic joint pain, heavy tobacco use He has neuropathy and lower limb claudication-much worse with standing or walking. He works in [employer], [employer] and is able to do this work seated  He is treated with narcotics for chronic pain; I am prescribing hydrocodone 7.5, number 70 tablets monthly Gabapentin 300 mg 3 times daily He is also taking alprazolam for anxiety, 0.25 mg 45/month  UDS is up-to-date, March of this year He is also seen urology, most recent visit in November- they are working on his testosterone; he does feel like this is helping him to feel better He is using gel and a pill or some sort  He is following up every 3 months   Shingles vaccine COVID booster- recommend  Can give flu shot if not done already-  give today  Cologuard is up-to-date Update blood work today.  In March his GFR had dropped  He does see nephrology, he had a visit with Dr. Thedore Mins in May-[facility]  There was a concern about his sternum seen in his lung cancer screening CT earlier this year-bone scan was ordered and discussed with him on 2 occasions but I do not think this was ever done- asked pt about this again   Asked pt about this again- he does not wish to do any follow-up of sternal lesion at this time    Albuterol Allopurinol Alprazolam Amlodipine Gabapentin Hydrocodone acetaminophen Lisinopril Crestor 20 Patient Active Problem List   Diagnosis Date Noted   Cough 12/20/2020   Acute exacerbation of chronic obstructive pulmonary disease (COPD) (HCC) 12/20/2020   Neuropathy 06/04/2019   Alcoholism (HCC) 11/19/2018   Essential hypertension 06/06/2016   Smoker 05/12/2016   Left inguinal hernia 05/23/2014   Anxiety state, unspecified 09/17/2013   Gout 01/17/2013   DIVERTICULOSIS-COLON 08/13/2010   DIVERTICULITIS, COLON 08/13/2010   ABDOMINAL PAIN-LLQ 08/13/2010   Nonspecific (abnormal) findings on radiological and other examination of body structure 08/13/2010   PERSONAL HX COLONIC POLYPS 08/13/2010   NONSPCIFC ABN FINDING RAD & OTH EXAM LUNG FIELD 08/13/2010    Past Medical History:  Diagnosis Date   Alcoholism (HCC)    Allergy    Anxiety    COPD (chronic obstructive pulmonary disease) (HCC)    Diverticulitis 2011   resulted in partial colectomy   Essential hypertension 06/06/2016   Full dentures    Gout    Hematuria    Wears glasses     Past Surgical History:  Procedure Laterality Date   ABDOMINAL AORTOGRAM W/LOWER EXTREMITY Bilateral 07/19/2019   Procedure: ABDOMINAL AORTOGRAM W/LOWER EXTREMITY;  Surgeon: Sherren Kerns, MD;  Location: [facility];  Service: Cardiovascular;  Laterality: Bilateral;   COLON SURGERY  2011   partial colectomy   COLONOSCOPY     FINGER ARTHROPLASTY  1990   rt index  fx   HERNIA REPAIR     INGUINAL HERNIA REPAIR Left 05/27/2014   Procedure: LEFT INGUINAL HERNIA REPAIR WITH MESH;  Surgeon: Imogene Burn. Georgette Dover, MD;  Location: [facility];  Service: General;  Laterality: Left;   INSERTION OF MESH Left 05/27/2014   Procedure: INSERTION OF MESH;  Surgeon: Imogene Burn. Georgette Dover, MD;  Location: [facility];  Service: General;  Laterality: Left;    Social History   Tobacco Use   Smoking status: Every Day     Packs/day: 2.00    Types: Cigarettes   Smokeless tobacco: Never  Vaping Use   Vaping Use: Never used  Substance Use Topics   Alcohol use: Yes    Alcohol/week: 21.0 standard drinks    Types: 21 Cans of beer per week    Comment: about 3 beers daily- history of heavy liquor abuse   Drug use: No    Family History  Problem Relation Age of Onset   Alzheimer's disease Mother    Cancer Sister    Neuropathy Neg Hx     Allergies  Allergen Reactions   Ibuprofen Swelling    Medication list has been reviewed and updated.  Current Outpatient Medications on File Prior to Visit  Medication Sig Dispense Refill   albuterol (VENTOLIN HFA) 108 (90 Base) MCG/ACT inhaler Inhale 2 puffs into the lungs every 6 (six) hours as needed for wheezing or shortness of breath. 1 Inhaler 6   allopurinol (ZYLOPRIM) 300 MG tablet Take 1 tablet (300 mg total) by mouth daily. 90 tablet 1   ALPRAZolam (XANAX) 0.25 MG tablet TAKE 1 TABLET SCHEDULED EVERY MORNING AND MAY REPEAT IN THE EVENING AS NEEDED FOR ANXIETY office visit needed 45 tablet 0   amLODipine (NORVASC) 5 MG tablet Take 1 tablet (5 mg total) by mouth daily. 90 tablet 3   ascorbic Acid (VITAMIN C) 500 MG CPCR Take 500 mg by mouth daily.     Cholecalciferol (VITAMIN D3) 50 MCG (2000 UT) TABS Take by mouth.     diphenhydrAMINE HCl, Sleep, (SLEEP-AID) 50 MG CAPS Take 50 mg by mouth at bedtime.     gabapentin (NEURONTIN) 300 MG capsule TAKE 3 CAPSULES BY MOUTH THREE TIMES DAILY 240 capsule 0   HYDROcodone-acetaminophen (NORCO) 7.5-325 MG tablet Take 1 tablet by mouth every 8 (eight) hours as needed for moderate pain. 70 tablet 0   lisinopril (ZESTRIL) 40 MG tablet Take 1 tablet (40 mg total) by mouth daily. 90 tablet 3   rosuvastatin (CRESTOR) 20 MG tablet Take 1 tablet (20 mg total) by mouth daily. 90 tablet 3   triamcinolone cream (KENALOG) 0.1 % Apply 1 application topically 2 (two) times daily as needed (eczema on leg). 90 g 2   No current  facility-administered medications on file prior to visit.    Review of Systems:  As per HPI- otherwise negative.   Physical Examination: Vitals:   11/29/21 1544  BP: 140/60  Pulse: 94  Temp: 98 F (36.7 C)  SpO2: 96%   Vitals:   11/29/21 1544  Weight: 173 lb (78.5 kg)  Height: 5\' 11"  (1.803 m)   Body mass index is 24.13 kg/m. Ideal Body Weight: Weight in (lb) to have BMI = 25: 178.9  GEN: no acute distress. Normal weight, looks well  HEENT: Atraumatic, Normocephalic.  Ears and Nose: No external deformity. CV: RRR, No M/G/R. No JVD. No thrill. No extra heart sounds. PULM: CTA B, no wheezes, crackles, rhonchi. No retractions. No resp.  distress. No accessory muscle use. ABD: S, NT, ND, +BS. No rebound. No HSM. EXTR: No c/c/e PSYCH: Normally interactive. Conversant.     Assessment and Plan: GAD (generalized anxiety disorder) - Plan: ALPRAZolam (XANAX) 0.25 MG tablet  Essential hypertension - Plan: CBC, Basic metabolic panel  Idiopathic gout, unspecified chronicity, unspecified site - Plan: allopurinol (ZYLOPRIM) 300 MG tablet  Accelerated hypertension  Dyslipidemia  Chronic renal impairment, unspecified CKD stage - Plan: Basic metabolic panel  Neuropathy  Chronic bilateral low back pain without sciatica - Plan: DRUG MONITORING, PANEL 8 WITH CONFIRMATION, URINE, HYDROcodone-acetaminophen (NORCO) 7.5-325 MG tablet  Grief - Plan: ALPRAZolam (XANAX) 0.25 MG tablet  Need for immunization against influenza - Plan: Flu Vaccine QUAD High Dose(Fluad) Seen today for follow-up Refilled medications as above Flu vaccine  BP is controlled, continue medications  Continue crestor for lipids  Will plan further follow- up pending labs. Visit in 6 months   Signed Lamar Blinks, MD  Received labs as below 12/20- called his nephrologist who kindly spoke to me on the phone and who recommended lokelma 5gm QOD for several days until recheck Called patient and discussed.   Explained medication, I ordered a repeat BMP and asked him to schedule a lab visit with [place] within 7 to 14 days; he is not 100% sure of his holiday plans so he will schedule his lab visit Recommended he continue to follow-up with nephrology as planned  Mild anemia is stable Patient did Cologuard in 2020, negative Results for orders placed or performed in visit on 11/29/21  CBC  Result Value Ref Range   WBC 5.5 4.0 - 10.5 K/uL   RBC 3.56 (L) 4.22 - 5.81 Mil/uL   Platelets 150.0 150.0 - 400.0 K/uL   Hemoglobin 12.4 (L) 13.0 - 17.0 g/dL   HCT 36.7 (L) 39.0 - 52.0 %   MCV 103.0 (H) 78.0 - 100.0 fl   MCHC 33.8 30.0 - 36.0 g/dL   RDW 13.8 11.5 - [id number] %  Basic metabolic panel  Result Value Ref Range   Sodium 140 135 - 145 mEq/L   Potassium 5.7 (H) 3.5 - 5.1 mEq/L   Chloride 108 96 - 112 mEq/L   CO2 25 19 - 32 mEq/L   Glucose, Bld 95 70 - 99 mg/dL   BUN 27 (H) 6 - 23 mg/dL   Creatinine, Ser 1.91 (H) 0.40 - 1.50 mg/dL   GFR 35.77 (L) >60.00 mL/min   Calcium 9.1 8.4 - 10.5 mg/dL

## 2021-11-29 ENCOUNTER — Ambulatory Visit (INDEPENDENT_AMBULATORY_CARE_PROVIDER_SITE_OTHER): Payer: Medicare HMO | Admitting: Family Medicine

## 2021-11-29 ENCOUNTER — Encounter: Payer: Self-pay | Admitting: Family Medicine

## 2021-11-29 VITALS — BP 140/60 | HR 94 | Temp 98.0°F | Ht 71.0 in | Wt 173.0 lb

## 2021-11-29 DIAGNOSIS — E785 Hyperlipidemia, unspecified: Secondary | ICD-10-CM | POA: Diagnosis not present

## 2021-11-29 DIAGNOSIS — D649 Anemia, unspecified: Secondary | ICD-10-CM

## 2021-11-29 DIAGNOSIS — G629 Polyneuropathy, unspecified: Secondary | ICD-10-CM | POA: Diagnosis not present

## 2021-11-29 DIAGNOSIS — N189 Chronic kidney disease, unspecified: Secondary | ICD-10-CM | POA: Diagnosis not present

## 2021-11-29 DIAGNOSIS — I10 Essential (primary) hypertension: Secondary | ICD-10-CM | POA: Diagnosis not present

## 2021-11-29 DIAGNOSIS — F4321 Adjustment disorder with depressed mood: Secondary | ICD-10-CM

## 2021-11-29 DIAGNOSIS — M545 Low back pain, unspecified: Secondary | ICD-10-CM | POA: Diagnosis not present

## 2021-11-29 DIAGNOSIS — Z23 Encounter for immunization: Secondary | ICD-10-CM

## 2021-11-29 DIAGNOSIS — M1000 Idiopathic gout, unspecified site: Secondary | ICD-10-CM | POA: Diagnosis not present

## 2021-11-29 DIAGNOSIS — G8929 Other chronic pain: Secondary | ICD-10-CM

## 2021-11-29 DIAGNOSIS — F411 Generalized anxiety disorder: Secondary | ICD-10-CM | POA: Diagnosis not present

## 2021-11-29 DIAGNOSIS — E875 Hyperkalemia: Secondary | ICD-10-CM

## 2021-11-29 MED ORDER — ALLOPURINOL 300 MG PO TABS: 300.0000 mg | ORAL_TABLET | Freq: Every day | ORAL | 3 refills | Status: DC

## 2021-11-29 MED ORDER — ALPRAZOLAM 0.25 MG PO TABS: ORAL_TABLET | ORAL | 3 refills | Status: DC

## 2021-11-29 MED ORDER — HYDROCODONE-ACETAMINOPHEN 7.5-325 MG PO TABS: 1.0000 | ORAL_TABLET | Freq: Three times a day (TID) | ORAL | 0 refills | Status: DC | PRN

## 2021-11-29 NOTE — Patient Instructions (Signed)
It was good to see you again today- please see me in about 6 months assuming all is well

## 2021-11-30 ENCOUNTER — Encounter: Payer: Self-pay | Admitting: Family Medicine

## 2021-11-30 LAB — CBC
HCT: 36.7 % — ABNORMAL LOW (ref 39.0–52.0)
Hemoglobin: 12.4 g/dL — ABNORMAL LOW (ref 13.0–17.0)
MCHC: 33.8 g/dL (ref 30.0–36.0)
MCV: 103 fl — ABNORMAL HIGH (ref 78.0–100.0)
Platelets: 150 10*3/uL (ref 150.0–400.0)
RBC: 3.56 Mil/uL — ABNORMAL LOW (ref 4.22–5.81)
RDW: 13.8 % (ref 11.5–15.5)
WBC: 5.5 10*3/uL (ref 4.0–10.5)

## 2021-11-30 LAB — BASIC METABOLIC PANEL
BUN: 27 mg/dL — ABNORMAL HIGH (ref 6–23)
CO2: 25 mEq/L (ref 19–32)
Calcium: 9.1 mg/dL (ref 8.4–10.5)
Chloride: 108 mEq/L (ref 96–112)
Creatinine, Ser: 1.91 mg/dL — ABNORMAL HIGH (ref 0.40–1.50)
GFR: 35.77 mL/min — ABNORMAL LOW (ref >60.00)
Glucose, Bld: 95 mg/dL (ref 70–99)
Potassium: 5.7 mEq/L — ABNORMAL HIGH (ref 3.5–5.1)
Sodium: 140 mEq/L (ref 135–145)

## 2021-11-30 MED ORDER — LOKELMA 5 G PO PACK: 5.0000 g | PACK | ORAL | 1 refills | Status: DC

## 2021-11-30 NOTE — Addendum Note (Signed)
Addended by: Abbe Amsterdam C on: 11/30/2021 05:53 PM   Modules accepted: Orders

## 2021-12-02 LAB — DRUG MONITORING, PANEL 8 WITH CONFIRMATION, URINE
6 Acetylmorphine: NEGATIVE ng/mL (ref <10)
Alcohol Metabolites: POSITIVE ng/mL — AB (ref <500)
Alphahydroxyalprazolam: 45 ng/mL — ABNORMAL HIGH (ref <25)
Alphahydroxymidazolam: NEGATIVE ng/mL (ref <50)
Alphahydroxytriazolam: NEGATIVE ng/mL (ref <50)
Aminoclonazepam: NEGATIVE ng/mL (ref <25)
Amphetamines: NEGATIVE ng/mL (ref <500)
Benzodiazepines: POSITIVE ng/mL — AB (ref <100)
Buprenorphine, Urine: NEGATIVE ng/mL (ref <5)
Cocaine Metabolite: NEGATIVE ng/mL (ref <150)
Codeine: NEGATIVE ng/mL (ref <50)
Creatinine: 104.5 mg/dL (ref >=20.0)
Ethyl Glucuronide (ETG): >10000 ng/mL — ABNORMAL HIGH (ref <500)
Ethyl Sulfate (ETS): >10000 ng/mL — ABNORMAL HIGH (ref <100)
Hydrocodone: 409 ng/mL — ABNORMAL HIGH (ref <50)
Hydromorphone: 141 ng/mL — ABNORMAL HIGH (ref <50)
Hydroxyethylflurazepam: NEGATIVE ng/mL (ref <50)
Lorazepam: NEGATIVE ng/mL (ref <50)
MDMA: NEGATIVE ng/mL (ref <500)
Marijuana Metabolite: NEGATIVE ng/mL (ref <20)
Morphine: NEGATIVE ng/mL (ref <50)
Nordiazepam: NEGATIVE ng/mL (ref <50)
Norhydrocodone: 319 ng/mL — ABNORMAL HIGH (ref <50)
Opiates: POSITIVE ng/mL — AB (ref <100)
Oxazepam: NEGATIVE ng/mL (ref <50)
Oxidant: NEGATIVE ug/mL (ref <200)
Oxycodone: NEGATIVE ng/mL (ref <100)
Temazepam: NEGATIVE ng/mL (ref <50)
pH: 6.4 (ref 4.5–9.0)

## 2021-12-02 LAB — DM TEMPLATE

## 2021-12-14 NOTE — Addendum Note (Signed)
Addended by: Mervin Kung A on: 12/14/2021 03:55 PM   Modules accepted: Orders

## 2021-12-15 ENCOUNTER — Other Ambulatory Visit (INDEPENDENT_AMBULATORY_CARE_PROVIDER_SITE_OTHER): Payer: Medicare PPO

## 2021-12-15 DIAGNOSIS — E875 Hyperkalemia: Secondary | ICD-10-CM | POA: Diagnosis not present

## 2021-12-15 DIAGNOSIS — D649 Anemia, unspecified: Secondary | ICD-10-CM

## 2021-12-15 NOTE — Addendum Note (Signed)
Addended by: Mervin Kung A on: 12/15/2021 03:21 PM   Modules accepted: Orders

## 2021-12-16 ENCOUNTER — Encounter: Payer: Self-pay | Admitting: Family Medicine

## 2021-12-16 LAB — BASIC METABOLIC PANEL WITH GFR
BUN: 21 mg/dL (ref 6–23)
CO2: 29 meq/L (ref 19–32)
Calcium: 10.1 mg/dL (ref 8.4–10.5)
Chloride: 107 meq/L (ref 96–112)
Creatinine, Ser: 1.66 mg/dL — ABNORMAL HIGH (ref 0.40–1.50)
GFR: 42.32 mL/min — ABNORMAL LOW
Glucose, Bld: 105 mg/dL — ABNORMAL HIGH (ref 70–99)
Potassium: 4.3 meq/L (ref 3.5–5.1)
Sodium: 142 meq/L (ref 135–145)

## 2022-01-06 ENCOUNTER — Other Ambulatory Visit: Payer: Self-pay

## 2022-01-06 DIAGNOSIS — L989 Disorder of the skin and subcutaneous tissue, unspecified: Secondary | ICD-10-CM

## 2022-01-06 MED ORDER — TRIAMCINOLONE ACETONIDE 0.1 % EX CREA: 1.0000 "application " | TOPICAL_CREAM | Freq: Two times a day (BID) | CUTANEOUS | 2 refills | Status: DC | PRN

## 2022-01-17 ENCOUNTER — Emergency Department (HOSPITAL_COMMUNITY): Payer: Medicare PPO

## 2022-01-17 ENCOUNTER — Inpatient Hospital Stay (HOSPITAL_COMMUNITY)
Admission: EM | Admit: 2022-01-17 | Discharge: 2022-01-19 | DRG: 494 | Disposition: A | Discharge status: Home Health | Payer: Medicare PPO | Attending: Internal Medicine | Admitting: Internal Medicine

## 2022-01-17 ENCOUNTER — Encounter (HOSPITAL_COMMUNITY): Payer: Self-pay

## 2022-01-17 ENCOUNTER — Other Ambulatory Visit: Payer: Self-pay

## 2022-01-17 DIAGNOSIS — Z66 Do not resuscitate: Secondary | ICD-10-CM | POA: Diagnosis not present

## 2022-01-17 DIAGNOSIS — S82231A Displaced oblique fracture of shaft of right tibia, initial encounter for closed fracture: Secondary | ICD-10-CM | POA: Diagnosis not present

## 2022-01-17 DIAGNOSIS — Z886 Allergy status to analgesic agent status: Secondary | ICD-10-CM

## 2022-01-17 DIAGNOSIS — Z809 Family history of malignant neoplasm, unspecified: Secondary | ICD-10-CM

## 2022-01-17 DIAGNOSIS — I10 Essential (primary) hypertension: Secondary | ICD-10-CM | POA: Diagnosis present

## 2022-01-17 DIAGNOSIS — M25571 Pain in right ankle and joints of right foot: Secondary | ICD-10-CM | POA: Diagnosis not present

## 2022-01-17 DIAGNOSIS — Z720 Tobacco use: Secondary | ICD-10-CM | POA: Diagnosis not present

## 2022-01-17 DIAGNOSIS — F419 Anxiety disorder, unspecified: Secondary | ICD-10-CM | POA: Diagnosis present

## 2022-01-17 DIAGNOSIS — R000 Tachycardia, unspecified: Secondary | ICD-10-CM | POA: Diagnosis not present

## 2022-01-17 DIAGNOSIS — Z20822 Contact with and (suspected) exposure to covid-19: Secondary | ICD-10-CM | POA: Diagnosis not present

## 2022-01-17 DIAGNOSIS — M79604 Pain in right leg: Secondary | ICD-10-CM | POA: Diagnosis not present

## 2022-01-17 DIAGNOSIS — G8929 Other chronic pain: Secondary | ICD-10-CM | POA: Diagnosis not present

## 2022-01-17 DIAGNOSIS — E785 Hyperlipidemia, unspecified: Secondary | ICD-10-CM | POA: Diagnosis not present

## 2022-01-17 DIAGNOSIS — Y92013 Bedroom of single-family (private) house as the place of occurrence of the external cause: Secondary | ICD-10-CM | POA: Diagnosis not present

## 2022-01-17 DIAGNOSIS — F17210 Nicotine dependence, cigarettes, uncomplicated: Secondary | ICD-10-CM | POA: Diagnosis present

## 2022-01-17 DIAGNOSIS — Z419 Encounter for procedure for purposes other than remedying health state, unspecified: Secondary | ICD-10-CM

## 2022-01-17 DIAGNOSIS — Z043 Encounter for examination and observation following other accident: Secondary | ICD-10-CM | POA: Diagnosis not present

## 2022-01-17 DIAGNOSIS — F1020 Alcohol dependence, uncomplicated: Secondary | ICD-10-CM | POA: Diagnosis present

## 2022-01-17 DIAGNOSIS — Z79899 Other long term (current) drug therapy: Secondary | ICD-10-CM

## 2022-01-17 DIAGNOSIS — W06XXXA Fall from bed, initial encounter: Secondary | ICD-10-CM | POA: Diagnosis present

## 2022-01-17 DIAGNOSIS — J449 Chronic obstructive pulmonary disease, unspecified: Secondary | ICD-10-CM | POA: Diagnosis not present

## 2022-01-17 DIAGNOSIS — Z820 Family history of epilepsy and other diseases of the nervous system: Secondary | ICD-10-CM | POA: Diagnosis not present

## 2022-01-17 DIAGNOSIS — Z789 Other specified health status: Secondary | ICD-10-CM | POA: Diagnosis present

## 2022-01-17 DIAGNOSIS — S82401A Unspecified fracture of shaft of right fibula, initial encounter for closed fracture: Secondary | ICD-10-CM

## 2022-01-17 DIAGNOSIS — M109 Gout, unspecified: Secondary | ICD-10-CM | POA: Diagnosis present

## 2022-01-17 DIAGNOSIS — S82201A Unspecified fracture of shaft of right tibia, initial encounter for closed fracture: Secondary | ICD-10-CM | POA: Diagnosis not present

## 2022-01-17 DIAGNOSIS — W19XXXA Unspecified fall, initial encounter: Secondary | ICD-10-CM | POA: Diagnosis not present

## 2022-01-17 DIAGNOSIS — S82831A Other fracture of upper and lower end of right fibula, initial encounter for closed fracture: Secondary | ICD-10-CM | POA: Diagnosis not present

## 2022-01-17 HISTORY — DX: Dyspnea, unspecified: R06.00

## 2022-01-17 HISTORY — DX: Unspecified osteoarthritis, unspecified site: M19.90

## 2022-01-17 HISTORY — DX: Personal history of urinary calculi: Z87.442

## 2022-01-17 LAB — HEPATIC FUNCTION PANEL
ALT: 14 U/L (ref 0–44)
AST: 24 U/L (ref 15–41)
Albumin: 4 g/dL (ref 3.5–5.0)
Alkaline Phosphatase: 53 U/L (ref 38–126)
Bilirubin, Direct: 0.2 mg/dL (ref 0.0–0.2)
Indirect Bilirubin: 0.6 mg/dL (ref 0.3–0.9)
Total Bilirubin: 0.8 mg/dL (ref 0.3–1.2)
Total Protein: 7.2 g/dL (ref 6.5–8.1)

## 2022-01-17 LAB — RESP PANEL BY RT-PCR (FLU A&B, COVID) ARPGX2
Influenza A by PCR: NEGATIVE
Influenza B by PCR: NEGATIVE
SARS Coronavirus 2 by RT PCR: NEGATIVE

## 2022-01-17 LAB — BASIC METABOLIC PANEL
Anion gap: 8 (ref 5–15)
BUN: 17 mg/dL (ref 8–23)
CO2: 24 mmol/L (ref 22–32)
Calcium: 8.8 mg/dL — ABNORMAL LOW (ref 8.9–10.3)
Chloride: 105 mmol/L (ref 98–111)
Creatinine, Ser: 1.01 mg/dL (ref 0.61–1.24)
GFR, Estimated: >60 mL/min (ref >60)
Glucose, Bld: 99 mg/dL (ref 70–99)
Potassium: 4.3 mmol/L (ref 3.5–5.1)
Sodium: 137 mmol/L (ref 135–145)

## 2022-01-17 LAB — CBC WITH DIFFERENTIAL/PLATELET
Abs Immature Granulocytes: 0.02 10*3/uL (ref 0.00–0.07)
Basophils Absolute: 0 10*3/uL (ref 0.0–0.1)
Basophils Relative: 0 %
Eosinophils Absolute: 0 10*3/uL (ref 0.0–0.5)
Eosinophils Relative: 0 %
HCT: 34.5 % — ABNORMAL LOW (ref 39.0–52.0)
Hemoglobin: 12.2 g/dL — ABNORMAL LOW (ref 13.0–17.0)
Immature Granulocytes: 0 %
Lymphocytes Relative: 22 %
Lymphs Abs: 1.5 10*3/uL (ref 0.7–4.0)
MCH: 35.3 pg — ABNORMAL HIGH (ref 26.0–34.0)
MCHC: 35.4 g/dL (ref 30.0–36.0)
MCV: 99.7 fL (ref 80.0–100.0)
Monocytes Absolute: 0.6 10*3/uL (ref 0.1–1.0)
Monocytes Relative: 8 %
Neutro Abs: 4.9 10*3/uL (ref 1.7–7.7)
Neutrophils Relative %: 70 %
Platelets: 125 10*3/uL — ABNORMAL LOW (ref 150–400)
RBC: 3.46 MIL/uL — ABNORMAL LOW (ref 4.22–5.81)
RDW: 12.8 % (ref 11.5–15.5)
WBC: 7 10*3/uL (ref 4.0–10.5)
nRBC: 0 % (ref 0.0–0.2)

## 2022-01-17 MED ORDER — SENNOSIDES-DOCUSATE SODIUM 8.6-50 MG PO TABS: 1.0000 | ORAL_TABLET | Freq: Every evening | ORAL | Status: DC | PRN

## 2022-01-17 MED ORDER — HYDROMORPHONE HCL 1 MG/ML IJ SOLN
1.0000 mg | INTRAMUSCULAR | Status: DC | PRN
  Administered 2022-01-18 (×3): 1 mg via INTRAVENOUS
  Filled 2022-01-17 (×3): qty 1

## 2022-01-17 MED ORDER — IPRATROPIUM-ALBUTEROL 0.5-2.5 (3) MG/3ML IN SOLN: 3.0000 mL | Freq: Once | RESPIRATORY_TRACT | Status: DC

## 2022-01-17 MED ORDER — MELATONIN 3 MG PO TABS
3.0000 mg | ORAL_TABLET | Freq: Every evening | ORAL | Status: DC | PRN
  Administered 2022-01-18: 3 mg via ORAL
  Filled 2022-01-17: qty 1

## 2022-01-17 MED ORDER — ACETAMINOPHEN 650 MG RE SUPP: 650.0000 mg | Freq: Four times a day (QID) | RECTAL | Status: DC | PRN

## 2022-01-17 MED ORDER — ONDANSETRON HCL 4 MG PO TABS: 4.0000 mg | ORAL_TABLET | Freq: Four times a day (QID) | ORAL | Status: DC | PRN

## 2022-01-17 MED ORDER — HYDROMORPHONE HCL 1 MG/ML IJ SOLN
1.0000 mg | Freq: Once | INTRAMUSCULAR | Status: AC
  Administered 2022-01-17: 1 mg via INTRAVENOUS
  Filled 2022-01-17: qty 1

## 2022-01-17 MED ORDER — MORPHINE SULFATE (PF) 4 MG/ML IV SOLN
4.0000 mg | Freq: Once | INTRAVENOUS | Status: AC
  Administered 2022-01-17: 4 mg via INTRAVENOUS
  Filled 2022-01-17: qty 1

## 2022-01-17 MED ORDER — ONDANSETRON HCL 4 MG/2ML IJ SOLN
4.0000 mg | Freq: Once | INTRAMUSCULAR | Status: AC
  Administered 2022-01-17: 4 mg via INTRAVENOUS
  Filled 2022-01-17: qty 2

## 2022-01-17 MED ORDER — ACETAMINOPHEN 325 MG PO TABS: 650.0000 mg | ORAL_TABLET | Freq: Four times a day (QID) | ORAL | Status: DC | PRN

## 2022-01-17 MED ORDER — ONDANSETRON HCL 4 MG/2ML IJ SOLN
4.0000 mg | Freq: Four times a day (QID) | INTRAMUSCULAR | Status: DC | PRN
  Administered 2022-01-18: 4 mg via INTRAVENOUS

## 2022-01-17 MED ORDER — NICOTINE 21 MG/24HR TD PT24
21.0000 mg | MEDICATED_PATCH | Freq: Every day | TRANSDERMAL | Status: DC
  Administered 2022-01-17 – 2022-01-19 (×3): 21 mg via TRANSDERMAL
  Filled 2022-01-17 (×3): qty 1

## 2022-01-17 NOTE — Progress Notes (Signed)
Orthopedic Tech Progress Note Patient Details:  Jonathan Harrington 08/01/1956 [id number]  Ortho Devices Type of Ortho Device: Post (long leg) splint, Ace wrap Ortho Device/Splint Location: right Ortho Device/Splint Interventions: Application   Post Interventions Patient Tolerated: Well Instructions Provided: Care of device  [name] 01/17/2022, 8:07 PM

## 2022-01-17 NOTE — ED Notes (Signed)
Patient given ham sandwich and water. 

## 2022-01-17 NOTE — ED Provider Notes (Signed)
[facility]-EMERGENCY DEPT Provider Note   CSN: [id number] Arrival date & time: 01/17/22  1802     History  Chief Complaint  Patient presents with   Leg Pain    Jonathan Harrington is a 66 y.o. male.   Leg Pain  Patient presenting with right leg pain.  This happened acutely last night, patient was try to get out of the bed when he fell and landed on his shin.  States she was able to get back in bed and went to sleep.  Woke up with excruciating pain unable to ambulate secondary to pain.  There is erythema to the anterior shin, denies any paresthesias.  Has not taken anything for pain.  Denies hitting his head, not having any pain elsewhere.  Medical history is notable for COPD, alcohol use disorder.  No history of anticoagulation.  Home Medications Prior to Admission medications   Medication Sig Start Date End Date Taking? Authorizing Provider  albuterol (VENTOLIN HFA) 108 (90 Base) MCG/ACT inhaler Inhale 2 puffs into the lungs every 6 (six) hours as needed for wheezing or shortness of breath. 05/14/19   [name], Gwenlyn Found, MD  allopurinol (ZYLOPRIM) 300 MG tablet Take 1 tablet (300 mg total) by mouth daily. 11/29/21   [name], Gwenlyn Found, MD  ALPRAZolam (XANAX) 0.25 MG tablet TAKE 1 TABLET SCHEDULED EVERY MORNING AND MAY REPEAT IN THE EVENING AS NEEDED FOR ANXIETY 11/29/21   [name], Gwenlyn Found, MD  amLODipine (NORVASC) 5 MG tablet Take 1 tablet (5 mg total) by mouth daily. 07/13/21   [name], Gwenlyn Found, MD  ascorbic Acid (VITAMIN C) 500 MG CPCR Take 500 mg by mouth daily.    [provider]  Cholecalciferol (VITAMIN D3) 50 MCG (2000 UT) TABS Take by mouth.    [provider]  diphenhydrAMINE HCl, Sleep, (SLEEP-AID) 50 MG CAPS Take 50 mg by mouth at bedtime.    [provider]  gabapentin (NEURONTIN) 300 MG capsule TAKE 3 CAPSULES BY MOUTH THREE TIMES DAILY 11/23/21   Myra Rude, MD  HYDROcodone-acetaminophen (NORCO) 7.5-325 MG tablet Take  1 tablet by mouth every 8 (eight) hours as needed for moderate pain. 11/29/21   [name], Gwenlyn Found, MD  lisinopril (ZESTRIL) 40 MG tablet Take 1 tablet (40 mg total) by mouth daily. 07/13/21   [name], Gwenlyn Found, MD  rosuvastatin (CRESTOR) 20 MG tablet Take 1 tablet (20 mg total) by mouth daily. 07/13/21   [name], Gwenlyn Found, MD  sodium zirconium cyclosilicate (LOKELMA) 5 g packet Take 5 g by mouth every other day. 11/30/21   [name], Gwenlyn Found, MD  triamcinolone cream (KENALOG) 0.1 % Apply 1 application topically 2 (two) times daily as needed (eczema on leg). 01/06/22   [name], Gwenlyn Found, MD      Allergies    Ibuprofen    Review of Systems   Review of Systems  Skin:  Positive for color change.   Physical Exam Updated Vital Signs BP (!) 159/74    Pulse (!) 102    Temp 98.1 F (36.7 C) (Oral)    Resp 16    SpO2 93%  Physical Exam Vitals and nursing note reviewed. Exam conducted with a chaperone present.  Constitutional:      General: He is not in acute distress.    Appearance: Normal appearance.  HENT:     Head: Normocephalic and atraumatic.  Eyes:     General: No scleral icterus.    Extraocular Movements: Extraocular movements intact.  Pupils: Pupils are equal, round, and reactive to light.  Cardiovascular:     Pulses: Normal pulses.     Comments: DP and PT 2+ Skin:    Capillary Refill: Capillary refill takes less than 2 seconds.     Coloration: Skin is not jaundiced.     Findings: Erythema present.     Comments: Anterior shin with erythema, slight abrasion.  It is tender to touch, able to tolerate range to the ankle.  Tolerates range of the knee without any tenderness to palpation to either.  No hip pain.  Neurological:     Mental Status: He is alert. Mental status is at baseline.     Coordination: Coordination normal.    ED Results / Procedures / Treatments   Labs (all labs ordered are listed, but only abnormal results are displayed) Labs Reviewed  RESP PANEL BY RT-PCR  (FLU A&B, COVID) [id number]  CBC WITH DIFFERENTIAL/PLATELET  BASIC METABOLIC PANEL  HEPATIC FUNCTION PANEL    EKG None  Radiology DG Tibia/Fibula Right  Result Date: 01/17/2022 CLINICAL DATA:  Fall, pain EXAM: RIGHT TIBIA AND FIBULA - 2 VIEW COMPARISON:  None. FINDINGS: There is an oblique fracture through the right distal tibial shaft with lateral and anterior displacement of the distal fragment. Fracture noted through the fibular neck with slight lateral displacement of the distal fragment. No subluxation or dislocation. Vascular calcifications noted. IMPRESSION: Distal tibial shaft fracture.  Fibular neck fracture. Electronically Signed   By: Charlett Nose M.D.   On: 01/17/2022 19:12   DG Ankle Complete Right  Result Date: 01/17/2022 CLINICAL DATA:  Fall EXAM: RIGHT ANKLE - COMPLETE 3+ VIEW COMPARISON:  None. FINDINGS: There is an oblique fracture noted through the distal right tibial shaft with lateral displacement of the distal tibia approximately 1 shaft with. Slight anterior displacement as well. No visible mid to distal fibular abnormality. Ankle joint is maintained. IMPRESSION: Mildly displaced right distal tibial shaft fracture. Electronically Signed   By: Charlett Nose M.D.   On: 01/17/2022 19:11   CT Cervical Spine Wo Contrast  Result Date: 01/17/2022 CLINICAL DATA:  Fall. EXAM: CT CERVICAL SPINE WITHOUT CONTRAST TECHNIQUE: Multidetector CT imaging of the cervical spine was performed without intravenous contrast. Multiplanar CT image reconstructions were also generated. RADIATION DOSE REDUCTION: This exam was performed according to the departmental dose-optimization program which includes automated exposure control, adjustment of the mA and/or kV according to patient size and/or use of iterative reconstruction technique. COMPARISON:  None. FINDINGS: Alignment: Normal. Skull base and vertebrae: No acute fracture. No primary bone lesion or focal pathologic process. Soft tissues and spinal canal:  No prevertebral fluid or swelling. No visible canal hematoma. Disc levels: Degenerative disc space narrowing is seen at C6-C7. There is mild left and moderate right neural foraminal stenosis at this level. No significant central canal stenosis at any level. Upper chest: Negative. Other: There are emphysematous changes in the lung apices. IMPRESSION: 1. No acute fracture or traumatic subluxation of the cervical spine. Electronically Signed   By: Darliss Cheney M.D.   On: 01/17/2022 19:43    Procedures Procedures    Medications Ordered in ED Medications  HYDROmorphone (DILAUDID) injection 1 mg (has no administration in time range)  morphine (PF) 4 MG/ML injection 4 mg (4 mg Intravenous Given 01/17/22 1853)  ondansetron (ZOFRAN) injection 4 mg (4 mg Intravenous Given 01/17/22 1853)    ED Course/ Medical Decision Making/ A&P  Medical Decision Making Amount and/or Complexity of Data Reviewed Labs: ordered. Radiology: ordered.  Risk Prescription drug management.   This patient presents to the ED for concern of right anterior shin pain, this involves an extensive number of treatment options, and is a complaint that carries with it a high risk of complications and morbidity.  The differential diagnosis includes fracture, muscle strain, other   Co morbidities that complicate the patient evaluation: History of alcohol use disorder   Additional history obtained: -External records from outside source obtained and reviewed including: Chart review including previous notes, labs, imaging, consultation notes   Imaging Studies ordered: -I ordered imaging studies including CT head, C-spine, plain films of tib-fib -I independently visualized and interpreted imaging which showed patient has a tibial shaft fracture as well as a fibular head fracture -I agree with the radiologist interpretation   Medicines ordered and prescription drug management: -I ordered medication  including morphine and Zofran for pain -Reevaluation of the patient after these medicines showed that the patient improved -I have reviewed the patients home medicines and have made adjustments as needed   Consultations Obtained: I requested consultation with the on-call orthopedic surgeon,  and discussed lab and imaging findings as well as pertinent plan - they recommend:  Dr. Susa Simmonds recommends keeping the patient n.p.o. after midnight.  He will see the patient at [facility] tomorrow.  Given the patient's history of alcohol use disorder he thinks the patient should be admitted to medicine.  I have put in for basic labs and COVID test, will consult hospitalist service. Spoke with Dr. Allena Katz with [facility].  He graciously excepted the patient.   ED Course: Patient presenting with fall.  Obvious tenderness and palpable palpable step-off.  X-ray is notable for tibial fracture and fibular head fracture.  Spoke with the appropriate consults, patient is to have surgery in the morning tomorrow.  N.p.o. after midnight, admitted to medicine due to history of alcohol use disorder.  I do not think he is in active withdrawal at this time, unclear the severity of the alcohol use.  Given he fell yesterday and was able to get back in bed and fell asleep despite the pain I presume he may have been drinking.  His anterior LE is soft, I do not have a high suspicion for compartment syndrome at this time.    Cardiac Monitoring: The patient was maintained on a cardiac monitor.  I personally viewed and interpreted the cardiac monitored which showed an underlying rhythm of: sinus tachycardia   Reevaluation: After the interventions noted above, I reevaluated the patient and found that they have :stayed the same   Dispostion: Admit to [facility], surgery in the morning.  N.p.o. after midnight.   Discussed HPI, physical exam and plan of care for this patient with attending Dr. Tanda Rockers. The attending physician  evaluated this patient as part of a shared visit and agrees with plan of care.          Final Clinical Impression(s) / ED Diagnoses Final diagnoses:  None    Rx / DC Orders ED Discharge Orders     None         Theron Arista, Cordelia Poche 01/17/22 2020    Ernie Avena, MD 01/18/22 0025

## 2022-01-17 NOTE — ED Triage Notes (Signed)
Pt BIB EMS from home. Pt fell around 2am this morning and injured his right leg. Pt reports using a TV tray to get himself up off the ground and into bed. Pt reports finally calling EMS about 2 hours ago and then crawled to the door to let him in, which took him about an hour to get to the door. Pt has swelling, bruising, and redness to his right leg. Pulses present.  20G LAC Fentanyl given

## 2022-01-17 NOTE — ED Notes (Signed)
Ortho tech contacted for long leg splint application.

## 2022-01-17 NOTE — Consult Note (Signed)
Brief orthopedic consult note:  66 year old male status post fall last night with right displaced tibia and proximal fibula fracture.  Consulted by ED.  I reviewed the images.  He has a displaced tibia fracture.  He is indicated for intramedullary nail of his tibia.  Plan is for transfer to [facility] due to the severity of his injury and likely OR tomorrow.    Keep n.p.o. past midnight. Long-leg splint ordered Nonweightbearing on right lower extremity.

## 2022-01-17 NOTE — ED Notes (Addendum)
Patient placed on 2 L [place] d/t oxygen saturation dropping to 86%.  Patient alert and oriented the entire time.

## 2022-01-17 NOTE — H&P (Signed)
History and Physical    Jonathan Harrington [id number] DOB: 21-Dec-1955 DOA: 01/17/2022  PCP: Darreld Mclean, MD  Patient coming from: Home  I have personally briefly reviewed patient's old medical records in [place]  Chief Complaint: Right ankle pain after fall  HPI: Jonathan Harrington is a 66 y.o. male with medical history significant for COPD, HTN, HLD, gout, anxiety, chronic pain, diverticulitis s/p partial colectomy, alcohol and tobacco use who presented to the ED for evaluation of right ankle pain after fall.  Patient states he fell around 2 AM morning of 01/17/2022 when he was getting out of bed to use the bathroom.  He says he tripped and injured his right leg.  He was unable to stand on his own but was able to calm himself back into bed.  He lives alone and did not call EMS until the afternoon.  He was noted to have swelling, bruising, and redness to his right leg and was brought to the ED for further evaluation.  Patient denies any lightheadedness, dizziness, chest pain, dyspnea at the time of his fall.  He does report daily alcohol use of up to half a pint of whiskey nightly, last drink was night of 2/5.  He denies any history of alcohol withdrawal.  He reports smoking 2 packs of cigarettes per day.  ED Course   Labs/Imaging on admission: I have personally reviewed following labs and imaging studies.  Initial vitals showed BP 150/60, pulse 103, RR 16, temp 98.1 F, SPO2 95% on room air.  Labs show WBC 7.0, hemoglobin 12.2, platelets 125,000, sodium 137, potassium 4.3, bicarb 24, BUN 17, creatinine 1.01, serum glucose 99, LFTs within normal limits.  SARS-CoV-2 and influenza PCR negative.  Right ankle, tibia/fibula x-ray showed distal tibial shaft fracture and fibular neck fracture.  CT cervical spine without contrast was negative for acute fracture or traumatic subluxation of the cervical spine.  CT head without contrast negative for acute intracranial abnormality.  EDP  discussed with on-call orthopedics, Dr. Lucia Gaskins, who recommended long-leg splint and admission to [facility] for anticipated surgical fixation on 2/7.  The hospitalist service was consulted to admit for further evaluation and management.  Review of Systems: All systems reviewed and are negative except as documented in history of present illness above.   Past Medical History:  Diagnosis Date   Alcoholism [facility])    Allergy    Anxiety    COPD (chronic obstructive pulmonary disease) ([place])    Diverticulitis 2011   resulted in partial colectomy   Essential hypertension 06/06/2016   Full dentures    Gout    Hematuria    Wears glasses     Past Surgical History:  Procedure Laterality Date   ABDOMINAL AORTOGRAM W/LOWER EXTREMITY Bilateral 07/19/2019   Procedure: ABDOMINAL AORTOGRAM W/LOWER EXTREMITY;  Surgeon: Elam Dutch, MD;  Location: [place] [facility];  Service: Cardiovascular;  Laterality: Bilateral;   COLON SURGERY  2011   partial colectomy   COLONOSCOPY     FINGER ARTHROPLASTY  1990   rt index fx   HERNIA REPAIR     INGUINAL HERNIA REPAIR Left 05/27/2014   Procedure: LEFT INGUINAL HERNIA REPAIR WITH MESH;  Surgeon: Imogene Burn. Georgette Dover, MD;  Location: [place];  Service: General;  Laterality: Left;   INSERTION OF MESH Left 05/27/2014   Procedure: INSERTION OF MESH;  Surgeon: Imogene Burn. Georgette Dover, MD;  Location: [place];  Service: General;  Laterality: Left;  Social History:  reports that he has been smoking cigarettes. He has been smoking an average of 2 packs per day. He has never used smokeless tobacco. He reports current alcohol use of about 21.0 standard drinks per week. He reports that he does not use drugs.  Allergies  Allergen Reactions   Ibuprofen Swelling and Other (See Comments)    Hands and face swell- had to seek medical treatment    Family History  Problem Relation Age of Onset   Alzheimer's disease Mother    Cancer Sister     Neuropathy Neg Hx      Prior to Admission medications   Medication Sig Start Date End Date Taking? Authorizing Provider  albuterol (VENTOLIN HFA) 108 (90 Base) MCG/ACT inhaler Inhale 2 puffs into the lungs every 6 (six) hours as needed for wheezing or shortness of breath. 05/14/19  Yes [name], Gay Filler, MD  allopurinol (ZYLOPRIM) 300 MG tablet Take 1 tablet (300 mg total) by mouth daily. 11/29/21  Yes [name], Gay Filler, MD  ALPRAZolam (XANAX) 0.25 MG tablet TAKE 1 TABLET SCHEDULED EVERY MORNING AND MAY REPEAT IN THE EVENING AS NEEDED FOR ANXIETY 11/29/21  Yes [name], Gay Filler, MD  amLODipine (NORVASC) 5 MG tablet Take 1 tablet (5 mg total) by mouth daily. 07/13/21  Yes [name], Gay Filler, MD  ascorbic Acid (VITAMIN C) 500 MG CPCR Take 500 mg by mouth daily.   Yes [provider]  Cholecalciferol (VITAMIN D3) 50 MCG (2000 UT) TABS Take 2,000 Units by mouth daily.    [provider]  diphenhydrAMINE HCl, Sleep, (SLEEP-AID) 50 MG CAPS Take 50 mg by mouth at bedtime.    [provider]  gabapentin (NEURONTIN) 300 MG capsule TAKE 3 CAPSULES BY MOUTH THREE TIMES DAILY 11/23/21   Rosemarie Ax, MD  HYDROcodone-acetaminophen (NORCO) 7.5-325 MG tablet Take 1 tablet by mouth every 8 (eight) hours as needed for moderate pain. 11/29/21   [name], Gay Filler, MD  lisinopril (ZESTRIL) 40 MG tablet Take 1 tablet (40 mg total) by mouth daily. 07/13/21   [name], Gay Filler, MD  rosuvastatin (CRESTOR) 20 MG tablet Take 1 tablet (20 mg total) by mouth daily. 07/13/21   [name], Gay Filler, MD  sodium zirconium cyclosilicate (LOKELMA) 5 g packet Take 5 g by mouth every other day. 11/30/21   [name], Gay Filler, MD  Testosterone 1.62 % GEL Place 1 Pump onto the skin in the morning. 11/02/21   [provider]  triamcinolone cream (KENALOG) 0.1 % Apply 1 application topically 2 (two) times daily as needed (eczema on leg). 01/06/22   Darreld Mclean, MD    Physical  Exam: Vitals:   01/17/22 2130 01/17/22 2200 01/17/22 2300 01/18/22 0000  BP: (!) 151/61 (!) 149/70 (!) 155/79 (!) 158/78  Pulse: (!) 108 (!) 110 99 99  Resp: 16 16 16 13   Temp:      TempSrc:      SpO2: 92% 91% 94% 95%   Constitutional: Resting supine in bed, NAD, calm, comfortable Eyes: PERRL, lids and conjunctivae normal ENMT: Mucous membranes are moist. Posterior pharynx clear of any exudate or lesions.Normal dentition.  Neck: normal, supple, no masses. Respiratory: clear to auscultation bilaterally, no wheezing, no crackles. Normal respiratory effort. No accessory muscle use.  Cardiovascular: Regular rate and rhythm, no murmurs / rubs / gallops. No extremity edema. 2+ pedal pulses. Abdomen: no tenderness, no masses palpated. No hepatosplenomegaly. Bowel sounds positive.  Musculoskeletal: Long-leg splint in place right lower extremity.  ROM bilateral upper and left lower extremity intact. Skin: no rashes, lesions, ulcers. No induration Neurologic: CN 2-12 grossly intact. Sensation intact. Strength 5/5 in all 4.  Psychiatric: Alert and oriented x 3. Normal mood.   EKG: Ordered and pending.  Assessment/Plan Principal Problem:   Closed fracture of right tibia and fibula Active Problems:   Alcohol use   COPD (chronic obstructive pulmonary disease) (HCC)   Essential hypertension   Anxiety   Hyperlipidemia   Chronic pain   Tobacco use   Jonathan Harrington is a 67 y.o. male with medical history significant for COPD, HTN, HLD, gout, anxiety, chronic pain, diverticulitis s/p partial colectomy, alcohol and tobacco use who is admitted with right displaced tibia and proximal fibular fractures after mechanical fall.  Orthopedics plan for surgical fixation at [facility] on 01/18/2022.  Assessment and Plan: * Closed fracture of right tibia and fibula- (present on admission) Occurring after mechanical fall.  Orthopedics plan for surgical fixation at [facility] on 2/7. -Keep n.p.o. after  midnight -Long-leg splint placed to RLE -Continue analgesics as needed  Alcohol use- (present on admission) Reports daily alcohol use, up to half a pint of whiskey per day.  Last drink evening of 2/5.  May have contributed to fall.  Denies any withdrawal history or current symptoms. -Continue CIWA checks  COPD (chronic obstructive pulmonary disease) ([place])- (present on admission) Currently stable.  Continue albuterol as needed.  Essential hypertension- (present on admission) Continue amlodipine and lisinopril.  Anxiety- (present on admission) Continue home Xanax.  Hyperlipidemia- (present on admission) Continue rosuvastatin.  Chronic pain- (present on admission) Continue home Norco.  Tobacco use- (present on admission) Reports smoking 2 packs/day.  Nicotine patch ordered.  DVT prophylaxis: SCDs Start: 01/17/22 2141 Code Status: DNR, confirmed with patient on admission Family Communication: Discussed with patient, has discussed with family Disposition Plan: From home, dispo pending orthopedic surgical intervention and postoperative progress Consults called: Orthopedics Severity of Illness: The appropriate patient status for this patient is OBSERVATION. Observation status is judged to be reasonable and necessary in order to provide the required intensity of service to ensure the patient's safety. The patient's presenting symptoms, physical exam findings, and initial radiographic and laboratory data in the context of their medical condition is felt to place them at decreased risk for further clinical deterioration. Furthermore, it is anticipated that the patient will be medically stable for discharge from the hospital within 2 midnights of admission.   Zada Finders MD [facility]  If 7PM-7AM, please contact night-coverage [email or web address]  01/18/2022, 12:39 AM

## 2022-01-18 ENCOUNTER — Inpatient Hospital Stay (HOSPITAL_COMMUNITY): Payer: Medicare PPO | Admitting: Anesthesiology

## 2022-01-18 ENCOUNTER — Other Ambulatory Visit: Payer: Self-pay

## 2022-01-18 ENCOUNTER — Inpatient Hospital Stay (HOSPITAL_COMMUNITY): Payer: Medicare PPO

## 2022-01-18 ENCOUNTER — Encounter (HOSPITAL_COMMUNITY): Payer: Self-pay | Admitting: Internal Medicine

## 2022-01-18 ENCOUNTER — Encounter (HOSPITAL_COMMUNITY)
Admission: EM | Disposition: A | Discharge status: Home Health | Payer: Self-pay | Source: Home / Self Care | Attending: Family Medicine

## 2022-01-18 DIAGNOSIS — J449 Chronic obstructive pulmonary disease, unspecified: Secondary | ICD-10-CM

## 2022-01-18 DIAGNOSIS — Z789 Other specified health status: Secondary | ICD-10-CM

## 2022-01-18 DIAGNOSIS — I10 Essential (primary) hypertension: Secondary | ICD-10-CM

## 2022-01-18 DIAGNOSIS — Z720 Tobacco use: Secondary | ICD-10-CM

## 2022-01-18 DIAGNOSIS — E785 Hyperlipidemia, unspecified: Secondary | ICD-10-CM

## 2022-01-18 DIAGNOSIS — G8929 Other chronic pain: Secondary | ICD-10-CM

## 2022-01-18 DIAGNOSIS — F419 Anxiety disorder, unspecified: Secondary | ICD-10-CM

## 2022-01-18 HISTORY — PX: TIBIA IM NAIL INSERTION: SHX2516

## 2022-01-18 LAB — BASIC METABOLIC PANEL
Anion gap: 9 (ref 5–15)
BUN: 9 mg/dL (ref 8–23)
CO2: 25 mmol/L (ref 22–32)
Calcium: 8.7 mg/dL — ABNORMAL LOW (ref 8.9–10.3)
Chloride: 103 mmol/L (ref 98–111)
Creatinine, Ser: 1.02 mg/dL (ref 0.61–1.24)
GFR, Estimated: >60 mL/min (ref >60)
Glucose, Bld: 156 mg/dL — ABNORMAL HIGH (ref 70–99)
Potassium: 4 mmol/L (ref 3.5–5.1)
Sodium: 137 mmol/L (ref 135–145)

## 2022-01-18 LAB — CBC
HCT: 32.7 % — ABNORMAL LOW (ref 39.0–52.0)
Hemoglobin: 10.9 g/dL — ABNORMAL LOW (ref 13.0–17.0)
MCH: 33.6 pg (ref 26.0–34.0)
MCHC: 33.3 g/dL (ref 30.0–36.0)
MCV: 100.9 fL — ABNORMAL HIGH (ref 80.0–100.0)
Platelets: 123 10*3/uL — ABNORMAL LOW (ref 150–400)
RBC: 3.24 MIL/uL — ABNORMAL LOW (ref 4.22–5.81)
RDW: 12.4 % (ref 11.5–15.5)
WBC: 6.3 10*3/uL (ref 4.0–10.5)
nRBC: 0 % (ref 0.0–0.2)

## 2022-01-18 LAB — SURGICAL PCR SCREEN
MRSA, PCR: NEGATIVE
Staphylococcus aureus: NEGATIVE

## 2022-01-18 LAB — HIV ANTIBODY (ROUTINE TESTING W REFLEX): HIV Screen 4th Generation wRfx: NONREACTIVE

## 2022-01-18 SURGERY — INSERTION, INTRAMEDULLARY ROD, TIBIA
Anesthesia: General | Site: Leg Lower | Laterality: Right

## 2022-01-18 MED ORDER — PROPOFOL 10 MG/ML IV BOLUS
INTRAVENOUS | Status: DC | PRN
Start: 2022-01-18 — End: 2022-01-18
  Administered 2022-01-18: 200 mg via INTRAVENOUS

## 2022-01-18 MED ORDER — CEFAZOLIN SODIUM-DEXTROSE 2-4 GM/100ML-% IV SOLN
INTRAVENOUS | Status: AC
  Filled 2022-01-18: qty 100

## 2022-01-18 MED ORDER — GLYCOPYRROLATE PF 0.2 MG/ML IJ SOSY
PREFILLED_SYRINGE | INTRAMUSCULAR | Status: AC
  Filled 2022-01-18: qty 1

## 2022-01-18 MED ORDER — FENTANYL CITRATE (PF) 100 MCG/2ML IJ SOLN
25.0000 ug | INTRAMUSCULAR | Status: DC | PRN
  Administered 2022-01-18 (×2): 50 ug via INTRAVENOUS

## 2022-01-18 MED ORDER — OXYCODONE HCL 5 MG PO TABS: 10.0000 mg | ORAL_TABLET | ORAL | Status: DC | PRN

## 2022-01-18 MED ORDER — ACETAMINOPHEN 500 MG PO TABS
1000.0000 mg | ORAL_TABLET | Freq: Once | ORAL | Status: AC
Start: 2022-01-18 — End: 2022-01-18

## 2022-01-18 MED ORDER — ALPRAZOLAM 0.25 MG PO TABS
0.2500 mg | ORAL_TABLET | Freq: Every day | ORAL | Status: DC
  Administered 2022-01-18 – 2022-01-19 (×2): 0.25 mg via ORAL
  Filled 2022-01-18 (×2): qty 1

## 2022-01-18 MED ORDER — FENTANYL CITRATE (PF) 250 MCG/5ML IJ SOLN
INTRAMUSCULAR | Status: AC
  Filled 2022-01-18: qty 5

## 2022-01-18 MED ORDER — CEFAZOLIN SODIUM-DEXTROSE 1-4 GM/50ML-% IV SOLN
1.0000 g | Freq: Four times a day (QID) | INTRAVENOUS | Status: AC
  Administered 2022-01-18 – 2022-01-19 (×3): 1 g via INTRAVENOUS
  Filled 2022-01-18 (×3): qty 50

## 2022-01-18 MED ORDER — DEXAMETHASONE SODIUM PHOSPHATE 10 MG/ML IJ SOLN
INTRAMUSCULAR | Status: DC | PRN
  Administered 2022-01-18: 8 mg via INTRAVENOUS

## 2022-01-18 MED ORDER — POVIDONE-IODINE 10 % EX SWAB
2.0000 "application " | Freq: Once | CUTANEOUS | Status: AC
  Administered 2022-01-18: 2 via TOPICAL

## 2022-01-18 MED ORDER — LIDOCAINE 2% (20 MG/ML) 5 ML SYRINGE
INTRAMUSCULAR | Status: DC | PRN
  Administered 2022-01-18: 100 mg via INTRAVENOUS

## 2022-01-18 MED ORDER — LIDOCAINE 2% (20 MG/ML) 5 ML SYRINGE
INTRAMUSCULAR | Status: AC
  Filled 2022-01-18: qty 10

## 2022-01-18 MED ORDER — LACTATED RINGERS IV SOLN: INTRAVENOUS | Status: DC

## 2022-01-18 MED ORDER — CEFAZOLIN SODIUM-DEXTROSE 2-4 GM/100ML-% IV SOLN
2.0000 g | INTRAVENOUS | Status: AC
  Administered 2022-01-18: 2 g via INTRAVENOUS

## 2022-01-18 MED ORDER — ROSUVASTATIN CALCIUM 20 MG PO TABS
20.0000 mg | ORAL_TABLET | Freq: Every day | ORAL | Status: DC
  Administered 2022-01-18 (×2): 20 mg via ORAL
  Filled 2022-01-18 (×2): qty 1

## 2022-01-18 MED ORDER — HYDROCODONE-ACETAMINOPHEN 7.5-325 MG PO TABS
1.0000 | ORAL_TABLET | Freq: Two times a day (BID) | ORAL | Status: DC
  Administered 2022-01-18 – 2022-01-19 (×4): 1 via ORAL
  Filled 2022-01-18 (×4): qty 1

## 2022-01-18 MED ORDER — ONDANSETRON HCL 4 MG/2ML IJ SOLN
INTRAMUSCULAR | Status: AC
  Filled 2022-01-18: qty 2

## 2022-01-18 MED ORDER — ACETAMINOPHEN 500 MG PO TABS
1000.0000 mg | ORAL_TABLET | Freq: Four times a day (QID) | ORAL | Status: DC
  Administered 2022-01-18 – 2022-01-19 (×3): 1000 mg via ORAL
  Filled 2022-01-18 (×3): qty 2

## 2022-01-18 MED ORDER — OXYCODONE HCL 5 MG PO TABS: 5.0000 mg | ORAL_TABLET | Freq: Once | ORAL | Status: DC | PRN

## 2022-01-18 MED ORDER — PHENYLEPHRINE 40 MCG/ML (10ML) SYRINGE FOR IV PUSH (FOR BLOOD PRESSURE SUPPORT)
PREFILLED_SYRINGE | INTRAVENOUS | Status: AC
  Filled 2022-01-18: qty 10

## 2022-01-18 MED ORDER — PHENYLEPHRINE HCL-NACL 20-0.9 MG/250ML-% IV SOLN
INTRAVENOUS | Status: DC | PRN
  Administered 2022-01-18: 75 ug/min via INTRAVENOUS

## 2022-01-18 MED ORDER — CHLORHEXIDINE GLUCONATE 0.12 % MT SOLN
OROMUCOSAL | Status: AC
  Administered 2022-01-18: 15 mL
  Filled 2022-01-18: qty 15

## 2022-01-18 MED ORDER — MIDAZOLAM HCL 2 MG/2ML IJ SOLN
INTRAMUSCULAR | Status: AC
  Filled 2022-01-18: qty 2

## 2022-01-18 MED ORDER — METOCLOPRAMIDE HCL 5 MG PO TABS: 5.0000 mg | ORAL_TABLET | Freq: Three times a day (TID) | ORAL | Status: DC | PRN

## 2022-01-18 MED ORDER — DIPHENHYDRAMINE HCL 12.5 MG/5ML PO ELIX: 12.5000 mg | ORAL_SOLUTION | ORAL | Status: DC | PRN

## 2022-01-18 MED ORDER — OXYCODONE HCL 5 MG/5ML PO SOLN: 5.0000 mg | Freq: Once | ORAL | Status: DC | PRN

## 2022-01-18 MED ORDER — LISINOPRIL 20 MG PO TABS
40.0000 mg | ORAL_TABLET | Freq: Every day | ORAL | Status: DC
  Administered 2022-01-18 – 2022-01-19 (×2): 40 mg via ORAL
  Filled 2022-01-18: qty 2
  Filled 2022-01-18: qty 4

## 2022-01-18 MED ORDER — ALPRAZOLAM 0.25 MG PO TABS
0.2500 mg | ORAL_TABLET | Freq: Every evening | ORAL | Status: DC | PRN
  Administered 2022-01-18: 0.25 mg via ORAL
  Filled 2022-01-18: qty 1

## 2022-01-18 MED ORDER — CELECOXIB 200 MG PO CAPS
200.0000 mg | ORAL_CAPSULE | Freq: Two times a day (BID) | ORAL | Status: DC
  Administered 2022-01-19: 200 mg via ORAL
  Filled 2022-01-18 (×2): qty 1

## 2022-01-18 MED ORDER — ALBUTEROL SULFATE (2.5 MG/3ML) 0.083% IN NEBU: 3.0000 mL | INHALATION_SOLUTION | Freq: Four times a day (QID) | RESPIRATORY_TRACT | Status: DC | PRN

## 2022-01-18 MED ORDER — GLYCOPYRROLATE 0.2 MG/ML IJ SOLN
INTRAMUSCULAR | Status: DC | PRN
  Administered 2022-01-18: .2 mg via INTRAVENOUS

## 2022-01-18 MED ORDER — DEXAMETHASONE SODIUM PHOSPHATE 10 MG/ML IJ SOLN
INTRAMUSCULAR | Status: AC
  Filled 2022-01-18: qty 1

## 2022-01-18 MED ORDER — 0.9 % SODIUM CHLORIDE (POUR BTL) OPTIME
TOPICAL | Status: DC | PRN
Start: 2022-01-18 — End: 2022-01-18
  Administered 2022-01-18: 1000 mL

## 2022-01-18 MED ORDER — ROCURONIUM BROMIDE 10 MG/ML (PF) SYRINGE
PREFILLED_SYRINGE | INTRAVENOUS | Status: AC
  Filled 2022-01-18: qty 10

## 2022-01-18 MED ORDER — CHLORHEXIDINE GLUCONATE 0.12 % MT SOLN
OROMUCOSAL | Status: AC
  Filled 2022-01-18: qty 15

## 2022-01-18 MED ORDER — LIDOCAINE 2% (20 MG/ML) 5 ML SYRINGE
INTRAMUSCULAR | Status: AC
  Filled 2022-01-18: qty 5

## 2022-01-18 MED ORDER — GABAPENTIN 300 MG PO CAPS
900.0000 mg | ORAL_CAPSULE | Freq: Three times a day (TID) | ORAL | Status: DC
  Administered 2022-01-18 – 2022-01-19 (×3): 900 mg via ORAL
  Filled 2022-01-18 (×3): qty 3

## 2022-01-18 MED ORDER — MIDAZOLAM HCL 2 MG/2ML IJ SOLN
INTRAMUSCULAR | Status: DC | PRN
  Administered 2022-01-18: 2 mg via INTRAVENOUS

## 2022-01-18 MED ORDER — ACETAMINOPHEN 500 MG PO TABS
ORAL_TABLET | ORAL | Status: AC
  Administered 2022-01-18: 1000 mg via ORAL
  Filled 2022-01-18: qty 2

## 2022-01-18 MED ORDER — EPHEDRINE 5 MG/ML INJ
INTRAVENOUS | Status: AC
  Filled 2022-01-18: qty 5

## 2022-01-18 MED ORDER — FENTANYL CITRATE (PF) 250 MCG/5ML IJ SOLN
INTRAMUSCULAR | Status: DC | PRN
  Administered 2022-01-18: 50 ug via INTRAVENOUS
  Administered 2022-01-18: 100 ug via INTRAVENOUS
  Administered 2022-01-18 (×2): 50 ug via INTRAVENOUS

## 2022-01-18 MED ORDER — OXYCODONE HCL 5 MG PO TABS: 5.0000 mg | ORAL_TABLET | ORAL | Status: DC | PRN

## 2022-01-18 MED ORDER — METHOCARBAMOL 500 MG PO TABS: 500.0000 mg | ORAL_TABLET | Freq: Four times a day (QID) | ORAL | Status: DC | PRN

## 2022-01-18 MED ORDER — ENOXAPARIN SODIUM 40 MG/0.4ML IJ SOSY
40.0000 mg | PREFILLED_SYRINGE | INTRAMUSCULAR | Status: DC
  Administered 2022-01-19: 40 mg via SUBCUTANEOUS
  Filled 2022-01-18: qty 0.4

## 2022-01-18 MED ORDER — ALLOPURINOL 300 MG PO TABS
300.0000 mg | ORAL_TABLET | Freq: Every day | ORAL | Status: DC
  Administered 2022-01-19: 300 mg via ORAL
  Filled 2022-01-18 (×2): qty 1

## 2022-01-18 MED ORDER — METOCLOPRAMIDE HCL 5 MG/ML IJ SOLN: 5.0000 mg | Freq: Three times a day (TID) | INTRAMUSCULAR | Status: DC | PRN

## 2022-01-18 MED ORDER — FENTANYL CITRATE (PF) 100 MCG/2ML IJ SOLN
INTRAMUSCULAR | Status: AC
  Filled 2022-01-18: qty 2

## 2022-01-18 MED ORDER — DOCUSATE SODIUM 100 MG PO CAPS
100.0000 mg | ORAL_CAPSULE | Freq: Two times a day (BID) | ORAL | Status: DC
  Administered 2022-01-18 – 2022-01-19 (×2): 100 mg via ORAL
  Filled 2022-01-18 (×2): qty 1

## 2022-01-18 MED ORDER — PHENYLEPHRINE 40 MCG/ML (10ML) SYRINGE FOR IV PUSH (FOR BLOOD PRESSURE SUPPORT)
PREFILLED_SYRINGE | INTRAVENOUS | Status: DC | PRN
  Administered 2022-01-18 (×4): 80 ug via INTRAVENOUS

## 2022-01-18 MED ORDER — METHOCARBAMOL 1000 MG/10ML IJ SOLN
500.0000 mg | Freq: Four times a day (QID) | INTRAVENOUS | Status: DC | PRN
  Filled 2022-01-18: qty 5

## 2022-01-18 MED ORDER — AMLODIPINE BESYLATE 5 MG PO TABS
5.0000 mg | ORAL_TABLET | Freq: Every day | ORAL | Status: DC
  Administered 2022-01-18 – 2022-01-19 (×2): 5 mg via ORAL
  Filled 2022-01-18 (×2): qty 1

## 2022-01-18 SURGICAL SUPPLY — 62 items
BAG COUNTER SPONGE SURGICOUNT (BAG) IMPLANT
BIT DRILL 4.0X165 AO STYLE (BIT) ×1 IMPLANT
BIT DRILL 4.0X280 (BIT) ×1 IMPLANT
BLADE SURG 15 STRL LF DISP TIS (BLADE) ×1 IMPLANT
BLADE SURG 15 STRL SS (BLADE) ×2
BNDG COHESIVE 4X5 TAN STRL (GAUZE/BANDAGES/DRESSINGS) ×2 IMPLANT
BNDG ELASTIC 6X10 VLCR STRL LF (GAUZE/BANDAGES/DRESSINGS) ×1 IMPLANT
BNDG ELASTIC 6X15 VLCR STRL LF (GAUZE/BANDAGES/DRESSINGS) ×1 IMPLANT
COVER SURGICAL LIGHT HANDLE (MISCELLANEOUS) ×3 IMPLANT
DRAPE C-ARM 42X72 X-RAY (DRAPES) ×2 IMPLANT
DRAPE C-ARMOR (DRAPES) ×2 IMPLANT
DRAPE IMP U-DRAPE 54X76 (DRAPES) ×2 IMPLANT
DRAPE INCISE IOBAN 66X45 STRL (DRAPES) IMPLANT
DRAPE ORTHO SPLIT 77X108 STRL (DRAPES)
DRAPE SURG ORHT 6 SPLT 77X108 (DRAPES) IMPLANT
DRAPE U-SHAPE 47X51 STRL (DRAPES) ×2 IMPLANT
DRAPE UNIVERSAL PACK (DRAPES) ×1 IMPLANT
DURAPREP 26ML APPLICATOR (WOUND CARE) ×2 IMPLANT
ELECT REM PT RETURN 9FT ADLT (ELECTROSURGICAL) ×2
ELECTRODE REM PT RTRN 9FT ADLT (ELECTROSURGICAL) ×1 IMPLANT
FACESHIELD OPICON LG (MASK) IMPLANT
GAUZE SPONGE 4X4 12PLY STRL (GAUZE/BANDAGES/DRESSINGS) ×2 IMPLANT
GAUZE XEROFORM 1X8 LF (GAUZE/BANDAGES/DRESSINGS) ×2 IMPLANT
GLOVE SRG 8 PF TXTR STRL LF DI (GLOVE) ×1 IMPLANT
GLOVE SURG ENC TEXT LTX SZ7.5 (GLOVE) ×2 IMPLANT
GLOVE SURG UNDER POLY LF SZ8 (GLOVE) ×1
GOWN STRL REUS W/ TWL LRG LVL3 (GOWN DISPOSABLE) ×1 IMPLANT
GOWN STRL REUS W/ TWL XL LVL3 (GOWN DISPOSABLE) ×1 IMPLANT
GOWN STRL REUS W/TWL LRG LVL3 (GOWN DISPOSABLE) ×4
GOWN STRL REUS W/TWL XL LVL3 (GOWN DISPOSABLE) ×4
GUIDEWIRE BALL NOSE 3.0X900 (WIRE) ×2
GUIDEWIRE ORTH 900X3XBALL NOSE (WIRE) IMPLANT
KIT BASIN OR (CUSTOM PROCEDURE TRAY) ×2 IMPLANT
KIT TURNOVER KIT B (KITS) ×2 IMPLANT
NAIL TIB DAVINCI 9X36 (Nail) ×1 IMPLANT
PACK ORTHO EXTREMITY (CUSTOM PROCEDURE TRAY) ×2 IMPLANT
PACK UNIVERSAL I (CUSTOM PROCEDURE TRAY) ×1 IMPLANT
PAD ABD 8X10 STRL (GAUZE/BANDAGES/DRESSINGS) ×2 IMPLANT
PAD ARMBOARD 7.5X6 YLW CONV (MISCELLANEOUS) ×2 IMPLANT
PAD CAST 4YDX4 CTTN HI CHSV (CAST SUPPLIES) ×1 IMPLANT
PADDING CAST COTTON 4X4 STRL (CAST SUPPLIES)
PADDING CAST COTTON 6X4 STRL (CAST SUPPLIES) ×2 IMPLANT
PIN GUIDE THRD AR 3.2X330 (PIN) ×1 IMPLANT
SCREW LOCK CORT 5.0X40 (Screw) ×1 IMPLANT
SCREW LOCK CORT CAP 5X70 (Screw) ×1 IMPLANT
SCREW LOCK FEM 5X44 (Screw) ×1 IMPLANT
SCREW LOCK FEM 5X46 (Screw) ×1 IMPLANT
SHEATH TIB ENTRY SP ART DISP (SHEATH) ×1 IMPLANT
SPONGE LAP 18X18 X RAY DECT (DISPOSABLE) ×2 IMPLANT
STAPLER VISISTAT 35W (STAPLE) IMPLANT
STOCKINETTE IMPERVIOUS 9X36 MD (GAUZE/BANDAGES/DRESSINGS) ×2 IMPLANT
SUT ETHILON 2 0 FS 18 (SUTURE) IMPLANT
SUT MNCRL AB 3-0 PS2 18 (SUTURE) ×1 IMPLANT
SUT MON AB 2-0 CT1 27 (SUTURE) ×1 IMPLANT
SUT MON AB 2-0 CT2 27 (SUTURE) ×1 IMPLANT
SUT MON AB-0 CT1 36 (SUTURE) ×1 IMPLANT
SUT VIC AB 2-0 CT1 27 (SUTURE) ×2
SUT VIC AB 2-0 CT1 TAPERPNT 27 (SUTURE) IMPLANT
TOWEL GREEN STERILE (TOWEL DISPOSABLE) ×2 IMPLANT
TOWEL GREEN STERILE FF (TOWEL DISPOSABLE) ×2 IMPLANT
TUBE CONNECTING 12X1/4 (SUCTIONS) ×2 IMPLANT
YANKAUER SUCT BULB TIP NO VENT (SUCTIONS) ×2 IMPLANT

## 2022-01-18 NOTE — Anesthesia Postprocedure Evaluation (Signed)
Anesthesia Post Note  Patient: HILMAN KISSLING  Procedure(s) Performed: INTRAMEDULLARY (IM) NAIL TIBIAL (Right: Leg Lower)     Patient location during evaluation: PACU Anesthesia Type: General Level of consciousness: awake and alert Pain management: pain level controlled Vital Signs Assessment: post-procedure vital signs reviewed and stable Respiratory status: spontaneous breathing, nonlabored ventilation and respiratory function stable Cardiovascular status: blood pressure returned to baseline Postop Assessment: no apparent nausea or vomiting Anesthetic complications: no   No notable events documented.  Last Vitals:  Vitals:   01/18/22 1510 01/18/22 1540  BP:  (!) 152/65  Pulse:  89  Resp: 19 15  Temp: 36.8 C   SpO2:  95%    Last Pain:  Vitals:   01/18/22 1510  TempSrc:   PainSc: 4                  Shanda Howells

## 2022-01-18 NOTE — Progress Notes (Signed)
PROGRESS NOTE    Jonathan Harrington  [id number] DOB: 08/18/1956 DOA: 01/17/2022 PCP: Darreld Mclean, MD    Brief Narrative:  Jonathan Harrington is a 66 y.o. male with medical history significant for COPD, HTN, HLD, gout, anxiety, chronic pain, diverticulitis s/p partial colectomy, alcohol and tobacco use to the hospital after a fall in sustaining right ankle pain.  Patient does have a history of alcohol use almost daily which could have contributed to fall.  Patient was then admitted to hospital  since he was noted to have right displaced tibia and proximal fibular fractures after mechanical fall.  Orthopedics has plans for surgical fixation at [facility] on 01/18/2022.     Assessment and Plan: * Closed fracture of right tibia and fibula- (present on admission) Status post mechanical fall.  Orthopedics plan for surgical fixation at [facility] on 2/7.  Currently NPO.  Continue long leg splint.  Continue analgesics for adequate pain relief.   Tobacco use- (present on admission) History of cigarette smoking 2 packs/day.  Continue nicotine patch.  Hyperlipidemia- (present on admission) On Crestor at home.  Continue for now.  Chronic pain- (present on admission) Continue Norco, Neurontin from home.  On as needed hydromorphone for severe pain.  Alcohol use- (present on admission) Currently denies any withdrawal symptoms but daily alcohol intake.  Last drink at 2/523.  Continue CIWA protocol.  COPD (chronic obstructive pulmonary disease) ([place])- (present on admission) Appears to be stable at this time.  Continue with nebulizers.  Continue nicotine patch for  Essential hypertension- (present on admission) Continue amlodipine lisinopril from home.  Blood pressure seems to be stable.  Anxiety- (present on admission) Continue Xanax.      DVT prophylaxis: SCDs Start: 01/17/22 2141    Code Status:     Code Status: DNR  Disposition:  Status is: Inpatient Remains inpatient appropriate  because: Need for surgical intervention.    Family Communication:  Spoke with the patient at bedside.  Consultants:  Orthopedics  Procedures:  None so far  Antimicrobials:   Preop antibiotic Anti-infectives (From admission, onward)    Start     Dose/Rate Route Frequency Ordered Stop   01/18/22 1000  ceFAZolin (ANCEF) IVPB 2g/100 mL premix        2 g 200 mL/hr over 30 Minutes Intravenous On call to O.R. 01/18/22 [id number] 01/19/22 0559   01/18/22 0955  ceFAZolin (ANCEF) 2-4 GM/100ML-% IVPB       Note to Pharmacy: Humberto Leep O: cabinet override      01/18/22 0955 01/18/22 2159      Subjective: Today, patient was seen and examined at bedside. Complains of pain over the right ankle.  Denies any hallucinations, dizziness, sweating or tremors at this time.  Objective: Vitals:   01/18/22 0500 01/18/22 0530 01/18/22 0700 01/18/22 0949  BP: (!) 160/77 (!) 149/111 (!) 151/85 (!) 177/64  Pulse: 96 94 95 94  Resp:  14 18 20   Temp:    98.4 F (36.9 C)  TempSrc:    Oral  SpO2: 95% 96% 96% 96%  Weight:    78.9 kg  Height:    5\' 11"  (1.803 m)   No intake or output data in the 24 hours ending 01/18/22 1007 Filed Weights   01/18/22 0949  Weight: 78.9 kg    Physical Examination: General:  Average built, not in obvious distress HENT:   No scleral pallor or icterus noted. Oral mucosa is moist.  Chest:  Clear breath sounds.  Diminished breath sounds bilaterally. No crackles or wheezes.  CVS: S1 &S2 heard. No murmur.  Regular rate and rhythm. Abdomen: Soft, nontender, nondistended.  Bowel sounds are heard.   Extremities: No cyanosis, clubbing or edema.  Peripheral pulses are palpable.  Right lower extremity long cast in place.  Toes with capillary refill present. Psych: Alert, awake and oriented, normal mood CNS:  No cranial nerve deficits.  Power equal in all extremities.  Mild tremors noted. Skin: Warm and dry.  No rashes noted.  Data Reviewed:   CBC: Recent Labs  Lab  01/17/22 2022  WBC 7.0  NEUTROABS 4.9  HGB 12.2*  HCT 34.5*  MCV 99.7  PLT 125*    Basic Metabolic Panel: Recent Labs  Lab 01/17/22 2022  NA 137  K 4.3  CL 105  CO2 24  GLUCOSE 99  BUN 17  CREATININE 1.01  CALCIUM 8.8*    GFR: Estimated Creatinine Clearance: 75.6 mL/min (by C-G formula based on SCr of 1.01 mg/dL).  Liver Function Tests: Recent Labs  Lab 01/17/22 2022  AST 24  ALT 14  ALKPHOS 53  BILITOT 0.8  PROT 7.2  ALBUMIN 4.0    CBG: No results for input(s): GLUCAP in the last 168 hours.   Recent Results (from the past 240 hour(s))  Resp Panel by RT-PCR (Flu A&B, Covid) Nasopharyngeal Swab     Status: None   Collection Time: 01/17/22  8:24 PM   Specimen: Nasopharyngeal Swab; Nasopharyngeal(NP) swabs in vial transport medium  Result Value Ref Range Status   SARS Coronavirus 2 by RT PCR NEGATIVE NEGATIVE Final    Comment: (NOTE) SARS-CoV-2 target nucleic acids are NOT DETECTED.  The SARS-CoV-2 RNA is generally detectable in upper respiratory specimens during the acute phase of infection. The lowest concentration of SARS-CoV-2 viral copies this assay can detect is 138 copies/mL. A negative result does not preclude SARS-Cov-2 infection and should not be used as the sole basis for treatment or other patient management decisions. A negative result may occur with  improper specimen collection/handling, submission of specimen other than nasopharyngeal swab, presence of viral mutation(s) within the areas targeted by this assay, and inadequate number of viral copies(<138 copies/mL). A negative result must be combined with clinical observations, patient history, and epidemiological information. The expected result is Negative.  Fact Sheet for Patients:  EntrepreneurPulse.com.au  Fact Sheet for Healthcare Providers:  IncredibleEmployment.be  This test is no t yet approved or cleared by the [place] FDA and  has  been authorized for detection and/or diagnosis of SARS-CoV-2 by FDA under an Emergency Use Authorization (EUA). This EUA will remain  in effect (meaning this test can be used) for the duration of the COVID-19 declaration under Section 564(b)(1) of the Act, 21 U.S.C.section 360bbb-3(b)(1), unless the authorization is terminated  or revoked sooner.       Influenza A by PCR NEGATIVE NEGATIVE Final   Influenza B by PCR NEGATIVE NEGATIVE Final    Comment: (NOTE) The Xpert Xpress SARS-CoV-2/FLU/RSV plus assay is intended as an aid in the diagnosis of influenza from Nasopharyngeal swab specimens and should not be used as a sole basis for treatment. Nasal washings and aspirates are unacceptable for Xpert Xpress SARS-CoV-2/FLU/RSV testing.  Fact Sheet for Patients: EntrepreneurPulse.com.au  Fact Sheet for Healthcare Providers: IncredibleEmployment.be  This test is not yet approved or cleared by the [place] FDA and has been authorized for detection and/or diagnosis of SARS-CoV-2 by FDA under an Emergency Use Authorization (EUA). This EUA  will remain in effect (meaning this test can be used) for the duration of the COVID-19 declaration under Section 564(b)(1) of the Act, 21 U.S.C. section 360bbb-3(b)(1), unless the authorization is terminated or revoked.  Performed at [facility], [place] [address], [place], [place] [address]       Radiology Studies: DG Tibia/Fibula Right  Result Date: 01/17/2022 CLINICAL DATA:  Fall, pain EXAM: RIGHT TIBIA AND FIBULA - 2 VIEW COMPARISON:  None. FINDINGS: There is an oblique fracture through the right distal tibial shaft with lateral and anterior displacement of the distal fragment. Fracture noted through the fibular neck with slight lateral displacement of the distal fragment. No subluxation or dislocation. Vascular calcifications noted. IMPRESSION: Distal tibial shaft fracture.  Fibular neck  fracture. Electronically Signed   By: Rolm Baptise M.D.   On: 01/17/2022 19:12   DG Ankle Complete Right  Result Date: 01/17/2022 CLINICAL DATA:  Fall EXAM: RIGHT ANKLE - COMPLETE 3+ VIEW COMPARISON:  None. FINDINGS: There is an oblique fracture noted through the distal right tibial shaft with lateral displacement of the distal tibia approximately 1 shaft with. Slight anterior displacement as well. No visible mid to distal fibular abnormality. Ankle joint is maintained. IMPRESSION: Mildly displaced right distal tibial shaft fracture. Electronically Signed   By: Rolm Baptise M.D.   On: 01/17/2022 19:11   CT Head Wo Contrast  Addendum Date: 01/18/2022   ADDENDUM REPORT: 01/17/2022 23:00 FINDINGS: Brain: No evidence of acute infarction, hemorrhage, hydrocephalus, extra-axial collection or mass lesion/mass effect. Vascular: Atherosclerotic calcifications are present within the cavernous internal carotid arteries. Skull: Normal. Negative for fracture or focal lesion. Sinuses/Orbits: There is partial left mastoid effusion. Visualized paranasal sinuses are clear. Orbits are within normal limits. Other: None. IMPRESSION: No acute intracranial abnormality. Partial left mastoid effusion. Electronically Signed   By: Ronney Asters M.D.   On: 01/17/2022 23:00   Result Date: 01/17/2022 CLINICAL DATA:  Fall. EXAM: CT CERVICAL SPINE WITHOUT CONTRAST TECHNIQUE: Multidetector CT imaging of the cervical spine was performed without intravenous contrast. Multiplanar CT image reconstructions were also generated. RADIATION DOSE REDUCTION: This exam was performed according to the departmental dose-optimization program which includes automated exposure control, adjustment of the mA and/or kV according to patient size and/or use of iterative reconstruction technique. COMPARISON:  None. FINDINGS: Alignment: Normal. Skull base and vertebrae: No acute fracture. No primary bone lesion or focal pathologic process. Soft tissues and spinal  canal: No prevertebral fluid or swelling. No visible canal hematoma. Disc levels: Degenerative disc space narrowing is seen at C6-C7. There is mild left and moderate right neural foraminal stenosis at this level. No significant central canal stenosis at any level. Upper chest: Negative. Other: There are emphysematous changes in the lung apices. IMPRESSION: 1. No acute fracture or traumatic subluxation of the cervical spine. Electronically Signed: By: Ronney Asters M.D. On: 01/17/2022 19:43   CT Cervical Spine Wo Contrast  Addendum Date: 01/18/2022   ADDENDUM REPORT: 01/17/2022 23:00 FINDINGS: Brain: No evidence of acute infarction, hemorrhage, hydrocephalus, extra-axial collection or mass lesion/mass effect. Vascular: Atherosclerotic calcifications are present within the cavernous internal carotid arteries. Skull: Normal. Negative for fracture or focal lesion. Sinuses/Orbits: There is partial left mastoid effusion. Visualized paranasal sinuses are clear. Orbits are within normal limits. Other: None. IMPRESSION: No acute intracranial abnormality. Partial left mastoid effusion. Electronically Signed   By: Ronney Asters M.D.   On: 01/17/2022 23:00   Result Date: 01/17/2022 CLINICAL DATA:  Fall. EXAM: CT CERVICAL SPINE WITHOUT  CONTRAST TECHNIQUE: Multidetector CT imaging of the cervical spine was performed without intravenous contrast. Multiplanar CT image reconstructions were also generated. RADIATION DOSE REDUCTION: This exam was performed according to the departmental dose-optimization program which includes automated exposure control, adjustment of the mA and/or kV according to patient size and/or use of iterative reconstruction technique. COMPARISON:  None. FINDINGS: Alignment: Normal. Skull base and vertebrae: No acute fracture. No primary bone lesion or focal pathologic process. Soft tissues and spinal canal: No prevertebral fluid or swelling. No visible canal hematoma. Disc levels: Degenerative disc space  narrowing is seen at C6-C7. There is mild left and moderate right neural foraminal stenosis at this level. No significant central canal stenosis at any level. Upper chest: Negative. Other: There are emphysematous changes in the lung apices. IMPRESSION: 1. No acute fracture or traumatic subluxation of the cervical spine. Electronically Signed: By: Ronney Asters M.D. On: 01/17/2022 19:43     Scheduled Meds:  acetaminophen       acetaminophen  1,000 mg Oral Once   [MAR Hold] allopurinol  300 mg Oral Daily   [MAR Hold] ALPRAZolam  0.25 mg Oral Daily   [MAR Hold] amLODipine  5 mg Oral Daily   chlorhexidine       chlorhexidine       [MAR Hold] gabapentin  900 mg Oral TID   [MAR Hold] HYDROcodone-acetaminophen  1 tablet Oral BID   [MAR Hold] lisinopril  40 mg Oral Daily   [MAR Hold] nicotine  21 mg Transdermal Daily   povidone-iodine  2 application Topical Once   [MAR Hold] rosuvastatin  20 mg Oral QHS   Continuous Infusions:  ceFAZolin      ceFAZolin (ANCEF) IV     lactated ringers       LOS: 1 day    Flora Lipps, MD [facility] 01/18/2022, 10:07 AM

## 2022-01-18 NOTE — Brief Op Note (Signed)
01/18/2022  2:27 PM  PATIENT:  Jonathan Harrington  66 y.o. male  PRE-OPERATIVE DIAGNOSIS:  Right Tibia Fracture  POST-OPERATIVE DIAGNOSIS:  Right Tibia Fracture  PROCEDURE:  Procedure(s): INTRAMEDULLARY (IM) NAIL TIBIAL (Right)  SURGEON:  Surgeon(s) and Role:    Terance Hart, MD - Primary  PHYSICIAN ASSISTANT:   ASSISTANTS: Jesse [place] PA-C   ANESTHESIA:   general  EBL:  20 mL   BLOOD ADMINISTERED:none  DRAINS: none   LOCAL MEDICATIONS USED:  NONE  SPECIMEN:  No Specimen  DISPOSITION OF SPECIMEN:  N/A  COUNTS:  YES  TOURNIQUET:  * No tourniquets in log *  DICTATION: .Dragon Dictation  PLAN OF CARE: Admit for overnight observation  PATIENT DISPOSITION:  PACU - hemodynamically stable.   Delay start of Pharmacological VTE agent (>24hrs) due to surgical blood loss or risk of bleeding: yes

## 2022-01-18 NOTE — Consult Note (Signed)
Reason for Consult: Right tibia fracture Referring Physician: [facility]  Jonathan Harrington is an 66 y.o. male.  HPI: Patient was brought to the hospital for right leg pain.  He had a fall on Monday evening and had leg pain.  He did not present immediately but when is unable to weight-bear came to the ER.  X-rays revealed a displaced tibia fracture with proximal fibula fracture.  Orthopedics was consulted.  He was placed in a long-leg splint per orthopedic instruction and transferred to [facility] for definitive treatment.  He was seen in the preoperative area.  Patient complains of pain in the right leg but otherwise denies pain.  Patient has a history of COPD, ethanol abuse, Anxiety, hypertension and chronic pain.  Past Medical History:  Diagnosis Date   Alcoholism [facility])    Allergy    Anxiety    Arthritis    COPD (chronic obstructive pulmonary disease) ([place])    Diverticulitis 2011   resulted in partial colectomy   Dyspnea    Essential hypertension 06/06/2016   Full dentures    Gout    Hematuria    History of kidney stones    Wears glasses     Past Surgical History:  Procedure Laterality Date   ABDOMINAL AORTOGRAM W/LOWER EXTREMITY Bilateral 07/19/2019   Procedure: ABDOMINAL AORTOGRAM W/LOWER EXTREMITY;  Surgeon: Elam Dutch, MD;  Location: [place] [facility];  Service: Cardiovascular;  Laterality: Bilateral;   COLON SURGERY  2011   partial colectomy   COLONOSCOPY     FINGER ARTHROPLASTY  1990   rt index fx   HERNIA REPAIR     INGUINAL HERNIA REPAIR Left 05/27/2014   Procedure: LEFT INGUINAL HERNIA REPAIR WITH MESH;  Surgeon: Imogene Burn. Georgette Dover, MD;  Location: [place];  Service: General;  Laterality: Left;   INSERTION OF MESH Left 05/27/2014   Procedure: INSERTION OF MESH;  Surgeon: Imogene Burn. Georgette Dover, MD;  Location: [place];  Service: General;  Laterality: Left;    Family History  Problem Relation Age of Onset   Alzheimer's  disease Mother    Cancer Sister    Neuropathy Neg Hx     Social History:  reports that he has been smoking cigarettes. He has been smoking an average of 2 packs per day. He has never used smokeless tobacco. He reports current alcohol use of about 21.0 standard drinks per week. He reports that he does not use drugs.  Allergies:  Allergies  Allergen Reactions   Ibuprofen Swelling and Other (See Comments)    Hands and face swell- had to seek medical treatment    Medications: I have reviewed the patient's current medications.  Results for orders placed or performed during the hospital encounter of 01/17/22 (from the past 48 hour(s))  CBC with Differential     Status: Abnormal   Collection Time: 01/17/22  8:22 PM  Result Value Ref Range   WBC 7.0 4.0 - 10.5 K/uL   RBC 3.46 (L) 4.22 - 5.81 MIL/uL   Hemoglobin 12.2 (L) 13.0 - 17.0 g/dL   HCT 34.5 (L) 39.0 - 52.0 %   MCV 99.7 80.0 - 100.0 fL   MCH 35.3 (H) 26.0 - 34.0 pg   MCHC 35.4 30.0 - 36.0 g/dL   RDW 12.8 11.5 - 15.5 %   Platelets 125 (L) 150 - 400 K/uL   nRBC 0.0 0.0 - 0.2 %   Neutrophils Relative % 70 %   Neutro Abs  4.9 1.7 - 7.7 K/uL   Lymphocytes Relative 22 %   Lymphs Abs 1.5 0.7 - 4.0 K/uL   Monocytes Relative 8 %   Monocytes Absolute 0.6 0.1 - 1.0 K/uL   Eosinophils Relative 0 %   Eosinophils Absolute 0.0 0.0 - 0.5 K/uL   Basophils Relative 0 %   Basophils Absolute 0.0 0.0 - 0.1 K/uL   Immature Granulocytes 0 %   Abs Immature Granulocytes 0.02 0.00 - 0.07 K/uL    Comment: Performed at [facility], [place] [address], [place], [place] [id number]  Basic metabolic panel     Status: Abnormal   Collection Time: 01/17/22  8:22 PM  Result Value Ref Range   Sodium 137 135 - 145 mmol/L   Potassium 4.3 3.5 - 5.1 mmol/L   Chloride 105 98 - 111 mmol/L   CO2 24 22 - 32 mmol/L   Glucose, Bld 99 70 - 99 mg/dL    Comment: Glucose reference range applies only to samples taken after fasting for at least 8 hours.    BUN 17 8 - 23 mg/dL   Creatinine, Ser 1.01 0.61 - 1.24 mg/dL   Calcium 8.8 (L) 8.9 - 10.3 mg/dL   GFR, Estimated >60 >60 mL/min    Comment: (NOTE) Calculated using the CKD-EPI Creatinine Equation (2021)    Anion gap 8 5 - 15    Comment: Performed at [facility], [place] [address], [place], [place] [address]  Hepatic function panel     Status: None   Collection Time: 01/17/22  8:22 PM  Result Value Ref Range   Total Protein 7.2 6.5 - 8.1 g/dL   Albumin 4.0 3.5 - 5.0 g/dL   AST 24 15 - 41 U/L   ALT 14 0 - 44 U/L   Alkaline Phosphatase 53 38 - 126 U/L   Total Bilirubin 0.8 0.3 - 1.2 mg/dL   Bilirubin, Direct 0.2 0.0 - 0.2 mg/dL   Indirect Bilirubin 0.6 0.3 - 0.9 mg/dL    Comment: Performed at [facility], [place] [address], [place], [place] [address]  Resp Panel by RT-PCR (Flu A&B, Covid) Nasopharyngeal Swab     Status: None   Collection Time: 01/17/22  8:24 PM   Specimen: Nasopharyngeal Swab; Nasopharyngeal(NP) swabs in vial transport medium  Result Value Ref Range   SARS Coronavirus 2 by RT PCR NEGATIVE NEGATIVE    Comment: (NOTE) SARS-CoV-2 target nucleic acids are NOT DETECTED.  The SARS-CoV-2 RNA is generally detectable in upper respiratory specimens during the acute phase of infection. The lowest concentration of SARS-CoV-2 viral copies this assay can detect is 138 copies/mL. A negative result does not preclude SARS-Cov-2 infection and should not be used as the sole basis for treatment or other patient management decisions. A negative result may occur with  improper specimen collection/handling, submission of specimen other than nasopharyngeal swab, presence of viral mutation(s) within the areas targeted by this assay, and inadequate number of viral copies(<138 copies/mL). A negative result must be combined with clinical observations, patient history, and epidemiological information. The expected result is Negative.  Fact Sheet for  Patients:  EntrepreneurPulse.com.au  Fact Sheet for Healthcare Providers:  IncredibleEmployment.be  This test is no t yet approved or cleared by the [place] FDA and  has been authorized for detection and/or diagnosis of SARS-CoV-2 by FDA under an Emergency Use Authorization (EUA). This EUA will remain  in effect (meaning this test can be used) for the duration of  the COVID-19 declaration under Section 564(b)(1) of the Act, 21 U.S.C.section 360bbb-3(b)(1), unless the authorization is terminated  or revoked sooner.       Influenza A by PCR NEGATIVE NEGATIVE   Influenza B by PCR NEGATIVE NEGATIVE    Comment: (NOTE) The Xpert Xpress SARS-CoV-2/FLU/RSV plus assay is intended as an aid in the diagnosis of influenza from Nasopharyngeal swab specimens and should not be used as a sole basis for treatment. Nasal washings and aspirates are unacceptable for Xpert Xpress SARS-CoV-2/FLU/RSV testing.  Fact Sheet for Patients: EntrepreneurPulse.com.au  Fact Sheet for Healthcare Providers: IncredibleEmployment.be  This test is not yet approved or cleared by the [place] FDA and has been authorized for detection and/or diagnosis of SARS-CoV-2 by FDA under an Emergency Use Authorization (EUA). This EUA will remain in effect (meaning this test can be used) for the duration of the COVID-19 declaration under Section 564(b)(1) of the Act, 21 U.S.C. section 360bbb-3(b)(1), unless the authorization is terminated or revoked.  Performed at [facility], [place] [address] [place], [place] [address]     DG Tibia/Fibula Right  Result Date: 01/17/2022 CLINICAL DATA:  Fall, pain EXAM: RIGHT TIBIA AND FIBULA - 2 VIEW COMPARISON:  None. FINDINGS: There is an oblique fracture through the right distal tibial shaft with lateral and anterior displacement of the distal fragment. Fracture noted through the fibular  neck with slight lateral displacement of the distal fragment. No subluxation or dislocation. Vascular calcifications noted. IMPRESSION: Distal tibial shaft fracture.  Fibular neck fracture. Electronically Signed   By: Rolm Baptise M.D.   On: 01/17/2022 19:12   DG Ankle Complete Right  Result Date: 01/17/2022 CLINICAL DATA:  Fall EXAM: RIGHT ANKLE - COMPLETE 3+ VIEW COMPARISON:  None. FINDINGS: There is an oblique fracture noted through the distal right tibial shaft with lateral displacement of the distal tibia approximately 1 shaft with. Slight anterior displacement as well. No visible mid to distal fibular abnormality. Ankle joint is maintained. IMPRESSION: Mildly displaced right distal tibial shaft fracture. Electronically Signed   By: Rolm Baptise M.D.   On: 01/17/2022 19:11   CT Head Wo Contrast  Addendum Date: 01/18/2022   ADDENDUM REPORT: 01/17/2022 23:00 FINDINGS: Brain: No evidence of acute infarction, hemorrhage, hydrocephalus, extra-axial collection or mass lesion/mass effect. Vascular: Atherosclerotic calcifications are present within the cavernous internal carotid arteries. Skull: Normal. Negative for fracture or focal lesion. Sinuses/Orbits: There is partial left mastoid effusion. Visualized paranasal sinuses are clear. Orbits are within normal limits. Other: None. IMPRESSION: No acute intracranial abnormality. Partial left mastoid effusion. Electronically Signed   By: Ronney Asters M.D.   On: 01/17/2022 23:00   Result Date: 01/17/2022 CLINICAL DATA:  Fall. EXAM: CT CERVICAL SPINE WITHOUT CONTRAST TECHNIQUE: Multidetector CT imaging of the cervical spine was performed without intravenous contrast. Multiplanar CT image reconstructions were also generated. RADIATION DOSE REDUCTION: This exam was performed according to the departmental dose-optimization program which includes automated exposure control, adjustment of the mA and/or kV according to patient size and/or use of iterative reconstruction  technique. COMPARISON:  None. FINDINGS: Alignment: Normal. Skull base and vertebrae: No acute fracture. No primary bone lesion or focal pathologic process. Soft tissues and spinal canal: No prevertebral fluid or swelling. No visible canal hematoma. Disc levels: Degenerative disc space narrowing is seen at C6-C7. There is mild left and moderate right neural foraminal stenosis at this level. No significant central canal stenosis at any level. Upper chest: Negative. Other: There are emphysematous changes in the  lung apices. IMPRESSION: 1. No acute fracture or traumatic subluxation of the cervical spine. Electronically Signed: By: Ronney Asters M.D. On: 01/17/2022 19:43   CT Cervical Spine Wo Contrast  Addendum Date: 01/18/2022   ADDENDUM REPORT: 01/17/2022 23:00 FINDINGS: Brain: No evidence of acute infarction, hemorrhage, hydrocephalus, extra-axial collection or mass lesion/mass effect. Vascular: Atherosclerotic calcifications are present within the cavernous internal carotid arteries. Skull: Normal. Negative for fracture or focal lesion. Sinuses/Orbits: There is partial left mastoid effusion. Visualized paranasal sinuses are clear. Orbits are within normal limits. Other: None. IMPRESSION: No acute intracranial abnormality. Partial left mastoid effusion. Electronically Signed   By: Ronney Asters M.D.   On: 01/17/2022 23:00   Result Date: 01/17/2022 CLINICAL DATA:  Fall. EXAM: CT CERVICAL SPINE WITHOUT CONTRAST TECHNIQUE: Multidetector CT imaging of the cervical spine was performed without intravenous contrast. Multiplanar CT image reconstructions were also generated. RADIATION DOSE REDUCTION: This exam was performed according to the departmental dose-optimization program which includes automated exposure control, adjustment of the mA and/or kV according to patient size and/or use of iterative reconstruction technique. COMPARISON:  None. FINDINGS: Alignment: Normal. Skull base and vertebrae: No acute fracture. No  primary bone lesion or focal pathologic process. Soft tissues and spinal canal: No prevertebral fluid or swelling. No visible canal hematoma. Disc levels: Degenerative disc space narrowing is seen at C6-C7. There is mild left and moderate right neural foraminal stenosis at this level. No significant central canal stenosis at any level. Upper chest: Negative. Other: There are emphysematous changes in the lung apices. IMPRESSION: 1. No acute fracture or traumatic subluxation of the cervical spine. Electronically Signed: By: Ronney Asters M.D. On: 01/17/2022 19:43    Review of Systems  Constitutional: Negative.   HENT: Negative.    Musculoskeletal:        Right leg pain  All other systems reviewed and are negative. Blood pressure (!) 177/64, pulse 94, temperature 98.4 F (36.9 C), temperature source Oral, resp. rate 20, height 5\' 11"  (1.803 m), weight 78.9 kg, SpO2 96 %. Physical Exam HENT:     Head: Normocephalic.     Mouth/Throat:     Mouth: Mucous membranes are dry.  Eyes:     Extraocular Movements: Extraocular movements intact.  Cardiovascular:     Rate and Rhythm: Normal rate.  Pulmonary:     Effort: Pulmonary effort is normal.  Abdominal:     General: Abdomen is flat.  Musculoskeletal:     Cervical back: Neck supple.     Comments: Right leg in a long-leg splint.  It is externally rotated.  Toes exposed are warm and well perfused.  Endorses sensation light touch about the toes.  Brisk capillary refill.  No tenderness proximal about the knee or thigh.  No tenderness about the foot or forefoot.  No evidence of other joint or extremity injury.  Skin:    General: Skin is warm.  Neurological:     General: No focal deficit present.     Mental Status: He is alert.  Psychiatric:        Mood and Affect: Mood normal.    Assessment/Plan: Patient has a right tibia fracture.  Given his ambulatory status he is indicated for intramedullary nail fixation.  We discussed the procedure and the  risks, benefits and alternatives to this.  He would like to proceed.  Postoperatively he will be touchdown weightbearing.  He will work with physical therapy and likely be deemed suitable for discharge once cleared by them and pain  controlled.  Hospitalist team was consulted in the ER and has agreed to admit the patient postoperatively given history of COPD, ethanol abuse and low energy mechanism fracture.  Erle Crocker 01/18/2022, 10:54 AM

## 2022-01-18 NOTE — Assessment & Plan Note (Addendum)
Currently denies any withdrawal symptoms but daily alcohol intake.  Last drink at 2/523.  Continue CIWA protocol.

## 2022-01-18 NOTE — Assessment & Plan Note (Addendum)
-   Continue Xanax 

## 2022-01-18 NOTE — Assessment & Plan Note (Addendum)
Appears to be stable at this time.  Continue with nebulizers.  Continue nicotine patch for

## 2022-01-18 NOTE — Assessment & Plan Note (Addendum)
Continue Norco, Neurontin from home.  On as needed hydromorphone for severe pain.

## 2022-01-18 NOTE — Assessment & Plan Note (Addendum)
History of cigarette smoking 2 packs/day.  Continue nicotine patch.

## 2022-01-18 NOTE — Anesthesia Procedure Notes (Signed)
Procedure Name: LMA Insertion Date/Time: 01/18/2022 12:41 PM Performed by: Mariea Clonts, CRNA Pre-anesthesia Checklist: Patient identified, Emergency Drugs available, Suction available and Patient being monitored Patient Re-evaluated:Patient Re-evaluated prior to induction Oxygen Delivery Method: Circle System Utilized Preoxygenation: Pre-oxygenation with 100% oxygen Induction Type: IV induction Ventilation: Mask ventilation without difficulty LMA: LMA inserted LMA Size: 4.0 Number of attempts: 1 Airway Equipment and Method: Bite block Placement Confirmation: positive ETCO2 Tube secured with: Tape Dental Injury: Teeth and Oropharynx as per pre-operative assessment

## 2022-01-18 NOTE — Assessment & Plan Note (Addendum)
On Crestor at home.  Continue for now.

## 2022-01-18 NOTE — Assessment & Plan Note (Addendum)
Continue amlodipine lisinopril from home.  Blood pressure seems to be stable.

## 2022-01-18 NOTE — Progress Notes (Signed)
Orthopedic Tech Progress Note Patient Details:  Jonathan Harrington Sep 30, 1956 [id number]  1411 OR RN called requesting for a Large CAM WALKER be applied to patient in PACU   Ortho Devices Type of Ortho Device: CAM walker Ortho Device/Splint Location: RLE Ortho Device/Splint Interventions: Ordered, Application, Adjustment   Post Interventions Patient Tolerated: Well Instructions Provided: Care of device  Jonathan Harrington 01/18/2022, 3:45 PM

## 2022-01-18 NOTE — Transfer of Care (Signed)
Immediate Anesthesia Transfer of Care Note  Patient: Jonathan Harrington  Procedure(s) Performed: INTRAMEDULLARY (IM) NAIL TIBIAL (Right: Leg Lower)  Patient Location: PACU  Anesthesia Type:General  Level of Consciousness: awake, alert  and oriented  Airway & Oxygen Therapy: Patient Spontanous Breathing and Patient connected to nasal cannula oxygen  Post-op Assessment: Report given to RN and Post -op Vital signs reviewed and stable  Post vital signs: Reviewed and stable  Last Vitals:  Vitals Value Taken Time  BP 153/73 01/18/22 1410  Temp    Pulse 96 01/18/22 1410  Resp 13 01/18/22 1410  SpO2 97 % 01/18/22 1410  Vitals shown include unvalidated device data.  Last Pain:  Vitals:   01/18/22 1009  TempSrc:   PainSc: 5          Complications: No notable events documented.

## 2022-01-18 NOTE — Plan of Care (Signed)

## 2022-01-18 NOTE — Anesthesia Preprocedure Evaluation (Addendum)
Anesthesia Evaluation  Patient identified by MRN, date of birth, ID band Patient awake    Reviewed: Allergy & Precautions, NPO status , Patient's Chart, lab work & pertinent test results  History of Anesthesia Complications Negative for: history of anesthetic complications  Airway Mallampati: II  TM Distance: >3 FB Neck ROM: Full    Dental  (+) Edentulous Upper, Edentulous Lower   Pulmonary COPD,  COPD inhaler, Current Smoker,    Pulmonary exam normal        Cardiovascular hypertension, Pt. on medications Normal cardiovascular exam     Neuro/Psych Anxiety negative neurological ROS     GI/Hepatic negative GI ROS, (+)     substance abuse ("6 drinks/day", last EtOH on 01/16/22)  alcohol use,   Endo/Other  negative endocrine ROS  Renal/GU negative Renal ROS  negative genitourinary   Musculoskeletal negative musculoskeletal ROS (+)   Abdominal   Peds  Hematology Hgb 12.2, Plt 125k   Anesthesia Other Findings Day of surgery medications reviewed with patient.  Reproductive/Obstetrics negative OB ROS                            Anesthesia Physical Anesthesia Plan  ASA: 3  Anesthesia Plan: General   Post-op Pain Management: Tylenol PO (pre-op)   Induction:   PONV Risk Score and Plan: 1 and Treatment may vary due to age or medical condition, Ondansetron, Dexamethasone and Midazolam  Airway Management Planned: LMA  Additional Equipment: None  Intra-op Plan:   Post-operative Plan: Extubation in OR  Informed Consent: I have reviewed the patients History and Physical, chart, labs and discussed the procedure including the risks, benefits and alternatives for the proposed anesthesia with the patient or authorized representative who has indicated his/her understanding and acceptance.   Patient has DNR.  Discussed DNR with patient and Suspend DNR.   Dental advisory given  Plan Discussed  with: CRNA  Anesthesia Plan Comments: (DNR status discussed with patient, he would like to be FULL CODE periprocedurally. Regional anesthesia discussed and patient consented, surgeon prefers no regional unless patient has uncontrolled pain in PACU and neuro exam reassuring. Stephannie Peters, MD)      Anesthesia Quick Evaluation

## 2022-01-18 NOTE — ED Notes (Signed)
Patient repositioned in bed for comfort.

## 2022-01-18 NOTE — Plan of Care (Signed)
sdf

## 2022-01-18 NOTE — Assessment & Plan Note (Addendum)
Status post mechanical fall.  Orthopedics  surgical fixation at [facility] on 2/7.

## 2022-01-18 NOTE — Hospital Course (Addendum)
Jonathan Harrington is a 66 y.o. male with medical history significant for COPD, HTN, HLD, gout, anxiety, chronic pain, diverticulitis s/p partial colectomy, alcohol and tobacco use to the hospital after a fall in sustaining right ankle pain.  Patient does have a history of alcohol use almost daily which could have contributed to fall.  Patient was then admitted to hospital  since he was noted to have right displaced tibia and proximal fibular fractures after mechanical fall.  Orthopedics has plans for surgical fixation at [facility] on 01/18/2022.

## 2022-01-19 MED ORDER — OXYCODONE HCL 5 MG PO TABS: 5.0000 mg | ORAL_TABLET | Freq: Four times a day (QID) | ORAL | 0 refills | Status: AC | PRN

## 2022-01-19 MED ORDER — ENOXAPARIN SODIUM 40 MG/0.4ML IJ SOSY: 40.0000 mg | PREFILLED_SYRINGE | INTRAMUSCULAR | 0 refills | Status: DC

## 2022-01-19 NOTE — TOC CAGE-AID Note (Signed)
Transition of Care [facility]) - CAGE-AID Screening   Patient Details  Name: CRANFORD BLESSINGER MRN: [id number] Date of Birth: 10-30-1956  Transition of [facility]) CM/SW Contact:    Imari Reen C Tarpley-Carter, LCSWA Phone Number: 01/19/2022, 10:50 AM   Clinical Narrative: Pt participated in Cage-Aid.  Pt stated she does smokes cigarettes and drinks  ETOH.  Pt was  resources, due to no usage of substance and ETOH.  Pt stated she was not in need of resources, but would accept them.  Brandon Wiechman Tarpley-Carter, MSW, LCSW-A Pronouns:  She/Her/Hers [place] Transitions of Care Clinical Social Worker Direct Number:  [phone] [email or web address]  CAGE-AID Screening:    Have You Ever Felt You Ought to Cut Down on Your Drinking or Drug Use?: No Have People Annoyed You By [employer] Your Drinking Or Drug Use?: No Have You Felt Bad Or Guilty About Your Drinking Or Drug Use?: No Have You Ever Had a Drink or Used Drugs First Thing In The Morning to Steady Your Nerves or to Get Rid of a Hangover?: No CAGE-AID Score: 0  Substance Abuse Education Offered: Yes (Pt smokes cigarettes and drinks ETOH.)  Substance abuse interventions: Transport planner

## 2022-01-19 NOTE — Evaluation (Signed)
Occupational Therapy Evaluation Patient Details Name: Jonathan Harrington MRN: [id number] DOB: 03-07-1956 Today's Date: 01/19/2022   History of Present Illness Pt is a 66 y/o M presenting to ED on 2/6 after fall from home, imaging showed displaced R tibial fracture. S/p IM R tibial IM nail on 2/7. PMH includes alcoholism, COPD, anxiety, HTN and gout.   Clinical Impression   Pt reports independence at baseline with ADLs and functional mobility, reports he plans to go to son's house and will have frequent help from son/daugthter at d/c. Pt set up -min A for ADLs, able to don sock sitting EOB with supervision. Ambulates to bathroom with increased cuing for adhering to TWB precautions. Pt benefits from use of grab bars for sitting/standing from toilet, which he states he has at home. Pt presenting with impairments listed below, will follow acutely. Recommend HHOT at d/c.     Recommendations for follow up therapy are one component of a multi-disciplinary discharge planning process, led by the attending physician.  Recommendations may be updated based on patient status, additional functional criteria and insurance authorization.   Follow Up Recommendations  Home health OT    Assistance Recommended at Discharge Intermittent Supervision/Assistance  Patient can return home with the following A little help with walking and/or transfers;A little help with bathing/dressing/bathroom;Assistance with cooking/housework    Functional Status Assessment  Patient has had a recent decline in their functional status and demonstrates the ability to make significant improvements in function in a reasonable and predictable amount of time.  Equipment Recommendations  Other (comment);Tub/shower seat (RW)    Recommendations for Other Services PT consult     Precautions / Restrictions Precautions Precautions: Fall Restrictions Weight Bearing Restrictions: Yes RLE Weight Bearing: Touchdown weight bearing Other  Position/Activity Restrictions: in CAM boot      Mobility Bed Mobility Overal bed mobility: Needs Assistance Bed Mobility: Supine to Sit     Supine to sit: Min guard     General bed mobility comments: increased time to get RLE to EOB    Transfers Overall transfer level: Needs assistance Equipment used: Rolling walker (2 wheels) Transfers: Sit to/from Stand Sit to Stand: Min guard           General transfer comment: increased cuing to adhere to TDWB precautions      Balance Overall balance assessment: Needs assistance Sitting-balance support: Bilateral upper extremity supported, Feet supported Sitting balance-Leahy Scale: Normal     Standing balance support: During functional activity, Reliant on assistive device for balance Standing balance-Leahy Scale: Fair Standing balance comment: able to stand statically without LOB                           ADL either performed or assessed with clinical judgement   ADL Overall ADL's : Needs assistance/impaired Eating/Feeding: Set up;Sitting   Grooming: Set up;Sitting   Upper Body Bathing: Set up;Sitting   Lower Body Bathing: Minimal assistance;Sitting/lateral leans   Upper Body Dressing : Set up;Sitting   Lower Body Dressing: Set up;Sit to/from stand Lower Body Dressing Details (indicate cue type and reason): dons sock sitting EOB Toilet Transfer: Min guard;Rolling walker (2 wheels);Ambulation;Regular Social worker and Hygiene: Min guard;Sit to/from stand Toileting - Clothing Manipulation Details (indicate cue type and reason): able to manage clothing prior to sitting on commode     Functional mobility during ADLs: Min guard;Rolling walker (2 wheels)       Vision   Vision  Assessment?: No apparent visual deficits     Perception     Praxis      Pertinent Vitals/Pain Pain Assessment Pain Assessment: No/denies pain     Hand Dominance     Extremity/Trunk Assessment  Upper Extremity Assessment Upper Extremity Assessment: Overall WFL for tasks assessed   Lower Extremity Assessment Lower Extremity Assessment: Defer to PT evaluation   Cervical / Trunk Assessment Cervical / Trunk Assessment: Normal   Communication Communication Communication: No difficulties   Cognition Arousal/Alertness: Awake/alert Behavior During Therapy: WFL for tasks assessed/performed Overall Cognitive Status: Within Functional Limits for tasks assessed                                       General Comments  VSS on RA    Exercises     Shoulder Instructions      Home Living Family/patient expects to be discharged to:: Private residence Living Arrangements: Children Available Help at Discharge: Available 24 hours/day Type of Home: House Home Access: Level entry     Home Layout: Two level;Able to live on main level with bedroom/bathroom     Bathroom Shower/Tub: Chief Strategy Officer: Standard Bathroom Accessibility: Yes How Accessible: Accessible via walker Home Equipment: Grab bars - tub/shower;Grab bars - toilet          Prior Functioning/Environment Prior Level of Function : Independent/Modified Independent;Driving;Working/employed             Mobility Comments: no AD use ADLs Comments: does IADLs, works at [employer] account [employer] Problem List: Decreased strength;Decreased range of motion;Decreased activity tolerance;Impaired balance (sitting and/or standing);Decreased knowledge of use of DME or AE;Decreased knowledge of precautions;Decreased safety awareness      OT Treatment/Interventions: Self-care/ADL training;Therapeutic exercise;DME and/or AE instruction;Therapeutic activities;Balance training;Patient/family education    OT Goals(Current goals can be found in the care plan section) Acute Rehab OT Goals Patient Stated Goal: to go home OT Goal Formulation: With patient Time For Goal Achievement:  02/02/22 Potential to Achieve Goals: Good ADL Goals Pt Will Perform Lower Body Dressing: with min assist;sit to/from stand Pt Will Transfer to Toilet: with supervision;ambulating;regular height toilet Pt Will Perform Tub/Shower Transfer: with min assist;rolling walker;shower seat;ambulating  OT Frequency: Min 2X/week    Co-evaluation              AM-PAC OT "6 Clicks" Daily Activity     Outcome Measure Help from another person eating meals?: None Help from another person taking care of personal grooming?: A Little Help from another person toileting, which includes using toliet, bedpan, or urinal?: A Little Help from another person bathing (including washing, rinsing, drying)?: A Little Help from another person to put on and taking off regular upper body clothing?: None Help from another person to put on and taking off regular lower body clothing?: A Lot 6 Click Score: 19   End of Session Equipment Utilized During Treatment: Gait belt;Rolling walker (2 wheels) Nurse Communication: Mobility status  Activity Tolerance: Patient tolerated treatment well Patient left: Other (comment) (Handoff in room to PT)  OT Visit Diagnosis: Unsteadiness on feet (R26.81);Other abnormalities of gait and mobility (R26.89);Muscle weakness (generalized) (M62.81);History of falling (Z91.81)                Time: 6484-7207 OT Time Calculation (min): 14 min Charges:  OT General Charges $OT Visit: 1 Visit OT Evaluation $OT  Eval Low Complexity: 1 Low  Alfonzo Beers, OTD, OTR/L Acute Rehab [phone]   Mayer Masker 01/19/2022, 9:36 AM

## 2022-01-19 NOTE — Progress Notes (Signed)
Pt ready to discharge, waiting on ride.

## 2022-01-19 NOTE — Op Note (Signed)
Jonathan Harrington male 66 y.o. 01/18/2022  PreOperative Diagnosis: Right closed tibia fracture  PostOperative Diagnosis: same  PROCEDURE: Intramedullary nail of right tibia fracture  SURGEON: [name], MD  ASSISTANT: [name], PA-C: [name] was necessary to help with prep, drape, position of the patient as well as traction for fracture reduction and placement of hardware.  ANESTHESIA: General with LMA  FINDINGS: Displaced distal third tibia shaft fracture  IMPLANTS: Arthrex suprapatellar tibial nail  INDICATIONS:65 y.o. malehad a fall at home sustaining the above injury.  He was seen in the emergency department and given the amount of displacement and fracture pattern he was indicated for surgery.  Patient does have a history of alcohol abuse which may have contributed to his injury.   Patient understood the risks, benefits and alternatives to surgery which include but are not limited to wound healing complications, infection, nonunion, malunion, need for further surgery as well as damage to surrounding structures. They also understood the potential for continued pain in that there were no guarantees of acceptable outcome After weighing these risks the patient opted to proceed with surgery.  PROCEDURE:  Patient was identified in the preoperative holding area.  The  right leg was marked by myself.  Consent was signed by myself and the patient.  Right lower extremity was marked by myself.  Patient was seen by anesthesia and taken to the operative suite and placed supine on the operative table.  General LMAanesthesia was induced without difficulty.  Preoperative antibiotics were given.  Bump was placed on the operative hip and bone foam was used.  The operative extremity was then prepped and draped in the usual sterile fashion.  Surgical timeout was performed.  We began with x-rays of the fracture site.  The fracture was significantly displaced and rotated.   The fracture site was  manipulated and more acceptably reduced.  2 small stab incisions were made anteriorly and laterally about the fracture site.  Then a Weber clamp was used to clamp across the fracture site to maintain provisional reduction.  There is some residual varus and rotation which was corrected. X-ray fluoroscopy confirmed appropriate reduction of the fracture site.  We then turned our attention to the proximal tibia.  An incision was made along the suprapatellar region of the thigh.  It was taken sharply down through skin and subcutaneous tissue.  We were then able to dissected down to the trochlear groove.  The inserter cannula was placed. Using all in the appropriate starting position was confirmed on fluoroscopy the proximal portion of the tibia was entered.  Then the bead tip guidewire was placed down through this opening to the distal tibia and confirmed to be in acceptable position on fluoroscopy.  The length was measured.  The fracture maintained reduction during this process.  Then the opening reamer was used.  Then the tibial canal was sequentially reamed up to 10.57mm.  A andmm nail was chosen.  Then the nail was placed down across the fracture site without difficulty.  Maintenance of reduction was obtained.  Proximally 2 interlocking screws were then placed through the guide without difficulty.  Distally the distal interlocking screws were placed using perfect circle technique.  The clamps were removed from the fracture site and reduction was well-maintained.  Final fluoroscopic images were obtained.  The wounds were then irrigated with normal saline.   The tissue was closed in a layered fashion using 3-0 Monocryl and staples.  Soft dressing was placed.  The patient was awakened  from anesthesia and taken recovery in stable condition.  There were no complications.  POST OPERATIVE INSTRUCTIONS: Touchdown weightbearing to operative extremity in a walking boot Okay to begin DVT prophylaxis postop day  1 Will be evaluated by physical therapy and occupational therapy for disposition purposes Patient will follow up with me in 2 weeks for x-rays of the left tibia and suture removal if appropriate    TOURNIQUET TIME: No tourniquet was used  BLOOD LOSS:  less than 100 mL         DRAINS: none         SPECIMEN: none       COMPLICATIONS:  * No complications entered in OR log *         Disposition: PACU - hemodynamically stable.         Condition: stable

## 2022-01-19 NOTE — Discharge Instructions (Signed)
DR. Susa Simmonds FOOT & ANKLE SURGERY POST-OP INSTRUCTIONS   Pain Management The numbing medicine and your leg will last around 18 hours, take a dose of your pain medicine as soon as you feel it wearing off to avoid rebound pain. Keep your foot elevated above heart level.  Make sure that your heel hangs free ('floats'). Take all prescribed medication as directed. If taking narcotic pain medication you may want to use an over-the-counter stool softener to avoid constipation. You may take over-the-counter NSAIDs (ibuprofen, naproxen, etc.) as well as over-the-counter acetaminophen as directed on the packaging as a supplement for your pain and may also use it to wean away from the prescription medication.  Activity Touch down weightbearing in tall boot with walker or crutches  Keep dressing intact  First Postoperative Visit Your first postop visit will be at least 2 weeks after surgery.  This should be scheduled when you schedule surgery. If you do not have a postoperative visit scheduled please call [phone] to schedule an appointment. At the appointment your incision will be evaluated for suture removal, x-rays will be obtained if necessary.  General Instructions Swelling is very common after foot and ankle surgery.  It often takes 3 months for the foot and ankle to begin to feel comfortable.  Some amount of swelling will persist for 6-12 months. DO NOT change the dressing.  If there is a problem with the dressing (too tight, loose, gets wet, etc.) please contact Dr. Donnie Mesa office. DO NOT get the dressing wet.  For showers you can use an over-the-counter cast cover or wrap a washcloth around the top of your dressing and then cover it with a plastic bag and tape it to your leg. DO NOT soak the incision (no tubs, pools, bath, etc.) until you have approval from Dr. Susa Simmonds.  Contact Dr. Garret Reddish office or go to Emergency Room if: Temperature above 101 F. Increasing pain that is unresponsive to pain  medication or elevation Excessive redness or swelling in your foot Dressing problems - excessive bloody drainage, looseness or tightness, or if dressing gets wet Develop pain, swelling, warmth, or discoloration of your calf

## 2022-01-19 NOTE — Progress Notes (Signed)
° ° ° °  Jonathan Harrington is a 66 y.o. male   Orthopaedic diagnosis: Right tibia fracture status post IM fixation right tibia   Subjective: Patient appears comfortable in bed.  He is eating.  Reports pain is well controlled. He feels well. He is very much looking forward to being discharged home.  Reports he was ambulatory and touchdown weightbearing on the operative lower extremity last night without significant difficulty.  Denies radiating pain.  No other concerns.  Objectyive: Vitals:   01/18/22 2212 01/19/22 0407  BP: (!) 120/56 121/61  Pulse: 93 88  Resp: 17 19  Temp: 98.8 F (37.1 C) 98 F (36.7 C)  SpO2: 91% 90%     Exam: Awake and alert Respirations even and unlabored No acute distress  Examination of the right lower extremity demonstrates intact, clean, and dry dressing and Ace wrap with walking boot placed appropriately.  He is able to wiggle his toes without difficulty. Sensation to light touch about the toes and the dorsum of the foot is intact.  Mild appropriate swelling noted throughout.  Right lower extremity compartments are soft.  +1 DP pulse.  The foot is warm and well-perfused distally.  Assessment: Postop day 1 status post IM nail fixation right tibia fracture   Plan: Patient appears to be doing well.  His pain is well controlled.  He is eating.  He has been ambulatory with use of a walker.  He is pending PT/OT evaluation. He is suitable for discharge from an orthopedic standpoint.  He would like to be discharged home.  He will be touchdown weightbearing in tall walking boot with use of crutches or walker.  He will follow-up in our office at [facility] orthopedics with Dr. Susa Simmonds in 2 weeks time.  We will plan on Lovenox for DVT prophylaxis.  I will also send in pain medication to his pharmacy for breakthrough pain only, as he is already on chronic narcotics.   Kash Mothershead J. [place], PA-C

## 2022-01-19 NOTE — TOC Transition Note (Addendum)
Transition of Care [facility]) - CM/SW Discharge Note   Patient Details  Name: Jonathan Harrington MRN: [id number] Date of Birth: Jan 26, 1956  Transition of [facility] CM/SW Contact:  Epifanio Lesches, RN Phone Number: 01/19/2022, 10:13 AM   Clinical Narrative:    Patient will DC to: home Anticipated DC date:01/19/2022 Family notified:yes Transport by: car ( son )   Per MD patient ready for DC today. RN, patient, and patient's family notified of DC. Order noted for home health services. Pt agreeable to services. States will recover with girl friend Jonathan Harrington [address] [place], [phone]. Pt without preference for home health services. Referral made with [facility] , acceptance pending. DME noted , rolling walker and 3in1/bsc. Pt declined [facility]. Referral made with [facility]. [facility] to deliver RW to bedside prior to d/c. Pt  with no problems affording copay on Rx meds.  Post hospital f/u noted on AVS. Son to provide transportation to home.  RNCM will sign off for now as intervention is no longer needed. Please consult [place] again if new needs arise.   2//07/2022 1041   [place] with [facility] informed NCM pt accepted for [facility] services.  Final next level of care: Home w Home Health Services Barriers to Discharge: No Barriers Identified   Patient Goals and CMS Choice     Choice offered to / list presented to : Patient  Discharge Placement                       Discharge Plan and Services                DME Arranged: 3-N-1, Walker rolling (Pt  declined [facility]) Date DME Agency Contacted: 01/19/22 Time DME Agency Contacted: 1009 Representative spoke with at DME Agency: Velna Hatchet HH Arranged: PT HH Agency: [facility] Date [facility] Agency Contacted: 01/19/22 Time HH Agency Contacted: 1011 Representative spoke with at [facility] Agency: Tiffany  Social Determinants of Health (SDOH) Interventions     Readmission Risk Interventions No flowsheet data  found.

## 2022-01-19 NOTE — Discharge Summary (Signed)
Physician Discharge Summary   Patient: Jonathan Harrington MRN: [id number] DOB: May 24, 1956  Admit date:     01/17/2022  Discharge date: 01/19/22  Discharge Physician: Joseph Art   PCP: Pearline Cables, MD   Recommendations at discharge:    touchdown weightbearing in tall walking boot with use of crutches or walker.  He will follow-up in our office at [facility] orthopedics with Dr. Susa Simmonds in 2 weeks time.  We will plan on Lovenox for DVT prophylaxis.  Home health  Discharge Diagnoses: Principal Problem:   Closed fracture of right tibia and fibula Active Problems:   Anxiety   Essential hypertension   COPD (chronic obstructive pulmonary disease) (HCC)   Alcohol use   Chronic pain   Hyperlipidemia   Tobacco use  Resolved Problems:   * No resolved hospital problems. Jonathan Harrington Course: Jonathan Harrington is a 66 y.o. male with medical history significant for COPD, HTN, HLD, gout, anxiety, chronic pain, diverticulitis s/p partial colectomy, alcohol and tobacco use to the hospital after a fall in sustaining right ankle pain.  Patient does have a history of alcohol use almost daily which could have contributed to fall.  Patient was then admitted to hospital  since he was noted to have right displaced tibia and proximal fibular fractures after mechanical fall.  Orthopedics has plans for surgical fixation at [facility] on 01/18/2022.  Assessment and Plan: * Closed fracture of right tibia and fibula- (present on admission) Status post mechanical fall.  Orthopedics  surgical fixation at [facility] on 2/7.     Tobacco use- (present on admission) History of cigarette smoking 2 packs/day.  Continue nicotine patch.  Hyperlipidemia- (present on admission) On Crestor at home.  Continue for now.  Chronic pain- (present on admission) Continue Norco, Neurontin from home.  On as needed hydromorphone for severe pain.  Alcohol use- (present on admission) Currently denies any withdrawal symptoms but  daily alcohol intake.  Last drink at 2/523.  Continue CIWA protocol.  COPD (chronic obstructive pulmonary disease) (HCC)- (present on admission) Appears to be stable at this time.  Continue with nebulizers.  Continue nicotine patch for  Essential hypertension- (present on admission) Continue amlodipine lisinopril from home.  Blood pressure seems to be stable.  Anxiety- (present on admission) Continue Xanax.       Consultants: ortho  Disposition: Home Diet recommendation:  Discharge Diet Orders (From admission, onward)     Start     Ordered   01/19/22 0000  Diet - low sodium heart healthy        01/19/22 0901             DISCHARGE MEDICATION: Allergies as of 01/19/2022       Reactions   Ibuprofen Swelling, Other (See Comments)   Hands and face swell- had to seek medical treatment.  No issue with celecoxib        Medication List     TAKE these medications    albuterol 108 (90 Base) MCG/ACT inhaler Commonly known as: VENTOLIN HFA Inhale 2 puffs into the lungs every 6 (six) hours as needed for wheezing or shortness of breath.   allopurinol 300 MG tablet Commonly known as: ZYLOPRIM Take 1 tablet (300 mg total) by mouth daily.   ALPRAZolam 0.25 MG tablet Commonly known as: XANAX TAKE 1 TABLET SCHEDULED EVERY MORNING AND MAY REPEAT IN THE EVENING AS NEEDED FOR ANXIETY What changed:  how much to take how to take this when to  take this additional instructions   amLODipine 5 MG tablet Commonly known as: NORVASC Take 1 tablet (5 mg total) by mouth daily.   ascorbic Acid 500 MG Cpcr Commonly known as: VITAMIN C Take 500 mg by mouth daily.   diphenhydrAMINE HCl (Sleep) 50 MG Caps Take 50 mg by mouth daily with supper.   enoxaparin 40 MG/0.4ML injection Commonly known as: LOVENOX Inject 0.4 mLs (40 mg total) into the skin daily for 14 days.   gabapentin 300 MG capsule Commonly known as: NEURONTIN TAKE 3 CAPSULES BY MOUTH THREE TIMES DAILY    HYDROcodone-acetaminophen 7.5-325 MG tablet Commonly known as: NORCO Take 1 tablet by mouth every 8 (eight) hours as needed for moderate pain. What changed:  when to take this additional instructions   lisinopril 40 MG tablet Commonly known as: ZESTRIL Take 1 tablet (40 mg total) by mouth daily.   oxyCODONE 5 MG immediate release tablet Commonly known as: Roxicodone Take 1 tablet (5 mg total) by mouth every 6 (six) hours as needed for up to 5 days for severe pain or breakthrough pain.   rosuvastatin 20 MG tablet Commonly known as: CRESTOR Take 1 tablet (20 mg total) by mouth daily. What changed: when to take this   sildenafil 100 MG tablet Commonly known as: VIAGRA Take 100 mg by mouth daily as needed for erectile dysfunction.   Testosterone 1.62 % Gel Place 1 Pump onto the skin in the morning.   triamcinolone cream 0.1 % Commonly known as: KENALOG Apply 1 application topically 2 (two) times daily as needed (eczema on leg). What changed: reasons to take this   vitamin B-12 1000 MCG tablet Commonly known as: CYANOCOBALAMIN Take 1,000-2,000 mcg by mouth daily.   Vitamin D3 50 MCG (2000 UT) Tabs Take 2,000 Units by mouth daily.               Durable Medical Equipment  (From admission, onward)           Start     Ordered   01/19/22 1006  For home use only DME Walker rolling  Once       Question Answer Comment  Walker: With 5 Inch Wheels   Patient needs a walker to treat with the following condition Fx      01/19/22 1006   01/19/22 1006  For home use only DME 3 n 1  Once        01/19/22 1006              Discharge Care Instructions  (From admission, onward)           Start     Ordered   01/19/22 0000  Touch down weight bearing       Comments: In tall walking boot with walker or crutches  Question:  Laterality  Answer:  right   01/19/22 0901            Follow-up Information     Terance Hart, MD Follow up in 2 week(s).    Specialty: Orthopedic Surgery Contact information: [address] [place] [address] [phone]         Pearline Cables, MD Follow up.   Specialty: Family Medicine Contact information: [address] [place] [address] [phone]         [facility] Follow up.   Specialty: Home Health Services Why: home health PT services will be provided by [facility], start of care within  48 hours post  discharge Contact information: [address] [place] [id number] [phone]                 Discharge Exam: Ceasar MonsFiled Weights   01/18/22 0949  Weight: 78.9 kg     Condition at discharge: good  The results of significant diagnostics from this hospitalization (including imaging, microbiology, ancillary and laboratory) are listed below for reference.   Imaging Studies: DG Tibia/Fibula Right  Result Date: 01/18/2022 CLINICAL DATA:  Intramedullary rod.  Tibial fracture. EXAM: RIGHT TIBIA AND FIBULA - 2 VIEW COMPARISON:  Right tibia and fibular radiographs 01/17/2022. FINDINGS: The oblique midshaft fracture is reduced. Intramedullary rod is in place. Two proximal and 2 distal interlocking screws are present. Comminuted proximal fibular fracture is again seen. IMPRESSION: 1. Interval ORIF of a midshaft right tibial fracture. Intramedullary rod is in place with proximal and distal interlocking screws. No radiographic evidence for complication. 2. Comminuted proximal fibular fracture. Electronically Signed   By: Marin Robertshristopher  Mattern M.D.   On: 01/18/2022 14:01   DG Tibia/Fibula Right  Result Date: 01/17/2022 CLINICAL DATA:  Fall, pain EXAM: RIGHT TIBIA AND FIBULA - 2 VIEW COMPARISON:  None. FINDINGS: There is an oblique fracture through the right distal tibial shaft with lateral and anterior displacement of the distal fragment. Fracture noted through the fibular neck with slight lateral displacement of the distal fragment. No subluxation or  dislocation. Vascular calcifications noted. IMPRESSION: Distal tibial shaft fracture.  Fibular neck fracture. Electronically Signed   By: Charlett NoseKevin  Dover M.D.   On: 01/17/2022 19:12   DG Ankle Complete Right  Result Date: 01/17/2022 CLINICAL DATA:  Fall EXAM: RIGHT ANKLE - COMPLETE 3+ VIEW COMPARISON:  None. FINDINGS: There is an oblique fracture noted through the distal right tibial shaft with lateral displacement of the distal tibia approximately 1 shaft with. Slight anterior displacement as well. No visible mid to distal fibular abnormality. Ankle joint is maintained. IMPRESSION: Mildly displaced right distal tibial shaft fracture. Electronically Signed   By: Charlett NoseKevin  Dover M.D.   On: 01/17/2022 19:11   CT Head Wo Contrast  Addendum Date: 01/18/2022   ADDENDUM REPORT: 01/17/2022 23:00 FINDINGS: Brain: No evidence of acute infarction, hemorrhage, hydrocephalus, extra-axial collection or mass lesion/mass effect. Vascular: Atherosclerotic calcifications are present within the cavernous internal carotid arteries. Skull: Normal. Negative for fracture or focal lesion. Sinuses/Orbits: There is partial left mastoid effusion. Visualized paranasal sinuses are clear. Orbits are within normal limits. Other: None. IMPRESSION: No acute intracranial abnormality. Partial left mastoid effusion. Electronically Signed   By: Darliss CheneyAmy  Guttmann M.D.   On: 01/17/2022 23:00   Result Date: 01/17/2022 CLINICAL DATA:  Fall. EXAM: CT CERVICAL SPINE WITHOUT CONTRAST TECHNIQUE: Multidetector CT imaging of the cervical spine was performed without intravenous contrast. Multiplanar CT image reconstructions were also generated. RADIATION DOSE REDUCTION: This exam was performed according to the departmental dose-optimization program which includes automated exposure control, adjustment of the mA and/or kV according to patient size and/or use of iterative reconstruction technique. COMPARISON:  None. FINDINGS: Alignment: Normal. Skull base and  vertebrae: No acute fracture. No primary bone lesion or focal pathologic process. Soft tissues and spinal canal: No prevertebral fluid or swelling. No visible canal hematoma. Disc levels: Degenerative disc space narrowing is seen at C6-C7. There is mild left and moderate right neural foraminal stenosis at this level. No significant central canal stenosis at any level. Upper chest: Negative. Other: There are emphysematous changes in the lung apices. IMPRESSION: 1. No acute fracture or  traumatic subluxation of the cervical spine. Electronically Signed: By: Darliss CheneyAmy  Guttmann M.D. On: 01/17/2022 19:43   CT Cervical Spine Wo Contrast  Addendum Date: 01/18/2022   ADDENDUM REPORT: 01/17/2022 23:00 FINDINGS: Brain: No evidence of acute infarction, hemorrhage, hydrocephalus, extra-axial collection or mass lesion/mass effect. Vascular: Atherosclerotic calcifications are present within the cavernous internal carotid arteries. Skull: Normal. Negative for fracture or focal lesion. Sinuses/Orbits: There is partial left mastoid effusion. Visualized paranasal sinuses are clear. Orbits are within normal limits. Other: None. IMPRESSION: No acute intracranial abnormality. Partial left mastoid effusion. Electronically Signed   By: Darliss CheneyAmy  Guttmann M.D.   On: 01/17/2022 23:00   Result Date: 01/17/2022 CLINICAL DATA:  Fall. EXAM: CT CERVICAL SPINE WITHOUT CONTRAST TECHNIQUE: Multidetector CT imaging of the cervical spine was performed without intravenous contrast. Multiplanar CT image reconstructions were also generated. RADIATION DOSE REDUCTION: This exam was performed according to the departmental dose-optimization program which includes automated exposure control, adjustment of the mA and/or kV according to patient size and/or use of iterative reconstruction technique. COMPARISON:  None. FINDINGS: Alignment: Normal. Skull base and vertebrae: No acute fracture. No primary bone lesion or focal pathologic process. Soft tissues and spinal  canal: No prevertebral fluid or swelling. No visible canal hematoma. Disc levels: Degenerative disc space narrowing is seen at C6-C7. There is mild left and moderate right neural foraminal stenosis at this level. No significant central canal stenosis at any level. Upper chest: Negative. Other: There are emphysematous changes in the lung apices. IMPRESSION: 1. No acute fracture or traumatic subluxation of the cervical spine. Electronically Signed: By: Darliss CheneyAmy  Guttmann M.D. On: 01/17/2022 19:43   DG C-Arm 1-60 Min-No Report  Result Date: 01/18/2022 Fluoroscopy was utilized by the requesting physician.  No radiographic interpretation.    Microbiology: Results for orders placed or performed during the hospital encounter of 01/17/22  Resp Panel by RT-PCR (Flu A&B, Covid) Nasopharyngeal Swab     Status: None   Collection Time: 01/17/22  8:24 PM   Specimen: Nasopharyngeal Swab; Nasopharyngeal(NP) swabs in vial transport medium  Result Value Ref Range Status   SARS Coronavirus 2 by RT PCR NEGATIVE NEGATIVE Final    Comment: (NOTE) SARS-CoV-2 target nucleic acids are NOT DETECTED.  The SARS-CoV-2 RNA is generally detectable in upper respiratory specimens during the acute phase of infection. The lowest concentration of SARS-CoV-2 viral copies this assay can detect is 138 copies/mL. A negative result does not preclude SARS-Cov-2 infection and should not be used as the sole basis for treatment or other patient management decisions. A negative result may occur with  improper specimen collection/handling, submission of specimen other than nasopharyngeal swab, presence of viral mutation(s) within the areas targeted by this assay, and inadequate number of viral copies(<138 copies/mL). A negative result must be combined with clinical observations, patient history, and epidemiological information. The expected result is Negative.  Fact Sheet for Patients:  BloggerCourse.comhttps://www.fda.gov/media/152166/download  Fact  Sheet for Healthcare Providers:  SeriousBroker.ithttps://www.fda.gov/media/152162/download  This test is no t yet approved or cleared by the [place] FDA and  has been authorized for detection and/or diagnosis of SARS-CoV-2 by FDA under an Emergency Use Authorization (EUA). This EUA will remain  in effect (meaning this test can be used) for the duration of the COVID-19 declaration under Section 564(b)(1) of the Act, 21 U.S.C.section 360bbb-3(b)(1), unless the authorization is terminated  or revoked sooner.       Influenza A by PCR NEGATIVE NEGATIVE Final   Influenza B by PCR NEGATIVE NEGATIVE Final  Comment: (NOTE) The Xpert Xpress SARS-CoV-2/FLU/RSV plus assay is intended as an aid in the diagnosis of influenza from Nasopharyngeal swab specimens and should not be used as a sole basis for treatment. Nasal washings and aspirates are unacceptable for Xpert Xpress SARS-CoV-2/FLU/RSV testing.  Fact Sheet for Patients: BloggerCourse.com  Fact Sheet for Healthcare Providers: SeriousBroker.it  This test is not yet approved or cleared by the [place] FDA and has been authorized for detection and/or diagnosis of SARS-CoV-2 by FDA under an Emergency Use Authorization (EUA). This EUA will remain in effect (meaning this test can be used) for the duration of the COVID-19 declaration under Section 564(b)(1) of the Act, 21 U.S.C. section 360bbb-3(b)(1), unless the authorization is terminated or revoked.  Performed at [facility], [address], [place], [place] [address]   Surgical pcr screen     Status: None   Collection Time: 01/18/22 10:39 AM   Specimen: Nasal Mucosa; Nasal Swab  Result Value Ref Range Status   MRSA, PCR NEGATIVE NEGATIVE Final   Staphylococcus aureus NEGATIVE NEGATIVE Final    Comment: (NOTE) The Xpert SA Assay (FDA approved for NASAL specimens in patients 82 years of age and older), is one  component of a comprehensive surveillance program. It is not intended to diagnose infection nor to guide or monitor treatment. Performed at [facility], [address], [place], [place] [address]     Labs: CBC: Recent Labs  Lab 01/17/22 2022 01/18/22 1828  WBC 7.0 6.3  NEUTROABS 4.9  --   HGB 12.2* 10.9*  HCT 34.5* 32.7*  MCV 99.7 100.9*  PLT 125* 123*   Basic Metabolic Panel: Recent Labs  Lab 01/17/22 2022 01/18/22 1828  NA 137 137  K 4.3 4.0  CL 105 103  CO2 24 25  GLUCOSE 99 156*  BUN 17 9  CREATININE 1.01 1.02  CALCIUM 8.8* 8.7*   Liver Function Tests: Recent Labs  Lab 01/17/22 2022  AST 24  ALT 14  ALKPHOS 53  BILITOT 0.8  PROT 7.2  ALBUMIN 4.0   CBG: No results for input(s): GLUCAP in the last 168 hours.  Discharge time spent: greater than 30 minutes.  Signed: Joseph Art, DO [facility] 01/19/2022

## 2022-01-19 NOTE — Evaluation (Signed)
Physical Therapy Evaluation Patient Details Name: Jonathan Harrington MRN: [id number] DOB: 10-07-1956 Today's Date: 01/19/2022  History of Present Illness  Pt is a 66 y/o M presenting to ED on 2/6 after fall from home, imaging showed displaced R tibial fracture. S/p IM R tibial IM nail on 2/7. PMH includes alcoholism, COPD, anxiety, HTN and gout.  Clinical Impression  Pt was previously independent with all functional mobility and ADLs prior to recent fall resulting in R tib/fib fx.  Pt currently requiring supervision with ambulation with use of RW primarily to maintain weightbearing restrictions and for inc safety awareness with use of AD.  Pt demos dec R LE strength/ROM, gait deviations, and dec activity tolerance and would benefit from skilled PT to address deficits indicated in initial eval.  Pt is safe to return home with [facility] PT and assist from son/daughter.     Recommendations for follow up therapy are one component of a multi-disciplinary discharge planning process, led by the attending physician.  Recommendations may be updated based on patient status, additional functional criteria and insurance authorization.  Follow Up Recommendations Home health PT    Assistance Recommended at Discharge Set up Supervision/Assistance  Patient can return home with the following  A little help with bathing/dressing/bathroom;Assistance with cooking/housework;A little help with walking and/or transfers;Assist for transportation    Equipment Recommendations Rolling walker (2 wheels);BSC/3in1  Recommendations for Other Services       Functional Status Assessment Patient has had a recent decline in their functional status and demonstrates the ability to make significant improvements in function in a reasonable and predictable amount of time.     Precautions / Restrictions Precautions Precautions: Fall Restrictions Weight Bearing Restrictions: Yes RLE Weight Bearing: Touchdown weight bearing Other  Position/Activity Restrictions: in CAM boot      Mobility  Bed Mobility                 Patient Response: Cooperative  Transfers Overall transfer level: Needs assistance Equipment used: Rolling walker (2 wheels) Transfers: Sit to/from Stand Sit to Stand: Supervision           General transfer comment: Pt requires VCs for safety with transfer training as he has tendency not to fully back up to chair prior to attempting to sit.  Practiced STS x 2.  Pt encouraged to extend R LE out in front during transfers to dec weightbearing through operative leg and to push up from standing surface and not to pull on RW.    Ambulation/Gait Ambulation/Gait assistance: Supervision Gait Distance (Feet): 100 Feet Assistive device: Rolling walker (2 wheels) Gait Pattern/deviations: Step-to pattern       General Gait Details: Pt amb with supervision only with VCs to inc weightbearing through UEs to limit weightbearing through operative LE.  Pt is also reminded of appropriate sequencing throughout gait training.  Demos good balance and cadence with activity.  Stairs            Wheelchair Mobility    Modified Rankin (Stroke Patients Only)       Balance Overall balance assessment: Modified Independent   Sitting balance-Leahy Scale: Good     Standing balance support: Bilateral upper extremity supported, During functional activity Standing balance-Leahy Scale: Good                               Pertinent Vitals/Pain Pain Assessment Pain Assessment: No/denies pain    Home Living Family/patient expects to  be discharged to:: Private residence Living Arrangements: Alone Available Help at Discharge: Family;Available 24 hours/day (Will stay at son's house upon D/C) Type of Home: House Home Access: Level entry (Stairs at his home, but will stay at son's home - no stairs)       Home Layout: One level Home Equipment: None      Prior Function Prior Level of  Function : Independent/Modified Independent;Working/employed             Mobility Comments: no AD use ADLs Comments: does IADLs, works at [employer]        Extremity/Trunk Assessment   Upper Extremity Assessment Upper Extremity Assessment: Defer to OT evaluation    Lower Extremity Assessment Lower Extremity Assessment: RLE deficits/detail RLE Deficits / Details: Grossly 3/5 at R hip/knee RLE: Unable to fully assess due to immobilization    Cervical / Trunk Assessment Cervical / Trunk Assessment: Normal  Communication   Communication: No difficulties  Cognition Arousal/Alertness: Awake/alert Behavior During Therapy: WFL for tasks assessed/performed Overall Cognitive Status: Within Functional Limits for tasks assessed                                 General Comments: Pt is up and heading to bathroom with OT when PT arrives.  OT and pt agreeable to let PT take over for eval after pt practiced 1 STS from commode.  Pt is alert and oriented x 3.        General Comments General comments (skin integrity, edema, etc.): VSS on RA    Exercises     Assessment/Plan    PT Assessment Patient needs continued PT services;All further PT needs can be met in the next venue of care  PT Problem List Decreased strength;Decreased mobility;Decreased range of motion;Decreased knowledge of precautions;Decreased knowledge of use of DME;Decreased balance;Decreased activity tolerance       PT Treatment Interventions DME instruction;Gait training;Balance training;Therapeutic exercise;Functional mobility training;Therapeutic activities;Patient/family education    PT Goals (Current goals can be found in the Care Plan section)  Acute Rehab PT Goals Patient Stated Goal: Pt's goal is to return to work PT Goal Formulation: With patient Time For Goal Achievement: 02/02/22 Potential to Achieve Goals: Good    Frequency Min 5X/week     Co-evaluation                AM-PAC PT "6 Clicks" Mobility  Outcome Measure Help needed turning from your back to your side while in a flat bed without using bedrails?: None Help needed moving from lying on your back to sitting on the side of a flat bed without using bedrails?: None Help needed moving to and from a bed to a chair (including a wheelchair)?: A Little Help needed standing up from a chair using your arms (e.g., wheelchair or bedside chair)?: A Little Help needed to walk in hospital room?: A Little Help needed climbing 3-5 steps with a railing? : A Lot 6 Click Score: 19    End of Session Equipment Utilized During Treatment: Gait belt Activity Tolerance: Patient tolerated treatment well Patient left: in chair;with call bell/phone within reach;with chair alarm set   PT Visit Diagnosis: Other abnormalities of gait and mobility (R26.89);History of falling (Z91.81)    Time: 5625-6389 PT Time Calculation (min) (ACUTE ONLY): 17 min   Charges:   PT Evaluation $PT Eval Low Complexity: 1 Low  Tiffany A. Roughgarden, PT, DPT Acute Rehabilitation Services Office: [place] 01/19/2022, 9:47 AM

## 2022-01-20 ENCOUNTER — Encounter (HOSPITAL_COMMUNITY): Payer: Self-pay | Admitting: Orthopaedic Surgery

## 2022-01-21 ENCOUNTER — Other Ambulatory Visit: Payer: Self-pay | Admitting: Family Medicine

## 2022-01-21 ENCOUNTER — Telehealth: Payer: Self-pay | Admitting: Family Medicine

## 2022-01-21 ENCOUNTER — Telehealth: Payer: Self-pay

## 2022-01-21 DIAGNOSIS — S82831A Other fracture of upper and lower end of right fibula, initial encounter for closed fracture: Secondary | ICD-10-CM | POA: Diagnosis not present

## 2022-01-21 NOTE — Telephone Encounter (Signed)
Transition Care Management Follow-up Telephone Call Date of discharge and from where: 01/19/2022-[facility] How have you been since you were released from the hospital? Doing well. Any questions or concerns? Yes-Pharmacy is waiting for approval from PCP forOxycodone, Patient already taking Hydrocodone.   Items Reviewed: Did the pt receive and understand the discharge instructions provided? Yes  Medications obtained and verified? Yes except dor Oxycodone Other? Yes  Any new allergies since your discharge? No  Dietary orders reviewed? Yes Do you have support at home? Yes   Home Care and Equipment/Supplies: Were home health services ordered? yes If so, what is the name of the agency? [facility]  Has the agency set up a time to come to the patient's home? yes Were any new equipment or medical supplies ordered?  No What is the name of the medical supply agency? N/a Were you able to get the supplies/equipment? not applicable Do you have any questions related to the use of the equipment or supplies? N/a  Functional Questionnaire: (I = Independent and D = Dependent) ADLs: I with assistance  Bathing/Dressing- I with assistance  Meal Prep- I with assistance  Eating- I  Maintaining continence- I  Transferring/Ambulation- I with walker  Managing Meds- I  Follow up appointments reviewed:  PCP Hospital f/u appt confirmed? Yes  Scheduled to see Dr. Patsy Lager on 01/24/2022 @ 11:40. Specialist Hospital f/u appt confirmed? Yes  Scheduled to see Dr. Susa Simmonds on 02/02/2021 @ 11:00. Are transportation arrangements needed? No  If their condition worsens, is the pt aware to call PCP or go to the Emergency Dept.? Yes Was the patient provided with contact information for the PCP's office or ED? Yes Was to pt encouraged to call back with questions or concerns? Yes

## 2022-01-21 NOTE — Telephone Encounter (Signed)
Pt cld to request refill:

## 2022-01-22 NOTE — Progress Notes (Signed)
[facility] [address], [place], [place] [address] [phone] [id number]  Date:  01/24/2022   Name:  Jonathan Harrington   DOB:  [birth year]   MRN:  [id number]  PCP:  Darreld Mclean, MD    Chief Complaint: Hospitalization Follow-up (01/17/2022- right tib and fib- started Lovenox and Oxy.)   History of Present Illness:  Jonathan Harrington is a 66 y.o. very pleasant male patient who presents with the following:  Patient seen virtually today for follow-up from recent hospital stay Connected with patient via telephone.  Patient location is his girlfriend's home in [facility], my location is office.  Patient and confirmed with 2 factors, he gives consent for virtual visit today.  The patient myself are present on the call  He is staying with his GF right now- he is not able to drive due to right leg fracture  He has a tall CAM on- and is using crutches, touch down weight bearing  He is seeing his surgeon on 2/22 He is allowed to remove the boot to sleep now   His ortho is giving him oxycodone 5 mg for pain control right now; he is using this instead of his usual hydrocodone He is using lovenox shots and PT is coming out to help mobilize him - they are doing ok giving Lovenox at home   He is still taking the lokalema - advised he can stop this, would like to recheck his K in about 2 weeks t see how he responds.  It is difficult for him to get out for labs- he will see if his home health people can draw this for him   Admit date:     01/17/2022  Discharge date: 01/19/22  Discharge Physician: Geradine Girt    PCP: Darreld Mclean, MD  Recommendations at discharge:   touchdown weightbearing in tall walking boot with use of crutches or walker.  He will follow-up in our office at [facility] with Dr. Lucia Gaskins in 2 weeks time.  We will plan on Lovenox for DVT prophylaxis.  Home health   Discharge Diagnoses: Principal  Problem:   Closed fracture of right tibia and fibula Active Problems:   Anxiety   Essential hypertension   COPD (chronic obstructive pulmonary disease) (HCC)   Alcohol use   Chronic pain   Hyperlipidemia   Tobacco use   Hospital Course: Jonathan Harrington is a 67 y.o. male with medical history significant for COPD, HTN, HLD, gout, anxiety, chronic pain, diverticulitis s/p partial colectomy, alcohol and tobacco use to the hospital after a fall in sustaining right ankle pain.  Patient does have a history of alcohol use almost daily which could have contributed to fall.  Patient was then admitted to hospital  since he was noted to have right displaced tibia and proximal fibular fractures after mechanical fall.  Orthopedics has plans for surgical fixation at [facility] on 01/18/2022.   Assessment and Plan: * Closed fracture of right tibia and fibula- (present on admission) Status post mechanical fall.  Orthopedics  surgical fixation at [facility] on 2/7.   Tobacco use- (present on admission) History of cigarette smoking 2 packs/day.  Continue nicotine patch. Hyperlipidemia- (present on admission) On Crestor at home.  Continue for now. Chronic pain- (present on admission) Continue Norco, Neurontin from home.  On as needed hydromorphone for severe pain. Alcohol use- (present on admission) Currently denies any withdrawal  symptoms but daily alcohol intake.  Last drink at 2/523.  Continue CIWA protocol. COPD (chronic obstructive pulmonary disease) ([place])- (present on admission) Appears to be stable at this time.  Continue with nebulizers.  Continue nicotine patch for Essential hypertension- (present on admission) Continue amlodipine lisinopril from home.  Blood pressure seems to be stable. Anxiety- (present on admission) Continue Xanax.   Margart Sickles notes he needs a refill of his alprazolam He plans to stop diphenhydramine for sleep and change to melatonin on the advice of [place] He states he  is drinking minimal alcohol and does not think alcohol contributed to his recent fall Patient Active Problem List   Diagnosis Date Noted   Closed fracture of right tibia and fibula 01/17/2022   Tobacco use 01/17/2022   Alcohol use    Chronic pain    Hyperlipidemia    Cough 12/20/2020   COPD (chronic obstructive pulmonary disease) ([place]) 12/20/2020   Neuropathy 06/04/2019   Alcoholism ([place]) 11/19/2018   Essential hypertension 06/06/2016   Smoker 05/12/2016   Left inguinal hernia 05/23/2014   Anxiety 09/17/2013   Gout 01/17/2013   DIVERTICULOSIS-COLON 08/13/2010   DIVERTICULITIS, COLON 08/13/2010   ABDOMINAL PAIN-LLQ 08/13/2010   Nonspecific (abnormal) findings on radiological and other examination of body structure 08/13/2010   PERSONAL HX COLONIC POLYPS 08/13/2010   NONSPCIFC ABN FINDING RAD & OTH EXAM LUNG FIELD 08/13/2010    Past Medical History:  Diagnosis Date   Alcoholism ([place])    Allergy    Anxiety    Arthritis    COPD (chronic obstructive pulmonary disease) ([place])    Diverticulitis 2011   resulted in partial colectomy   Dyspnea    Essential hypertension 06/06/2016   Full dentures    Gout    Hematuria    History of kidney stones    Wears glasses     Past Surgical History:  Procedure Laterality Date   ABDOMINAL AORTOGRAM W/LOWER EXTREMITY Bilateral 07/19/2019   Procedure: ABDOMINAL AORTOGRAM W/LOWER EXTREMITY;  Surgeon: Elam Dutch, MD;  Location: [facility];  Service: Cardiovascular;  Laterality: Bilateral;   COLON SURGERY  2011   partial colectomy   COLONOSCOPY     FINGER ARTHROPLASTY  1990   rt index fx   HERNIA REPAIR     INGUINAL HERNIA REPAIR Left 05/27/2014   Procedure: LEFT INGUINAL HERNIA REPAIR WITH MESH;  Surgeon: Imogene Burn. Georgette Dover, MD;  Location: [facility];  Service: General;  Laterality: Left;   INSERTION OF MESH Left 05/27/2014   Procedure: INSERTION OF MESH;  Surgeon: Imogene Burn. Georgette Dover, MD;  Location: [facility];  Service: General;  Laterality: Left;   TIBIA IM NAIL INSERTION Right 01/18/2022   Procedure: INTRAMEDULLARY (IM) NAIL TIBIAL;  Surgeon: Erle Crocker, MD;  Location: [facility];  Service: Orthopedics;  Laterality: Right;    Social History   Tobacco Use   Smoking status: Every Day    Packs/day: 2.00    Types: Cigarettes   Smokeless tobacco: Never  Vaping Use   Vaping Use: Never used  Substance Use Topics   Alcohol use: Yes    Alcohol/week: 21.0 standard drinks    Types: 21 Cans of beer per week    Comment: about 3 beers daily- history of heavy liquor abuse   Drug use: No    Family History  Problem Relation Age of Onset   Alzheimer's disease Mother    Cancer Sister    Neuropathy Neg Hx  Allergies  Allergen Reactions   Ibuprofen Swelling and Other (See Comments)    Hands and face swell- had to seek medical treatment.  No issue with celecoxib    Medication list has been reviewed and updated.  Current Outpatient Medications on File Prior to Visit  Medication Sig Dispense Refill   albuterol (VENTOLIN HFA) 108 (90 Base) MCG/ACT inhaler Inhale 2 puffs into the lungs every 6 (six) hours as needed for wheezing or shortness of breath. 1 Inhaler 6   allopurinol (ZYLOPRIM) 300 MG tablet Take 1 tablet (300 mg total) by mouth daily. 90 tablet 3   amLODipine (NORVASC) 5 MG tablet Take 1 tablet (5 mg total) by mouth daily. 90 tablet 3   ascorbic Acid (VITAMIN C) 500 MG CPCR Take 500 mg by mouth daily.     Cholecalciferol (VITAMIN D3) 50 MCG (2000 UT) TABS Take 2,000 Units by mouth daily.     diphenhydrAMINE HCl, Sleep, 50 MG CAPS Take 50 mg by mouth daily with supper.     enoxaparin (LOVENOX) 40 MG/0.4ML injection Inject 0.4 mLs (40 mg total) into the skin daily for 14 days. 5.6 mL 0   gabapentin (NEURONTIN) 300 MG capsule Take 3 capsules (900 mg total) by mouth 3 (three) times daily. 90 capsule 1   HYDROcodone-acetaminophen (NORCO) 7.5-325 MG tablet Take 1 tablet by mouth  every 8 (eight) hours as needed for moderate pain. (Patient taking differently: Take 1 tablet by mouth See admin instructions. Take 1 tablet by mouth in the morning and at bedtime- may take an additional 1 tablet one to two times a day as needed for pain) 70 tablet 0   lisinopril (ZESTRIL) 40 MG tablet Take 1 tablet (40 mg total) by mouth daily. 90 tablet 3   oxyCODONE (ROXICODONE) 5 MG immediate release tablet Take 1 tablet (5 mg total) by mouth every 6 (six) hours as needed for up to 5 days for severe pain or breakthrough pain. 20 tablet 0   rosuvastatin (CRESTOR) 20 MG tablet Take 1 tablet (20 mg total) by mouth daily. (Patient taking differently: Take 20 mg by mouth at bedtime.) 90 tablet 3   sildenafil (VIAGRA) 100 MG tablet Take 100 mg by mouth daily as needed for erectile dysfunction.     Testosterone 1.62 % GEL Place 1 Pump onto the skin in the morning.     triamcinolone cream (KENALOG) 0.1 % Apply 1 application topically 2 (two) times daily as needed (eczema on leg). (Patient taking differently: Apply 1 application topically 2 (two) times daily as needed (eczema on legs and ankles).) 90 g 2   vitamin B-12 (CYANOCOBALAMIN) 1000 MCG tablet Take 1,000-2,000 mcg by mouth daily.     No current facility-administered medications on file prior to visit.    Review of Systems:  As per HPI- otherwise negative.   Physical Examination: There were no vitals filed for this visit. There were no vitals filed for this visit. There is no height or weight on file to calculate BMI. Ideal Body Weight:    Spoke with pt on the phone today He sounds well-his normal self   Assessment and Plan: Closed fracture of right ankle with routine healing, subsequent encounter  GAD (generalized anxiety disorder) - Plan: ALPRAZolam (XANAX) 0.25 MG tablet  Grief - Plan: ALPRAZolam (XANAX) 0.25 MG tablet  Hyperkalemia  Telephone visit today for follow-up after admission for right ankle fracture status post  operative repair  He plans to stop using benadryl for sleep- will try  melatonin instead  His pain is being treated currently by orthopedics-once acute pain is passed we can return to chronic pain regimen Continue to encourage him to decrease or stop alcohol Refill alprazolam History of elevated potassium, most recent potassium levels from the hospital were normal.  I have asked him to stop potassium lowering medication and have his K checked in about 2 weeks Spoke with pt for 14 minutes today  Signed Lamar Blinks, MD

## 2022-01-24 ENCOUNTER — Telehealth (INDEPENDENT_AMBULATORY_CARE_PROVIDER_SITE_OTHER): Payer: Medicare PPO | Admitting: Family Medicine

## 2022-01-24 DIAGNOSIS — F4321 Adjustment disorder with depressed mood: Secondary | ICD-10-CM | POA: Diagnosis not present

## 2022-01-24 DIAGNOSIS — F411 Generalized anxiety disorder: Secondary | ICD-10-CM | POA: Diagnosis not present

## 2022-01-24 DIAGNOSIS — E875 Hyperkalemia: Secondary | ICD-10-CM

## 2022-01-24 DIAGNOSIS — S82891D Other fracture of right lower leg, subsequent encounter for closed fracture with routine healing: Secondary | ICD-10-CM

## 2022-01-24 MED ORDER — ALPRAZOLAM 0.25 MG PO TABS: 0.2500 mg | ORAL_TABLET | ORAL | 0 refills | Status: DC

## 2022-01-26 ENCOUNTER — Telehealth: Payer: Self-pay | Admitting: Family Medicine

## 2022-01-26 NOTE — Telephone Encounter (Signed)
Pt was seen via [facility] on 2/13- he has an Ortho provider. Please advise.

## 2022-01-26 NOTE — Telephone Encounter (Signed)
Pt would like to know if [facility] would like for him to come into the office.   Please advise.

## 2022-01-27 NOTE — Telephone Encounter (Signed)
Pt was speaking of the Potassium lab visit. He is aware that he needs to come on Monday 02/07/21 for the lab. He will call back to schedule.

## 2022-01-31 ENCOUNTER — Telehealth: Payer: Self-pay | Admitting: Family Medicine

## 2022-01-31 DIAGNOSIS — G8929 Other chronic pain: Secondary | ICD-10-CM

## 2022-01-31 MED ORDER — HYDROCODONE-ACETAMINOPHEN 7.5-325 MG PO TABS: 1.0000 | ORAL_TABLET | Freq: Three times a day (TID) | ORAL | 0 refills | Status: DC | PRN

## 2022-01-31 NOTE — Telephone Encounter (Signed)
Pt returned phone call, pt stated he no longer takes Oxycodone but does take Hydrocodone which is why is needing a med refill. Pt stated Ortho no longer manages his RX.  Please advise.

## 2022-01-31 NOTE — Telephone Encounter (Signed)
Are you okay with filling this? 

## 2022-01-31 NOTE — Telephone Encounter (Signed)
Per las OV:  "His ortho is giving him oxycodone 5 mg for pain control right now; he is using this instead of his usual hydrocodone He is using lovenox shots and PT is coming out to help mobilize him - they are doing ok giving Lovenox at home."

## 2022-01-31 NOTE — Telephone Encounter (Signed)
Called to see if he was taking Oxycodone or Hydrocodone and to see if Ortho was still managing the Rx. Left vm to return call.

## 2022-01-31 NOTE — Telephone Encounter (Signed)
Medication:  HYDROcodone-acetaminophen (NORCO) 7.5-325 MG tablet [id number]     Has the patient contacted their pharmacy? No. (If no, request that the patient contact the pharmacy for the refill.) (If yes, when and what did the pharmacy advise?)     Preferred Pharmacy (with phone number or street name):  [facility] [place] - [address]  [place], [place] [address]  Phone:  [phone]  Fax:  [phone]     Agent: Please be advised that RX refills may take up to 3 business days. We ask that you follow-up with your pharmacy.

## 2022-02-02 DIAGNOSIS — Z9889 Other specified postprocedural states: Secondary | ICD-10-CM | POA: Diagnosis not present

## 2022-02-02 DIAGNOSIS — S82201A Unspecified fracture of shaft of right tibia, initial encounter for closed fracture: Secondary | ICD-10-CM | POA: Diagnosis not present

## 2022-02-03 ENCOUNTER — Telehealth: Payer: Self-pay

## 2022-02-03 ENCOUNTER — Encounter: Payer: Self-pay | Admitting: Family Medicine

## 2022-02-03 NOTE — Telephone Encounter (Signed)
I have not called him, have you?

## 2022-02-03 NOTE — Telephone Encounter (Signed)
Pt states he is returning call from our office, nothing documented that he was called today by [place].   Telephone: [phone]

## 2022-02-08 ENCOUNTER — Telehealth: Payer: Self-pay | Admitting: *Deleted

## 2022-02-08 ENCOUNTER — Other Ambulatory Visit: Payer: Self-pay | Admitting: Family Medicine

## 2022-02-08 ENCOUNTER — Ambulatory Visit: Payer: Medicare PPO | Admitting: Family Medicine

## 2022-02-08 ENCOUNTER — Other Ambulatory Visit (INDEPENDENT_AMBULATORY_CARE_PROVIDER_SITE_OTHER): Payer: Medicare PPO

## 2022-02-08 ENCOUNTER — Encounter: Payer: Self-pay | Admitting: Family Medicine

## 2022-02-08 VITALS — BP 110/60 | Ht 71.0 in | Wt 174.0 lb

## 2022-02-08 DIAGNOSIS — I10 Essential (primary) hypertension: Secondary | ICD-10-CM

## 2022-02-08 DIAGNOSIS — E875 Hyperkalemia: Secondary | ICD-10-CM

## 2022-02-08 DIAGNOSIS — G629 Polyneuropathy, unspecified: Secondary | ICD-10-CM

## 2022-02-08 LAB — BASIC METABOLIC PANEL
BUN: 17 mg/dL (ref 6–23)
CO2: 28 mEq/L (ref 19–32)
Calcium: 10.3 mg/dL (ref 8.4–10.5)
Chloride: 98 mEq/L (ref 96–112)
Creatinine, Ser: 1.2 mg/dL (ref 0.40–1.50)
GFR: 62.4 mL/min (ref >60.00)
Glucose, Bld: 99 mg/dL (ref 70–99)
Potassium: 5.5 mEq/L — ABNORMAL HIGH (ref 3.5–5.1)
Sodium: 136 mEq/L (ref 135–145)

## 2022-02-08 MED ORDER — LISINOPRIL-HYDROCHLOROTHIAZIDE 20-12.5 MG PO TABS: 1.0000 | ORAL_TABLET | Freq: Every day | ORAL | 3 refills | Status: DC

## 2022-02-08 NOTE — Progress Notes (Signed)
°  Jonathan Harrington - 66 y.o. male MRN [id number]  Date of birth: [birth year]  SUBJECTIVE:  Including CC & ROS.  No chief complaint on file.   Jonathan Harrington is a 66 y.o. male that is following up for his neuropathy.  He had a recent fall and broke his right leg.  He is still recovering from that.  He does get relief with the gabapentin.   Review of Systems See HPI   HISTORY: Past Medical, Surgical, Social, and Family History Reviewed & Updated per EMR.   Pertinent Historical Findings include:  Past Medical History:  Diagnosis Date   Alcoholism ([place])    Allergy    Anxiety    Arthritis    COPD (chronic obstructive pulmonary disease) ([place])    Diverticulitis 2011   resulted in partial colectomy   Dyspnea    Essential hypertension 06/06/2016   Full dentures    Gout    Hematuria    History of kidney stones    Wears glasses     Past Surgical History:  Procedure Laterality Date   ABDOMINAL AORTOGRAM W/LOWER EXTREMITY Bilateral 07/19/2019   Procedure: ABDOMINAL AORTOGRAM W/LOWER EXTREMITY;  Surgeon: Elam Dutch, MD;  Location: [facility];  Service: Cardiovascular;  Laterality: Bilateral;   COLON SURGERY  2011   partial colectomy   COLONOSCOPY     FINGER ARTHROPLASTY  1990   rt index fx   HERNIA REPAIR     INGUINAL HERNIA REPAIR Left 05/27/2014   Procedure: LEFT INGUINAL HERNIA REPAIR WITH MESH;  Surgeon: Imogene Burn. Georgette Dover, MD;  Location: [facility];  Service: General;  Laterality: Left;   INSERTION OF MESH Left 05/27/2014   Procedure: INSERTION OF MESH;  Surgeon: Imogene Burn. Georgette Dover, MD;  Location: [facility];  Service: General;  Laterality: Left;   TIBIA IM NAIL INSERTION Right 01/18/2022   Procedure: INTRAMEDULLARY (IM) NAIL TIBIAL;  Surgeon: Erle Crocker, MD;  Location: [facility];  Service: Orthopedics;  Laterality: Right;     PHYSICAL EXAM:  VS: BP 110/60 (BP Location: Left Arm, Patient Position: Sitting)    Ht 5\' 11"  (1.803 m)    Wt 174  lb (78.9 kg)    BMI 24.27 kg/m  Physical Exam Gen: NAD, alert, cooperative with exam, well-appearing MSK:  Neurovascularly intact       ASSESSMENT & PLAN:   Neuropathy Acute on chronic in nature.  Has gotten improvement with gabapentin.  Unfortunately had a fall and broke his leg is still recovering. -Counseled on home exercise therapy and supportive care. -His gabapentin has been refilled. -May need consider nerve study.

## 2022-02-08 NOTE — Assessment & Plan Note (Signed)
Acute on chronic in nature.  Has gotten improvement with gabapentin.  Unfortunately had a fall and broke his leg is still recovering. -Counseled on home exercise therapy and supportive care. -His gabapentin has been refilled. -May need consider nerve study.

## 2022-02-08 NOTE — Telephone Encounter (Signed)
Pt is here for lab visit now but I don't see any future lab order in Epic.  There is a 01/26/22 phone note that says pt is aware to come in on 2/27 for potassium.  Can you review and place appropriate order please?

## 2022-02-08 NOTE — Telephone Encounter (Signed)
Lab ordered and collected.

## 2022-02-08 NOTE — Patient Instructions (Signed)
Good to see you We may need to perform a nerve study. I haven't seen where one was done in the past few years.   Please send me a message in MyChart with any questions or updates.  Please see me back in 6 months.   --Dr. Jordan Likes

## 2022-02-10 ENCOUNTER — Other Ambulatory Visit: Payer: Self-pay | Admitting: Family Medicine

## 2022-02-10 NOTE — Telephone Encounter (Signed)
Pt cld states Pharmacy says only 90tab  Rx gvn on 2/10 for pt to take 3 pill 3xs daily he has run out of pills. ? ?Patient says his other Rx were previously :  ? ?gabapentin (NEURONTIN) 300 MG capsule [[id number]]  DISCONTINUED ?  Order Details ?Dose, Route, Frequency: As Directed  ?Dispense Quantity: 240 capsule Refills: 0   ?     ?Sig: TAKE 3 CAPSULES BY MOUTH THREE TIMES DAILY  ?Patient taking differently: Take 900 mg by mouth 3 (three) times daily.  ?--Forwarding request to med asst. ? ?--glh ?

## 2022-02-14 ENCOUNTER — Telehealth: Payer: Self-pay | Admitting: Family Medicine

## 2022-02-14 ENCOUNTER — Encounter: Payer: Self-pay | Admitting: Family Medicine

## 2022-02-14 NOTE — Telephone Encounter (Signed)
Pt states he broke his leg & would like the dosage of Hydrocodone increased for a month ? ?250 quantity for a month  ? ?Please advise.  ?

## 2022-02-15 MED ORDER — HYDROCODONE-ACETAMINOPHEN 7.5-325 MG PO TABS: 1.0000 | ORAL_TABLET | Freq: Four times a day (QID) | ORAL | 0 refills | Status: DC | PRN

## 2022-02-15 NOTE — Telephone Encounter (Signed)
I called him back-  he notes the hydrocodone works better than oxycodone so he has been using this instead.  However, he notes he needs it every 6 hours instead of every 8 ? ?I sent in a prescription for 1 month supply, hydrocodone every 6 hours #120.  I advised patient this is a temporary change, next month we will go back to his usual dosage.  He states understanding ? ?Meds ordered this encounter  ?Medications  ? HYDROcodone-acetaminophen (NORCO) 7.5-325 MG tablet  ?  Sig: Take 1 tablet by mouth every 6 (six) hours as needed for moderate pain.  ?  Dispense:  120 tablet  ?  Refill:  0  ? ?Also learned he did not read MyChart message regarding his potassium in February 28.  We went over these recommendations and he will schedule a potassium recheck ?

## 2022-02-17 ENCOUNTER — Telehealth: Payer: Self-pay | Admitting: Family Medicine

## 2022-02-17 NOTE — Telephone Encounter (Signed)
Ok- called and gave ok to refill today if ok with pharmacy  ?

## 2022-02-17 NOTE — Telephone Encounter (Signed)
Patient states he is needing refill. Pharm can refill tomorrow but patient is needing to pick up today ? ?[facility] [address] ? ?Please advise ? ? ?HYDROcodone-acetaminophen (NORCO) 7.5-325 MG tablet [[id number]]  ?

## 2022-02-17 NOTE — Telephone Encounter (Signed)
Refill needed

## 2022-02-23 ENCOUNTER — Telehealth: Payer: Self-pay | Admitting: Family Medicine

## 2022-02-23 NOTE — Telephone Encounter (Signed)
Patient called back states he is staying in [place]/[facility] for 3 months . ? ?Pt needs a 90dys / 13month supply of: Gabapentin (only 240 pills were listed on the 02/14/22 Rx order). ? ?-Forwarding request to med asst for review w/provider (pt takes 9 pills daily of 300 MG) ? ?Temporary pharmacy while in [place] is:   ?Store [phone] ?[facility] at ?[address] ?[place], [place] [address] ?Phone :   ?[phone] ? ?-glh ? ?

## 2022-02-23 NOTE — Telephone Encounter (Signed)
Medication: ALPRAZolam (XANAX) 0.25 MG tablet  ? ?Has the patient contacted their pharmacy? No. ? ? ?Preferred Pharmacy: [facility], [address] [place]  ?[address] [place], [place] [address]  ?Phone:  [phone]  Fax:  [phone] ? ?

## 2022-02-24 MED ORDER — GABAPENTIN 300 MG PO CAPS: 900.0000 mg | ORAL_CAPSULE | Freq: Three times a day (TID) | ORAL | 2 refills | Status: DC

## 2022-02-24 MED ORDER — ALPRAZOLAM 0.25 MG PO TABS: 0.2500 mg | ORAL_TABLET | ORAL | 0 refills | Status: DC

## 2022-02-24 NOTE — Telephone Encounter (Signed)
New rx sent to pharmacy below.  ?

## 2022-02-24 NOTE — Telephone Encounter (Signed)
Okay for refill?  

## 2022-02-24 NOTE — Addendum Note (Signed)
Addended by: Merrilyn Puma on: 02/24/2022 05:19 PM ? ? Modules accepted: Orders ? ?

## 2022-02-25 DIAGNOSIS — Z9889 Other specified postprocedural states: Secondary | ICD-10-CM | POA: Diagnosis not present

## 2022-02-25 DIAGNOSIS — S82201A Unspecified fracture of shaft of right tibia, initial encounter for closed fracture: Secondary | ICD-10-CM | POA: Diagnosis not present

## 2022-03-14 ENCOUNTER — Telehealth: Payer: Self-pay

## 2022-03-14 DIAGNOSIS — M545 Low back pain, unspecified: Secondary | ICD-10-CM

## 2022-03-14 MED ORDER — HYDROCODONE-ACETAMINOPHEN 7.5-325 MG PO TABS: 1.0000 | ORAL_TABLET | Freq: Three times a day (TID) | ORAL | 0 refills | Status: DC | PRN

## 2022-03-14 NOTE — Telephone Encounter (Signed)
Patient requesting a refill on hydrocodone. Per patient he "already talk to provider about this, is for his leg pain"  ?

## 2022-03-16 ENCOUNTER — Telehealth: Payer: Self-pay | Admitting: Family Medicine

## 2022-03-16 DIAGNOSIS — G8929 Other chronic pain: Secondary | ICD-10-CM

## 2022-03-16 NOTE — Telephone Encounter (Signed)
Pt states the lisinopril is making him dizzy since he started taking it two weeks ago. He would like advise on what to do but for now he states he will stop taking it until Dr. Lorelei Pont reaches out to him. Please advise. ?

## 2022-03-17 ENCOUNTER — Telehealth: Payer: Self-pay

## 2022-03-17 MED ORDER — HYDROCODONE-ACETAMINOPHEN 7.5-325 MG PO TABS: 1.0000 | ORAL_TABLET | Freq: Three times a day (TID) | ORAL | 0 refills | Status: DC | PRN

## 2022-03-17 NOTE — Telephone Encounter (Signed)
Pt says his BP was 122/55 yesterday and 109/55.  ? ?He also asks that his Norco is switched to the [facility] location since that is where he is staying right now.  ?

## 2022-03-17 NOTE — Telephone Encounter (Signed)
Caller Name Jewett Mcgann ?Caller Phone Number [phone] ?Patient Name Jonathan Harrington ?Patient DOB 15-Aug-1956 ?Call Type Message Only Information Provided ?Reason for Call Medication Question / Request ?Initial Comment Caller states called and spoke to his Dr this morning in which a prescription was suppose to be ?called in to another pharmacy. ?Additional Comment Please call the caller. ?Disp. Time Disposition Final User ?03/17/2022 12:44:24 PM General Information Provided Yes Macario Carls ?Call Closed By: Macario Carls ?Transaction Date/Time: 03/17/2022 12:41:48 PM (ET) ?

## 2022-03-17 NOTE — Telephone Encounter (Signed)
BP Readings from Last 3 Encounters:  ?02/08/22 110/60  ?01/19/22 (!) 143/72  ?11/29/21 140/60  ? ?Called and spoke with him- we changed from lisinopril 40 to lisinopril/hctz 20/12.5 due to hyperkalemia ?He thought switching back to lisinopril 40 might fix his low BP- advised this is still a strong antihypertensive ?Recommend taking a 1/2 tablet of lisinopril/hctz; if BP still lower than about 110/70 on this stop entirely for now- BP likely low due to recent leg fracture and inactivity ?As he gets his strength back suspect BP will go back up  ?

## 2022-03-21 ENCOUNTER — Other Ambulatory Visit: Payer: Self-pay

## 2022-03-21 DIAGNOSIS — I10 Essential (primary) hypertension: Secondary | ICD-10-CM

## 2022-03-21 MED ORDER — LISINOPRIL-HYDROCHLOROTHIAZIDE 20-12.5 MG PO TABS: 1.0000 | ORAL_TABLET | Freq: Every day | ORAL | 3 refills | Status: DC

## 2022-03-21 NOTE — Telephone Encounter (Signed)
Rx sent to the correct pharmacy  

## 2022-03-25 ENCOUNTER — Other Ambulatory Visit (INDEPENDENT_AMBULATORY_CARE_PROVIDER_SITE_OTHER): Payer: Medicare PPO

## 2022-03-25 DIAGNOSIS — S82201D Unspecified fracture of shaft of right tibia, subsequent encounter for closed fracture with routine healing: Secondary | ICD-10-CM | POA: Diagnosis not present

## 2022-03-25 DIAGNOSIS — E875 Hyperkalemia: Secondary | ICD-10-CM

## 2022-03-25 DIAGNOSIS — I10 Essential (primary) hypertension: Secondary | ICD-10-CM | POA: Diagnosis not present

## 2022-03-25 DIAGNOSIS — Z9889 Other specified postprocedural states: Secondary | ICD-10-CM | POA: Diagnosis not present

## 2022-03-25 LAB — BASIC METABOLIC PANEL
BUN: 29 mg/dL — ABNORMAL HIGH (ref 6–23)
CO2: 25 mEq/L (ref 19–32)
Calcium: 8.9 mg/dL (ref 8.4–10.5)
Chloride: 97 mEq/L (ref 96–112)
Creatinine, Ser: 2.47 mg/dL — ABNORMAL HIGH (ref 0.40–1.50)
GFR: 26.22 mL/min — ABNORMAL LOW (ref >60.00)
Glucose, Bld: 86 mg/dL (ref 70–99)
Potassium: 4.6 mEq/L (ref 3.5–5.1)
Sodium: 130 mEq/L — ABNORMAL LOW (ref 135–145)

## 2022-03-26 ENCOUNTER — Encounter: Payer: Self-pay | Admitting: Family Medicine

## 2022-03-26 NOTE — Telephone Encounter (Signed)
Called pt regarding labs ?He is taking 1/2 tablet lisinopril/hctz (10/6.25)and his BP is running borderline low still but not low enough to totally stop meds (120/70) ?He feels fine ?Advised we need to recheck labs this week to follow-up on his renal function this week. He states understanding and agreement  ?

## 2022-03-27 NOTE — Telephone Encounter (Signed)
Called patient again today to check on his blood pressure.  He did not check it today as it turns out. ?Asked him to completely stop lisinopril/HCTZ for the time being, we will check his labs this week.  He had plan to return to work this week, but it turns out he may have an infection from his recent ankle surgery.  He notes his orthopedist started antibiotics.  He may need increased amounts of pain medication for a longer period of time ? ?Reminded him to schedule lab work this week ?

## 2022-03-30 ENCOUNTER — Telehealth: Payer: Self-pay | Admitting: Family Medicine

## 2022-03-30 DIAGNOSIS — S82401A Unspecified fracture of shaft of right fibula, initial encounter for closed fracture: Secondary | ICD-10-CM

## 2022-03-30 DIAGNOSIS — M545 Low back pain, unspecified: Secondary | ICD-10-CM

## 2022-03-30 DIAGNOSIS — F4321 Adjustment disorder with depressed mood: Secondary | ICD-10-CM

## 2022-03-30 DIAGNOSIS — F411 Generalized anxiety disorder: Secondary | ICD-10-CM

## 2022-03-30 DIAGNOSIS — S82201A Unspecified fracture of shaft of right tibia, initial encounter for closed fracture: Secondary | ICD-10-CM

## 2022-03-30 MED ORDER — HYDROCODONE-ACETAMINOPHEN 7.5-325 MG PO TABS: 1.0000 | ORAL_TABLET | Freq: Three times a day (TID) | ORAL | 0 refills | Status: DC | PRN

## 2022-03-30 MED ORDER — ALPRAZOLAM 0.25 MG PO TABS: 0.2500 mg | ORAL_TABLET | ORAL | 0 refills | Status: DC

## 2022-03-30 NOTE — Telephone Encounter (Signed)
Okay for refills.

## 2022-03-30 NOTE — Telephone Encounter (Signed)
Done --glh ?

## 2022-03-30 NOTE — Telephone Encounter (Signed)
Medication:  ?1.HYDROcodone-acetaminophen (NORCO) 7.5-325 MG tablet ? ?2.ALPRAZolam (XANAX) 0.25 MG tablet ? ?Has the patient contacted their pharmacy? No. ? ?Preferred Pharmacy (with phone number or street name):  ?[id number] - [place], [place] - [address] AT [facility] [address]  ?[address] [place], [place] [address]  ?Phone:  [phone]  Fax:  [phone]  ? ?Agent: Please be advised that RX refills may take up to 3 business days. We ask that you follow-up with your pharmacy.  ?

## 2022-03-31 ENCOUNTER — Other Ambulatory Visit: Payer: Self-pay

## 2022-03-31 DIAGNOSIS — E875 Hyperkalemia: Secondary | ICD-10-CM

## 2022-03-31 MED ORDER — HYDROCODONE-ACETAMINOPHEN 7.5-325 MG PO TABS: 1.0000 | ORAL_TABLET | Freq: Four times a day (QID) | ORAL | 0 refills | Status: AC | PRN

## 2022-03-31 NOTE — Addendum Note (Signed)
Addended by: Abbe Amsterdam C on: 03/31/2022 03:25 PM ? ? Modules accepted: Orders ? ?

## 2022-03-31 NOTE — Telephone Encounter (Signed)
Pt states pharmacy won't fill rx because it is too soon. He stated he just spoke with them and they need a dr's approval.   ?

## 2022-03-31 NOTE — Telephone Encounter (Signed)
Pt called back and stated him and Dr.[name] had also talked about raising the dosage 10 mg. He is wondering if that can be done as well.  ?

## 2022-03-31 NOTE — Telephone Encounter (Signed)
Called patient.  I apologize that I do not recall offering to increase his hydrocodone to 10 mg.  I am not comfortable making this increase at this time ?Patient did suffer a recent tib-fib fracture but it has been several weeks since this occurred in early February  ?He now reports an apparent infection near the surgical site and is taking antibiotics, but he reports he is scheduled to see orthopedist for follow-up for 3-1/2 weeks - would infer not thought to be a serious infection ? ?Advised that I can increase his hydrocodone to 4 times daily-typically takes up to 3 times daily-for 10-days only.  Then we will return to his regular schedule, certainly he may consult his orthopedist if they feel more pain relief is warranted ? ?However, even with this dose increase he is still too early to pick up more medication.  Pharmacist reports he actually picked up his most recent prescription of #70 hydrocodone on 4/9-this is a 17.5-day supply ?Therefore, the earliest the pharmacy can provide a refill is the 25th of this month ?

## 2022-03-31 NOTE — Telephone Encounter (Signed)
Are you willing to give this okay? ?

## 2022-03-31 NOTE — Telephone Encounter (Signed)
Please advise on this as well:  ?

## 2022-03-31 NOTE — Telephone Encounter (Signed)
Pt missed call and would like to speak with Dr.[name] if she is available. Please advise.  ?

## 2022-04-01 ENCOUNTER — Encounter: Payer: Self-pay | Admitting: Family Medicine

## 2022-04-01 ENCOUNTER — Other Ambulatory Visit (INDEPENDENT_AMBULATORY_CARE_PROVIDER_SITE_OTHER): Payer: Medicare PPO

## 2022-04-01 DIAGNOSIS — F17210 Nicotine dependence, cigarettes, uncomplicated: Secondary | ICD-10-CM | POA: Diagnosis not present

## 2022-04-01 DIAGNOSIS — E875 Hyperkalemia: Secondary | ICD-10-CM

## 2022-04-01 DIAGNOSIS — Z716 Tobacco abuse counseling: Secondary | ICD-10-CM | POA: Diagnosis not present

## 2022-04-01 DIAGNOSIS — S82201D Unspecified fracture of shaft of right tibia, subsequent encounter for closed fracture with routine healing: Secondary | ICD-10-CM | POA: Diagnosis not present

## 2022-04-01 DIAGNOSIS — Z9889 Other specified postprocedural states: Secondary | ICD-10-CM | POA: Diagnosis not present

## 2022-04-01 DIAGNOSIS — E291 Testicular hypofunction: Secondary | ICD-10-CM | POA: Diagnosis not present

## 2022-04-01 LAB — BASIC METABOLIC PANEL
BUN: 20 mg/dL (ref 6–23)
CO2: 24 mEq/L (ref 19–32)
Calcium: 9.6 mg/dL (ref 8.4–10.5)
Chloride: 103 mEq/L (ref 96–112)
Creatinine, Ser: 1.28 mg/dL (ref 0.40–1.50)
GFR: 57.69 mL/min — ABNORMAL LOW (ref >60.00)
Glucose, Bld: 81 mg/dL (ref 70–99)
Potassium: 4.8 mEq/L (ref 3.5–5.1)
Sodium: 137 mEq/L (ref 135–145)

## 2022-04-14 ENCOUNTER — Telehealth: Payer: Self-pay | Admitting: Family Medicine

## 2022-04-14 DIAGNOSIS — M545 Low back pain, unspecified: Secondary | ICD-10-CM

## 2022-04-14 MED ORDER — HYDROCODONE-ACETAMINOPHEN 7.5-325 MG PO TABS: 1.0000 | ORAL_TABLET | Freq: Three times a day (TID) | ORAL | 0 refills | Status: DC | PRN

## 2022-04-14 NOTE — Telephone Encounter (Signed)
Last refill was 03/30/22.  ? ?Please advise.  ?

## 2022-04-14 NOTE — Telephone Encounter (Signed)
? ?  HYDROcodone-acetaminophen (NORCO) 7.5-325 MG tablet [[id number]]  ? ? ? ? ?Pharmacy in [place] ?

## 2022-05-05 ENCOUNTER — Other Ambulatory Visit: Payer: Self-pay | Admitting: Family Medicine

## 2022-05-05 DIAGNOSIS — F4321 Adjustment disorder with depressed mood: Secondary | ICD-10-CM

## 2022-05-05 DIAGNOSIS — F411 Generalized anxiety disorder: Secondary | ICD-10-CM

## 2022-05-05 MED ORDER — ALPRAZOLAM 0.25 MG PO TABS: 0.2500 mg | ORAL_TABLET | ORAL | 0 refills | Status: DC

## 2022-05-05 NOTE — Telephone Encounter (Signed)
Pt needs rx for xanx refilled  [facility] [address] [employer], [phone]

## 2022-05-06 DIAGNOSIS — S82201D Unspecified fracture of shaft of right tibia, subsequent encounter for closed fracture with routine healing: Secondary | ICD-10-CM | POA: Diagnosis not present

## 2022-05-16 ENCOUNTER — Telehealth: Payer: Self-pay

## 2022-05-16 DIAGNOSIS — M545 Low back pain, unspecified: Secondary | ICD-10-CM

## 2022-05-16 MED ORDER — HYDROCODONE-ACETAMINOPHEN 7.5-325 MG PO TABS: 1.0000 | ORAL_TABLET | Freq: Three times a day (TID) | ORAL | 0 refills | Status: DC | PRN

## 2022-05-16 NOTE — Telephone Encounter (Signed)
Initial Comment Caller states he is out of his medication. Translation No Nurse Assessment Nurse: Zenia Resides, RN, Diane Date/Time Eilene Ghazi Time): 05/14/2022 11:23:11 AM Confirm and document reason for call. If symptomatic, describe symptoms. ---Caller states he is out of his medication. He is out of Hydrocodone. Taking for arthritis. Has enough for today. No new or worsening symptoms. Does the patient have any new or worsening symptoms? ---No Disp. Time Eilene Ghazi Time) Disposition Final User 05/14/2022 11:06:55 AM Send To Nurse [name], RN, Ronalee Belts 05/14/2022 11:12:49 AM Attempt made - message left Zenia Resides, RN, Diane 05/14/2022 11:24:46 AM Clinical Call Yes Zenia Resides, RN, Diane

## 2022-05-16 NOTE — Telephone Encounter (Signed)
Medication: HYDROcodone-acetaminophen (NORCO) 7.5-325 MG tablet  Has the patient contacted their pharmacy? Yes.    Preferred Pharmacy: pt has moved and this is his new pharmacy.  [facility] [address], [place], [place] [address]

## 2022-05-16 NOTE — Telephone Encounter (Signed)
Okay for refill?  

## 2022-05-17 ENCOUNTER — Other Ambulatory Visit: Payer: Self-pay | Admitting: *Deleted

## 2022-05-17 DIAGNOSIS — I70213 Atherosclerosis of native arteries of extremities with intermittent claudication, bilateral legs: Secondary | ICD-10-CM

## 2022-05-17 MED ORDER — HYDROCODONE-ACETAMINOPHEN 7.5-325 MG PO TABS: 1.0000 | ORAL_TABLET | Freq: Three times a day (TID) | ORAL | 0 refills | Status: DC | PRN

## 2022-05-17 NOTE — Telephone Encounter (Signed)
Pt called again and stated he would like pharmacy updated and all rx's sent to the new pharmacy.  [facility] [address], [place], [place] [address]

## 2022-05-17 NOTE — Telephone Encounter (Signed)
Pt wanted rx sent to alternate pharmacy. Rx was sent to wrong pharmacy.   [facility] [address] [place], [place] [address]

## 2022-05-17 NOTE — Addendum Note (Signed)
Addended by: Lamar Blinks C on: 05/17/2022 12:00 PM   Modules accepted: Orders

## 2022-05-17 NOTE — Telephone Encounter (Signed)
Norco was sent to incorrect pharmacy.

## 2022-05-30 ENCOUNTER — Ambulatory Visit: Payer: Medicare PPO | Admitting: Surgery

## 2022-05-30 ENCOUNTER — Ambulatory Visit (HOSPITAL_COMMUNITY)
Admission: RE | Admit: 2022-05-30 | Discharge: 2022-05-30 | Disposition: A | Discharge status: Home / Self Care | Payer: Medicare PPO | Source: Ambulatory Visit | Attending: Surgery | Admitting: Surgery

## 2022-05-30 ENCOUNTER — Encounter: Payer: Self-pay | Admitting: Surgery

## 2022-05-30 VITALS — BP 149/70 | HR 84 | Temp 98.3°F | Resp 20 | Ht 71.0 in | Wt 159.0 lb

## 2022-05-30 DIAGNOSIS — I70213 Atherosclerosis of native arteries of extremities with intermittent claudication, bilateral legs: Secondary | ICD-10-CM

## 2022-05-30 DIAGNOSIS — I7025 Atherosclerosis of native arteries of other extremities with ulceration: Secondary | ICD-10-CM

## 2022-05-30 MED ORDER — SULFAMETHOXAZOLE-TRIMETHOPRIM 800-160 MG PO TABS: 1.0000 | ORAL_TABLET | Freq: Two times a day (BID) | ORAL | 0 refills | Status: DC

## 2022-05-30 NOTE — Progress Notes (Signed)
[facility] [place]  Patient name: Jonathan Harrington MRN: [id number] DOB: [birth year] Sex: male   REQUESTING PROVIDER:    Dr. Susa Simmonds   REASON FOR CONSULT:    Right leg ulcer  HISTORY OF PRESENT ILLNESS:   Jonathan Harrington is a 66 y.o. male, who was last seen by Dr. Arbie Cookey in 2021 for bilateral claudication.  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  He was felt to not be a good candidate for below-knee bypass secondary to claudication.  Therefore medical management was recommended.  He also complains of peripheral neuropathy.  He is a smoker.  He also has COPD.  He broke his leg in February and had hardware placed.  He was in a boot.  He has had a nonhealing wound since that time on his ankle and the top of his foot.  He is here today for vascular work-up.  PAST MEDICAL HISTORY    Past Medical History:  Diagnosis Date   Alcoholism [facility])    Allergy    Anxiety    Arthritis    COPD (chronic obstructive pulmonary disease) (HCC)    Diverticulitis 2011   resulted in partial colectomy   Dyspnea    Essential hypertension 06/06/2016   Full dentures    Gout    Hematuria    History of kidney stones    Wears glasses      FAMILY HISTORY   Family History  Problem Relation Age of Onset   Alzheimer's disease Mother    Cancer Sister    Neuropathy Neg Hx     SOCIAL HISTORY:   Social History   Socioeconomic History   Marital status: Widowed    Spouse name: died April 07, 2016   Number of children: 1   Years of education: Not on file   Highest education level: Not on file  Occupational History   Occupation: IT consultant  Tobacco Use   Smoking status: Every Day    Packs/day: 2.00    Types: Cigarettes   Smokeless tobacco: Never  Vaping Use   Vaping Use: Never used  Substance and Sexual Activity    Alcohol use: Yes    Alcohol/week: 21.0 standard drinks of alcohol    Types: 21 Cans of beer per week    Comment: about 3 beers daily- history of heavy liquor abuse   Drug use: No   Sexual activity: Not on file  Other Topics Concern   Not on file  Social History Narrative   Lives alone following the death of his wife 2016/04/07 following 42 years of marriage.   His son lives in the [place] area, and visits on weekends.   Social Determinants of Health   Financial Resource Strain: Not on file  Food Insecurity: Not on file  Transportation Needs: Not on file  Physical Activity: Not on file  Stress: Not on file  Social Connections: Not on file  Intimate Partner Violence: Not on file    ALLERGIES:    Allergies  Allergen Reactions   Ibuprofen Swelling and Other (See Comments)    Hands and face swell- had to seek medical treatment.  No issue with celecoxib    CURRENT MEDICATIONS:    Current Outpatient Medications  Medication Sig Dispense Refill   allopurinol (ZYLOPRIM) 300 MG tablet Take 1 tablet (300 mg total) by mouth daily. 90 tablet 3   ALPRAZolam (XANAX) 0.25 MG  tablet Take 1 tablet (0.25 mg total) by mouth See admin instructions. Take 0.25 mg by mouth in the morning and an additional 0.25 mg during the day or in the evening as needed for anxiety 45 tablet 0   amLODipine (NORVASC) 5 MG tablet Take 1 tablet (5 mg total) by mouth daily. 90 tablet 3   ascorbic Acid (VITAMIN C) 500 MG CPCR Take 500 mg by mouth daily.     Cholecalciferol (VITAMIN D3) 50 MCG (2000 UT) TABS Take 2,000 Units by mouth daily.     diphenhydrAMINE HCl, Sleep, 50 MG CAPS Take 50 mg by mouth daily with supper.     gabapentin (NEURONTIN) 300 MG capsule Take 3 capsules (900 mg total) by mouth 3 (three) times daily. 270 capsule 2   HYDROcodone-acetaminophen (NORCO) 7.5-325 MG tablet Take 1 tablet by mouth every 8 (eight) hours as needed for moderate pain. This is a 30 day supply- must last for 30 days 70  tablet 0   rosuvastatin (CRESTOR) 20 MG tablet Take 1 tablet (20 mg total) by mouth daily. (Patient taking differently: Take 20 mg by mouth at bedtime.) 90 tablet 3   sildenafil (VIAGRA) 100 MG tablet Take 100 mg by mouth daily as needed for erectile dysfunction.     Testosterone 1.62 % GEL Place 1 Pump onto the skin in the morning.     triamcinolone cream (KENALOG) 0.1 % Apply 1 application topically 2 (two) times daily as needed (eczema on leg). (Patient taking differently: Apply 1 application  topically 2 (two) times daily as needed (eczema on legs and ankles).) 90 g 2   vitamin B-12 (CYANOCOBALAMIN) 1000 MCG tablet Take 1,000-2,000 mcg by mouth daily.     No current facility-administered medications for this visit.    REVIEW OF SYSTEMS:   [X]  denotes positive finding, [ ]  denotes negative finding Cardiac  Comments:  Chest pain or chest pressure:    Shortness of breath upon exertion:    Short of breath when lying flat:    Irregular heart rhythm:        Vascular    Pain in calf, thigh, or hip brought on by ambulation:    Pain in feet at night that wakes you up from your sleep:     Blood clot in your veins:    Leg swelling:         Pulmonary    Oxygen at home:    Productive cough:     Wheezing:         Neurologic    Sudden weakness in arms or legs:     Sudden numbness in arms or legs:     Sudden onset of difficulty speaking or slurred speech:    Temporary loss of vision in one eye:     Problems with dizziness:         Gastrointestinal    Blood in stool:      Vomited blood:         Genitourinary    Burning when urinating:     Blood in urine:        Psychiatric    Major depression:         Hematologic    Bleeding problems:    Problems with blood clotting too easily:        Skin    Rashes or ulcers: x       Constitutional    Fever or chills:     PHYSICAL EXAM:   Vitals:  05/30/22 1141  BP: (!) 149/70  Pulse: 84  Resp: 20  Temp: 98.3 F (36.8 C)   SpO2: 96%  Weight: 159 lb (72.1 kg)  Height: 5\' 11"  (1.803 m)    GENERAL: The patient is a well-nourished male, in no acute distress. The vital signs are documented above. CARDIAC: There is a regular rate and rhythm.  VASCULAR: Nonpalpable pedal pulses PULMONARY: Nonlabored respirations MUSCULOSKELETAL: There are no major deformities or cyanosis. NEUROLOGIC: No focal weakness or paresthesias are detected. SKIN: See photo below PSYCHIATRIC: The patient has a normal affect.     STUDIES:   I have reviewed the following vascular studies:  +-------+-----------+-----------+------------+------------+  ABI/TBIToday's ABIToday's TBIPrevious ABIPrevious TBI  +-------+-----------+-----------+------------+------------+  Right  0.63       0.25       0.55        0.33          +-------+-----------+-----------+------------+------------+  Left   0.72       0.31       0.59        0.46          +-------+-----------+-----------+------------+------------+  Right toe pressure: 36 Left toe pressure: 44 Waveforms are monophasic  ASSESSMENT and PLAN   Right leg ulcer: This has been present for several months and has not improved.  He clearly has a arterial insufficiency component to his wound.  The neck step is to proceed with angiography.  He will most likely require bypass.  I have his angiogram scheduled for next Tuesday, June 27.  He will need to get vein mapping while he was there and then have his bypass scheduled.  I did gust that this is a limb threatening situation.  All questions were answered today.   10-15-2002, MD, FACS [facility] [phone] Pager [phone]

## 2022-05-30 NOTE — H&P (View-Only) (Signed)
[facility] [place]  Patient name: Jonathan Harrington MRN: [id number] DOB: [birth year] Sex: male   REQUESTING PROVIDER:    Dr. Susa Simmonds   REASON FOR CONSULT:    Right leg ulcer  HISTORY OF PRESENT ILLNESS:   Jonathan Harrington is a 66 y.o. male, who was last seen by Dr. Arbie Cookey in 2021 for bilateral claudication.  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  He was felt to not be a good candidate for below-knee bypass secondary to claudication.  Therefore medical management was recommended.  He also complains of peripheral neuropathy.  He is a smoker.  He also has COPD.  He broke his leg in February and had hardware placed.  He was in a boot.  He has had a nonhealing wound since that time on his ankle and the top of his foot.  He is here today for vascular work-up.  PAST MEDICAL HISTORY    Past Medical History:  Diagnosis Date   Alcoholism [facility])    Allergy    Anxiety    Arthritis    COPD (chronic obstructive pulmonary disease) (HCC)    Diverticulitis 2011   resulted in partial colectomy   Dyspnea    Essential hypertension 06/06/2016   Full dentures    Gout    Hematuria    History of kidney stones    Wears glasses      FAMILY HISTORY   Family History  Problem Relation Age of Onset   Alzheimer's disease Mother    Cancer Sister    Neuropathy Neg Hx     SOCIAL HISTORY:   Social History   Socioeconomic History   Marital status: Widowed    Spouse name: died April 07, 2016   Number of children: 1   Years of education: Not on file   Highest education level: Not on file  Occupational History   Occupation: IT consultant  Tobacco Use   Smoking status: Every Day    Packs/day: 2.00    Types: Cigarettes   Smokeless tobacco: Never  Vaping Use   Vaping Use: Never used  Substance and Sexual Activity    Alcohol use: Yes    Alcohol/week: 21.0 standard drinks of alcohol    Types: 21 Cans of beer per week    Comment: about 3 beers daily- history of heavy liquor abuse   Drug use: No   Sexual activity: Not on file  Other Topics Concern   Not on file  Social History Narrative   Lives alone following the death of his wife 2016/04/07 following 42 years of marriage.   His son lives in the [place] area, and visits on weekends.   Social Determinants of Health   Financial Resource Strain: Not on file  Food Insecurity: Not on file  Transportation Needs: Not on file  Physical Activity: Not on file  Stress: Not on file  Social Connections: Not on file  Intimate Partner Violence: Not on file    ALLERGIES:    Allergies  Allergen Reactions   Ibuprofen Swelling and Other (See Comments)    Hands and face swell- had to seek medical treatment.  No issue with celecoxib    CURRENT MEDICATIONS:    Current Outpatient Medications  Medication Sig Dispense Refill   allopurinol (ZYLOPRIM) 300 MG tablet Take 1 tablet (300 mg total) by mouth daily. 90 tablet 3   ALPRAZolam (XANAX) 0.25 MG  tablet Take 1 tablet (0.25 mg total) by mouth See admin instructions. Take 0.25 mg by mouth in the morning and an additional 0.25 mg during the day or in the evening as needed for anxiety 45 tablet 0   amLODipine (NORVASC) 5 MG tablet Take 1 tablet (5 mg total) by mouth daily. 90 tablet 3   ascorbic Acid (VITAMIN C) 500 MG CPCR Take 500 mg by mouth daily.     Cholecalciferol (VITAMIN D3) 50 MCG (2000 UT) TABS Take 2,000 Units by mouth daily.     diphenhydrAMINE HCl, Sleep, 50 MG CAPS Take 50 mg by mouth daily with supper.     gabapentin (NEURONTIN) 300 MG capsule Take 3 capsules (900 mg total) by mouth 3 (three) times daily. 270 capsule 2   HYDROcodone-acetaminophen (NORCO) 7.5-325 MG tablet Take 1 tablet by mouth every 8 (eight) hours as needed for moderate pain. This is a 30 day supply- must last for 30 days 70  tablet 0   rosuvastatin (CRESTOR) 20 MG tablet Take 1 tablet (20 mg total) by mouth daily. (Patient taking differently: Take 20 mg by mouth at bedtime.) 90 tablet 3   sildenafil (VIAGRA) 100 MG tablet Take 100 mg by mouth daily as needed for erectile dysfunction.     Testosterone 1.62 % GEL Place 1 Pump onto the skin in the morning.     triamcinolone cream (KENALOG) 0.1 % Apply 1 application topically 2 (two) times daily as needed (eczema on leg). (Patient taking differently: Apply 1 application  topically 2 (two) times daily as needed (eczema on legs and ankles).) 90 g 2   vitamin B-12 (CYANOCOBALAMIN) 1000 MCG tablet Take 1,000-2,000 mcg by mouth daily.     No current facility-administered medications for this visit.    REVIEW OF SYSTEMS:   [X]  denotes positive finding, [ ]  denotes negative finding Cardiac  Comments:  Chest pain or chest pressure:    Shortness of breath upon exertion:    Short of breath when lying flat:    Irregular heart rhythm:        Vascular    Pain in calf, thigh, or hip brought on by ambulation:    Pain in feet at night that wakes you up from your sleep:     Blood clot in your veins:    Leg swelling:         Pulmonary    Oxygen at home:    Productive cough:     Wheezing:         Neurologic    Sudden weakness in arms or legs:     Sudden numbness in arms or legs:     Sudden onset of difficulty speaking or slurred speech:    Temporary loss of vision in one eye:     Problems with dizziness:         Gastrointestinal    Blood in stool:      Vomited blood:         Genitourinary    Burning when urinating:     Blood in urine:        Psychiatric    Major depression:         Hematologic    Bleeding problems:    Problems with blood clotting too easily:        Skin    Rashes or ulcers: x       Constitutional    Fever or chills:     PHYSICAL EXAM:   Vitals:  05/30/22 1141  BP: (!) 149/70  Pulse: 84  Resp: 20  Temp: 98.3 F (36.8 C)   SpO2: 96%  Weight: 159 lb (72.1 kg)  Height: 5' 11" (1.803 m)    GENERAL: The patient is a well-nourished male, in no acute distress. The vital signs are documented above. CARDIAC: There is a regular rate and rhythm.  VASCULAR: Nonpalpable pedal pulses PULMONARY: Nonlabored respirations MUSCULOSKELETAL: There are no major deformities or cyanosis. NEUROLOGIC: No focal weakness or paresthesias are detected. SKIN: See photo below PSYCHIATRIC: The patient has a normal affect.     STUDIES:   I have reviewed the following vascular studies:  +-------+-----------+-----------+------------+------------+  ABI/TBIToday's ABIToday's TBIPrevious ABIPrevious TBI  +-------+-----------+-----------+------------+------------+  Right  0.63       0.25       0.55        0.33          +-------+-----------+-----------+------------+------------+  Left   0.72       0.31       0.59        0.46          +-------+-----------+-----------+------------+------------+  Right toe pressure: 36 Left toe pressure: 44 Waveforms are monophasic  ASSESSMENT and PLAN   Right leg ulcer: This has been present for several months and has not improved.  He clearly has a arterial insufficiency component to his wound.  The neck step is to proceed with angiography.  He will most likely require bypass.  I have his angiogram scheduled for next Tuesday, June 27.  He will need to get vein mapping while he was there and then have his bypass scheduled.  I did gust that this is a limb threatening situation.  All questions were answered today.   Wells Lashala Laser, IV, MD, FACS [facility] [place] Tel [phone] Pager [phone]  

## 2022-05-31 ENCOUNTER — Telehealth: Payer: Self-pay

## 2022-05-31 ENCOUNTER — Other Ambulatory Visit: Payer: Self-pay

## 2022-05-31 DIAGNOSIS — I7025 Atherosclerosis of native arteries of other extremities with ulceration: Secondary | ICD-10-CM

## 2022-05-31 NOTE — Telephone Encounter (Signed)
error 

## 2022-06-03 DIAGNOSIS — N138 Other obstructive and reflux uropathy: Secondary | ICD-10-CM | POA: Diagnosis not present

## 2022-06-03 DIAGNOSIS — N401 Enlarged prostate with lower urinary tract symptoms: Secondary | ICD-10-CM | POA: Diagnosis not present

## 2022-06-03 DIAGNOSIS — R3589 Other polyuria: Secondary | ICD-10-CM | POA: Diagnosis not present

## 2022-06-03 DIAGNOSIS — T387X5S Adverse effect of androgens and anabolic congeners, sequela: Secondary | ICD-10-CM | POA: Diagnosis not present

## 2022-06-03 DIAGNOSIS — E291 Testicular hypofunction: Secondary | ICD-10-CM | POA: Diagnosis not present

## 2022-06-03 LAB — COMPREHENSIVE METABOLIC PANEL: eGFR: 39

## 2022-06-03 LAB — TESTOSTERONE: Testosterone: 536

## 2022-06-03 LAB — CBC AND DIFFERENTIAL
HCT: 42 (ref 41–53)
Hemoglobin: 14.3 (ref 13.5–17.5)
Platelets: 220 10*3/uL (ref 150–400)
WBC: 6

## 2022-06-03 LAB — BASIC METABOLIC PANEL: Creatinine: 1.9 — AB (ref 0.6–1.3)

## 2022-06-03 LAB — PSA: PSA: 0.9

## 2022-06-06 ENCOUNTER — Telehealth: Payer: Self-pay

## 2022-06-06 NOTE — Telephone Encounter (Signed)
Patient contacted office requesting to move aortogram to another date. Patient rescheduled for provider's next available on July 11. Instructions reviewed and patient verbalized understanding.

## 2022-06-07 ENCOUNTER — Ambulatory Visit: Payer: Medicare PPO | Admitting: Family Medicine

## 2022-06-07 ENCOUNTER — Encounter: Payer: Self-pay | Admitting: Family Medicine

## 2022-06-07 VITALS — BP 120/62 | Ht 71.0 in | Wt 159.0 lb

## 2022-06-07 DIAGNOSIS — I739 Peripheral vascular disease, unspecified: Secondary | ICD-10-CM

## 2022-06-07 DIAGNOSIS — G629 Polyneuropathy, unspecified: Secondary | ICD-10-CM | POA: Diagnosis not present

## 2022-06-07 DIAGNOSIS — F1020 Alcohol dependence, uncomplicated: Secondary | ICD-10-CM | POA: Diagnosis not present

## 2022-06-07 DIAGNOSIS — Z720 Tobacco use: Secondary | ICD-10-CM | POA: Diagnosis not present

## 2022-06-07 DIAGNOSIS — D649 Anemia, unspecified: Secondary | ICD-10-CM | POA: Diagnosis not present

## 2022-06-07 DIAGNOSIS — J449 Chronic obstructive pulmonary disease, unspecified: Secondary | ICD-10-CM | POA: Diagnosis not present

## 2022-06-07 DIAGNOSIS — E785 Hyperlipidemia, unspecified: Secondary | ICD-10-CM | POA: Diagnosis not present

## 2022-06-07 DIAGNOSIS — M109 Gout, unspecified: Secondary | ICD-10-CM | POA: Diagnosis not present

## 2022-06-07 NOTE — Assessment & Plan Note (Addendum)
Acute on chronic in nature.  Was found to have 100% occlusion as well as nonhealing ulcers in the right lower extremity.  He has surgery scheduled for July.  Unclear if this type of ischemia is leading to his current spasms that he is experiencing. -Counseled on supportive care. -Path review, CMP, B12 and folate, iron and ferritin, magnesium, SPEP

## 2022-06-07 NOTE — Assessment & Plan Note (Signed)
Acutely occurring. -Path review smear

## 2022-06-09 LAB — PROTEIN ELECTROPHORESIS, SERUM
A/G Ratio: 1.1 (ref 0.7–1.7)
Albumin ELP: 3.8 g/dL (ref 2.9–4.4)
Alpha 1: 0.2 g/dL (ref 0.0–0.4)
Alpha 2: 0.8 g/dL (ref 0.4–1.0)
Beta: 1.1 g/dL (ref 0.7–1.3)
Gamma Globulin: 1.2 g/dL (ref 0.4–1.8)
Globulin, Total: 3.4 g/dL (ref 2.2–3.9)

## 2022-06-09 LAB — COMPREHENSIVE METABOLIC PANEL
ALT: 18 IU/L (ref 0–44)
AST: 28 IU/L (ref 0–40)
Albumin/Globulin Ratio: 1.6 (ref 1.2–2.2)
Albumin: 4.4 g/dL (ref 3.8–4.8)
Alkaline Phosphatase: 88 IU/L (ref 44–121)
BUN/Creatinine Ratio: 11 (ref 10–24)
BUN: 16 mg/dL (ref 8–27)
Bilirubin Total: 0.4 mg/dL (ref 0.0–1.2)
CO2: 20 mmol/L (ref 20–29)
Calcium: 9.5 mg/dL (ref 8.6–10.2)
Chloride: 103 mmol/L (ref 96–106)
Creatinine, Ser: 1.52 mg/dL — ABNORMAL HIGH (ref 0.76–1.27)
Globulin, Total: 2.8 g/dL (ref 1.5–4.5)
Glucose: 88 mg/dL (ref 70–99)
Potassium: 5.9 mmol/L — ABNORMAL HIGH (ref 3.5–5.2)
Sodium: 136 mmol/L (ref 134–144)
Total Protein: 7.2 g/dL (ref 6.0–8.5)
eGFR: 50 mL/min/{1.73_m2} — ABNORMAL LOW (ref >59)

## 2022-06-09 LAB — IRON,TIBC AND FERRITIN PANEL
Ferritin: 118 ng/mL (ref 30–400)
Iron Saturation: 24 % (ref 15–55)
Iron: 76 ug/dL (ref 38–169)
Total Iron Binding Capacity: 315 ug/dL (ref 250–450)
UIBC: 239 ug/dL (ref 111–343)

## 2022-06-09 LAB — B12 AND FOLATE PANEL
Folate: 3.9 ng/mL (ref >3.0)
Vitamin B-12: 786 pg/mL (ref 232–1245)

## 2022-06-09 LAB — MAGNESIUM: Magnesium: 1.9 mg/dL (ref 1.6–2.3)

## 2022-06-15 ENCOUNTER — Telehealth: Payer: Self-pay

## 2022-06-15 DIAGNOSIS — M545 Low back pain, unspecified: Secondary | ICD-10-CM

## 2022-06-15 MED ORDER — HYDROCODONE-ACETAMINOPHEN 7.5-325 MG PO TABS: 1.0000 | ORAL_TABLET | Freq: Three times a day (TID) | ORAL | 0 refills | Status: DC | PRN

## 2022-06-15 NOTE — Telephone Encounter (Signed)
Requesting: Norco 7.5-325 Contract: none UDS: 02/15/21 Last Visit: 11/29/21 Next Visit: not scheduled Last Refill: 05/17/22  Please Advise

## 2022-06-15 NOTE — Telephone Encounter (Signed)
Please send to [facility] [address] [employer] [address] [employer]

## 2022-06-15 NOTE — Telephone Encounter (Signed)
PDMP okay, Rx sent 

## 2022-06-21 ENCOUNTER — Other Ambulatory Visit: Payer: Self-pay

## 2022-06-21 ENCOUNTER — Ambulatory Visit (HOSPITAL_COMMUNITY)
Admission: RE | Admit: 2022-06-21 | Discharge: 2022-06-21 | Disposition: A | Discharge status: Home / Self Care | Payer: Medicare PPO | Attending: Surgery | Admitting: Surgery

## 2022-06-21 ENCOUNTER — Encounter (HOSPITAL_COMMUNITY)
Admission: RE | Disposition: A | Discharge status: Home / Self Care | Payer: Self-pay | Source: Home / Self Care | Attending: Surgery

## 2022-06-21 ENCOUNTER — Telehealth: Payer: Self-pay | Admitting: Family Medicine

## 2022-06-21 ENCOUNTER — Ambulatory Visit (HOSPITAL_BASED_OUTPATIENT_CLINIC_OR_DEPARTMENT_OTHER): Payer: Medicare PPO

## 2022-06-21 DIAGNOSIS — G629 Polyneuropathy, unspecified: Secondary | ICD-10-CM | POA: Insufficient documentation

## 2022-06-21 DIAGNOSIS — F17210 Nicotine dependence, cigarettes, uncomplicated: Secondary | ICD-10-CM | POA: Diagnosis not present

## 2022-06-21 DIAGNOSIS — J449 Chronic obstructive pulmonary disease, unspecified: Secondary | ICD-10-CM | POA: Insufficient documentation

## 2022-06-21 DIAGNOSIS — I70235 Atherosclerosis of native arteries of right leg with ulceration of other part of foot: Secondary | ICD-10-CM | POA: Insufficient documentation

## 2022-06-21 DIAGNOSIS — I739 Peripheral vascular disease, unspecified: Secondary | ICD-10-CM

## 2022-06-21 DIAGNOSIS — I7025 Atherosclerosis of native arteries of other extremities with ulceration: Secondary | ICD-10-CM

## 2022-06-21 HISTORY — PX: ABDOMINAL AORTOGRAM W/LOWER EXTREMITY: CATH118223

## 2022-06-21 LAB — POCT I-STAT, CHEM 8
BUN: 6 mg/dL — ABNORMAL LOW (ref 8–23)
Calcium, Ion: 1.26 mmol/L (ref 1.15–1.40)
Chloride: 104 mmol/L (ref 98–111)
Creatinine, Ser: 1.1 mg/dL (ref 0.61–1.24)
Glucose, Bld: 80 mg/dL (ref 70–99)
HCT: 39 % (ref 39.0–52.0)
Hemoglobin: 13.3 g/dL (ref 13.0–17.0)
Potassium: 4.6 mmol/L (ref 3.5–5.1)
Sodium: 141 mmol/L (ref 135–145)
TCO2: 25 mmol/L (ref 22–32)

## 2022-06-21 LAB — POCT ACTIVATED CLOTTING TIME
Activated Clotting Time: 227 seconds
Activated Clotting Time: 179 seconds

## 2022-06-21 SURGERY — ABDOMINAL AORTOGRAM W/LOWER EXTREMITY
Anesthesia: LOCAL | Laterality: Bilateral

## 2022-06-21 MED ORDER — IODIXANOL 320 MG/ML IV SOLN
INTRAVENOUS | Status: DC | PRN
  Administered 2022-06-21: 160 mL

## 2022-06-21 MED ORDER — SODIUM CHLORIDE 0.9% FLUSH: 3.0000 mL | Freq: Two times a day (BID) | INTRAVENOUS | Status: DC

## 2022-06-21 MED ORDER — LABETALOL HCL 5 MG/ML IV SOLN: 10.0000 mg | INTRAVENOUS | Status: DC | PRN

## 2022-06-21 MED ORDER — SODIUM CHLORIDE 0.9 % IV SOLN: 250.0000 mL | INTRAVENOUS | Status: DC | PRN

## 2022-06-21 MED ORDER — HEPARIN SODIUM (PORCINE) 1000 UNIT/ML IJ SOLN
INTRAMUSCULAR | Status: AC
  Filled 2022-06-21: qty 10

## 2022-06-21 MED ORDER — SODIUM CHLORIDE 0.9% FLUSH: 3.0000 mL | INTRAVENOUS | Status: DC | PRN

## 2022-06-21 MED ORDER — HEPARIN SODIUM (PORCINE) 1000 UNIT/ML IJ SOLN
INTRAMUSCULAR | Status: DC | PRN
  Administered 2022-06-21: 7500 [IU] via INTRAVENOUS

## 2022-06-21 MED ORDER — LIDOCAINE HCL (PF) 1 % IJ SOLN
INTRAMUSCULAR | Status: AC
  Filled 2022-06-21: qty 30

## 2022-06-21 MED ORDER — HEPARIN (PORCINE) IN NACL 1000-0.9 UT/500ML-% IV SOLN
INTRAVENOUS | Status: DC | PRN
  Administered 2022-06-21 (×2): 500 mL

## 2022-06-21 MED ORDER — SODIUM CHLORIDE 0.9 % WEIGHT BASED INFUSION: 1.0000 mL/kg/h | INTRAVENOUS | Status: DC

## 2022-06-21 MED ORDER — OXYCODONE HCL 5 MG PO TABS: 5.0000 mg | ORAL_TABLET | ORAL | Status: DC | PRN

## 2022-06-21 MED ORDER — ACETAMINOPHEN 325 MG PO TABS: 650.0000 mg | ORAL_TABLET | ORAL | Status: DC | PRN

## 2022-06-21 MED ORDER — LIDOCAINE HCL (PF) 1 % IJ SOLN
INTRAMUSCULAR | Status: DC | PRN
  Administered 2022-06-21: 15 mL

## 2022-06-21 MED ORDER — HEPARIN (PORCINE) IN NACL 1000-0.9 UT/500ML-% IV SOLN
INTRAVENOUS | Status: AC
  Filled 2022-06-21: qty 1000

## 2022-06-21 MED ORDER — SODIUM CHLORIDE 0.9 % IV SOLN: INTRAVENOUS | Status: DC

## 2022-06-21 MED ORDER — MORPHINE SULFATE (PF) 2 MG/ML IV SOLN: 2.0000 mg | INTRAVENOUS | Status: DC | PRN

## 2022-06-21 MED ORDER — HYDRALAZINE HCL 20 MG/ML IJ SOLN: 5.0000 mg | INTRAMUSCULAR | Status: DC | PRN

## 2022-06-21 MED ORDER — MIDAZOLAM HCL 2 MG/2ML IJ SOLN
INTRAMUSCULAR | Status: DC | PRN
  Administered 2022-06-21: 1 mg via INTRAVENOUS

## 2022-06-21 MED ORDER — ONDANSETRON HCL 4 MG/2ML IJ SOLN: 4.0000 mg | Freq: Four times a day (QID) | INTRAMUSCULAR | Status: DC | PRN

## 2022-06-21 MED ORDER — FENTANYL CITRATE (PF) 100 MCG/2ML IJ SOLN
INTRAMUSCULAR | Status: AC
  Filled 2022-06-21: qty 2

## 2022-06-21 MED ORDER — MIDAZOLAM HCL 2 MG/2ML IJ SOLN
INTRAMUSCULAR | Status: AC
  Filled 2022-06-21: qty 2

## 2022-06-21 MED ORDER — FENTANYL CITRATE (PF) 100 MCG/2ML IJ SOLN
INTRAMUSCULAR | Status: DC | PRN
  Administered 2022-06-21: 25 ug via INTRAVENOUS
  Administered 2022-06-21: 50 ug via INTRAVENOUS

## 2022-06-21 SURGICAL SUPPLY — 10 items
CATH ANGIO 5F BER2 100CM (CATHETERS) ×1 IMPLANT
CATH OMNI FLUSH 5F 65CM (CATHETERS) ×1 IMPLANT
KIT MICROPUNCTURE NIT STIFF (SHEATH) ×1 IMPLANT
KIT PV (KITS) ×2 IMPLANT
SHEATH FLEX ANSEL ANG 5F 45CM (SHEATH) ×1 IMPLANT
SHEATH PINNACLE 5F 10CM (SHEATH) ×1 IMPLANT
SYR MEDRAD MARK 7 150ML (SYRINGE) ×2 IMPLANT
TRANSDUCER W/STOPCOCK (MISCELLANEOUS) ×2 IMPLANT
TRAY PV CATH (CUSTOM PROCEDURE TRAY) ×2 IMPLANT
WIRE BENTSON .035X145CM (WIRE) ×1 IMPLANT

## 2022-06-21 NOTE — Progress Notes (Signed)
Patient ambulated to Bathroom. Went over instructions briefly with patient and friend but patient left before paper instructions were given.

## 2022-06-21 NOTE — Telephone Encounter (Signed)
Informed of results. May need ongoing follow up with nephro.   Myra Rude, MD [facility] 06/21/2022, 5:38 PM

## 2022-06-21 NOTE — Progress Notes (Signed)
Site area: left groin  Site Prior to Removal:  Level 0  Pressure Applied For 15 MINUTES    Minutes Beginning at 1027  Manual:   Yes.    Patient Status During Pull:  Stable  Post Pull Groin Site:  Level 0  Post Pull Instructions Given:  Yes.    Post Pull Pulses Present:  Yes.    Dressing Applied:  Yes.    Comments:  Sheath pull was done by Izora Ribas RN. Bed rest started 1045 X 4 hr.

## 2022-06-21 NOTE — Interval H&P Note (Signed)
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  The various methods of treatment have been discussed with the patient and family. After consideration of risks, benefits and other options for treatment, the patient has consented to  Procedure(s): ABDOMINAL AORTOGRAM W/LOWER EXTREMITY (N/A) as a surgical intervention.  The patient's history has been reviewed, patient examined, no change in status, stable for surgery.  I have reviewed the patient's chart and labs.  Questions were answered to the patient's satisfaction.     [name]

## 2022-06-21 NOTE — Op Note (Signed)
    Patient name: Jonathan Harrington MRN: [id number] DOB: 01/11/1956 Sex: male  06/21/2022 Pre-operative Diagnosis: right foot ulcer Post-operative diagnosis:  Same Surgeon:  [name] Procedure Performed:  1.  Ultrasound-guided access, left femoral artery  2.  Abdominal aortogram  3.  Bilateral lower extremity runoff  4.  Third-order catheterization  5.  Conscious sedation, 52 minutes    Indications: This is a 66 year old gentleman with nonhealing right toe wound who comes in today for angiographic evaluation.  Wi-Fi score [id number]  Procedure:  The patient was identified in the holding area and taken to room 8.  The patient was then placed supine on the table and prepped and draped in the usual sterile fashion.  A time out was called.  Conscious sedation was administered with the use of IV fentanyl and Versed under continuous physician and nurse monitoring.  Heart rate, blood pressure, and oxygen saturation were continuously monitored.  Total sedation time was 52 minutes.  Ultrasound was used to evaluate the left common femoral artery.  It was patent .  A digital ultrasound image was acquired.  A micropuncture needle was used to access the left common femoral artery under ultrasound guidance.  An 018 wire was advanced without resistance and a micropuncture sheath was placed.  The 018 wire was removed and a benson wire was placed.  The micropuncture sheath was exchanged for a 5 french sheath.  An omniflush catheter was advanced over the wire to the level of L-1.  An abdominal angiogram was obtained.  Next, using the omniflush catheter and a benson wire, the aortic bifurcation was crossed and the catheter was placed into theright external iliac artery and right runoff was obtained.  left runoff was performed via retrograde sheath injections.  Findings:   Aortogram: No significant renal artery stenosis.  The infrarenal abdominal aorta is heavily calcified but patent without significant stenosis.   Bilateral common and external iliac arteries are patent without hemodynamically significant stenosis but heavily calcified  Right Lower Extremity: Calcific disease in the right common femoral artery with stenosis less than 50%.  The superficial femoral artery is diffusely diseased without hemodynamically significant stenosis.  The popliteal artery occludes above the knee.  There are numerous geniculate collaterals that ultimately reconstitute the anterior tibial and the mid calf as well as the peroneal which of the 2 dominant runoff vessels.  Left Lower Extremity: Left common femoral and profundofemoral artery are widely patent.  There is flush occlusion of the left superficial femoral artery with reconstitution of the popliteal artery in the adductor canal which is patent down to the tibioperoneal trunk where there is diffuse disease.  The peroneal artery is the single-vessel runoff  Intervention: None  Impression:  #1  No significant aortoiliac occlusive disease  #2  Mild diffuse calcific disease throughout the superficial femoral artery without hemodynamically significant stenosis on the right.  #3  The right popliteal artery is occluded above the knee with reconstitution of the anterior tibial artery in the mid calf as well as the peroneal artery which of the 2 dominant runoff vessels.  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  There is diffuse disease at the origin of the tibioperoneal trunk with single-vessel runoff via the peroneal artery    V. [name], M.D., [facility] [place] Office: [phone] Pager:  [phone]

## 2022-06-21 NOTE — Progress Notes (Signed)
Bilateral LE vein mapping study completed. Please see CV Proc for preliminary results.  Levi Klaiber BS, RVT 06/21/2022 12:55 PM

## 2022-06-22 ENCOUNTER — Encounter (HOSPITAL_COMMUNITY): Payer: Self-pay | Admitting: Surgery

## 2022-06-23 ENCOUNTER — Telehealth: Payer: Self-pay | Admitting: Family Medicine

## 2022-06-23 DIAGNOSIS — F4321 Adjustment disorder with depressed mood: Secondary | ICD-10-CM

## 2022-06-23 DIAGNOSIS — F411 Generalized anxiety disorder: Secondary | ICD-10-CM

## 2022-06-23 MED ORDER — ALPRAZOLAM 0.25 MG PO TABS: 0.2500 mg | ORAL_TABLET | ORAL | 1 refills | Status: DC

## 2022-06-23 NOTE — Telephone Encounter (Signed)
Medication: ALPRAZolam (XANAX) 0.25 MG tablet  Has the patient contacted their pharmacy? No.  Preferred Pharmacy (with phone number or street name):   [id number] [place] - [address] AT [facility] & [address]  [address] [place], [place] [address]  Phone:  [phone]  Fax:  [phone]   Agent: Please be advised that RX refills may take up to 3 business days. We ask that you follow-up with your pharmacy.

## 2022-06-23 NOTE — Telephone Encounter (Signed)
Okay for refill?  

## 2022-06-28 ENCOUNTER — Other Ambulatory Visit: Payer: Self-pay | Admitting: *Deleted

## 2022-06-28 DIAGNOSIS — N138 Other obstructive and reflux uropathy: Secondary | ICD-10-CM | POA: Diagnosis not present

## 2022-06-28 DIAGNOSIS — N401 Enlarged prostate with lower urinary tract symptoms: Secondary | ICD-10-CM | POA: Diagnosis not present

## 2022-06-28 DIAGNOSIS — N289 Disorder of kidney and ureter, unspecified: Secondary | ICD-10-CM | POA: Diagnosis not present

## 2022-06-28 MED ORDER — GABAPENTIN 300 MG PO CAPS: 900.0000 mg | ORAL_CAPSULE | Freq: Three times a day (TID) | ORAL | 0 refills | Status: DC

## 2022-06-30 ENCOUNTER — Ambulatory Visit: Payer: Medicare PPO | Admitting: Surgery

## 2022-06-30 ENCOUNTER — Encounter: Payer: Self-pay | Admitting: Surgery

## 2022-06-30 VITALS — BP 142/76 | HR 105 | Temp 98.7°F | Resp 16

## 2022-06-30 DIAGNOSIS — I7025 Atherosclerosis of native arteries of other extremities with ulceration: Secondary | ICD-10-CM | POA: Diagnosis not present

## 2022-06-30 NOTE — Progress Notes (Signed)
[facility] [place]  Patient name: Jonathan Harrington MRN: [id number] DOB: 06/19/1956 Sex: male   REASON FOR VISIT:    Follow-up  HISOTRY OF PRESENT ILLNESS:    Jonathan Harrington is a 66 y.o. male who is a former patient of Dr. Arbie Cookey who had seen him for bilateral claudication in 2021.  At that time he underwent angiography which revealed a distal right superficial femoral artery occlusion with tibial vessel reconstitution.  Since he was only having claudication, intervention was not recommended at this time.  Unfortunately, the patient broke his leg in February and had hardware placed and was in a boot.  He developed a wound which has been present on his ankle since that time.  I saw him on June 19 and recommended angiography which was performed on 06/21/2022.  This again showed a right popliteal artery occlusion with anterior tibial reconstitution.  The patient is back today for discussions regarding tibial bypass graft.  Of note he does have a flush occlusion of the left superficial femoral artery.   The patient suffers from COPD secondary to smoking.  He is medically managed for hypertension.  He is on a statin now for hypercholesterolemia.   PAST MEDICAL HISTORY:   Past Medical History:  Diagnosis Date   Alcoholism (HCC)    Allergy    Anxiety    Arthritis    COPD (chronic obstructive pulmonary disease) (HCC)    Diverticulitis 2011   resulted in partial colectomy   Dyspnea    Essential hypertension 06/06/2016   Full dentures    Gout    Hematuria    History of kidney stones    Wears glasses      FAMILY HISTORY:   Family History  Problem Relation Age of Onset   Alzheimer's disease Mother    Cancer Sister    Neuropathy Neg Hx     SOCIAL HISTORY:   Social History   Tobacco Use   Smoking status: Every Day    Packs/day: 2.00    Types: Cigarettes   Smokeless tobacco: Never  Substance Use Topics   Alcohol use:  Yes    Alcohol/week: 21.0 standard drinks of alcohol    Types: 21 Cans of beer per week    Comment: about 3 beers daily- history of heavy liquor abuse     ALLERGIES:   Allergies  Allergen Reactions   Ibuprofen Swelling and Other (See Comments)    Hands and face swell- had to seek medical treatment.  No issue with celecoxib     CURRENT MEDICATIONS:   Current Outpatient Medications  Medication Sig Dispense Refill   allopurinol (ZYLOPRIM) 300 MG tablet Take 1 tablet (300 mg total) by mouth daily. 90 tablet 3   ALPRAZolam (XANAX) 0.25 MG tablet Take 1 tablet (0.25 mg total) by mouth See admin instructions. Take 0.25 mg by mouth in the morning and an additional 0.25 mg during the day or in the evening as needed for anxiety 45 tablet 1   amLODipine (NORVASC) 5 MG tablet Take 1 tablet (5 mg total) by mouth daily. 90 tablet 3   Ascorbic Acid 1000 MG TBCR Take 1,000 mg by mouth daily.     Cholecalciferol (VITAMIN D3) 25 MCG (1000 UT) CAPS Take 1,000 Units by mouth daily.     gabapentin (NEURONTIN) 300 MG capsule Take 3 capsules (900 mg total) by mouth 3 (three) times daily. 270 capsule 0   HYDROcodone-acetaminophen (NORCO) 7.5-325 MG tablet Take 1  tablet by mouth every 8 (eight) hours as needed for moderate pain. This is a 30 day supply- must last for 30 days 70 tablet 0   Melatonin 10 MG TABS Take 10 mg by mouth at bedtime.     rosuvastatin (CRESTOR) 20 MG tablet Take 1 tablet (20 mg total) by mouth daily. (Patient taking differently: Take 20 mg by mouth at bedtime.) 90 tablet 3   sildenafil (VIAGRA) 100 MG tablet Take 100 mg by mouth daily as needed for erectile dysfunction.     sulfamethoxazole-trimethoprim (BACTRIM DS) 800-160 MG tablet Take 1 tablet by mouth 2 (two) times daily. 20 tablet 0   Testosterone 1.62 % GEL Place 1 Pump onto the skin in the morning.     triamcinolone cream (KENALOG) 0.1 % Apply 1 application topically 2 (two) times daily as needed (eczema on leg). (Patient  taking differently: Apply 1 application  topically 2 (two) times daily as needed (eczema on legs and ankles).) 90 g 2   vitamin B-12 (CYANOCOBALAMIN) 1000 MCG tablet Take 1,000 mcg by mouth daily.     No current facility-administered medications for this visit.    REVIEW OF SYSTEMS:   [X]  denotes positive finding, [ ]  denotes negative finding Cardiac  Comments:  Chest pain or chest pressure:    Shortness of breath upon exertion:    Short of breath when lying flat:    Irregular heart rhythm:        Vascular    Pain in calf, thigh, or hip brought on by ambulation:    Pain in feet at night that wakes you up from your sleep:     Blood clot in your veins:    Leg swelling:         Pulmonary    Oxygen at home:    Productive cough:     Wheezing:         Neurologic    Sudden weakness in arms or legs:     Sudden numbness in arms or legs:     Sudden onset of difficulty speaking or slurred speech:    Temporary loss of vision in one eye:     Problems with dizziness:         Gastrointestinal    Blood in stool:     Vomited blood:         Genitourinary    Burning when urinating:     Blood in urine:        Psychiatric    Major depression:         Hematologic    Bleeding problems:    Problems with blood clotting too easily:        Skin    Rashes or ulcers:        Constitutional    Fever or chills:      PHYSICAL EXAM:   There were no vitals filed for this visit.  GENERAL: The patient is a well-nourished male, in no acute distress. The vital signs are documented above. CARDIAC: There is a regular rate and rhythm.  VASCULAR: Nonpalpable pedal pulses PULMONARY: Non-labored respirations ABDOMEN: Soft and non-tender with normal pitched bowel sounds.  MUSCULOSKELETAL: There are no major deformities or cyanosis. NEUROLOGIC: No focal weakness or paresthesias are detected. SKIN: See photo below PSYCHIATRIC: The patient has a normal affect.     STUDIES:   I have reviewed  his vein mapping with the following findings: +---------------+-----------+----------------------+---------------+-------  ----+    RT Diameter  RT Findings  GSV            LT Diameter  LT  Findings       (cm)                                            (cm)                    +---------------+-----------+----------------------+---------------+-------  ----+       0.43                     Saphenofemoral         0.45                                                     Junction                                    +---------------+-----------+----------------------+---------------+-------  ----+       0.32                     Proximal thigh         0.31                    +---------------+-----------+----------------------+---------------+-------  ----+       0.41                       Mid thigh            0.34                    +---------------+-----------+----------------------+---------------+-------  ----+       0.31                      Distal thigh          0.27                    +---------------+-----------+----------------------+---------------+-------  ----+       0.34                          Knee              0.31                    +---------------+-----------+----------------------+---------------+-------  ----+       0.35                       Prox calf            0.20                    +---------------+-----------+----------------------+---------------+-------  ----+       0.28                        Mid calf            0.21                    +---------------+-----------+----------------------+---------------+-------  ----+  0.23                      Distal calf           0.24                    +---------------+-----------+----------------------+---------------+-------  ----+       0.20                         Ankle              0.24                     +---------------+-----------+----------------------+---------------+-------  ----+   +----------------+-----------+---------------+----------------+-----------+   RT diameter (cm)RT Findings      SSV      LT Diameter (cm)LT  Findings  +----------------+-----------+---------------+----------------+-----------+         0.30                 Popliteal fossa                              +----------------+-----------+---------------+----------------+-----------+         0.34                  Proximal calf                               +----------------+-----------+---------------+----------------+-----------+         0.20                    Mid calf                                  +----------------+-----------+---------------+----------------+-----------+         0.12                   Distal calf                                +----------------+-----------+---------------+----------------+-----------+  MEDICAL ISSUES:   Right leg ulcer: The patient has had longstanding ulcer on the right lateral malleolus and anterior shin.  He recently underwent angiography that showed popliteal occlusion with distal reconstitution of the anterior tibial artery.  On my evaluation of his wound today, it does not appear infected and has clearly improved since I last saw him.  I discussed that since the wound has gotten better I think we can continue to monitor this.  If he does require revascularization I am not sure he has enough vein to originate his bypass from the common femoral artery.  In addition his anterior tibial artery is diseased down to just above the ankle where his skin is not very healthy.  I am worried that he may have difficulty healing a bypass.  Therefore, we collectively made the decision to continue to observe his wound and hope that he is able to get this to heal on its own.  If he develops any changes in the wound or he develops cellulitis, he will need to  have surgical revascularization.  I potentially could go to the anterior tibial artery in the mid calf above where his skin becomes unhealthy and perform  balloon angioplasty of the distal anterior tibial artery, or I could go all the way down to to his ankle where his skin is not healthy and could potentially cause issues with wound healing.  I have him scheduled to follow-up with me in 1 month.  He will contact me sooner if he develops complications.    Charlena Cross, MD, FACS [facility] [phone] Pager [phone]

## 2022-07-04 LAB — PATHOLOGIST SMEAR REVIEW
Basophils Absolute: 0 10*3/uL (ref 0.0–0.2)
Basos: 0 %
EOS (ABSOLUTE): 0.2 10*3/uL (ref 0.0–0.4)
Eos: 3 %
Hematocrit: 37.4 % — ABNORMAL LOW (ref 37.5–51.0)
Hemoglobin: 12.9 g/dL — ABNORMAL LOW (ref 13.0–17.7)
Immature Grans (Abs): 0 10*3/uL (ref 0.0–0.1)
Immature Granulocytes: 0 %
Lymphocytes Absolute: 1.8 10*3/uL (ref 0.7–3.1)
Lymphs: 29 %
MCH: 32.4 pg (ref 26.6–33.0)
MCHC: 34.5 g/dL (ref 31.5–35.7)
MCV: 94 fL (ref 79–97)
Monocytes Absolute: 0.4 10*3/uL (ref 0.1–0.9)
Monocytes: 6 %
Neutrophils Absolute: 3.7 10*3/uL (ref 1.4–7.0)
Neutrophils: 62 %
Platelets: 188 10*3/uL (ref 150–450)
RBC: 3.98 x10E6/uL — ABNORMAL LOW (ref 4.14–5.80)
RDW: 13.1 % (ref 11.6–15.4)
WBC: 6 10*3/uL (ref 3.4–10.8)

## 2022-07-12 ENCOUNTER — Ambulatory Visit: Payer: Medicare PPO | Admitting: Family Medicine

## 2022-07-18 ENCOUNTER — Telehealth: Payer: Self-pay | Admitting: Family Medicine

## 2022-07-18 DIAGNOSIS — G8929 Other chronic pain: Secondary | ICD-10-CM

## 2022-07-18 NOTE — Telephone Encounter (Signed)
Medication: HYDROcodone-acetaminophen (NORCO) 7.5-325 MG tablet   Has the patient contacted their pharmacy? Yes.     Preferred Pharmacy:  [facility] [address] [place], [place] [address]

## 2022-07-19 ENCOUNTER — Encounter: Payer: Self-pay | Admitting: Family Medicine

## 2022-07-19 DIAGNOSIS — G8929 Other chronic pain: Secondary | ICD-10-CM

## 2022-07-19 MED ORDER — HYDROCODONE-ACETAMINOPHEN 7.5-325 MG PO TABS: 1.0000 | ORAL_TABLET | Freq: Three times a day (TID) | ORAL | 0 refills | Status: DC | PRN

## 2022-07-19 NOTE — Telephone Encounter (Signed)
Okay for refill?  

## 2022-07-20 MED ORDER — HYDROCODONE-ACETAMINOPHEN 7.5-325 MG PO TABS: 1.0000 | ORAL_TABLET | Freq: Three times a day (TID) | ORAL | 0 refills | Status: DC | PRN

## 2022-07-25 ENCOUNTER — Other Ambulatory Visit: Payer: Self-pay | Admitting: Family Medicine

## 2022-07-25 DIAGNOSIS — E785 Hyperlipidemia, unspecified: Secondary | ICD-10-CM

## 2022-07-25 DIAGNOSIS — I10 Essential (primary) hypertension: Secondary | ICD-10-CM

## 2022-07-26 ENCOUNTER — Telehealth: Payer: Self-pay | Admitting: Family Medicine

## 2022-07-26 DIAGNOSIS — F4321 Adjustment disorder with depressed mood: Secondary | ICD-10-CM

## 2022-07-26 DIAGNOSIS — F411 Generalized anxiety disorder: Secondary | ICD-10-CM

## 2022-07-26 MED ORDER — ALPRAZOLAM 0.25 MG PO TABS: 0.2500 mg | ORAL_TABLET | ORAL | 1 refills | Status: DC

## 2022-07-26 NOTE — Telephone Encounter (Signed)
Medication: ALPRAZolam (XANAX) 0.25 MG tablet [[id number]]   Preferred Pharmacy (with phone number or street name):   [facility] #[id number] - [place], [place] - [address] AT [facility] OF [address]  [address] [place], [place] [address]  Phone:  [phone]  Fax:  [phone]      Agent: Please be advised that RX refills may take up to 3 business days. We ask that you follow-up with your pharmacy.

## 2022-08-01 ENCOUNTER — Ambulatory Visit: Payer: Medicare PPO | Admitting: Surgery

## 2022-08-01 ENCOUNTER — Encounter: Payer: Self-pay | Admitting: Surgery

## 2022-08-01 VITALS — BP 136/69 | HR 89 | Temp 98.3°F | Resp 20 | Ht 71.0 in | Wt 154.0 lb

## 2022-08-01 DIAGNOSIS — I7025 Atherosclerosis of native arteries of other extremities with ulceration: Secondary | ICD-10-CM

## 2022-08-01 NOTE — Progress Notes (Unsigned)
[facility] [place]  Patient name: Jonathan Harrington MRN: [id number] DOB: 1956-01-11 Sex: male   REASON FOR VISIT:    Follow up  HISOTRY OF PRESENT ILLNESS:     Jonathan Harrington is a 66 y.o. male who is a former patient of Dr. Arbie Cookey who had seen him for bilateral claudication in 2021.  At that time he underwent angiography which revealed a distal right superficial femoral artery occlusion with tibial vessel reconstitution.  Since he was only having claudication, intervention was not recommended at this time.  Unfortunately, the patient broke his leg in February and had hardware placed and was in a boot.  He developed a wound which has been present on his ankle since that time.  I saw him on June 19 and recommended angiography which was performed on 06/21/2022.  This again showed a right popliteal artery occlusion with anterior tibial reconstitution.  The patient is back today for discussions regarding tibial bypass graft.  Of note he does have a flush occlusion of the left superficial femoral artery.  I saw him on January 20 for discussions regarding surgery, however I felt that there has been significant improvement in his wound, and as result we decided to continue to monitor this without surgery.  He is back today for follow-up.     The patient suffers from COPD secondary to smoking.  He is medically managed for hypertension.  He is on a statin now for hypercholesterolemia.     PAST MEDICAL HISTORY:   Past Medical History:  Diagnosis Date   Alcoholism (HCC)    Allergy    Anxiety    Arthritis    COPD (chronic obstructive pulmonary disease) (HCC)    Diverticulitis 2011   resulted in partial colectomy   Dyspnea    Essential hypertension 06/06/2016   Full dentures    Gout    Hematuria    History of kidney stones    Wears glasses      FAMILY HISTORY:   Family History  Problem Relation Age of Onset   Alzheimer's disease  Mother    Cancer Sister    Neuropathy Neg Hx     SOCIAL HISTORY:   Social History   Tobacco Use   Smoking status: Every Day    Packs/day: 2.00    Types: Cigarettes   Smokeless tobacco: Never  Substance Use Topics   Alcohol use: Yes    Alcohol/week: 21.0 standard drinks of alcohol    Types: 21 Cans of beer per week    Comment: about 3 beers daily- history of heavy liquor abuse     ALLERGIES:   Allergies  Allergen Reactions   Ibuprofen Swelling and Other (See Comments)    Hands and face swell- had to seek medical treatment.  No issue with celecoxib     CURRENT MEDICATIONS:   Current Outpatient Medications  Medication Sig Dispense Refill   allopurinol (ZYLOPRIM) 300 MG tablet Take 1 tablet (300 mg total) by mouth daily. 90 tablet 3   ALPRAZolam (XANAX) 0.25 MG tablet Take 1 tablet (0.25 mg total) by mouth See admin instructions. Take 0.25 mg by mouth in the morning and an additional 0.25 mg during the day or in the evening as needed for anxiety 45 tablet 1   amLODipine (NORVASC) 5 MG tablet Take 1 tablet (5 mg total) by mouth daily. 90 tablet 3   Ascorbic Acid 1000 MG TBCR Take 1,000 mg by mouth daily.  Cholecalciferol (VITAMIN D3) 25 MCG (1000 UT) CAPS Take 1,000 Units by mouth daily.     gabapentin (NEURONTIN) 300 MG capsule Take 3 capsules (900 mg total) by mouth 3 (three) times daily. 270 capsule 0   HYDROcodone-acetaminophen (NORCO) 7.5-325 MG tablet Take 1 tablet by mouth every 8 (eight) hours as needed for moderate pain. This is a 30 day supply- must last for 30 days 70 tablet 0   Melatonin 10 MG TABS Take 10 mg by mouth at bedtime.     rosuvastatin (CRESTOR) 20 MG tablet TAKE 1 TABLET(20 MG) BY MOUTH DAILY 90 tablet 3   sildenafil (VIAGRA) 100 MG tablet Take 100 mg by mouth daily as needed for erectile dysfunction.     sulfamethoxazole-trimethoprim (BACTRIM DS) 800-160 MG tablet Take 1 tablet by mouth 2 (two) times daily. 20 tablet 0   Testosterone 1.62 % GEL  Place 1 Pump onto the skin in the morning.     triamcinolone cream (KENALOG) 0.1 % Apply 1 application topically 2 (two) times daily as needed (eczema on leg). (Patient taking differently: Apply 1 application  topically 2 (two) times daily as needed (eczema on legs and ankles).) 90 g 2   vitamin B-12 (CYANOCOBALAMIN) 1000 MCG tablet Take 1,000 mcg by mouth daily.     No current facility-administered medications for this visit.    REVIEW OF SYSTEMS:   [X]  denotes positive finding, [ ]  denotes negative finding Cardiac  Comments:  Chest pain or chest pressure:    Shortness of breath upon exertion:    Short of breath when lying flat:    Irregular heart rhythm:        Vascular    Pain in calf, thigh, or hip brought on by ambulation:    Pain in feet at night that wakes you up from your sleep:     Blood clot in your veins:    Leg swelling:         Pulmonary    Oxygen at home:    Productive cough:     Wheezing:         Neurologic    Sudden weakness in arms or legs:     Sudden numbness in arms or legs:     Sudden onset of difficulty speaking or slurred speech:    Temporary loss of vision in one eye:     Problems with dizziness:         Gastrointestinal    Blood in stool:     Vomited blood:         Genitourinary    Burning when urinating:     Blood in urine:        Psychiatric    Major depression:         Hematologic    Bleeding problems:    Problems with blood clotting too easily:        Skin    Rashes or ulcers:        Constitutional    Fever or chills:      PHYSICAL EXAM:   Vitals:   08/01/22 1222  BP: 136/69  Pulse: 89  Resp: 20  Temp: 98.3 F (36.8 C)  SpO2: 94%  Weight: 154 lb (69.9 kg)  Height: 5\' 11"  (1.803 m)    GENERAL: The patient is a well-nourished male, in no acute distress. The vital signs are documented above. CARDIAC: There is a regular rate and rhythm.  PULMONARY: Non-labored respirations ABDOMEN: Soft and non-tender with normal  pitched  bowel sounds.  MUSCULOSKELETAL: There are no major deformities or cyanosis. NEUROLOGIC: No focal weakness or paresthesias are detected. SKIN: See Photo below PSYCHIATRIC: The patient has a normal affect.     STUDIES:   none  MEDICAL ISSUES:   Wounds have nearly healed.  At this point, we will continue with monitoring.  Hopefully he can avoid bypass.  He will follow-up in 1 month I will do this as a phone visit as he has moved died and this will save him the 2-hour drive.    Charlena Cross, MD, FACS [facility] [phone] Pager [phone]

## 2022-08-02 ENCOUNTER — Other Ambulatory Visit: Payer: Self-pay | Admitting: Family Medicine

## 2022-08-02 DIAGNOSIS — I10 Essential (primary) hypertension: Secondary | ICD-10-CM

## 2022-08-18 ENCOUNTER — Telehealth: Payer: Self-pay | Admitting: Family Medicine

## 2022-08-18 ENCOUNTER — Encounter: Payer: Self-pay | Admitting: Family Medicine

## 2022-08-18 DIAGNOSIS — M545 Low back pain, unspecified: Secondary | ICD-10-CM

## 2022-08-18 MED ORDER — HYDROCODONE-ACETAMINOPHEN 7.5-325 MG PO TABS: 1.0000 | ORAL_TABLET | Freq: Three times a day (TID) | ORAL | 0 refills | Status: DC | PRN

## 2022-08-18 NOTE — Telephone Encounter (Signed)
Okay for refill?  

## 2022-08-18 NOTE — Telephone Encounter (Signed)
Pt has run out of rx and is hoping to get it a couple days early.   Medication:  HYDROcodone-acetaminophen (NORCO) 7.5-325 MG tablet   Has the patient contacted their pharmacy? No.  Preferred Pharmacy:   [facility] #[id number] - [name], [place] - [address] AT [facility] OF [address]   [address] [place], [place] [address]  Phone:  [phone]  Fax:  [phone]

## 2022-08-23 ENCOUNTER — Other Ambulatory Visit: Payer: Self-pay | Admitting: Surgery

## 2022-08-29 ENCOUNTER — Ambulatory Visit (INDEPENDENT_AMBULATORY_CARE_PROVIDER_SITE_OTHER): Payer: Medicare Other | Admitting: Surgery

## 2022-08-29 DIAGNOSIS — I7025 Atherosclerosis of native arteries of other extremities with ulceration: Secondary | ICD-10-CM

## 2022-08-29 NOTE — Progress Notes (Signed)
Vascular and Vein Specialist of [place]  Patient name: Jonathan Harrington MRN: [id number] DOB: [birth year] Sex: male      Virtual Visit via Telephone Note   Because of DUTCH ING co-morbid illnesses, he is at least at moderate risk for complications without adequate follow up.  This format is felt to be most appropriate for this patient at this time.  The patient did not have access to video technology/had technical difficulties with video requiring transitioning to audio format only (telephone).  All issues noted in this document were discussed and addressed.  No physical exam could be performed with this format.  Patient Location: Home Provider Location: Office/Clinic    REASON FOR APPOINTMENT:    Follow up  HISTORY OF PRESENT ILLNESS:   Jonathan Harrington is a 66 y.o. male who is a former patient of Dr. Arbie Cookey who had seen him for bilateral claudication in 2021.  At that time he underwent angiography which revealed a distal right superficial femoral artery occlusion with tibial vessel reconstitution.  Since he was only having claudication, intervention was not recommended at this time.  Unfortunately, the patient broke his leg in February and had hardware placed and was in a boot.  He developed a wound which has been present on his ankle since that time.  I saw him on June 19 and recommended angiography which was performed on 06/21/2022.  This again showed a right popliteal artery occlusion with anterior tibial reconstitution.  At his last visit, his wound had nearly healed, and so we decided against bypass.  Today we are doing a phone visit for wound follow up.  He says he does not have pain anymore at night and he is able to walk without a walker.  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  He is medically managed for hypertension.  He is on a statin now for  hypercholesterolemia.     PAST MEDICAL HISTORY    Past Medical History:  Diagnosis Date   Alcoholism [facility])    Allergy    Anxiety    Arthritis    COPD (chronic obstructive pulmonary disease) (HCC)    Diverticulitis 2011   resulted in partial colectomy   Dyspnea    Essential hypertension 06/06/2016   Full dentures    Gout    Hematuria    History of kidney stones    Wears glasses      FAMILY HISTORY   Family History  Problem Relation Age of Onset   Alzheimer's disease Mother    Cancer Sister    Neuropathy Neg Hx     SOCIAL HISTORY:   Social History   Socioeconomic History   Marital status: Widowed    Spouse name: died 04/06/2016   Number of children: 1   Years of education: Not on file   Highest education level: Not on file  Occupational History   Occupation: IT consultant  Tobacco Use   Smoking status: Every Day    Packs/day: 2.00    Types: Cigarettes   Smokeless tobacco: Never  Vaping Use   Vaping Use: Never used  Substance and Sexual Activity   Alcohol use: Yes    Alcohol/week: 21.0 standard drinks of alcohol    Types: 21 Cans of beer per week  Comment: about 3 beers daily- history of heavy liquor abuse   Drug use: No   Sexual activity: Not on file  Other Topics Concern   Not on file  Social History Narrative   Lives alone following the death of his wife 03/26/2016 following 42 years of marriage.   His son lives in the [place] area, and visits on weekends.   Social Determinants of Health   Financial Resource Strain: Not on file  Food Insecurity: Not on file  Transportation Needs: Not on file  Physical Activity: Not on file  Stress: Not on file  Social Connections: Not on file  Intimate Partner Violence: Not on file    ALLERGIES:    Allergies  Allergen Reactions   Ibuprofen Swelling and Other (See Comments)    Hands and face swell- had to seek medical treatment.  No issue with celecoxib    CURRENT MEDICATIONS:    Current  Outpatient Medications  Medication Sig Dispense Refill   allopurinol (ZYLOPRIM) 300 MG tablet Take 1 tablet (300 mg total) by mouth daily. 90 tablet 3   ALPRAZolam (XANAX) 0.25 MG tablet Take 1 tablet (0.25 mg total) by mouth See admin instructions. Take 0.25 mg by mouth in the morning and an additional 0.25 mg during the day or in the evening as needed for anxiety 45 tablet 1   amLODipine (NORVASC) 5 MG tablet Take 1 tablet (5 mg total) by mouth daily. 90 tablet 1   Ascorbic Acid 1000 MG TBCR Take 1,000 mg by mouth daily.     Cholecalciferol (VITAMIN D3) 25 MCG (1000 UT) CAPS Take 1,000 Units by mouth daily.     gabapentin (NEURONTIN) 300 MG capsule TAKE 3 CAPSULES(900 MG) BY MOUTH THREE TIMES DAILY 270 capsule 0   HYDROcodone-acetaminophen (NORCO) 7.5-325 MG tablet Take 1 tablet by mouth every 8 (eight) hours as needed for moderate pain. This is a 30 day supply- must last for 30 days 70 tablet 0   Melatonin 10 MG TABS Take 10 mg by mouth at bedtime.     rosuvastatin (CRESTOR) 20 MG tablet TAKE 1 TABLET(20 MG) BY MOUTH DAILY 90 tablet 3   sildenafil (VIAGRA) 100 MG tablet Take 100 mg by mouth daily as needed for erectile dysfunction.     sulfamethoxazole-trimethoprim (BACTRIM DS) 800-160 MG tablet TAKE 1 TABLET BY MOUTH TWICE DAILY 20 tablet 0   Testosterone 1.62 % GEL Place 1 Pump onto the skin in the morning.     triamcinolone cream (KENALOG) 0.1 % Apply 1 application topically 2 (two) times daily as needed (eczema on leg). (Patient taking differently: Apply 1 application  topically 2 (two) times daily as needed (eczema on legs and ankles).) 90 g 2   vitamin B-12 (CYANOCOBALAMIN) 1000 MCG tablet Take 1,000 mcg by mouth daily.     No current facility-administered medications for this visit.    REVIEW OF SYSTEMS:     PHYSICAL EXAM:      Recent Labs: 06/07/2022: ALT 18; Magnesium 1.9; Platelets 188 06/21/2022: BUN 6; Creatinine, Ser 1.10; Hemoglobin 13.3; Potassium 4.6; Sodium 141    Recent Lipid Panel Lab Results  Component Value Date/Time   CHOL 86 02/15/2021 03:32 PM   TRIG 162.0 (H) 02/15/2021 03:32 PM   HDL 35.70 (L) 02/15/2021 03:32 PM   CHOLHDL 2 02/15/2021 03:32 PM   LDLCALC 18 02/15/2021 03:32 PM   LDLCALC 44 08/24/2020 02:09 PM   LDLDIRECT 50.0 07/24/2019 11:14 AM    Wt Readings from Last 3  Encounters:  08/01/22 154 lb (69.9 kg)  06/21/22 160 lb (72.6 kg)  06/07/22 159 lb (72.1 kg)     STUDIES:     ASSESSMENT and PLAN   We discussed continuing to monitor the wound, as it continues to improve.  He will contact me if it gets worse or infected, otherwise I will check in with him in a month via phone     Time:   Today, I have spent 9 minutes with the patient with telehealth technology discussing the above problems.       Leia Alf, MD, FACS Vascular and Vein Specialists of [facility] [phone] Pager [phone]

## 2022-08-30 ENCOUNTER — Telehealth: Payer: Self-pay | Admitting: Family Medicine

## 2022-08-30 DIAGNOSIS — F411 Generalized anxiety disorder: Secondary | ICD-10-CM

## 2022-08-30 DIAGNOSIS — F4321 Adjustment disorder with depressed mood: Secondary | ICD-10-CM

## 2022-08-30 MED ORDER — ALPRAZOLAM 0.25 MG PO TABS: 0.2500 mg | ORAL_TABLET | ORAL | 1 refills | Status: DC

## 2022-08-30 NOTE — Telephone Encounter (Signed)
Medication:   ALPRAZolam (XANAX) 0.25 MG tablet [[id number]]  Has the patient contacted their pharmacy? No. (If no, request that the patient contact the pharmacy for the refill.) (If yes, when and what did the pharmacy advise?)  Preferred Pharmacy (with phone number or street name):   [facility] DRUG STORE [place]  [place], [place] [address]  Phone:  [phone]  Fax:  [phone]   Agent: Please be advised that RX refills may take up to 3 business days. We ask that you follow-up with your pharmacy.

## 2022-08-30 NOTE — Telephone Encounter (Signed)
Okay to refill? 

## 2022-08-31 ENCOUNTER — Telehealth: Payer: Self-pay | Admitting: Family Medicine

## 2022-08-31 ENCOUNTER — Other Ambulatory Visit: Payer: Self-pay | Admitting: *Deleted

## 2022-08-31 MED ORDER — GABAPENTIN 300 MG PO CAPS: ORAL_CAPSULE | ORAL | 1 refills | Status: DC

## 2022-08-31 NOTE — Telephone Encounter (Signed)
FYI-Patient has new insurance and DR. [name] is no longer in his network so he will be changing providers to someone closer to home since he has also moved to [place], [place].

## 2022-08-31 NOTE — Telephone Encounter (Signed)
FYI

## 2022-09-01 NOTE — Telephone Encounter (Signed)
error 

## 2022-09-05 ENCOUNTER — Ambulatory Visit: Payer: Medicare Other

## 2022-09-19 ENCOUNTER — Telehealth: Payer: Self-pay | Admitting: Family Medicine

## 2022-09-19 DIAGNOSIS — M545 Low back pain, unspecified: Secondary | ICD-10-CM

## 2022-09-19 NOTE — Telephone Encounter (Signed)
Medication: HYDROcodone-acetaminophen (NORCO) 7.5-325 MG tablet    Preferred Pharmacy (with phone number or street name): [facility] DR[facility] STORE #[id number] - [place] [address], [address] AT [address] [place], [place] [address] Phone: [phone]  Fax: [phone]  Agent: Please be advised that RX refills may take up to 3 business We ask that you follow-up with your pharmacy.

## 2022-09-20 MED ORDER — HYDROCODONE-ACETAMINOPHEN 7.5-325 MG PO TABS: 1.0000 | ORAL_TABLET | Freq: Three times a day (TID) | ORAL | 0 refills | Status: DC | PRN

## 2022-09-20 NOTE — Telephone Encounter (Signed)
Okay for refill?  

## 2022-09-20 NOTE — Progress Notes (Unsigned)
[place] at [facility] [address] [place], [place] [address] [phone]  Date:  09/21/2022   Name:  Jonathan Harrington   DOB:  06-04-1956   MRN:  [id number]  PCP:  Darreld Mclean, MD    Chief Complaint: No chief complaint on file.   History of Present Illness:  Jonathan Harrington is a 66 y.o. very pleasant male patient who presents with the following:  Patient is seen today for follow-up.  He had recently told me he plans to establish care closer to his new home, but he must have changed his mind Most recent visit with myself was a virtual visit in February  History of anxiety, diverticulitis, neuropathy, hypertension, chronic joint pain, heavy tobacco use He has neuropathy and lower limb claudication-much worse with standing or walking. 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  They discussed bypass surgery but this treatment did not be necessary  [facility] Recommend COVID booster Flu shot Time to update Cologuard He saw Dr. Trula Slade most recently in September, wound was doing well We will need to update UDS if he plans to continue seeing me  Lab Results  Component Value Date   PSA 0.44 08/24/2020   PSA 0.43 07/24/2019   PSA 0.99 06/20/2018    Lab Results  Component Value Date   HGBA1C 5.3 08/24/2020    Patient Active Problem List   Diagnosis Date Noted   Peripheral arterial disease ([place]) 06/07/2022   Anemia 06/07/2022   Closed  fracture of right tibia and fibula 01/17/2022   Tobacco use 01/17/2022   Alcohol use    Chronic pain    Hyperlipidemia    COPD (chronic obstructive pulmonary disease) ([place]) 12/20/2020   Neuropathy 06/04/2019   Alcoholism ([place]) 11/19/2018   Essential hypertension 06/06/2016   Smoker 05/12/2016   Left inguinal hernia 05/23/2014   Anxiety 09/17/2013   Gout 01/17/2013   DIVERTICULOSIS-COLON 08/13/2010   DIVERTICULITIS, COLON 08/13/2010   ABDOMINAL PAIN-LLQ 08/13/2010   Nonspecific (abnormal) findings on radiological and other examination of body structure 08/13/2010   PERSONAL HX COLONIC POLYPS 08/13/2010   NONSPCIFC ABN FINDING RAD & OTH EXAM LUNG FIELD 08/13/2010    Past Medical History:  Diagnosis Date   Alcoholism ([place])    Allergy    Anxiety    Arthritis    COPD (chronic obstructive pulmonary disease) ([place])    Diverticulitis 2011   resulted in partial colectomy   Dyspnea    Essential hypertension 06/06/2016   Full dentures    Gout    Hematuria    History of kidney stones    Wears glasses     Past Surgical History:  Procedure Laterality Date   ABDOMINAL AORTOGRAM W/LOWER EXTREMITY Bilateral 07/19/2019   Procedure: ABDOMINAL AORTOGRAM W/LOWER EXTREMITY;  Surgeon: Elam Dutch, MD;  Location: [facility];  Service: Cardiovascular;  Laterality: Bilateral;   ABDOMINAL AORTOGRAM W/LOWER EXTREMITY Bilateral 06/21/2022  Procedure: ABDOMINAL AORTOGRAM W/LOWER EXTREMITY;  Surgeon: Nada Libman, MD;  Location: [facility];  Service: Cardiovascular;  Laterality: Bilateral;   COLON SURGERY  2011   partial colectomy   COLONOSCOPY     FINGER ARTHROPLASTY  1990   rt index fx   HERNIA REPAIR     INGUINAL HERNIA REPAIR Left 05/27/2014   Procedure: LEFT INGUINAL HERNIA REPAIR WITH MESH;  Surgeon: Wilmon Arms. Corliss Skains, MD;  Location: [facility];  Service: General;  Laterality: Left;   INSERTION OF MESH Left 05/27/2014   Procedure: INSERTION OF MESH;   Surgeon: Wilmon Arms. Corliss Skains, MD;  Location: [facility];  Service: General;  Laterality: Left;   TIBIA IM NAIL INSERTION Right 01/18/2022   Procedure: INTRAMEDULLARY (IM) NAIL TIBIAL;  Surgeon: Terance Hart, MD;  Location: [facility];  Service: Orthopedics;  Laterality: Right;    Social History   Tobacco Use   Smoking status: Every Day    Packs/day: 2.00    Types: Cigarettes   Smokeless tobacco: Never  Vaping Use   Vaping Use: Never used  Substance Use Topics   Alcohol use: Yes    Alcohol/week: 21.0 standard drinks of alcohol    Types: 21 Cans of beer per week    Comment: about 3 beers daily- history of heavy liquor abuse   Drug use: No    Family History  Problem Relation Age of Onset   Alzheimer's disease Mother    Cancer Sister    Neuropathy Neg Hx     Allergies  Allergen Reactions   Ibuprofen Swelling and Other (See Comments)    Hands and face swell- had to seek medical treatment.  No issue with celecoxib    Medication list has been reviewed and updated.  Current Outpatient Medications on File Prior to Visit  Medication Sig Dispense Refill   allopurinol (ZYLOPRIM) 300 MG tablet Take 1 tablet (300 mg total) by mouth daily. 90 tablet 3   ALPRAZolam (XANAX) 0.25 MG tablet Take 1 tablet (0.25 mg total) by mouth See admin instructions. Take 0.25 mg by mouth in the morning and an additional 0.25 mg during the day or in the evening as needed for anxiety 45 tablet 1   amLODipine (NORVASC) 5 MG tablet Take 1 tablet (5 mg total) by mouth daily. 90 tablet 1   Ascorbic Acid 1000 MG TBCR Take 1,000 mg by mouth daily.     Cholecalciferol (VITAMIN D3) 25 MCG (1000 UT) CAPS Take 1,000 Units by mouth daily.     gabapentin (NEURONTIN) 300 MG capsule TAKE 3 CAPSULES(900 MG) BY MOUTH THREE TIMES DAILY 270 capsule 1   HYDROcodone-acetaminophen (NORCO) 7.5-325 MG tablet Take 1 tablet by mouth every 8 (eight) hours as needed for moderate pain. This is a 30 day supply- must last  for 30 days 70 tablet 0   Melatonin 10 MG TABS Take 10 mg by mouth at bedtime.     rosuvastatin (CRESTOR) 20 MG tablet TAKE 1 TABLET(20 MG) BY MOUTH DAILY 90 tablet 3   sildenafil (VIAGRA) 100 MG tablet Take 100 mg by mouth daily as needed for erectile dysfunction.     sulfamethoxazole-trimethoprim (BACTRIM DS) 800-160 MG tablet TAKE 1 TABLET BY MOUTH TWICE DAILY 20 tablet 0   Testosterone 1.62 % GEL Place 1 Pump onto the skin in the morning.     triamcinolone cream (KENALOG) 0.1 % Apply 1 application topically 2 (two) times daily as needed (eczema on leg). (Patient  taking differently: Apply 1 application  topically 2 (two) times daily as needed (eczema on legs and ankles).) 90 g 2   vitamin B-12 (CYANOCOBALAMIN) 1000 MCG tablet Take 1,000 mcg by mouth daily.     No current facility-administered medications on file prior to visit.    Review of Systems:  As per HPI- otherwise negative.   Physical Examination: There were no vitals filed for this visit. There were no vitals filed for this visit. There is no height or weight on file to calculate BMI. Ideal Body Weight:    GEN: no acute distress. HEENT: Atraumatic, Normocephalic.  Ears and Nose: No external deformity. CV: RRR, No M/G/R. No JVD. No thrill. No extra heart sounds. PULM: CTA B, no wheezes, crackles, rhonchi. No retractions. No resp. distress. No accessory muscle use. ABD: S, NT, ND, +BS. No rebound. No HSM. EXTR: No c/c/e PSYCH: Normally interactive. Conversant.    Assessment and Plan: ***  Signed Abbe Amsterdam, MD

## 2022-09-20 NOTE — Patient Instructions (Incomplete)
I recommend that you get the COVID booster, shingles vaccine, RSV at your pharmacy Flu shot today Ordered your Cologuard kit to be delivered to your home today I will be in touch with your labs asap- please go to the lab to give blood and urine today  Stop by imaging on the ground floor for your CT scan today

## 2022-09-21 ENCOUNTER — Encounter: Payer: Self-pay | Admitting: Family Medicine

## 2022-09-21 ENCOUNTER — Ambulatory Visit (INDEPENDENT_AMBULATORY_CARE_PROVIDER_SITE_OTHER): Payer: Medicare Other | Admitting: Family Medicine

## 2022-09-21 VITALS — BP 138/70 | HR 85 | Temp 97.6°F | Resp 18 | Ht 72.0 in | Wt 155.2 lb

## 2022-09-21 DIAGNOSIS — I10 Essential (primary) hypertension: Secondary | ICD-10-CM

## 2022-09-21 DIAGNOSIS — E785 Hyperlipidemia, unspecified: Secondary | ICD-10-CM | POA: Diagnosis not present

## 2022-09-21 DIAGNOSIS — Z1211 Encounter for screening for malignant neoplasm of colon: Secondary | ICD-10-CM | POA: Diagnosis not present

## 2022-09-21 DIAGNOSIS — N189 Chronic kidney disease, unspecified: Secondary | ICD-10-CM | POA: Diagnosis not present

## 2022-09-21 DIAGNOSIS — Z122 Encounter for screening for malignant neoplasm of respiratory organs: Secondary | ICD-10-CM

## 2022-09-21 DIAGNOSIS — M545 Low back pain, unspecified: Secondary | ICD-10-CM | POA: Diagnosis not present

## 2022-09-21 DIAGNOSIS — Z131 Encounter for screening for diabetes mellitus: Secondary | ICD-10-CM | POA: Diagnosis not present

## 2022-09-21 DIAGNOSIS — F411 Generalized anxiety disorder: Secondary | ICD-10-CM

## 2022-09-21 DIAGNOSIS — Z23 Encounter for immunization: Secondary | ICD-10-CM

## 2022-09-21 DIAGNOSIS — Z125 Encounter for screening for malignant neoplasm of prostate: Secondary | ICD-10-CM

## 2022-09-21 DIAGNOSIS — G8929 Other chronic pain: Secondary | ICD-10-CM

## 2022-09-21 LAB — COMPREHENSIVE METABOLIC PANEL
ALT: 20 U/L (ref 0–53)
AST: 31 U/L (ref 0–37)
Albumin: 4.2 g/dL (ref 3.5–5.2)
Alkaline Phosphatase: 73 U/L (ref 39–117)
BUN: 17 mg/dL (ref 6–23)
CO2: 28 mEq/L (ref 19–32)
Calcium: 9.3 mg/dL (ref 8.4–10.5)
Chloride: 103 mEq/L (ref 96–112)
Creatinine, Ser: 1.06 mg/dL (ref 0.40–1.50)
GFR: 72.11 mL/min (ref >60.00)
Glucose, Bld: 105 mg/dL — ABNORMAL HIGH (ref 70–99)
Potassium: 4.6 mEq/L (ref 3.5–5.1)
Sodium: 138 mEq/L (ref 135–145)
Total Bilirubin: 0.6 mg/dL (ref 0.2–1.2)
Total Protein: 7.2 g/dL (ref 6.0–8.3)

## 2022-09-21 LAB — HEMOGLOBIN A1C: Hgb A1c MFr Bld: 5.6 % (ref 4.6–6.5)

## 2022-09-21 LAB — LIPID PANEL
Cholesterol: 106 mg/dL (ref 0–200)
HDL: 52.8 mg/dL (ref >39.00)
LDL Cholesterol: 36 mg/dL (ref 0–99)
NonHDL: 53.68
Total CHOL/HDL Ratio: 2
Triglycerides: 86 mg/dL (ref 0.0–149.0)
VLDL: 17.2 mg/dL (ref 0.0–40.0)

## 2022-09-21 LAB — CBC
HCT: 39.6 % (ref 39.0–52.0)
Hemoglobin: 13.6 g/dL (ref 13.0–17.0)
MCHC: 34.3 g/dL (ref 30.0–36.0)
MCV: 98.3 fl (ref 78.0–100.0)
Platelets: 136 10*3/uL — ABNORMAL LOW (ref 150.0–400.0)
RBC: 4.03 Mil/uL — ABNORMAL LOW (ref 4.22–5.81)
RDW: 13.7 % (ref 11.5–15.5)
WBC: 4.7 10*3/uL (ref 4.0–10.5)

## 2022-09-25 LAB — DRUG MONITORING, PANEL 8 WITH CONFIRMATION, URINE
6 Acetylmorphine: NEGATIVE ng/mL (ref <10)
Alcohol Metabolites: POSITIVE ng/mL — AB (ref <500)
Alphahydroxyalprazolam: 58 ng/mL — ABNORMAL HIGH (ref <25)
Alphahydroxymidazolam: NEGATIVE ng/mL (ref <50)
Alphahydroxytriazolam: NEGATIVE ng/mL (ref <50)
Aminoclonazepam: NEGATIVE ng/mL (ref <25)
Amphetamines: NEGATIVE ng/mL (ref <500)
Benzodiazepines: POSITIVE ng/mL — AB (ref <100)
Buprenorphine, Urine: NEGATIVE ng/mL (ref <5)
Cocaine Metabolite: NEGATIVE ng/mL (ref <150)
Codeine: NEGATIVE ng/mL (ref <50)
Creatinine: 103.1 mg/dL (ref >=20.0)
Ethyl Glucuronide (ETG): >10000 ng/mL — ABNORMAL HIGH (ref <500)
Ethyl Sulfate (ETS): >10000 ng/mL — ABNORMAL HIGH (ref <100)
Hydrocodone: 1032 ng/mL — ABNORMAL HIGH (ref <50)
Hydromorphone: 225 ng/mL — ABNORMAL HIGH (ref <50)
Hydroxyethylflurazepam: NEGATIVE ng/mL (ref <50)
Lorazepam: NEGATIVE ng/mL (ref <50)
MDMA: NEGATIVE ng/mL (ref <500)
Marijuana Metabolite: NEGATIVE ng/mL (ref <20)
Morphine: NEGATIVE ng/mL (ref <50)
Nordiazepam: NEGATIVE ng/mL (ref <50)
Norhydrocodone: 814 ng/mL — ABNORMAL HIGH (ref <50)
Opiates: POSITIVE ng/mL — AB (ref <100)
Oxazepam: NEGATIVE ng/mL (ref <50)
Oxidant: NEGATIVE ug/mL (ref <200)
Oxycodone: NEGATIVE ng/mL (ref <100)
Temazepam: NEGATIVE ng/mL (ref <50)
pH: 6.9 (ref 4.5–9.0)

## 2022-09-25 LAB — DM TEMPLATE

## 2022-10-03 ENCOUNTER — Ambulatory Visit: Payer: Medicare Other | Admitting: Surgery

## 2022-10-03 ENCOUNTER — Encounter: Payer: Self-pay | Admitting: Family Medicine

## 2022-10-03 DIAGNOSIS — I7025 Atherosclerosis of native arteries of other extremities with ulceration: Secondary | ICD-10-CM

## 2022-10-03 NOTE — Progress Notes (Signed)
Vascular and Vein Specialist of   Patient name: Jonathan Harrington MRN: [id number] DOB: 03/22/1956 Sex: male      Virtual Visit via Telephone Note   Because of Jonathan Harrington co-morbid illnesses, he is at least at moderate risk for complications without adequate follow up.  This format is felt to be most appropriate for this patient at this time.  The patient did not have access to video technology/had technical difficulties with video requiring transitioning to audio format only (telephone).  All issues noted in this document were discussed and addressed.  No physical exam could be performed with this format.  Patient Location: Home Provider Location: Office/Clinic   REASON FOR APPOINTMENT:    Wound check  HISTORY OF PRESENT ILLNESS:   Jonathan Harrington is a 66 y.o. male who is a former patient of Dr. Arbie Cookey who had seen him for bilateral claudication in 2021.  At that time he underwent angiography which revealed a distal right superficial femoral artery occlusion with tibial vessel reconstitution.  Since he was only having claudication, intervention was not recommended at this time.  Unfortunately, the patient broke his leg in February and had hardware placed and was in a boot.  He developed a wound which has been present on his ankle since that time.  I saw him on June 19 and recommended angiography which was performed on 06/21/2022.  This again showed a right popliteal artery occlusion with anterior tibial reconstitution.  At his next visit, his wound had nearly healed, and so we decided against bypass.  Today we are doing a phone visit for wound follow up.  He says he does not have pain anymore at night and he is able to walk without a walker.  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  He is medically managed for hypertension.  He is on a statin now for  hypercholesterolemia.  PAST MEDICAL HISTORY    Past Medical History:  Diagnosis Date   Alcoholism [facility]    Allergy    Anxiety    Arthritis    COPD (chronic obstructive pulmonary disease) (HCC)    Diverticulitis 2011   resulted in partial colectomy   Dyspnea    Essential hypertension 06/06/2016   Full dentures    Gout    Hematuria    History of kidney stones    Wears glasses      FAMILY HISTORY   Family History  Problem Relation Age of Onset   Alzheimer's disease Mother    Cancer Sister    Neuropathy Neg Hx     SOCIAL HISTORY:   Social History   Socioeconomic History   Marital status: Widowed    Spouse name: died 03-28-16   Number of children: 1   Years of education: Not on file   Highest education level: Not on file  Occupational History   Occupation: IT consultant  Tobacco Use   Smoking status: Every Day    Packs/day: 2.00    Types: Cigarettes   Smokeless tobacco: Never  Vaping Use   Vaping Use: Never used  Substance and Sexual Activity   Alcohol use: Yes    Alcohol/week: 21.0 standard drinks of alcohol    Types: 21 Cans of beer per week    Comment: about 3 beers  daily- history of heavy liquor abuse   Drug use: No   Sexual activity: Not on file  Other Topics Concern   Not on file  Social History Narrative   Lives alone following the death of his wife 03/23/2016 following 89 years of marriage.   His son lives in the [place] area, and visits on weekends.   Social Determinants of Health   Financial Resource Strain: Not on file  Food Insecurity: Not on file  Transportation Needs: Not on file  Physical Activity: Not on file  Stress: Not on file  Social Connections: Not on file  Intimate Partner Violence: Not on file    ALLERGIES:    Allergies  Allergen Reactions   Ibuprofen Swelling and Other (See Comments)    Hands and face swell- had to seek medical treatment.  No issue with celecoxib    CURRENT MEDICATIONS:    Current  Outpatient Medications  Medication Sig Dispense Refill   allopurinol (ZYLOPRIM) 300 MG tablet Take 1 tablet (300 mg total) by mouth daily. 90 tablet 3   ALPRAZolam (XANAX) 0.25 MG tablet Take 1 tablet (0.25 mg total) by mouth See admin instructions. Take 0.25 mg by mouth in the morning and an additional 0.25 mg during the day or in the evening as needed for anxiety 45 tablet 1   amLODipine (NORVASC) 5 MG tablet Take 1 tablet (5 mg total) by mouth daily. 90 tablet 1   Ascorbic Acid 1000 MG TBCR Take 1,000 mg by mouth daily.     Cholecalciferol (VITAMIN D3) 25 MCG (1000 UT) CAPS Take 1,000 Units by mouth daily.     gabapentin (NEURONTIN) 300 MG capsule TAKE 3 CAPSULES(900 MG) BY MOUTH THREE TIMES DAILY 270 capsule 1   HYDROcodone-acetaminophen (NORCO) 7.5-325 MG tablet Take 1 tablet by mouth every 8 (eight) hours as needed for moderate pain. This is a 30 day supply- must last for 30 days 70 tablet 0   Melatonin 10 MG TABS Take 10 mg by mouth at bedtime.     rosuvastatin (CRESTOR) 20 MG tablet TAKE 1 TABLET(20 MG) BY MOUTH DAILY 90 tablet 3   sildenafil (VIAGRA) 100 MG tablet Take 100 mg by mouth daily as needed for erectile dysfunction.     sulfamethoxazole-trimethoprim (BACTRIM DS) 800-160 MG tablet TAKE 1 TABLET BY MOUTH TWICE DAILY 20 tablet 0   Testosterone 1.62 % GEL Place 1 Pump onto the skin in the morning.     triamcinolone cream (KENALOG) 0.1 % Apply 1 application topically 2 (two) times daily as needed (eczema on leg). (Patient taking differently: Apply 1 application  topically 2 (two) times daily as needed (eczema on legs and ankles).) 90 g 2   vitamin B-12 (CYANOCOBALAMIN) 1000 MCG tablet Take 1,000 mcg by mouth daily.     No current facility-administered medications for this visit.    REVIEW OF SYSTEMS:   Please see the history of present illness.     All other systems reviewed and are negative.  PHYSICAL EXAM:     Recent Labs: 06/07/2022: Magnesium 1.9 09/21/2022: ALT 20;  BUN 17; Creatinine, Ser 1.06; Hemoglobin 13.6; Platelets 136.0; Potassium 4.6; Sodium 138   Recent Lipid Panel Lab Results  Component Value Date/Time   CHOL 106 09/21/2022 11:50 AM   TRIG 86.0 09/21/2022 11:50 AM   HDL 52.80 09/21/2022 11:50 AM   CHOLHDL 2 09/21/2022 11:50 AM   LDLCALC 36 09/21/2022 11:50 AM   LDLCALC 44 08/24/2020 02:09 PM   LDLDIRECT 50.0 07/24/2019  11:14 AM    Wt Readings from Last 3 Encounters:  09/21/22 155 lb 3.2 oz (70.4 kg)  08/01/22 154 lb (69.9 kg)  06/21/22 160 lb (72.6 kg)     STUDIES:   none  ASSESSMENT and PLAN   I tried to contact the patient several times and left a voicemail.  I was never able to get a hold of them.  I will try to get him rescheduled       [name], IV, MD, FACS [facility]ascular and Vein Specialists of [facility] [phone] Pager [phone]

## 2022-10-05 ENCOUNTER — Ambulatory Visit (HOSPITAL_BASED_OUTPATIENT_CLINIC_OR_DEPARTMENT_OTHER): Payer: Medicare Other

## 2022-10-20 ENCOUNTER — Telehealth: Payer: Self-pay | Admitting: Family Medicine

## 2022-10-20 ENCOUNTER — Other Ambulatory Visit: Payer: Self-pay | Admitting: Family Medicine

## 2022-10-20 DIAGNOSIS — G8929 Other chronic pain: Secondary | ICD-10-CM

## 2022-10-20 MED ORDER — HYDROCODONE-ACETAMINOPHEN 7.5-325 MG PO TABS: 1.0000 | ORAL_TABLET | Freq: Three times a day (TID) | ORAL | 0 refills | Status: DC | PRN

## 2022-10-20 NOTE — Telephone Encounter (Signed)
Okay for refill?  

## 2022-10-20 NOTE — Telephone Encounter (Signed)
Medication: HYDROcodone-acetaminophen (NORCO) 7.5-325 MG tablet    Preferred Pharmacy (with phone number or street name):  [facility] #[id number] - [name], [place] - [address] AT [facility] [address] [place], [place] [address] Phone: [phone]  Fax: [phone]    Agent: Please be advised that RX refills may take up to 3 business days. We ask that you follow-up with your pharmacy.

## 2022-11-21 ENCOUNTER — Telehealth: Payer: Self-pay | Admitting: Family Medicine

## 2022-11-21 DIAGNOSIS — F4321 Adjustment disorder with depressed mood: Secondary | ICD-10-CM

## 2022-11-21 DIAGNOSIS — F411 Generalized anxiety disorder: Secondary | ICD-10-CM

## 2022-11-21 DIAGNOSIS — M545 Low back pain, unspecified: Secondary | ICD-10-CM

## 2022-11-21 MED ORDER — HYDROCODONE-ACETAMINOPHEN 7.5-325 MG PO TABS: 1.0000 | ORAL_TABLET | Freq: Three times a day (TID) | ORAL | 0 refills | Status: DC | PRN

## 2022-11-21 MED ORDER — ALPRAZOLAM 0.25 MG PO TABS: 0.2500 mg | ORAL_TABLET | ORAL | 1 refills | Status: DC

## 2022-11-21 NOTE — Telephone Encounter (Signed)
Pt is hoping to also increase his xanax rx. He states he is running out quicker than the 45 days he is supplied an dis hoping to get at least 60 days worth.   Medication: HYDROcodone-acetaminophen (NORCO) 7.5-325 MG tablet  Has the patient contacted their pharmacy? No.   Preferred Pharmacy:    [facility]ORE #[id number] - [name], [place] - [address] AT [facility] OF [address] [place], [place] [address] Phone: [phone]  Fax: [phone]

## 2022-11-21 NOTE — Addendum Note (Signed)
Addended by: Abbe Amsterdam C on: 11/21/2022 04:44 PM   Modules accepted: Orders

## 2022-11-21 NOTE — Telephone Encounter (Signed)
Okay for refill?  

## 2022-12-28 ENCOUNTER — Telehealth: Payer: Self-pay | Admitting: Family Medicine

## 2022-12-28 DIAGNOSIS — F4321 Adjustment disorder with depressed mood: Secondary | ICD-10-CM

## 2022-12-28 DIAGNOSIS — F411 Generalized anxiety disorder: Secondary | ICD-10-CM

## 2022-12-28 DIAGNOSIS — M545 Low back pain, unspecified: Secondary | ICD-10-CM

## 2022-12-28 MED ORDER — ALPRAZOLAM 0.25 MG PO TABS: 0.2500 mg | ORAL_TABLET | ORAL | 1 refills | Status: DC

## 2022-12-28 MED ORDER — HYDROCODONE-ACETAMINOPHEN 7.5-325 MG PO TABS: 1.0000 | ORAL_TABLET | Freq: Three times a day (TID) | ORAL | 0 refills | Status: DC | PRN

## 2022-12-28 NOTE — Telephone Encounter (Signed)
Prescription Request  12/28/2022  Is this a "Controlled Substance" medicine? Yes  LOV: 09/21/2022  What is the name of the medication or equipment?  1.HYDROcodone-acetaminophen (NORCO) 7.5-325 MG tablet   2.ALPRAZolam (XANAX) 0.25 MG tablet   Have you contacted your pharmacy to request a refill? No   Which pharmacy would you like this sent to?  [facility] [place], [place] [address] Phone: [phone] Fax: [phone]    Patient notified that their request is being sent to the clinical staff for review and that they should receive a response within 2 business days.   Please advise at [place]

## 2022-12-30 ENCOUNTER — Other Ambulatory Visit: Payer: Self-pay | Admitting: Family Medicine

## 2023-01-16 ENCOUNTER — Telehealth: Payer: Self-pay | Admitting: Family Medicine

## 2023-01-16 NOTE — Telephone Encounter (Signed)
Patient called requesting a call back from Dr. Lorelei Pont to discuss some issues that were mention on a prior appointment. He was unable to go into detailed and stating it had to do with dementia. Pt declined an OV. Please advise.

## 2023-01-16 NOTE — Telephone Encounter (Signed)
Called patient, no answer.  Left message on machine.  Please send me a MyChart message with concerns and I will be glad to respond to him

## 2023-01-27 ENCOUNTER — Encounter: Payer: Self-pay | Admitting: Family Medicine

## 2023-02-01 ENCOUNTER — Telehealth: Payer: Self-pay | Admitting: Family Medicine

## 2023-02-01 DIAGNOSIS — G8929 Other chronic pain: Secondary | ICD-10-CM

## 2023-02-01 NOTE — Telephone Encounter (Signed)
Prescription Request  02/01/2023  Is this a "Controlled Substance" medicine? Yes  LOV: 09/21/2022  What is the name of the medication or equipment?  HYDROcodone-acetaminophen (NORCO) 7.5-325 MG tablet   ALPRAZolam (XANAX) 0.25 MG tablet    Have you contacted your pharmacy to request a refill? No   Which pharmacy would you like this sent to?  [facility] ST[facility] [place], [place] [address] Phone: [phone] Fax: [phone]    Patient notified that their request is being sent to the clinical staff for review and that they should receive a response within 2 business days.   Please advise at Mobile [phone] (mobile)

## 2023-02-02 MED ORDER — HYDROCODONE-ACETAMINOPHEN 7.5-325 MG PO TABS: 1.0000 | ORAL_TABLET | Freq: Three times a day (TID) | ORAL | 0 refills | Status: DC | PRN

## 2023-02-14 ENCOUNTER — Telehealth: Payer: Self-pay | Admitting: Family Medicine

## 2023-02-14 ENCOUNTER — Other Ambulatory Visit: Payer: Self-pay | Admitting: Family Medicine

## 2023-02-14 DIAGNOSIS — M1000 Idiopathic gout, unspecified site: Secondary | ICD-10-CM

## 2023-02-14 NOTE — Telephone Encounter (Signed)
Pharmacy has been updated.

## 2023-02-14 NOTE — Telephone Encounter (Signed)
Pt called stating he would like to remove the [place] from his list and only send meds to the one in [facility] on his list.

## 2023-02-27 ENCOUNTER — Telehealth: Payer: Self-pay | Admitting: Family Medicine

## 2023-02-27 ENCOUNTER — Encounter: Payer: Self-pay | Admitting: Family Medicine

## 2023-02-27 DIAGNOSIS — F4321 Adjustment disorder with depressed mood: Secondary | ICD-10-CM

## 2023-02-27 DIAGNOSIS — G8929 Other chronic pain: Secondary | ICD-10-CM

## 2023-02-27 DIAGNOSIS — F411 Generalized anxiety disorder: Secondary | ICD-10-CM

## 2023-02-27 MED ORDER — HYDROCODONE-ACETAMINOPHEN 7.5-325 MG PO TABS: 1.0000 | ORAL_TABLET | Freq: Three times a day (TID) | ORAL | 0 refills | Status: DC | PRN

## 2023-02-27 MED ORDER — ALPRAZOLAM 0.25 MG PO TABS: 0.2500 mg | ORAL_TABLET | ORAL | 1 refills | Status: DC

## 2023-02-27 NOTE — Addendum Note (Signed)
Addended by: Lamar Blinks C on: 02/27/2023 04:53 PM   Modules accepted: Orders

## 2023-02-27 NOTE — Telephone Encounter (Signed)
Prescription Request  02/27/2023  Is this a "Controlled Substance" medicine? Yes  LOV: 09/21/2022  What is the name of the medication or equipment?  1.HYDROcodone-acetaminophen (NORCO) 7.5-325 MG tablet  2.ALPRAZolam (XANAX) 0.25 MG tablet   Have you contacted your pharmacy to request a refill? No   Which pharmacy would you like this sent to?  [facility] ST[facility] [id number] - [place], [address] [place] [address] Phone: [phone] Fax: [phone]    Patient notified that their request is being sent to the clinical staff for review and that they should receive a response within 2 business days.   Please advise at [place]

## 2023-02-27 NOTE — Telephone Encounter (Signed)
Okay for refills.

## 2023-02-28 NOTE — Telephone Encounter (Signed)
Pt wanted it sent to the [facility] in [place], not the one in [place].      [place] [id number] - [place], [place] AT [address] [place], [place] [address] Phone: [phone]  Fax: [phone]

## 2023-03-01 MED ORDER — HYDROCODONE-ACETAMINOPHEN 7.5-325 MG PO TABS: 1.0000 | ORAL_TABLET | Freq: Three times a day (TID) | ORAL | 0 refills | Status: DC | PRN

## 2023-03-01 MED ORDER — ALPRAZOLAM 0.25 MG PO TABS: 0.2500 mg | ORAL_TABLET | ORAL | 1 refills | Status: DC

## 2023-03-01 NOTE — Addendum Note (Signed)
Addended by: Lamar Blinks C on: 03/01/2023 12:54 PM   Modules accepted: Orders

## 2023-03-01 NOTE — Telephone Encounter (Signed)
Ok- changed rx

## 2023-03-01 NOTE — Telephone Encounter (Signed)
Please see below.

## 2023-03-27 ENCOUNTER — Encounter: Payer: Self-pay | Admitting: *Deleted

## 2023-03-31 ENCOUNTER — Telehealth: Payer: Self-pay | Admitting: Family Medicine

## 2023-03-31 DIAGNOSIS — F411 Generalized anxiety disorder: Secondary | ICD-10-CM

## 2023-03-31 DIAGNOSIS — G8929 Other chronic pain: Secondary | ICD-10-CM

## 2023-03-31 DIAGNOSIS — F4321 Adjustment disorder with depressed mood: Secondary | ICD-10-CM

## 2023-03-31 NOTE — Telephone Encounter (Signed)
Prescription Request  03/31/2023  Is this a "Controlled Substance" medicine? Yes  LOV: Visit date not found  What is the name of the medication or equipment?   Have you contacted your pharmacy to request a refill? No   Which pharmacy would you like this sent to?  [facility]  [address] [place], [place] [address] [phone]  Patient notified that their request is being sent to the clinical staff for review and that they should receive a response within 2 business days.   Please advise at Mobile [phone] (mobile)

## 2023-04-03 MED ORDER — HYDROCODONE-ACETAMINOPHEN 7.5-325 MG PO TABS: 1.0000 | ORAL_TABLET | Freq: Three times a day (TID) | ORAL | 0 refills | Status: DC | PRN

## 2023-04-03 MED ORDER — ALPRAZOLAM 0.25 MG PO TABS: 0.2500 mg | ORAL_TABLET | ORAL | 0 refills | Status: DC

## 2023-04-03 NOTE — Addendum Note (Signed)
Addended by: Pearline Cables on: 04/03/2023 04:37 PM   Modules accepted: Orders

## 2023-04-03 NOTE — Telephone Encounter (Signed)
Meds refilled- please give him a call, he is due for OV now please! Thank you

## 2023-04-03 NOTE — Telephone Encounter (Signed)
Assuming this is for the Alprazolam and Norco since they were last refilled 1 month ago.

## 2023-04-04 NOTE — Telephone Encounter (Signed)
Left vm to return call.    

## 2023-04-04 NOTE — Telephone Encounter (Signed)
Letter also mailed since pt did not read MyChart messsage that was sent last month.

## 2023-04-09 NOTE — Patient Instructions (Incomplete)
It was good to see you again today! Recommend shingles vaccine at your pharmacy, COVID booster if not done the last 9 months or so  Ok to stop amlodipine - your BP is on the low side I will be in touch with your labs asap  Let's have you try trazodone for sleep- start with a 1/2 tablet and go to a whole as needed- let me know how this works for you  Let me know when you need more refills

## 2023-04-09 NOTE — Progress Notes (Unsigned)
[facility] at [facility] [address], [address] [place], [place] [address] [phone]  Date:  04/12/2023   Name:  Jonathan Harrington   DOB:  [birth year]   MRN:  [id number]  PCP:  Pearline Cables, MD    Chief Complaint: No chief complaint on file.   History of Present Illness:  Jonathan Harrington is a 67 y.o. very pleasant male patient who presents with the following:  Patient seen today for periodic follow-up Most recent visit with myself was in October  History of anxiety, diverticulitis, neuropathy, hypertension, chronic joint pain, heavy tobacco use, COPD, dyslipidemia He has neuropathy and lower limb claudication-much worse with standing or walking. He retired from his work last year which has made things easier.  He is treated with narcotics for chronic pain; I am prescribing hydrocodone 7.5, number 70 tablets monthly Gabapentin 300 mg 3 times daily He is also taking alprazolam for anxiety, 0.25 mg 45/month  He is followed by vascular surgery for his lower limb claudication-most recent visit with Dr. Myra Gianotti was in October of last year  Shingrix COVID-19 booster Cologuard Labs done in October UDS is up-to-date, completed in October  Offer lung cancer screening CT-most recent completed 3/22 Offer CT coronary calcium  Refilled hydrocodone on 4/22, alprazolam 4/22, gabapentin 4/3 Patient Active Problem List   Diagnosis Date Noted   Peripheral arterial disease (HCC) 06/07/2022   Anemia 06/07/2022   Closed fracture of right tibia and fibula 01/17/2022   Tobacco use 01/17/2022   Alcohol use    Chronic pain    Hyperlipidemia    COPD (chronic obstructive pulmonary disease) (HCC) 12/20/2020   Neuropathy 06/04/2019   Alcoholism (HCC) 11/19/2018   Essential hypertension 06/06/2016   Smoker 05/12/2016   Left inguinal hernia 05/23/2014   Anxiety 09/17/2013   Gout 01/17/2013   DIVERTICULOSIS-COLON 08/13/2010   DIVERTICULITIS, COLON  08/13/2010   ABDOMINAL PAIN-LLQ 08/13/2010   Nonspecific (abnormal) findings on radiological and other examination of body structure 08/13/2010   PERSONAL HX COLONIC POLYPS 08/13/2010   NONSPCIFC ABN FINDING RAD & OTH EXAM LUNG FIELD 08/13/2010    Past Medical History:  Diagnosis Date   Alcoholism (HCC)    Allergy    Anxiety    Arthritis    COPD (chronic obstructive pulmonary disease) (HCC)    Diverticulitis 2011   resulted in partial colectomy   Dyspnea    Essential hypertension 06/06/2016   Full dentures    Gout    Hematuria    History of kidney stones    Wears glasses     Past Surgical History:  Procedure Laterality Date   ABDOMINAL AORTOGRAM W/LOWER EXTREMITY Bilateral 07/19/2019   Procedure: ABDOMINAL AORTOGRAM W/LOWER EXTREMITY;  Surgeon: Sherren Kerns, MD;  Location: [facility];  Service: Cardiovascular;  Laterality: Bilateral;   ABDOMINAL AORTOGRAM W/LOWER EXTREMITY Bilateral 06/21/2022   Procedure: ABDOMINAL AORTOGRAM W/LOWER EXTREMITY;  Surgeon: Nada Libman, MD;  Location: [facility];  Service: Cardiovascular;  Laterality: Bilateral;   COLON SURGERY  2011   partial colectomy   COLONOSCOPY     FINGER ARTHROPLASTY  1990   rt index fx   HERNIA REPAIR     INGUINAL HERNIA REPAIR Left 05/27/2014   Procedure: LEFT INGUINAL HERNIA REPAIR WITH MESH;  Surgeon: Wilmon Arms. Corliss Skains, MD;  Location: [facility];  Service: General;  Laterality: Left;   INSERTION OF MESH Left 05/27/2014   Procedure:  INSERTION OF MESH;  Surgeon: Wilmon Arms. Corliss Skains, MD;  Location: [facility];  Service: General;  Laterality: Left;   TIBIA IM NAIL INSERTION Right 01/18/2022   Procedure: INTRAMEDULLARY (IM) NAIL TIBIAL;  Surgeon: Terance Hart, MD;  Location: [facility] OR;  Service: Orthopedics;  Laterality: Right;    Social History   Tobacco Use   Smoking status: Every Day    Packs/day: 2    Types: Cigarettes   Smokeless tobacco: Never  Vaping Use    Vaping Use: Never used  Substance Use Topics   Alcohol use: Yes    Alcohol/week: 21.0 standard drinks of alcohol    Types: 21 Cans of beer per week    Comment: about 3 beers daily- history of heavy liquor abuse   Drug use: No    Family History  Problem Relation Age of Onset   Alzheimer's disease Mother    Cancer Sister    Neuropathy Neg Hx     Allergies  Allergen Reactions   Ibuprofen Swelling and Other (See Comments)    Hands and face swell- had to seek medical treatment.  No issue with celecoxib    Medication list has been reviewed and updated.  Current Outpatient Medications on File Prior to Visit  Medication Sig Dispense Refill   allopurinol (ZYLOPRIM) 300 MG tablet TAKE 1 TABLET(300 MG) BY MOUTH DAILY 90 tablet 3   ALPRAZolam (XANAX) 0.25 MG tablet Take 1 tablet (0.25 mg total) by mouth See admin instructions. Take 0.25 mg by mouth in the morning and an additional 0.25 mg during the day or in the evening as needed for anxiety 60 tablet 0   amLODipine (NORVASC) 5 MG tablet Take 1 tablet (5 mg total) by mouth daily. 90 tablet 1   Ascorbic Acid 1000 MG TBCR Take 1,000 mg by mouth daily.     Cholecalciferol (VITAMIN D3) 25 MCG (1000 UT) CAPS Take 1,000 Units by mouth daily.     gabapentin (NEURONTIN) 300 MG capsule TAKE 3 CAPSULES(900 MG) BY MOUTH THREE TIMES DAILY 270 capsule 1   HYDROcodone-acetaminophen (NORCO) 7.5-325 MG tablet Take 1 tablet by mouth every 8 (eight) hours as needed for moderate pain. This is a 30 day supply- must last for 30 days 70 tablet 0   Melatonin 10 MG TABS Take 10 mg by mouth at bedtime.     rosuvastatin (CRESTOR) 20 MG tablet TAKE 1 TABLET(20 MG) BY MOUTH DAILY 90 tablet 3   sildenafil (VIAGRA) 100 MG tablet Take 100 mg by mouth daily as needed for erectile dysfunction.     sulfamethoxazole-trimethoprim (BACTRIM DS) 800-160 MG tablet TAKE 1 TABLET BY MOUTH TWICE DAILY 20 tablet 0   Testosterone 1.62 % GEL Place 1 Pump onto the skin in the morning.      triamcinolone cream (KENALOG) 0.1 % Apply 1 application topically 2 (two) times daily as needed (eczema on leg). (Patient taking differently: Apply 1 application  topically 2 (two) times daily as needed (eczema on legs and ankles).) 90 g 2   vitamin B-12 (CYANOCOBALAMIN) 1000 MCG tablet Take 1,000 mcg by mouth daily.     No current facility-administered medications on file prior to visit.    Review of Systems:  As per HPI- otherwise negative.   Physical Examination: There were no vitals filed for this visit. There were no vitals filed for this visit. There is no height or weight on file to calculate BMI. Ideal Body Weight:    ***  Assessment and  Plan: ***  Signed Lamar Blinks, MD

## 2023-04-12 ENCOUNTER — Ambulatory Visit: Payer: Medicare PPO | Admitting: Family Medicine

## 2023-04-12 ENCOUNTER — Encounter: Payer: Self-pay | Admitting: Family Medicine

## 2023-04-12 VITALS — BP 112/62 | HR 101 | Temp 98.4°F | Resp 18 | Ht 72.0 in | Wt 158.2 lb

## 2023-04-12 DIAGNOSIS — G8929 Other chronic pain: Secondary | ICD-10-CM | POA: Diagnosis not present

## 2023-04-12 DIAGNOSIS — I10 Essential (primary) hypertension: Secondary | ICD-10-CM | POA: Diagnosis not present

## 2023-04-12 DIAGNOSIS — F5101 Primary insomnia: Secondary | ICD-10-CM

## 2023-04-12 DIAGNOSIS — Z1211 Encounter for screening for malignant neoplasm of colon: Secondary | ICD-10-CM

## 2023-04-12 DIAGNOSIS — E785 Hyperlipidemia, unspecified: Secondary | ICD-10-CM

## 2023-04-12 DIAGNOSIS — Z125 Encounter for screening for malignant neoplasm of prostate: Secondary | ICD-10-CM

## 2023-04-12 DIAGNOSIS — M545 Low back pain, unspecified: Secondary | ICD-10-CM | POA: Diagnosis not present

## 2023-04-12 DIAGNOSIS — Z122 Encounter for screening for malignant neoplasm of respiratory organs: Secondary | ICD-10-CM

## 2023-04-12 DIAGNOSIS — F411 Generalized anxiety disorder: Secondary | ICD-10-CM

## 2023-04-12 DIAGNOSIS — J449 Chronic obstructive pulmonary disease, unspecified: Secondary | ICD-10-CM

## 2023-04-12 LAB — CBC
HCT: 39.2 % (ref 39.0–52.0)
Hemoglobin: 13.3 g/dL (ref 13.0–17.0)
MCHC: 34 g/dL (ref 30.0–36.0)
MCV: 105.8 fl — ABNORMAL HIGH (ref 78.0–100.0)
Platelets: 186 10*3/uL (ref 150.0–400.0)
RBC: 3.71 Mil/uL — ABNORMAL LOW (ref 4.22–5.81)
RDW: 14 % (ref 11.5–15.5)
WBC: 6.3 10*3/uL (ref 4.0–10.5)

## 2023-04-12 LAB — COMPREHENSIVE METABOLIC PANEL
ALT: 15 U/L (ref 0–53)
AST: 24 U/L (ref 0–37)
Albumin: 4.1 g/dL (ref 3.5–5.2)
Alkaline Phosphatase: 60 U/L (ref 39–117)
BUN: 18 mg/dL (ref 6–23)
CO2: 25 mEq/L (ref 19–32)
Calcium: 9.4 mg/dL (ref 8.4–10.5)
Chloride: 102 mEq/L (ref 96–112)
Creatinine, Ser: 1.5 mg/dL (ref 0.40–1.50)
GFR: 47.35 mL/min — ABNORMAL LOW (ref >60.00)
Glucose, Bld: 105 mg/dL — ABNORMAL HIGH (ref 70–99)
Potassium: 5.2 mEq/L — ABNORMAL HIGH (ref 3.5–5.1)
Sodium: 135 mEq/L (ref 135–145)
Total Bilirubin: 0.5 mg/dL (ref 0.2–1.2)
Total Protein: 7.1 g/dL (ref 6.0–8.3)

## 2023-04-12 LAB — PSA: PSA: 0.61 ng/mL (ref 0.10–4.00)

## 2023-04-12 MED ORDER — TRAZODONE HCL 50 MG PO TABS
25.0000 mg | ORAL_TABLET | Freq: Every evening | ORAL | 3 refills | Status: DC | PRN
Start: 2023-04-12 — End: 2023-08-07

## 2023-04-12 MED ORDER — ROSUVASTATIN CALCIUM 20 MG PO TABS
ORAL_TABLET | ORAL | 3 refills | Status: AC
Start: 2023-04-12 — End: ?

## 2023-04-26 ENCOUNTER — Telehealth (HOSPITAL_BASED_OUTPATIENT_CLINIC_OR_DEPARTMENT_OTHER): Payer: Self-pay

## 2023-05-01 ENCOUNTER — Other Ambulatory Visit: Payer: Self-pay | Admitting: *Deleted

## 2023-05-01 ENCOUNTER — Encounter: Payer: Self-pay | Admitting: Family Medicine

## 2023-05-01 ENCOUNTER — Other Ambulatory Visit: Payer: Self-pay | Admitting: Family Medicine

## 2023-05-01 DIAGNOSIS — L989 Disorder of the skin and subcutaneous tissue, unspecified: Secondary | ICD-10-CM

## 2023-05-01 DIAGNOSIS — F4321 Adjustment disorder with depressed mood: Secondary | ICD-10-CM

## 2023-05-01 DIAGNOSIS — F411 Generalized anxiety disorder: Secondary | ICD-10-CM

## 2023-05-01 MED ORDER — GABAPENTIN 300 MG PO CAPS: 900.0000 mg | ORAL_CAPSULE | Freq: Three times a day (TID) | ORAL | 0 refills | Status: DC

## 2023-05-02 DIAGNOSIS — Z6823 Body mass index (BMI) 23.0-23.9, adult: Secondary | ICD-10-CM | POA: Diagnosis not present

## 2023-05-02 DIAGNOSIS — M7918 Myalgia, other site: Secondary | ICD-10-CM | POA: Diagnosis not present

## 2023-05-05 ENCOUNTER — Telehealth (HOSPITAL_BASED_OUTPATIENT_CLINIC_OR_DEPARTMENT_OTHER): Payer: Self-pay

## 2023-05-09 ENCOUNTER — Telehealth: Payer: Self-pay | Admitting: Family Medicine

## 2023-05-09 DIAGNOSIS — M545 Low back pain, unspecified: Secondary | ICD-10-CM

## 2023-05-09 MED ORDER — HYDROCODONE-ACETAMINOPHEN 7.5-325 MG PO TABS
1.0000 | ORAL_TABLET | Freq: Three times a day (TID) | ORAL | 0 refills | Status: DC | PRN
Start: 2023-05-09 — End: 2023-06-30

## 2023-05-09 NOTE — Telephone Encounter (Signed)
Okay for rx

## 2023-05-09 NOTE — Telephone Encounter (Signed)
Prescription Request  05/09/2023  Is this a "Controlled Substance" medicine? Yes  LOV: 04/12/2023  What is the name of the medication or equipment?  HYDROcodone-acetaminophen (NORCO) 7.5-325 MG tablet   Have you contacted your pharmacy to request a refill? No   Which pharmacy would you like this sent to?  [facility] #[id number] - [name], [place] - [address] AT [facility] [address] [place] [address] Phone: [phone] Fax: [phone]    Patient notified that their request is being sent to the clinical staff for review and that they should receive a response within 2 business days.   Please advise at [place] [phone] (mobile)

## 2023-05-09 NOTE — Addendum Note (Signed)
Addended by: Abbe Amsterdam C on: 05/09/2023 04:28 PM   Modules accepted: Orders

## 2023-05-29 ENCOUNTER — Other Ambulatory Visit: Payer: Self-pay | Admitting: *Deleted

## 2023-05-29 MED ORDER — GABAPENTIN 300 MG PO CAPS: 900.0000 mg | ORAL_CAPSULE | Freq: Three times a day (TID) | ORAL | 0 refills | Status: DC

## 2023-06-06 ENCOUNTER — Ambulatory Visit: Payer: Medicare PPO | Admitting: Family Medicine

## 2023-06-08 ENCOUNTER — Telehealth: Payer: Self-pay | Admitting: Family Medicine

## 2023-06-08 DIAGNOSIS — F4321 Adjustment disorder with depressed mood: Secondary | ICD-10-CM

## 2023-06-08 DIAGNOSIS — F411 Generalized anxiety disorder: Secondary | ICD-10-CM

## 2023-06-08 MED ORDER — ALPRAZOLAM 0.25 MG PO TABS
ORAL_TABLET | ORAL | 2 refills | Status: DC
Start: 2023-06-08 — End: 2023-06-30

## 2023-06-08 NOTE — Telephone Encounter (Signed)
Prescription Request  06/08/2023  Is this a "Controlled Substance" medicine? No  LOV: 04/12/2023  What is the name of the medication or equipment? ALPRAZolam (XANAX) 0.25 MG tablet [[id number]]   Have you contacted your pharmacy to request a refill? No   Which pharmacy would you like this sent to?  [facility] #[id number] - [name], [place] - [address] AT [facility] OF [address] [place] [address] Phone: [phone] Fax: [phone]    Patient notified that their request is being sent to the clinical staff for review and that they should receive a response within 2 business days.   Please advise at Mobile [phone] (mobile) // Please call to advise when sent. Pt will be moving and wants to pick it up. Two pills left

## 2023-06-08 NOTE — Telephone Encounter (Signed)
Okay for refill?  

## 2023-06-08 NOTE — Addendum Note (Signed)
Addended by: Abbe Amsterdam C on: 06/08/2023 09:06 PM   Modules accepted: Orders

## 2023-06-11 ENCOUNTER — Other Ambulatory Visit: Payer: Self-pay | Admitting: Family Medicine

## 2023-06-11 DIAGNOSIS — I10 Essential (primary) hypertension: Secondary | ICD-10-CM

## 2023-06-20 ENCOUNTER — Ambulatory Visit: Payer: Medicare PPO | Admitting: Sports Medicine

## 2023-06-28 ENCOUNTER — Telehealth: Payer: Self-pay | Admitting: Family Medicine

## 2023-06-28 DIAGNOSIS — F411 Generalized anxiety disorder: Secondary | ICD-10-CM

## 2023-06-28 DIAGNOSIS — F4321 Adjustment disorder with depressed mood: Secondary | ICD-10-CM

## 2023-06-28 DIAGNOSIS — G8929 Other chronic pain: Secondary | ICD-10-CM

## 2023-06-28 NOTE — Telephone Encounter (Signed)
Pt states HYDROcodone-acetaminophen (NORCO) 7.5-325 MG tablet is not helping and he would like pcp to increase his dosage. He states his arthritis is getting worse and has been taking 1.5 of the tablets to help.

## 2023-06-29 NOTE — Telephone Encounter (Signed)
Please advise 

## 2023-06-29 NOTE — Telephone Encounter (Signed)
Would you be okay with a VV?

## 2023-06-30 MED ORDER — HYDROCODONE-ACETAMINOPHEN 7.5-325 MG PO TABS
1.0000 | ORAL_TABLET | Freq: Three times a day (TID) | ORAL | 0 refills | Status: DC | PRN
Start: 2023-06-30 — End: 2023-07-31

## 2023-06-30 MED ORDER — ALPRAZOLAM 0.25 MG PO TABS
ORAL_TABLET | ORAL | 2 refills | Status: AC
Start: 2023-06-30 — End: ?

## 2023-06-30 NOTE — Addendum Note (Signed)
Addended by: Abbe Amsterdam C on: 06/30/2023 02:50 PM   Modules accepted: Orders

## 2023-06-30 NOTE — Telephone Encounter (Signed)
Pt called and scheduled appt for 8/8. He did ask for refill on xanax & Hydrocodone 7.5 until he could discuss increase on 8/8. [facility] [place].

## 2023-07-05 ENCOUNTER — Ambulatory Visit: Payer: Medicare PPO | Admitting: Sports Medicine

## 2023-07-05 VITALS — BP 104/58 | Ht 71.0 in | Wt 155.0 lb

## 2023-07-05 DIAGNOSIS — G629 Polyneuropathy, unspecified: Secondary | ICD-10-CM

## 2023-07-05 MED ORDER — GABAPENTIN 300 MG PO CAPS: 900.0000 mg | ORAL_CAPSULE | Freq: Three times a day (TID) | ORAL | 3 refills | Status: DC

## 2023-07-05 NOTE — Progress Notes (Signed)
   Subjective:    Patient ID: ABDULKARIM EBERLIN, male    DOB: Aug 01, 1956, 67 y.o.   MRN: [id number]  HPI chief complaint: Gabapentin refill  Patient is a very pleasant 67 year old male with a history of bilateral lower extremity neuropathy who comes in today requesting a refill for his gabapentin.  He has been on gabapentin chronically for several years.  It really helps his neuropathic symptoms.  He was most recently seeing Dr. Jordan Likes in [facility].  Dr. Jordan Likes has since left [place].  Patient currently takes 900 mg 3 times a day and tolerates this well.  Past medical history reviewed Medications reviewed Allergies reviewed    Review of Systems As above    Objective:   Physical Exam  Well-developed, well-nourished.  No acute distress  Examination of both lower extremities shows decreased sensation to light touch diffusely throughout both feet and ankles.  Sensation is intact proximal to the ankle.  There is a loss of lower extremity hair but good dorsalis pedis and posterior tibial pulses bilaterally.  No obvious wounds.      Assessment & Plan:   Chronic lower extremity neuropathy  I will refill the patient's gabapentin with instructions to continue at 900 mg 3 times a day.  I will give him 2 additional refills as well.  This medicine is obviously helping him quite a bit.  He may call the office for additional refills as needed.  Follow-up again in 1 year or sooner if needed.  This note was dictated using Dragon naturally speaking software and may contain errors in syntax, spelling, or content which have not been identified prior to signing this note.

## 2023-07-20 ENCOUNTER — Ambulatory Visit: Payer: Medicare PPO | Admitting: Family Medicine

## 2023-07-28 NOTE — Progress Notes (Addendum)
[facility] [address], [address] [place], [place] [address] [phone]  Date:  07/31/2023   Name:  Jonathan Harrington   DOB:  [birth year]   MRN:  [id number]  PCP:  Pearline Cables, MD    Chief Complaint: Pain Management (Pt would like to increase dose of Norco/Concerns/ questions: pt asks if he can have all labs collected today that he is due for since he lives so far away. )   History of Present Illness:  Jonathan Harrington is a 67 y.o. very pleasant male patient who presents with the following:  Pt seen today to discuss possible change of his pain management regimen Last seen by myself in May - from our last visit:  History of anxiety, diverticulitis, neuropathy, hypertension, chronic joint pain, heavy tobacco use, COPD, dyslipidemia He has neuropathy and lower limb claudication-much worse with standing or walking. He retired from his work last year which has made things easier.  He is treated with narcotics for chronic pain; I am prescribing hydrocodone 7.5, number 70 tablets monthly Gabapentin 300 mg 3 times daily He is also taking alprazolam for anxiety, 0.25 mg 45/month He is followed by vascular surgery for his lower limb claudication-most recent visit with Dr. 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  He does note his hip and  back are more painful now than they were previously IMPRESSION: 1. Diffuse multilevel degenerative change lumbar spine. Stable mild L2 compression fracture. 2. Avascular necrosis of the right femoral head cannot be excluded . 3.  Right nephrolithiasis cannot be excluded. 4.  Aortic atherosclerosis.  Cologuard due-patient admits he misplaced his previous kit, I will send him a new Cologuard Recommend covid and flu this fall Shingrix   He declines a lung cancer screening CT right now Patient Active Problem List   Diagnosis Date Noted   Peripheral arterial disease (HCC) 06/07/2022   Anemia 06/07/2022   Closed fracture of right tibia and fibula 01/17/2022   Tobacco use 01/17/2022   Alcohol use    Chronic pain    Hyperlipidemia    COPD (chronic obstructive pulmonary disease) (HCC) 12/20/2020   Neuropathy 06/04/2019   Alcoholism (HCC) 11/19/2018   Essential hypertension 06/06/2016   Smoker 05/12/2016   Left inguinal hernia 05/23/2014   Anxiety 09/17/2013   Gout 01/17/2013   DIVERTICULOSIS-COLON 08/13/2010   DIVERTICULITIS, COLON 08/13/2010   ABDOMINAL PAIN-LLQ 08/13/2010   Nonspecific (abnormal) findings on radiological and other examination of body structure 08/13/2010   PERSONAL HX COLONIC POLYPS 08/13/2010   NONSPCIFC ABN FINDING RAD & OTH EXAM LUNG FIELD 08/13/2010    Past Medical History:  Diagnosis Date   Alcoholism (HCC)    Allergy    Anxiety    Arthritis    COPD (chronic obstructive pulmonary disease) (HCC)    Diverticulitis 2011  resulted in partial colectomy   Dyspnea    Essential hypertension 06/06/2016   Full dentures    Gout    Hematuria    History of kidney stones    Wears glasses     Past Surgical History:  Procedure Laterality Date   ABDOMINAL AORTOGRAM W/LOWER EXTREMITY Bilateral 07/19/2019   Procedure: ABDOMINAL AORTOGRAM W/LOWER EXTREMITY;  Surgeon: Sherren Kerns, MD;  [facility];  Service: Cardiovascular;  Laterality:  Bilateral;   ABDOMINAL AORTOGRAM W/LOWER EXTREMITY Bilateral 06/21/2022   Procedure: ABDOMINAL AORTOGRAM W/LOWER EXTREMITY;  Surgeon: Nada Libman, MD;  Location: [facility];  Service: Cardiovascular;  Laterality: Bilateral;   COLON SURGERY  2011   partial colectomy   COLONOSCOPY     FINGER ARTHROPLASTY  1990   rt index fx   HERNIA REPAIR     INGUINAL HERNIA REPAIR Left 05/27/2014   Procedure: LEFT INGUINAL HERNIA REPAIR WITH MESH;  Surgeon: Wilmon Arms. Corliss Skains, MD;  Location: [facility];  Service: General;  Laterality: Left;   INSERTION OF MESH Left 05/27/2014   Procedure: INSERTION OF MESH;  Surgeon: Wilmon Arms. Corliss Skains, MD;  Location: [facility];  Service: General;  Laterality: Left;   TIBIA IM NAIL INSERTION Right 01/18/2022   Procedure: INTRAMEDULLARY (IM) NAIL TIBIAL;  Surgeon: Terance Hart, MD;  Location: [facility];  Service: Orthopedics;  Laterality: Right;    Social History   Tobacco Use   Smoking status: Every Day    Current packs/day: 2.00    Types: Cigarettes   Smokeless tobacco: Never  Vaping Use   Vaping status: Never Used  Substance Use Topics   Alcohol use: Yes    Alcohol/week: 21.0 standard drinks of alcohol    Types: 21 Cans of beer per week    Comment: about 3 beers daily- history of heavy liquor abuse   Drug use: No    Family History  Problem Relation Age of Onset   Alzheimer's disease Mother    Cancer Sister    Neuropathy Neg Hx     Allergies  Allergen Reactions   Ibuprofen Swelling and Other (See Comments)    Hands and face swell- had to seek medical treatment.  No issue with celecoxib    Medication list has been reviewed and updated.  Current Outpatient Medications on File Prior to Visit  Medication Sig Dispense Refill   allopurinol (ZYLOPRIM) 300 MG tablet TAKE 1 TABLET(300 MG) BY MOUTH DAILY 90 tablet 3   ALPRAZolam (XANAX) 0.25 MG tablet TAKE 1 TABLET BY MOUTH IN THE MORNING AND AN ADDITONAL TABLET  DURING THE DAY OR EVENING AS NEEDED FOR ANXIETY 45 tablet 2   Ascorbic Acid 1000 MG TBCR Take 1,000 mg by mouth daily.     Cholecalciferol (VITAMIN D3) 25 MCG (1000 UT) CAPS Take 1,000 Units by mouth daily.     gabapentin (NEURONTIN) 300 MG capsule Take 3 capsules (900 mg total) by mouth 3 (three) times daily. 270 capsule 3   Melatonin 10 MG TABS Take 10 mg by mouth at bedtime.     rosuvastatin (CRESTOR) 20 MG tablet TAKE 1 TABLET(20 MG) BY MOUTH DAILY 90 tablet 3   sildenafil (VIAGRA) 100 MG tablet Take 100 mg by mouth daily as needed for erectile dysfunction.     traZODone (DESYREL) 50 MG tablet Take 0.5-1 tablets (25-50 mg total) by mouth at bedtime as needed for sleep. 30 tablet 3   triamcinolone cream (KENALOG) 0.1 %  Apply 1 Application topically 2 (two) times daily as needed (eczema on legs and ankles). 90 g 2   vitamin B-12 (CYANOCOBALAMIN) 1000 MCG tablet Take 1,000 mcg by mouth daily.     No current facility-administered medications on file prior to visit.    Review of Systems:  As per HPI- otherwise negative.   Physical Examination: Vitals:   07/31/23 1333  BP: 122/64  Pulse: 64  Resp: 18  Temp: 97.8 F (36.6 C)  SpO2: 99%   Vitals:   07/31/23 1333  Weight: 157 lb (71.2 kg)  Height: 6' (1.829 m)   Body mass index is 21.29 kg/m. Ideal Body Weight: Weight in (lb) to have BMI = 25: 183.9  GEN: no acute distress.  Normal weight, appears somewhat chronically debilitated HEENT: Atraumatic, Normocephalic.  Ears and Nose: No external deformity. CV: RRR, No M/G/R. No JVD. No thrill. No extra heart sounds. PULM: CTA B, no wheezes, crackles, rhonchi. No retractions. No resp. distress. No accessory muscle use. ABD: S, NT, ND, +BS. No rebound. No HSM. EXTR: No c/c/e PSYCH: Normally interactive. Conversant.    Assessment and Plan: GAD (generalized anxiety disorder)  Essential hypertension - Plan: CBC, Comprehensive metabolic panel  Dyslipidemia - Plan: Lipid  panel  Screening for lung cancer - Plan: CT CHEST LUNG CA SCREEN LOW DOSE W/O CM  Chronic renal impairment, unspecified CKD stage  Medication monitoring encounter - Plan: DRUG MONITORING, PANEL 8 WITH CONFIRMATION, URINE  Screening for diabetes mellitus - Plan: Hemoglobin A1c  Right hip pain - Plan: DG Hip Unilat W OR W/O Pelvis 2-3 Views Right  Other chronic pain - Plan: HYDROcodone-acetaminophen (NORCO) 10-325 MG tablet  Neuropathy - Plan: HYDROcodone-acetaminophen (NORCO) 10-325 MG tablet  Peripheral arterial disease (HCC) - Plan: HYDROcodone-acetaminophen (NORCO) 10-325 MG tablet  Colon cancer screening - Plan: Cologuard  Patient seen today for follow-up.  Ordered lung cancer screening CT, ordered follow-up films of his right hip.  However, patient ended up not having these done in left clinic.  He notes he lives 2 hours away from the clinic and is not aware of any imaging facilities closer to his home  We decided to increase his dose of hydrocodone to 10 mg, 75 pills/month in hopes of better pain control Continue gabapentin  Ordered Cologuard  Will plan further follow- up pending labs.   Signed Abbe Amsterdam, MD  Addendum 8/26, received labs as below.  Gave patient a call Kidney function is stable Increased MCV suggestive of increased alcohol He did not answer- LMOM and will send MyChart message  Results for orders placed or performed in visit on 07/31/23  DRUG MONITORING, PANEL 8 WITH CONFIRMATION, URINE  Result Value Ref Range   Alcohol Metabolites POSITIVE (A) <500 ng/mL   Ethyl Glucuronide (ETG) 30,278 (H) <500 ng/mL   Ethyl Sulfate (ETS) 10,211 (H) <100 ng/mL   Alcohol Metab Comments     Amphetamines NEGATIVE <500 ng/mL   Benzodiazepines POSITIVE (A) <100 ng/mL   Alphahydroxyalprazolam 42 (H) <25 ng/mL   Alphahydroxymidazolam NEGATIVE <50 ng/mL   Alphahydroxytriazolam NEGATIVE <50 ng/mL   Aminoclonazepam NEGATIVE <25 ng/mL   Hydroxyethylflurazepam  NEGATIVE <50 ng/mL   Lorazepam NEGATIVE <50 ng/mL   Nordiazepam NEGATIVE <50 ng/mL   Oxazepam NEGATIVE <50 ng/mL   Temazepam NEGATIVE <50 ng/mL   Benzodiazepines Comments     Buprenorphine, Urine NEGATIVE <5 ng/mL   Cocaine Metabolite NEGATIVE <150 ng/mL   6 Acetylmorphine NEGATIVE <10 ng/mL   Marijuana Metabolite NEGATIVE <20 ng/mL  MDMA NEGATIVE <500 ng/mL   Opiates POSITIVE (A) <100 ng/mL   Codeine NEGATIVE <50 ng/mL   Hydrocodone 423 (H) <50 ng/mL   Hydromorphone 104 (H) <50 ng/mL   Morphine NEGATIVE <50 ng/mL   Norhydrocodone 407 (H) <50 ng/mL   Opiates Comments     Oxycodone NEGATIVE <100 ng/mL   Creatinine 72.4 > or = 20.0 mg/dL   pH 5.9 4.5 - 9.0   Oxidant NEGATIVE <200 mcg/mL  CBC  Result Value Ref Range   WBC 4.9 4.0 - 10.5 K/uL   RBC 3.45 (L) 4.22 - 5.81 Mil/uL   Platelets 140.0 (L) 150.0 - 400.0 K/uL   Hemoglobin 12.2 (L) 13.0 - 17.0 g/dL   HCT 16.1 (L) 09.6 - 04.5 %   MCV 106.3 (H) 78.0 - 100.0 fl   MCHC 33.3 30.0 - 36.0 g/dL   RDW 40.9 81.1 - 91.4 %  Comprehensive metabolic panel  Result Value Ref Range   Sodium 137 135 - 145 mEq/L   Potassium 5.1 3.5 - 5.1 mEq/L   Chloride 105 96 - 112 mEq/L   CO2 25 19 - 32 mEq/L   Glucose, Bld 101 (H) 70 - 99 mg/dL   BUN 19 6 - 23 mg/dL   Creatinine, Ser 7.82 0.40 - 1.50 mg/dL   Total Bilirubin 0.4 0.2 - 1.2 mg/dL   Alkaline Phosphatase 64 39 - 117 U/L   AST 25 0 - 37 U/L   ALT 15 0 - 53 U/L   Total Protein 7.1 6.0 - 8.3 g/dL   Albumin 4.1 3.5 - 5.2 g/dL   GFR 95.62 (L) >13.08 mL/min   Calcium 9.3 8.4 - 10.5 mg/dL  Hemoglobin M5H  Result Value Ref Range   Hgb A1c MFr Bld 5.2 4.6 - 6.5 %  Lipid panel  Result Value Ref Range   Cholesterol 120 0 - 200 mg/dL   Triglycerides 84.6 0.0 - 149.0 mg/dL   HDL 96.29 >52.84 mg/dL   VLDL 13.2 0.0 - 44.0 mg/dL   LDL Cholesterol 57 0 - 99 mg/dL   Total CHOL/HDL Ratio 3    NonHDL 74.41   DM TEMPLATE  Result Value Ref Range   Notes and Comments

## 2023-07-31 ENCOUNTER — Ambulatory Visit (HOSPITAL_BASED_OUTPATIENT_CLINIC_OR_DEPARTMENT_OTHER): Payer: Medicare PPO

## 2023-07-31 ENCOUNTER — Ambulatory Visit (INDEPENDENT_AMBULATORY_CARE_PROVIDER_SITE_OTHER): Payer: Medicare PPO | Admitting: Family Medicine

## 2023-07-31 VITALS — BP 122/64 | HR 64 | Temp 97.8°F | Resp 18 | Ht 72.0 in | Wt 157.0 lb

## 2023-07-31 DIAGNOSIS — I739 Peripheral vascular disease, unspecified: Secondary | ICD-10-CM

## 2023-07-31 DIAGNOSIS — I10 Essential (primary) hypertension: Secondary | ICD-10-CM | POA: Diagnosis not present

## 2023-07-31 DIAGNOSIS — Z131 Encounter for screening for diabetes mellitus: Secondary | ICD-10-CM | POA: Diagnosis not present

## 2023-07-31 DIAGNOSIS — Z1211 Encounter for screening for malignant neoplasm of colon: Secondary | ICD-10-CM

## 2023-07-31 DIAGNOSIS — G629 Polyneuropathy, unspecified: Secondary | ICD-10-CM

## 2023-07-31 DIAGNOSIS — F411 Generalized anxiety disorder: Secondary | ICD-10-CM | POA: Diagnosis not present

## 2023-07-31 DIAGNOSIS — Z5181 Encounter for therapeutic drug level monitoring: Secondary | ICD-10-CM

## 2023-07-31 DIAGNOSIS — G8929 Other chronic pain: Secondary | ICD-10-CM

## 2023-07-31 DIAGNOSIS — E785 Hyperlipidemia, unspecified: Secondary | ICD-10-CM

## 2023-07-31 DIAGNOSIS — Z122 Encounter for screening for malignant neoplasm of respiratory organs: Secondary | ICD-10-CM

## 2023-07-31 DIAGNOSIS — N189 Chronic kidney disease, unspecified: Secondary | ICD-10-CM | POA: Diagnosis not present

## 2023-07-31 DIAGNOSIS — M25551 Pain in right hip: Secondary | ICD-10-CM | POA: Diagnosis not present

## 2023-07-31 MED ORDER — HYDROCODONE-ACETAMINOPHEN 10-325 MG PO TABS
1.0000 | ORAL_TABLET | Freq: Three times a day (TID) | ORAL | 0 refills | Status: AC | PRN
Start: 2023-07-31 — End: ?

## 2023-07-31 NOTE — Patient Instructions (Signed)
Good to see you today!  I will be in touch with your labs Please stop by imaging on the ground floor to do your hip films and ask about the lung cancer screening Ct I increased your dosage of hydrocodone to 10 mg; you have enough to take 2.5 pills per day Watch for any increase in dizziness or sedation !  Your blood pressure and pulse do suggest that you need more fluid and salt to increase your BP a bit.  Let me know if your dizziness upon standing gets worse with increase in hydrocodone dosage

## 2023-08-01 LAB — COMPREHENSIVE METABOLIC PANEL
ALT: 15 U/L (ref 0–53)
AST: 25 U/L (ref 0–37)
Albumin: 4.1 g/dL (ref 3.5–5.2)
Alkaline Phosphatase: 64 U/L (ref 39–117)
BUN: 19 mg/dL (ref 6–23)
CO2: 25 meq/L (ref 19–32)
Calcium: 9.3 mg/dL (ref 8.4–10.5)
Chloride: 105 meq/L (ref 96–112)
Creatinine, Ser: 1.3 mg/dL (ref 0.40–1.50)
GFR: 56.1 mL/min — ABNORMAL LOW (ref >60.00)
Glucose, Bld: 101 mg/dL — ABNORMAL HIGH (ref 70–99)
Potassium: 5.1 meq/L (ref 3.5–5.1)
Sodium: 137 meq/L (ref 135–145)
Total Bilirubin: 0.4 mg/dL (ref 0.2–1.2)
Total Protein: 7.1 g/dL (ref 6.0–8.3)

## 2023-08-01 LAB — CBC
HCT: 36.7 % — ABNORMAL LOW (ref 39.0–52.0)
Hemoglobin: 12.2 g/dL — ABNORMAL LOW (ref 13.0–17.0)
MCHC: 33.3 g/dL (ref 30.0–36.0)
MCV: 106.3 fl — ABNORMAL HIGH (ref 78.0–100.0)
Platelets: 140 10*3/uL — ABNORMAL LOW (ref 150.0–400.0)
RBC: 3.45 Mil/uL — ABNORMAL LOW (ref 4.22–5.81)
RDW: 14 % (ref 11.5–15.5)
WBC: 4.9 10*3/uL (ref 4.0–10.5)

## 2023-08-01 LAB — LIPID PANEL
Cholesterol: 120 mg/dL (ref 0–200)
HDL: 45.2 mg/dL (ref >39.00)
LDL Cholesterol: 57 mg/dL (ref 0–99)
NonHDL: 74.41
Total CHOL/HDL Ratio: 3
Triglycerides: 86 mg/dL (ref 0.0–149.0)
VLDL: 17.2 mg/dL (ref 0.0–40.0)

## 2023-08-01 LAB — HEMOGLOBIN A1C: Hgb A1c MFr Bld: 5.2 % (ref 4.6–6.5)

## 2023-08-04 LAB — DRUG MONITORING, PANEL 8 WITH CONFIRMATION, URINE
6 Acetylmorphine: NEGATIVE ng/mL (ref <10)
Alcohol Metabolites: POSITIVE ng/mL — AB (ref <500)
Alphahydroxyalprazolam: 42 ng/mL — ABNORMAL HIGH (ref <25)
Alphahydroxymidazolam: NEGATIVE ng/mL (ref <50)
Alphahydroxytriazolam: NEGATIVE ng/mL (ref <50)
Aminoclonazepam: NEGATIVE ng/mL (ref <25)
Amphetamines: NEGATIVE ng/mL (ref <500)
Benzodiazepines: POSITIVE ng/mL — AB (ref <100)
Buprenorphine, Urine: NEGATIVE ng/mL (ref <5)
Cocaine Metabolite: NEGATIVE ng/mL (ref <150)
Codeine: NEGATIVE ng/mL (ref <50)
Creatinine: 72.4 mg/dL (ref >=20.0)
Ethyl Glucuronide (ETG): 30278 ng/mL — ABNORMAL HIGH (ref <500)
Ethyl Sulfate (ETS): 10211 ng/mL — ABNORMAL HIGH (ref <100)
Hydrocodone: 423 ng/mL — ABNORMAL HIGH (ref <50)
Hydromorphone: 104 ng/mL — ABNORMAL HIGH (ref <50)
Hydroxyethylflurazepam: NEGATIVE ng/mL (ref <50)
Lorazepam: NEGATIVE ng/mL (ref <50)
MDMA: NEGATIVE ng/mL (ref <500)
Marijuana Metabolite: NEGATIVE ng/mL (ref <20)
Morphine: NEGATIVE ng/mL (ref <50)
Nordiazepam: NEGATIVE ng/mL (ref <50)
Norhydrocodone: 407 ng/mL — ABNORMAL HIGH (ref <50)
Opiates: POSITIVE ng/mL — AB (ref <100)
Oxazepam: NEGATIVE ng/mL (ref <50)
Oxidant: NEGATIVE ug/mL (ref <200)
Oxycodone: NEGATIVE ng/mL (ref <100)
Temazepam: NEGATIVE ng/mL (ref <50)
pH: 5.9 (ref 4.5–9.0)

## 2023-08-04 LAB — DM TEMPLATE

## 2023-08-07 ENCOUNTER — Encounter: Payer: Self-pay | Admitting: Family Medicine

## 2023-08-07 ENCOUNTER — Other Ambulatory Visit: Payer: Self-pay | Admitting: Family Medicine

## 2023-08-07 DIAGNOSIS — F5101 Primary insomnia: Secondary | ICD-10-CM

## 2023-08-09 DIAGNOSIS — Z1211 Encounter for screening for malignant neoplasm of colon: Secondary | ICD-10-CM | POA: Diagnosis not present

## 2023-08-21 ENCOUNTER — Encounter: Payer: Self-pay | Admitting: Family Medicine

## 2023-08-21 LAB — COLOGUARD: COLOGUARD: NEGATIVE

## 2023-08-22 ENCOUNTER — Telehealth: Payer: Self-pay | Admitting: Family Medicine

## 2023-08-22 NOTE — Telephone Encounter (Signed)
Called patient to go over unread MyChart message from last month.  He did just complete his Cologuard and had a negative result which is good news  I advised him regarding evidence of excessive alcohol use.  Patient at first stated that he was not drinking much, then admitted he might drink 2, 3, 4, possibly even 5 beers per day.  I again advised him taking alcohol with his medications is not a safe practice and I would encourage him to gradually cease drinking altogether.  If he is not able to do this, I advised him to drink no more than 2 beers per day.  I have advised him previously and again today that combining alcohol with his controlled substances may cause excessive sedation which can be dangerous

## 2023-09-11 ENCOUNTER — Telehealth: Payer: Self-pay | Admitting: Family Medicine

## 2023-09-11 ENCOUNTER — Other Ambulatory Visit: Payer: Self-pay

## 2023-09-11 DIAGNOSIS — E785 Hyperlipidemia, unspecified: Secondary | ICD-10-CM

## 2023-09-11 DIAGNOSIS — F4321 Adjustment disorder with depressed mood: Secondary | ICD-10-CM

## 2023-09-11 DIAGNOSIS — F411 Generalized anxiety disorder: Secondary | ICD-10-CM

## 2023-09-11 NOTE — Telephone Encounter (Signed)
Do you remember if the pt requested this pharmacy? Back in March he asked that we remove that one because he wanted to use a pharmacy in [facility].

## 2023-09-11 NOTE — Telephone Encounter (Signed)
**  Pt requested a 60 days supply to be sent in for the xanex.**  Prescription Request  09/11/2023  Is this a "Controlled Substance" medicine? Yes  LOV: 07/31/2023  What is the name of the medication or equipment?   ALPRAZolam (XANAX) 0.25 MG tablet [[id number]]  rosuvastatin (CRESTOR) 20 MG tablet [[id number]]  Have you contacted your pharmacy to request a refill? No   Which pharmacy would you like this sent to?   [facility] [address] [place], [place], [place] [address] P: [phone]  Patient notified that their request is being sent to the clinical staff for review and that they should receive a response within 2 business days.   Please advise at Mobile [phone] (mobile)

## 2023-09-12 MED ORDER — ALPRAZOLAM 0.25 MG PO TABS
ORAL_TABLET | ORAL | 2 refills | Status: DC
Start: 2023-09-12 — End: 2024-01-25

## 2023-09-12 MED ORDER — ROSUVASTATIN CALCIUM 20 MG PO TABS
ORAL_TABLET | ORAL | 3 refills | Status: DC
Start: 2023-09-12 — End: 2024-05-23

## 2023-09-12 NOTE — Addendum Note (Signed)
Addended by: Abbe Amsterdam C on: 09/12/2023 11:34 AM   Modules accepted: Orders

## 2023-09-12 NOTE — Telephone Encounter (Signed)
Please see below: pt now wishes to switch pharmacies again.

## 2023-09-13 ENCOUNTER — Telehealth: Payer: Self-pay | Admitting: Family Medicine

## 2023-09-13 DIAGNOSIS — G629 Polyneuropathy, unspecified: Secondary | ICD-10-CM

## 2023-09-13 DIAGNOSIS — I739 Peripheral vascular disease, unspecified: Secondary | ICD-10-CM

## 2023-09-13 DIAGNOSIS — G8929 Other chronic pain: Secondary | ICD-10-CM

## 2023-09-13 MED ORDER — HYDROCODONE-ACETAMINOPHEN 10-325 MG PO TABS: 1.0000 | ORAL_TABLET | Freq: Three times a day (TID) | ORAL | 0 refills | Status: DC | PRN

## 2023-09-13 NOTE — Telephone Encounter (Signed)
Were you able to send this the other day with the Crestor as well?

## 2023-09-13 NOTE — Telephone Encounter (Signed)
Prescription Request  09/13/2023  Is this a "Controlled Substance" medicine? Yes  LOV: 07/31/2023  What is the name of the medication or equipment?   HYDROcodone-acetaminophen (NORCO) 10-325 MG tablet [[id number]]  Have you contacted your pharmacy to request a refill? No   Which pharmacy would you like this sent to?   [facility] #[id number] - [name], [place] - [address] AT [facility] OF [address] [place] [address] Phone: [phone] Fax: [phone]    Patient notified that their request is being sent to the clinical staff for review and that they should receive a response within 2 business days.   Please advise at Mobile [phone] (mobile)

## 2023-09-13 NOTE — Addendum Note (Signed)
Addended by: Abbe Amsterdam C on: 09/13/2023 05:02 PM   Modules accepted: Orders

## 2023-10-23 ENCOUNTER — Telehealth: Payer: Self-pay | Admitting: Family Medicine

## 2023-10-23 DIAGNOSIS — G629 Polyneuropathy, unspecified: Secondary | ICD-10-CM

## 2023-10-23 DIAGNOSIS — G8929 Other chronic pain: Secondary | ICD-10-CM

## 2023-10-23 DIAGNOSIS — I739 Peripheral vascular disease, unspecified: Secondary | ICD-10-CM

## 2023-10-23 MED ORDER — HYDROCODONE-ACETAMINOPHEN 10-325 MG PO TABS
1.0000 | ORAL_TABLET | Freq: Three times a day (TID) | ORAL | 0 refills | Status: DC | PRN
Start: 2023-10-23 — End: 2023-11-27

## 2023-10-23 NOTE — Telephone Encounter (Signed)
Prescription Request  10/23/2023  Is this a "Controlled Substance" medicine? Yes  LOV: 07/31/2023  What is the name of the medication or equipment?   HYDROcodone-acetaminophen (NORCO) 10-325 MG tablet [[id number]]  Have you contacted your pharmacy to request a refill? Yes   Which pharmacy would you like this sent to?   [facility] #[id number] - [name], [place] - [address] AT [facility] OF [address] [place] [address] Phone: [phone] Fax: [phone]    Patient notified that their request is being sent to the clinical staff for review and that they should receive a response within 2 business days.   Please advise at Mobile [phone] (mobile)

## 2023-10-23 NOTE — Addendum Note (Signed)
Addended by: Abbe Amsterdam C on: 10/23/2023 04:42 PM   Modules accepted: Orders

## 2023-10-27 ENCOUNTER — Telehealth: Payer: Self-pay | Admitting: Family Medicine

## 2023-10-27 ENCOUNTER — Encounter: Payer: Self-pay | Admitting: Family Medicine

## 2023-10-27 NOTE — Telephone Encounter (Signed)
Prescription Request  10/27/2023  Is this a "Controlled Substance" medicine? Yes  LOV: 07/31/2023  What is the name of the medication or equipment? ALPRAZolam (XANAX) 0.25 MG tablet  Have you contacted your pharmacy to request a refill? No   Which pharmacy would you like this sent to?    [facility] #[id number] - [name], [place] - [address] AT [facility] OF [address] [place] [address] Phone: [phone] Fax: [phone]    Patient notified that their request is being sent to the clinical staff for review and that they should receive a response within 2 business days.   Please advise at [facility] [phone]

## 2023-11-06 ENCOUNTER — Telehealth: Payer: Self-pay | Admitting: Family Medicine

## 2023-11-06 NOTE — Telephone Encounter (Signed)
Prescription Request  11/06/2023  Is this a "Controlled Substance" medicine? Yes  LOV: 07/31/2023  What is the name of the medication or equipment? ALPRAZolam (XANAX) 0.25 MG tablet  Have you contacted your pharmacy to request a refill? Yes   Which pharmacy would you like this sent to?   [facility] #[id number] - [name], [place] - [address] AT [facility] OF [address] [place] [address] Phone: [phone] Fax: [phone]    Patient notified that their request is being sent to the clinical staff for review and that they should receive a response within 2 business days.   Please advise at [facility] [phone]

## 2023-11-25 ENCOUNTER — Other Ambulatory Visit: Payer: Self-pay | Admitting: Family Medicine

## 2023-11-26 ENCOUNTER — Other Ambulatory Visit: Payer: Self-pay | Admitting: Family Medicine

## 2023-11-26 DIAGNOSIS — M1000 Idiopathic gout, unspecified site: Secondary | ICD-10-CM

## 2023-11-26 DIAGNOSIS — F5101 Primary insomnia: Secondary | ICD-10-CM

## 2023-11-27 ENCOUNTER — Telehealth: Payer: Self-pay | Admitting: Family Medicine

## 2023-11-27 DIAGNOSIS — I739 Peripheral vascular disease, unspecified: Secondary | ICD-10-CM

## 2023-11-27 DIAGNOSIS — G8929 Other chronic pain: Secondary | ICD-10-CM

## 2023-11-27 DIAGNOSIS — G629 Polyneuropathy, unspecified: Secondary | ICD-10-CM

## 2023-11-27 MED ORDER — HYDROCODONE-ACETAMINOPHEN 10-325 MG PO TABS: 1.0000 | ORAL_TABLET | Freq: Three times a day (TID) | ORAL | 0 refills | Status: DC | PRN

## 2023-11-27 NOTE — Telephone Encounter (Signed)
Prescription Request  11/27/2023  Is this a "Controlled Substance" medicine? No  LOV: 07/31/2023  What is the name of the medication or equipment? HYDROcodone-acetaminophen  Have you contacted your pharmacy to request a refill? Yes   Which pharmacy would you like this sent to?   [facility] #[id number] - [name],  - [address] AT [facility] OF [address] [place] [address] Phone: [phone] Fax: [phone]    Patient notified that their request is being sent to the clinical staff for review and that they should receive a response within 2 business days.   Please advise at [facility] [phone]

## 2023-11-27 NOTE — Telephone Encounter (Signed)
Okay fore refill?

## 2023-11-27 NOTE — Addendum Note (Signed)
Addended by: Abbe Amsterdam C on: 11/27/2023 03:05 PM   Modules accepted: Orders

## 2023-12-14 ENCOUNTER — Telehealth: Payer: Self-pay

## 2023-12-14 MED ORDER — ALBUTEROL SULFATE HFA 108 (90 BASE) MCG/ACT IN AERS: 2.0000 | INHALATION_SPRAY | Freq: Four times a day (QID) | RESPIRATORY_TRACT | 6 refills | Status: AC | PRN

## 2023-12-14 NOTE — Telephone Encounter (Signed)
 Copied from CRM [id number]. Topic: Clinical - Medication Question >> Dec 14, 2023  9:46 AM Maisie BROCKS wrote: Reason for CRM: Pt called and stated that he needs to resume taking his albuterol . He explained that he stopped taking it in 2023 but believes he needs to start back. He asked for telephone call at [phone] because he has issues with his MyChart. Please call and advise.

## 2023-12-24 ENCOUNTER — Other Ambulatory Visit: Payer: Self-pay | Admitting: Sports Medicine

## 2023-12-27 ENCOUNTER — Other Ambulatory Visit: Payer: Self-pay | Admitting: Family Medicine

## 2023-12-27 DIAGNOSIS — I739 Peripheral vascular disease, unspecified: Secondary | ICD-10-CM

## 2023-12-27 DIAGNOSIS — G629 Polyneuropathy, unspecified: Secondary | ICD-10-CM

## 2023-12-27 DIAGNOSIS — G8929 Other chronic pain: Secondary | ICD-10-CM

## 2023-12-27 MED ORDER — HYDROCODONE-ACETAMINOPHEN 10-325 MG PO TABS: 1.0000 | ORAL_TABLET | Freq: Three times a day (TID) | ORAL | 0 refills | Status: DC | PRN

## 2023-12-27 NOTE — Telephone Encounter (Signed)
 Requesting: Norco 10-325 Contract: N/A UDS: 07/31/2023 Last Visit: 07/31/2023 Next Visit: N/A Last Refill: 11/27/2023   Please Advise

## 2023-12-27 NOTE — Telephone Encounter (Signed)
 Copied from CRM [id number]. Topic: Clinical - Medication Refill >> Dec 27, 2023  9:37 AM Jayson Michael wrote: Most Recent Primary Care Visit:  Provider: Kaylee Partridge  Department: [facility]  Visit Type: OFFICE VISIT  Date: 07/31/2023  Medication: HYDROcodone -acetaminophen  (NORCO) 10-325 MG tablet  Has the patient contacted their pharmacy? Yes (Agent: If no, request that the patient contact the pharmacy for the refill. If patient does not wish to contact the pharmacy document the reason why and proceed with request.) (Agent: If yes, when and what did the pharmacy advise?)  Is this the correct pharmacy for this prescription? Yes If no, delete pharmacy and type the correct one.  This is the patient's preferred pharmacy:   [facility] #[id number] [facility] [place] - [address] AT [facility] OF [address] [place] [address] Phone: [phone] Fax: [phone]   Has the prescription been filled recently? Yes  Is the patient out of the medication? Yes  Has the patient been seen for an appointment in the last year OR does the patient have an upcoming appointment? Yes   Can we respond through MyChart? Yes  Agent: Please be advised that Rx refills may take up to 3 business days. We ask that you follow-up with your pharmacy.

## 2023-12-28 ENCOUNTER — Other Ambulatory Visit: Payer: Self-pay | Admitting: Family Medicine

## 2023-12-28 ENCOUNTER — Telehealth: Payer: Self-pay | Admitting: Family Medicine

## 2023-12-28 DIAGNOSIS — G629 Polyneuropathy, unspecified: Secondary | ICD-10-CM

## 2023-12-28 DIAGNOSIS — G8929 Other chronic pain: Secondary | ICD-10-CM

## 2023-12-28 DIAGNOSIS — I739 Peripheral vascular disease, unspecified: Secondary | ICD-10-CM

## 2023-12-28 DIAGNOSIS — F5101 Primary insomnia: Secondary | ICD-10-CM

## 2023-12-28 MED ORDER — HYDROCODONE-ACETAMINOPHEN 10-325 MG PO TABS: 1.0000 | ORAL_TABLET | Freq: Three times a day (TID) | ORAL | 0 refills | Status: DC | PRN

## 2023-12-28 MED ORDER — TRAZODONE HCL 50 MG PO TABS: 25.0000 mg | ORAL_TABLET | Freq: Every evening | ORAL | 3 refills | Status: DC | PRN

## 2023-12-28 NOTE — Telephone Encounter (Signed)
Copied from CRM [id number]. Topic: Clinical - Prescription Issue >> Dec 28, 2023 11:08 AM Fredrich Romans wrote: Reason for CRM: patient was suppose to have medication HYDROcodone-acetaminophen [facility]) 10-325  sent to [facility] #[id number] [facility], [place] - [address] AT [facility] OF [address] & [address]  Phone: [phone] Fax: [phone]  ,however it was sent to a different pharmacy

## 2023-12-28 NOTE — Addendum Note (Signed)
Addended by: Abbe Amsterdam C on: 12/28/2023 01:46 PM   Modules accepted: Orders

## 2023-12-28 NOTE — Telephone Encounter (Signed)
Copied from CRM [id number]. Topic: Clinical - Medication Refill >> Dec 28, 2023  2:03 PM Larwance Sachs wrote: Most Recent Primary Care Visit:  Provider: Pearline Cables  Department: [facility]  Visit Type: OFFICE VISIT  Date: 07/31/2023  Medication: gabapentin (NEURONTIN) 300 MG capsule traZODone (DESYREL) 50 MG tablet  Has the patient contacted their pharmacy? Yes, [facility] in [place] [facility] prescription for both medications  (Agent: If no, request that the patient contact the pharmacy for the refill. If patient does not wish to contact the pharmacy document the reason why and proceed with request.) (Agent: If yes, when and what did the pharmacy advise?)  Is this the correct pharmacy for this prescription? Yes If no, delete pharmacy and type the correct one.  This is the patient's preferred pharmacy:  [facility] #[id number] [facility] [place] - [address] AT [facility] OF [address] & [address] [place] [address] Phone: [phone] Fax: [phone]   Has the prescription been filled recently? No  Is the patient out of the medication? Yes  Has the patient been seen for an appointment in the last year OR does the patient have an upcoming appointment? Yes  Can we respond through MyChart? Yes  Agent: Please be advised that Rx refills may take up to 3 business days. We ask that you follow-up with your pharmacy.

## 2023-12-28 NOTE — Telephone Encounter (Signed)
Rx for trazodone resent. Gabapentin is handled by Dr. Margaretha Sheffield

## 2024-01-10 ENCOUNTER — Telehealth: Payer: Medicare PPO | Admitting: Sports Medicine

## 2024-01-10 DIAGNOSIS — G5793 Unspecified mononeuropathy of bilateral lower limbs: Secondary | ICD-10-CM | POA: Diagnosis not present

## 2024-01-10 DIAGNOSIS — Z789 Other specified health status: Secondary | ICD-10-CM

## 2024-01-10 MED ORDER — GABAPENTIN 300 MG PO CAPS: 900.0000 mg | ORAL_CAPSULE | Freq: Three times a day (TID) | ORAL | 5 refills | Status: AC

## 2024-01-10 NOTE — Assessment & Plan Note (Signed)
History of alcohol use though endorses he has cut back on this. I did discuss with him the dangers of gabapentin use with alcohol, especially in light of his concurrent use of Hydrocodone and low-dose Alprazolam which he takes prn for anxiety through his PCP.

## 2024-01-10 NOTE — Assessment & Plan Note (Signed)
Chronic bilateral lower extremity neuropathy that has been well controlled on current Gabapentin dose of 900mg  TID. Due for refills. Provided refill today and advised we should try to follow-up every 6 months. He lives in [place], [place] and commutes to continue care with his PCP in [facility] so he plans to follow-up with our [employer] office this July.

## 2024-01-10 NOTE — Progress Notes (Unsigned)
PCP: Pearline Cables, MD  SUBJECTIVE:   DATE OF VISIT: 01/10/2024                                          [place] DOB: 04-05-1956 MRN: [id number]   Virtual Visit via Video Note   I connected with [place] on 01/10/24 at 3:24 PM EST by a video enabled telemedicine application and verified that I am speaking with the correct person using two identifiers.   Location: Patient: home - [place], [place] Provider: Office   I discussed the limitations of evaluation and management by telemedicine and the availability of in person appointments. The patient expressed understanding and agreed to proceed.     CC:  medication refill   History of present Illness: [place] is a 68 y.o. male who presents for a follow-up visit - last seen 07/05/23 for gabapentin refill - longstanding issues with neuropathy in bilateral lower extremities - well controlled with gabapentin 900mg  TID - also on hydrocodone-acetaminophen 10-325mg  TID through his PCP  - denies any recent falls or issues with feeling too sedated - last labs reviewed from 07/2023 -- stable renal function in CKD3a stage, creatinine was 1.3   Past Medical History:  Diagnosis Date   Alcoholism (HCC)    Allergy    Anxiety    Arthritis    COPD (chronic obstructive pulmonary disease) (HCC)    Diverticulitis 2011   resulted in partial colectomy   Dyspnea    Essential hypertension 06/06/2016   Full dentures    Gout    Hematuria    History of kidney stones    Wears glasses    Current Outpatient Medications on File Prior to Visit  Medication Sig Dispense Refill   albuterol (VENTOLIN HFA) 108 (90 Base) MCG/ACT inhaler Inhale 2 puffs into the lungs every 6 (six) hours as needed for wheezing or shortness of breath. 1 each 6   allopurinol (ZYLOPRIM) 300 MG tablet TAKE 1 TABLET(300 MG) BY MOUTH DAILY 90 tablet 3   ALPRAZolam (XANAX) 0.25 MG tablet TAKE 1 TABLET BY MOUTH IN THE MORNING AND AN ADDITONAL TABLET DURING THE DAY OR EVENING AS  NEEDED FOR ANXIETY 45 tablet 2   Ascorbic Acid 1000 MG TBCR Take 1,000 mg by mouth daily.     Cholecalciferol (VITAMIN D3) 25 MCG (1000 UT) CAPS Take 1,000 Units by mouth daily.     gabapentin (NEURONTIN) 300 MG capsule Take 3 capsules (900 mg total) by mouth 3 (three) times daily. 270 capsule 3   HYDROcodone-acetaminophen (NORCO) 10-325 MG tablet Take 1 tablet by mouth every 8 (eight) hours as needed. 75 tablet 0   Melatonin 10 MG TABS Take 10 mg by mouth at bedtime.     rosuvastatin (CRESTOR) 20 MG tablet TAKE 1 TABLET(20 MG) BY MOUTH DAILY 90 tablet 3   sildenafil (VIAGRA) 100 MG tablet Take 100 mg by mouth daily as needed for erectile dysfunction.     traZODone (DESYREL) 50 MG tablet Take 0.5-1 tablets (25-50 mg total) by mouth at bedtime as needed for sleep. 30 tablet 3   triamcinolone cream (KENALOG) 0.1 % Apply 1 Application topically 2 (two) times daily as needed (eczema on legs and ankles). 90 g 2   vitamin B-12 (CYANOCOBALAMIN) 1000 MCG tablet Take 1,000 mcg by mouth daily.     No current facility-administered medications on file prior to visit.  Past Surgical History:  Procedure Laterality Date   ABDOMINAL AORTOGRAM W/LOWER EXTREMITY Bilateral 07/19/2019   Procedure: ABDOMINAL AORTOGRAM W/LOWER EXTREMITY;  Surgeon: Sherren Kerns, MD;  [facility];  Service: Cardiovascular;  Laterality: Bilateral;   ABDOMINAL AORTOGRAM W/LOWER EXTREMITY Bilateral 06/21/2022   Procedure: ABDOMINAL AORTOGRAM W/LOWER EXTREMITY;  Surgeon: Nada Libman, MD;  Location: [facility];  Service: Cardiovascular;  Laterality: Bilateral;   COLON SURGERY  2011   partial colectomy   COLONOSCOPY     FINGER ARTHROPLASTY  1990   rt index fx   HERNIA REPAIR     INGUINAL HERNIA REPAIR Left 05/27/2014   Procedure: LEFT INGUINAL HERNIA REPAIR WITH MESH;  Surgeon: Wilmon Arms. Corliss Skains, MD;  Location: [facility];  Service: General;  Laterality: Left;   INSERTION OF MESH Left  05/27/2014   Procedure: INSERTION OF MESH;  Surgeon: Wilmon Arms. Corliss Skains, MD;  Location: [facility];  Service: General;  Laterality: Left;   TIBIA IM NAIL INSERTION Right 01/18/2022   Procedure: INTRAMEDULLARY (IM) NAIL TIBIAL;  Surgeon: Terance Hart, MD;  Location: [facility];  Service: Orthopedics;  Laterality: Right;    Allergies  Allergen Reactions   Ibuprofen Swelling and Other (See Comments)    Hands and face swell- had to seek medical treatment.  No issue with celecoxib   OBJECTIVE:  There were no vitals taken for this visit.  PHYSICAL EXAM:  GEN: Alert and Oriented, NAD, comfortable in over the phone RESP: Unlabored respirations, symmetric chest rise PSY: normal mood, congruent affect Assessment & Plan Neuropathy involving both lower extremities Chronic bilateral lower extremity neuropathy that has been well controlled on current Gabapentin dose of 900mg  TID. Due for refills. Provided refill today and advised we should try to follow-up every 6 months. He lives in [place], [place] and commutes to continue care with his PCP in [facility] so he plans to follow-up with our [employer] office this July.  Alcohol use History of alcohol use though endorses he has cut back on this. I did discuss with him the dangers of gabapentin use with alcohol, especially in light of his concurrent use of Hydrocodone and low-dose Alprazolam which he takes prn for anxiety through his PCP.  Glean Salen, MD PGY-4, Sports Medicine Fellow [facility]

## 2024-01-25 ENCOUNTER — Other Ambulatory Visit: Payer: Self-pay | Admitting: Family Medicine

## 2024-01-25 ENCOUNTER — Encounter: Payer: Self-pay | Admitting: Family Medicine

## 2024-01-25 DIAGNOSIS — G629 Polyneuropathy, unspecified: Secondary | ICD-10-CM

## 2024-01-25 DIAGNOSIS — F411 Generalized anxiety disorder: Secondary | ICD-10-CM

## 2024-01-25 DIAGNOSIS — G8929 Other chronic pain: Secondary | ICD-10-CM

## 2024-01-25 DIAGNOSIS — I739 Peripheral vascular disease, unspecified: Secondary | ICD-10-CM

## 2024-01-25 DIAGNOSIS — F4321 Adjustment disorder with depressed mood: Secondary | ICD-10-CM

## 2024-01-25 MED ORDER — ALPRAZOLAM 0.25 MG PO TABS: ORAL_TABLET | ORAL | 0 refills | Status: DC

## 2024-01-25 MED ORDER — HYDROCODONE-ACETAMINOPHEN 10-325 MG PO TABS: 1.0000 | ORAL_TABLET | Freq: Three times a day (TID) | ORAL | 0 refills | Status: DC | PRN

## 2024-01-25 NOTE — Telephone Encounter (Signed)
Copied from CRM [id number]. Topic: Clinical - Medication Refill >> Jan 25, 2024 10:24 AM Almira Coaster wrote: Most Recent Primary Care Visit:  Provider: Pearline Cables  Department: [facility]  Visit Type: OFFICE VISIT  Date: 07/31/2023  Medication: HYDROcodone-acetaminophen (NORCO) 10-325 MG tablet ,ALPRAZolam (XANAX) 0.25 MG tablet   Has the patient contacted their pharmacy? No, controlled meds, needs prescriptions to be sent per patient. (Agent: If no, request that the patient contact the pharmacy for the refill. If patient does not wish to contact the pharmacy document the reason why and proceed with request.) (Agent: If yes, when and what did the pharmacy advise?)  Is this the correct pharmacy for this prescription? Yes If no, delete pharmacy and type the correct one.  This is the patient's preferred pharmacy:  [facility] DRUG STORE #[id number] [facility] [place] - [address] AT [facility] OF [address] [place] [address] Phone: [phone] Fax: [phone]   Has the prescription been filled recently? No  Is the patient out of the medication? No, runs out on Sunday.   Has the patient been seen for an appointment in the last year OR does the patient have an upcoming appointment? Yes  Can we respond through MyChart? Yes  Agent: Please be advised that Rx refills may take up to 3 business days. We ask that you follow-up with your pharmacy.

## 2024-02-27 ENCOUNTER — Other Ambulatory Visit: Payer: Self-pay | Admitting: Family Medicine

## 2024-02-27 DIAGNOSIS — G629 Polyneuropathy, unspecified: Secondary | ICD-10-CM

## 2024-02-27 DIAGNOSIS — F411 Generalized anxiety disorder: Secondary | ICD-10-CM

## 2024-02-27 DIAGNOSIS — G8929 Other chronic pain: Secondary | ICD-10-CM

## 2024-02-27 DIAGNOSIS — I739 Peripheral vascular disease, unspecified: Secondary | ICD-10-CM

## 2024-02-27 DIAGNOSIS — F4321 Adjustment disorder with depressed mood: Secondary | ICD-10-CM

## 2024-02-27 MED ORDER — ALPRAZOLAM 0.25 MG PO TABS: ORAL_TABLET | ORAL | 0 refills | Status: DC

## 2024-02-27 MED ORDER — HYDROCODONE-ACETAMINOPHEN 10-325 MG PO TABS
1.0000 | ORAL_TABLET | Freq: Three times a day (TID) | ORAL | 0 refills | Status: DC | PRN
Start: 2024-02-27 — End: 2024-03-29

## 2024-02-27 NOTE — Telephone Encounter (Signed)
 Copied from CRM [id number]. Topic: Clinical - Medication Refill >> Feb 27, 2024 10:24 AM Adele Barthel wrote: Most Recent Primary Care Visit:  Provider: Pearline Cables  Department: [facility]  Visit Type: OFFICE VISIT  Date: 07/31/2023  Medication: HYDROcodone-acetaminophen (NORCO) 10-325 MG tablet, ALPRAZolam (XANAX) 0.25 MG tablet  Has the patient contacted their pharmacy? Yes (Agent: If no, request that the patient contact the pharmacy for the refill. If patient does not wish to contact the pharmacy document the reason why and proceed with request.) (Agent: If yes, when and what did the pharmacy advise?)  Is this the correct pharmacy for this prescription? Yes If no, delete pharmacy and type the correct one.  This is the patient's preferred pharmacy:  [facility] DR[facility] STORE #[id number] [facility] [place] - [address] AT [facility] OF [address] [place] [address] Phone: [phone] Fax: [phone]   Has the prescription been filled recently? Yes  Is the patient out of the medication? No  Has the patient been seen for an appointment in the last year OR does the patient have an upcoming appointment? Yes  Can we respond through MyChart? Yes  Agent: Please be advised that Rx refills may take up to 3 business days. We ask that you follow-up with your pharmacy.

## 2024-02-29 ENCOUNTER — Ambulatory Visit (INDEPENDENT_AMBULATORY_CARE_PROVIDER_SITE_OTHER): Admitting: Family Medicine

## 2024-02-29 VITALS — BP 102/60 | HR 100 | Temp 97.7°F | Resp 18 | Ht 72.0 in | Wt 157.2 lb

## 2024-02-29 DIAGNOSIS — R972 Elevated prostate specific antigen [PSA]: Secondary | ICD-10-CM

## 2024-02-29 DIAGNOSIS — G629 Polyneuropathy, unspecified: Secondary | ICD-10-CM | POA: Diagnosis not present

## 2024-02-29 DIAGNOSIS — Z5181 Encounter for therapeutic drug level monitoring: Secondary | ICD-10-CM

## 2024-02-29 DIAGNOSIS — Z131 Encounter for screening for diabetes mellitus: Secondary | ICD-10-CM | POA: Diagnosis not present

## 2024-02-29 DIAGNOSIS — E538 Deficiency of other specified B group vitamins: Secondary | ICD-10-CM

## 2024-02-29 DIAGNOSIS — E785 Hyperlipidemia, unspecified: Secondary | ICD-10-CM

## 2024-02-29 DIAGNOSIS — I10 Essential (primary) hypertension: Secondary | ICD-10-CM

## 2024-02-29 DIAGNOSIS — Z125 Encounter for screening for malignant neoplasm of prostate: Secondary | ICD-10-CM | POA: Diagnosis not present

## 2024-02-29 DIAGNOSIS — E559 Vitamin D deficiency, unspecified: Secondary | ICD-10-CM | POA: Diagnosis not present

## 2024-02-29 DIAGNOSIS — D649 Anemia, unspecified: Secondary | ICD-10-CM

## 2024-02-29 DIAGNOSIS — F17200 Nicotine dependence, unspecified, uncomplicated: Secondary | ICD-10-CM

## 2024-02-29 DIAGNOSIS — F411 Generalized anxiety disorder: Secondary | ICD-10-CM

## 2024-02-29 DIAGNOSIS — I739 Peripheral vascular disease, unspecified: Secondary | ICD-10-CM | POA: Diagnosis not present

## 2024-02-29 DIAGNOSIS — N189 Chronic kidney disease, unspecified: Secondary | ICD-10-CM | POA: Diagnosis not present

## 2024-02-29 NOTE — Progress Notes (Addendum)
 [facility] [address], [address] [place], [place] [address] [phone]  Date:  02/29/2024   Name:  Jonathan [place]   DOB:  12-25-1955   MRN:  [id number]  PCP:  Pearline Cables, MD    Chief Complaint: Follow-up (Concerns/ questions: clarification on meds)   History of Present Illness:  [place] is a 68 y.o. very pleasant male patient who presents with the following:  Patient seen today for periodic follow-up Most recent visit with myself was in August History of anxiety, diverticulitis, neuropathy, hypertension, chronic joint pain, heavy tobacco use, COPD, dyslipidemia He has neuropathy and lower limb claudication-much worse with standing or walking. He retired from his work last year which has made things easier.  He is treated with narcotics for chronic pain; I am prescribing hydrocodone 7.5, number 70 tablets monthly Gabapentin 300 mg 3 times daily He is also taking alprazolam for anxiety, 0.25 mg 45/month He is followed by vascular surgery for his lower limb claudication-  At our last visit in August he had complained of worsening pain, I did increase his quantity of hydrocodone milligrams to 75 pills/month He is also receiving gabapentin for sports medicine, currently on 900 mg 3 times daily- this does help him   I will again remind patients that it is not safe to use alcohol with his medications  Lung cancer screening CT- they have not been able to get this done as they live 2 [place] from [facility]. We were able to find an imaging location close to home where I sent this order Flu shot- he declines flu shot today  COVID booster- recommended Due for tetanus- recommended Shingrix- recommended He did complete Cologuard last year-negative  He notes he is not sleeping well He is drinking 2 or 3 beers in the afternoon- before dinner.    He is smoking 2 PPD  UDS is due in August  BP Readings from Last 3 Encounters:   02/29/24 102/60  07/31/23 122/64  07/05/23 (!) 104/58    Patient Active Problem List   Diagnosis Date Noted   Peripheral arterial disease (HCC) 06/07/2022   Anemia 06/07/2022   Closed fracture of right tibia and fibula 01/17/2022   Tobacco use 01/17/2022   Alcohol use    Chronic pain    Hyperlipidemia    COPD (chronic obstructive pulmonary disease) (HCC) 12/20/2020   Neuropathy 06/04/2019   Alcoholism (HCC) 11/19/2018   Essential hypertension 06/06/2016   Smoker 05/12/2016   Left inguinal hernia 05/23/2014   Anxiety 09/17/2013   Gout 01/17/2013   Diverticulosis of colon 08/13/2010   DIVERTICULITIS, COLON 08/13/2010   ABDOMINAL PAIN-LLQ 08/13/2010   Nonspecific (abnormal) findings on radiological and other examination of body structure 08/13/2010   History of colonic polyps 08/13/2010   NONSPCIFC ABN FINDING RAD & OTH EXAM LUNG FIELD 08/13/2010    Past Medical History:  Diagnosis Date   Alcoholism (HCC)    Allergy    Anxiety    Arthritis    COPD (chronic obstructive pulmonary disease) (HCC)    Diverticulitis 2011   resulted in partial colectomy   Dyspnea    Essential hypertension 06/06/2016   Full dentures    Gout    Hematuria    History of kidney stones    Wears glasses     Past Surgical History:  Procedure Laterality Date   ABDOMINAL AORTOGRAM W/LOWER EXTREMITY Bilateral 07/19/2019   Procedure: ABDOMINAL AORTOGRAM W/LOWER  EXTREMITY;  Surgeon: Sherren Kerns, MD;  Location: [facility];  Service: Cardiovascular;  Laterality: Bilateral;   ABDOMINAL AORTOGRAM W/LOWER EXTREMITY Bilateral 06/21/2022   Procedure: ABDOMINAL AORTOGRAM W/LOWER EXTREMITY;  Surgeon: Nada Libman, MD;  [facility];  Service: Cardiovascular;  Laterality: Bilateral;   COLON SURGERY  2011   partial colectomy   COLONOSCOPY     FINGER ARTHROPLASTY  1990   rt index fx   HERNIA REPAIR     INGUINAL HERNIA REPAIR Left 05/27/2014   Procedure: LEFT INGUINAL HERNIA  REPAIR WITH MESH;  Surgeon: Wilmon Arms. Corliss Skains, MD;  Location: [facility];  Service: General;  Laterality: Left;   INSERTION OF MESH Left 05/27/2014   Procedure: INSERTION OF MESH;  Surgeon: Wilmon Arms. Corliss Skains, MD;  Location:  SURGERY CENTER;  Service: General;  Laterality: Left;   TIBIA IM NAIL INSERTION Right 01/18/2022   Procedure: INTRAMEDULLARY (IM) NAIL TIBIAL;  Surgeon: Terance Hart, MD;  Location: [facility] OR;  Service: Orthopedics;  Laterality: Right;    Social History   Tobacco Use   Smoking status: Every Day    Current packs/day: 2.00    Types: Cigarettes   Smokeless tobacco: Never  Vaping Use   Vaping status: Never Used  Substance Use Topics   Alcohol use: Yes    Alcohol/week: 21.0 standard drinks of alcohol    Types: 21 Cans of beer per week    Comment: about 3 beers daily- history of heavy liquor abuse   Drug use: No    Family History  Problem Relation Age of Onset   Alzheimer's disease Mother    Cancer Sister    Neuropathy Neg Hx     Allergies  Allergen Reactions   Ibuprofen Swelling and Other (See Comments)    Hands and face swell- had to seek medical treatment.  No issue with celecoxib    Medication list has been reviewed and updated.  Current Outpatient Medications on File Prior to Visit  Medication Sig Dispense Refill   albuterol (VENTOLIN HFA) 108 (90 Base) MCG/ACT inhaler Inhale 2 puffs into the lungs every 6 (six) hours as needed for wheezing or shortness of breath. 1 each 6   allopurinol (ZYLOPRIM) 300 MG tablet TAKE 1 TABLET(300 MG) BY MOUTH DAILY 90 tablet 3   ALPRAZolam (XANAX) 0.25 MG tablet TAKE 1 TABLET BY MOUTH IN THE MORNING AND AN ADDITONAL TABLET DURING THE DAY OR EVENING AS NEEDED FOR ANXIETY 45 tablet 0   Cholecalciferol (VITAMIN D3) 25 MCG (1000 UT) CAPS Take 1,000 Units by mouth daily.     gabapentin (NEURONTIN) 300 MG capsule Take 3 capsules (900 mg total) by mouth 3 (three) times daily. 270 capsule 5    HYDROcodone-acetaminophen (NORCO) 10-325 MG tablet Take 1 tablet by mouth every 8 (eight) hours as needed. Office visit needed please 75 tablet 0   Melatonin 10 MG TABS Take 10 mg by mouth at bedtime.     rosuvastatin (CRESTOR) 20 MG tablet TAKE 1 TABLET(20 MG) BY MOUTH DAILY 90 tablet 3   traZODone (DESYREL) 50 MG tablet Take 0.5-1 tablets (25-50 mg total) by mouth at bedtime as needed for sleep. 30 tablet 3   triamcinolone cream (KENALOG) 0.1 % Apply 1 Application topically 2 (two) times daily as needed (eczema on legs and ankles). 90 g 2   vitamin B-12 (CYANOCOBALAMIN) 1000 MCG tablet Take 1,000 mcg by mouth daily.     No current facility-administered medications on  file prior to visit.    Review of Systems:  As per HPI- otherwise negative.   Physical Examination: Vitals:   02/29/24 1430 02/29/24 1455  BP: 102/60   Pulse: 100   Resp: 18   Temp: 97.7 F (36.5 C)   SpO2: 91% 96%   Vitals:   02/29/24 1430  Weight: 157 lb 3.2 oz (71.3 kg)  Height: 6' (1.829 m)   Body mass index is 21.32 kg/m. Ideal Body Weight: Weight in (lb) to have BMI = 25: 183.9  GEN: no acute distress. Normal weight HEENT: Atraumatic, Normocephalic.  Ears and Nose: No external deformity. CV: RRR, No M/G/R. No JVD. No thrill. No extra heart sounds. PULM: CTA B, no wheezes, crackles, rhonchi. No retractions. No resp. distress. No accessory muscle use. ABD: S, NT, ND EXTR: No c/c/e PSYCH: Normally interactive. Conversant.  Clubbing of fingers is present  Assessment and Plan: Dyslipidemia - Plan: Lipid panel  Neuropathy  Peripheral arterial disease (HCC)  GAD (generalized anxiety disorder)  Chronic renal impairment, unspecified CKD stage - Plan: CBC, Comprehensive metabolic panel  Essential hypertension - Plan: CBC, Comprehensive metabolic panel  Medication monitoring encounter  Screening for diabetes mellitus - Plan: Hemoglobin A1c  Special screening, prostate cancer - Plan: PSA  Smoker  - Plan: CT CHEST LUNG CA SCREEN LOW DOSE W/O CM  B12 deficiency - Plan: Vitamin B12  Vitamin D deficiency - Plan: VITAMIN D 25 Hydroxy (Vit-D Deficiency, Fractures) Pt seen today for recheck He is taking B12 and vitamin D, they would like to check his levels today Ordered lung cancer screening CT BP is under control on no medication I again counseled Jonathan Harrington that it is not safe for him to drink alcohol with his medication and he states understanding.   I advised him quitting smoking and drinking alcohol will likely improve his sleep   Will plan further follow- up pending labs. Recheck here in 6 months   Signed Abbe Amsterdam, MD  Addendum 3/24, received labs as below.  Message to patient  Results for orders placed or performed in visit on 02/29/24  CBC   Collection Time: 02/29/24  3:06 PM  Result Value Ref Range   WBC 5.2 4.0 - 10.5 K/uL   RBC 3.56 (L) 4.22 - 5.81 Mil/uL   Platelets 135.0 (L) 150.0 - 400.0 K/uL   Hemoglobin 12.7 (L) 13.0 - 17.0 g/dL   HCT 84.6 (L) 96.2 - 95.2 %   MCV 103.1 (H) 78.0 - 100.0 fl   MCHC 34.5 30.0 - 36.0 g/dL   RDW 84.1 32.4 - 40.1 %  Comprehensive metabolic panel   Collection Time: 02/29/24  3:06 PM  Result Value Ref Range   Sodium 134 (L) 135 - 145 mEq/L   Potassium 4.8 3.5 - 5.1 mEq/L   Chloride 98 96 - 112 mEq/L   CO2 27 19 - 32 mEq/L   Glucose, Bld 144 (H) 70 - 99 mg/dL   BUN 20 6 - 23 mg/dL   Creatinine, Ser 0.27 (H) 0.40 - 1.50 mg/dL   Total Bilirubin 0.6 0.2 - 1.2 mg/dL   Alkaline Phosphatase 51 39 - 117 U/L   AST 22 0 - 37 U/L   ALT 10 0 - 53 U/L   Total Protein 6.9 6.0 - 8.3 g/dL   Albumin 4.1 3.5 - 5.2 g/dL   GFR 25.36 (L) >64.40 mL/min   Calcium 9.4 8.4 - 10.5 mg/dL  Hemoglobin H4V   Collection Time: 02/29/24  3:06 PM  Result Value Ref Range   Hgb A1c MFr Bld 5.4 4.6 - 6.5 %  Lipid panel   Collection Time: 02/29/24  3:06 PM  Result Value Ref Range   Cholesterol 96 0 - 200 mg/dL   Triglycerides 098.1 (H) 0.0 - 149.0  mg/dL   HDL 19.14 >78.29 mg/dL   VLDL 56.2 0.0 - 13.0 mg/dL   LDL Cholesterol 15 0 - 99 mg/dL   Total CHOL/HDL Ratio 2    NonHDL 45.71   PSA   Collection Time: 02/29/24  3:06 PM  Result Value Ref Range   PSA 1.18 0.10 - 4.00 ng/mL  VITAMIN D 25 Hydroxy (Vit-D Deficiency, Fractures)   Collection Time: 02/29/24  3:06 PM  Result Value Ref Range   VITD 49.16 30.00 - 100.00 ng/mL  Vitamin B12   Collection Time: 02/29/24  3:06 PM  Result Value Ref Range   Vitamin B-12 1,420 (H) 211 - 911 pg/mL

## 2024-02-29 NOTE — Patient Instructions (Addendum)
 It was good to see you again today, I will be in touch with your labs B12 1000 mcg daily Vit D 2000 international units daily   I sent the order for your lung cancer screening CT to the hospital in [place].  If you don't hear from them please give them a call Address: [address], [place], [place] [address] Phone: [phone]  It is not safe to drink alcohol with your medications- this also is disturbing your sleep I would recommend that you cut down over the course of a couple of weeks with an aim to stop drinking alcohol entirely Nicotine is not helping you sleep- quitting smoking will also help you sleep better  Please see me in about 6 months

## 2024-03-01 ENCOUNTER — Other Ambulatory Visit: Payer: Self-pay | Admitting: Family Medicine

## 2024-03-01 DIAGNOSIS — M1000 Idiopathic gout, unspecified site: Secondary | ICD-10-CM

## 2024-03-01 LAB — COMPREHENSIVE METABOLIC PANEL
ALT: 10 U/L (ref 0–53)
AST: 22 U/L (ref 0–37)
Albumin: 4.1 g/dL (ref 3.5–5.2)
Alkaline Phosphatase: 51 U/L (ref 39–117)
BUN: 20 mg/dL (ref 6–23)
CO2: 27 meq/L (ref 19–32)
Calcium: 9.4 mg/dL (ref 8.4–10.5)
Chloride: 98 meq/L (ref 96–112)
Creatinine, Ser: 1.54 mg/dL — ABNORMAL HIGH (ref 0.40–1.50)
GFR: 45.59 mL/min — ABNORMAL LOW (ref >60.00)
Glucose, Bld: 144 mg/dL — ABNORMAL HIGH (ref 70–99)
Potassium: 4.8 meq/L (ref 3.5–5.1)
Sodium: 134 meq/L — ABNORMAL LOW (ref 135–145)
Total Bilirubin: 0.6 mg/dL (ref 0.2–1.2)
Total Protein: 6.9 g/dL (ref 6.0–8.3)

## 2024-03-01 LAB — LIPID PANEL
Cholesterol: 96 mg/dL (ref 0–200)
HDL: 50.4 mg/dL (ref >39.00)
LDL Cholesterol: 15 mg/dL (ref 0–99)
NonHDL: 45.71
Total CHOL/HDL Ratio: 2
Triglycerides: 155 mg/dL — ABNORMAL HIGH (ref 0.0–149.0)
VLDL: 31 mg/dL (ref 0.0–40.0)

## 2024-03-01 LAB — CBC
HCT: 36.7 % — ABNORMAL LOW (ref 39.0–52.0)
Hemoglobin: 12.7 g/dL — ABNORMAL LOW (ref 13.0–17.0)
MCHC: 34.5 g/dL (ref 30.0–36.0)
MCV: 103.1 fl — ABNORMAL HIGH (ref 78.0–100.0)
Platelets: 135 10*3/uL — ABNORMAL LOW (ref 150.0–400.0)
RBC: 3.56 Mil/uL — ABNORMAL LOW (ref 4.22–5.81)
RDW: 14.5 % (ref 11.5–15.5)
WBC: 5.2 10*3/uL (ref 4.0–10.5)

## 2024-03-01 LAB — HEMOGLOBIN A1C: Hgb A1c MFr Bld: 5.4 % (ref 4.6–6.5)

## 2024-03-01 LAB — VITAMIN B12: Vitamin B-12: 1420 pg/mL — ABNORMAL HIGH (ref 211–911)

## 2024-03-01 LAB — PSA: PSA: 1.18 ng/mL (ref 0.10–4.00)

## 2024-03-01 LAB — VITAMIN D 25 HYDROXY (VIT D DEFICIENCY, FRACTURES): VITD: 49.16 ng/mL (ref 30.00–100.00)

## 2024-03-04 ENCOUNTER — Encounter: Payer: Self-pay | Admitting: Family Medicine

## 2024-03-04 ENCOUNTER — Other Ambulatory Visit: Payer: Self-pay | Admitting: Family Medicine

## 2024-03-04 DIAGNOSIS — M1000 Idiopathic gout, unspecified site: Secondary | ICD-10-CM

## 2024-03-04 NOTE — Addendum Note (Signed)
 Addended by: Pearline Cables on: 03/04/2024 05:29 PM   Modules accepted: Orders

## 2024-03-05 ENCOUNTER — Other Ambulatory Visit: Payer: Self-pay | Admitting: Family Medicine

## 2024-03-05 ENCOUNTER — Telehealth: Payer: Self-pay | Admitting: Family Medicine

## 2024-03-05 DIAGNOSIS — M1000 Idiopathic gout, unspecified site: Secondary | ICD-10-CM

## 2024-03-05 NOTE — Telephone Encounter (Signed)
 Mailed

## 2024-03-05 NOTE — Telephone Encounter (Signed)
 Copied from CRM [id number]. Topic: Clinical - Prescription Issue >> Mar 05, 2024 10:09 AM Denese Killings wrote: Reason for CRM: Patient stated that prescription for allopurinol (ZYLOPRIM) 300 MG tablet. Patient needs medication as soon as possible, he has been out for 3 days.

## 2024-03-05 NOTE — Telephone Encounter (Signed)
 [facility] called, long hold, unable to verify if med was received.

## 2024-03-19 ENCOUNTER — Encounter: Payer: Self-pay | Admitting: Family Medicine

## 2024-03-29 ENCOUNTER — Other Ambulatory Visit: Payer: Self-pay | Admitting: Family Medicine

## 2024-03-29 DIAGNOSIS — G8929 Other chronic pain: Secondary | ICD-10-CM

## 2024-03-29 DIAGNOSIS — G629 Polyneuropathy, unspecified: Secondary | ICD-10-CM

## 2024-03-29 DIAGNOSIS — I739 Peripheral vascular disease, unspecified: Secondary | ICD-10-CM

## 2024-03-29 NOTE — Telephone Encounter (Signed)
 Copied from CRM [id number]. Topic: Clinical - Medication Refill >> Mar 29, 2024 11:21 AM China J wrote: Most Recent Primary Care Visit:  Provider: WATT HARLENE BROCKS  Department: [facility]  Visit Type: OFFICE VISIT  Date: 02/29/2024  Medication: HYDROcodone -acetaminophen  (NORCO) 10-325 MG tablet   Has the patient contacted their pharmacy? Yes (Agent: If no, request that the patient contact the pharmacy for the refill. If patient does not wish to contact the pharmacy document the reason why and proceed with request.) (Agent: If yes, when and what did the pharmacy advise?) No refills available. Is this the correct pharmacy for this prescription? Yes If no, delete pharmacy and type the correct one.  This is the patient's preferred pharmacy:  [facility] DR[facility] STORE #[id number] [facility] [place] - [address] AT [facility] OF [address] [place] [address] Phone: [phone] Fax: [phone]   Has the prescription been filled recently? No  Is the patient out of the medication? Yes  Has the patient been seen for an appointment in the last year OR does the patient have an upcoming appointment? Yes  Can we respond through MyChart? Yes  Agent: Please be advised that Rx refills may take up to 3 business days. We ask that you follow-up with your pharmacy.

## 2024-04-01 MED ORDER — HYDROCODONE-ACETAMINOPHEN 10-325 MG PO TABS
1.0000 | ORAL_TABLET | Freq: Three times a day (TID) | ORAL | 0 refills | Status: DC | PRN
Start: 2024-04-01 — End: 2024-05-02

## 2024-04-10 ENCOUNTER — Other Ambulatory Visit: Payer: Self-pay | Admitting: Family Medicine

## 2024-04-10 DIAGNOSIS — F4321 Adjustment disorder with depressed mood: Secondary | ICD-10-CM

## 2024-04-10 DIAGNOSIS — F411 Generalized anxiety disorder: Secondary | ICD-10-CM

## 2024-04-10 NOTE — Telephone Encounter (Signed)
 Copied from CRM [id number]. Topic: Clinical - Medication Refill >> Apr 10, 2024 12:14 PM Howard Macho wrote: Most Recent Primary Care Visit:  Provider: Kaylee Partridge  Department: [facility]  Visit Type: OFFICE VISIT  Date: 02/29/2024  Medication: ALPRAZolam  (XANAX ) 0.25 MG tablet  Has the patient contacted their pharmacy? No (Agent: If no, request that the patient contact the pharmacy for the refill. If patient does not wish to contact the pharmacy document the reason why and proceed with request.) (Agent: If yes, when and what did the pharmacy advise?)  Is this the correct pharmacy for this prescription? Yes If no, delete pharmacy and type the correct one.  This is the patient's preferred pharmacy:  [facility] DRUG STORE #[id number] [facility] [place] - [address] AT [facility] OF [address] [place] [address] Phone: [phone] Fax: [phone]   Has the prescription been filled recently? No  Is the patient out of the medication? Yes  Has the patient been seen for an appointment in the last year OR does the patient have an upcoming appointment? Yes  Can we respond through MyChart? Yes  Agent: Please be advised that Rx refills may take up to 3 business days. We ask that you follow-up with your pharmacy.

## 2024-04-11 MED ORDER — ALPRAZOLAM 0.25 MG PO TABS: ORAL_TABLET | ORAL | 1 refills | Status: DC

## 2024-04-11 NOTE — Telephone Encounter (Signed)
 Last Fill: 02/27/24 45 tabs/0 RF  Last OV: 02/29/24 Next OV: None Scheduled  Routing to provider for review/authorization.

## 2024-04-28 ENCOUNTER — Other Ambulatory Visit: Payer: Self-pay | Admitting: Family Medicine

## 2024-04-28 DIAGNOSIS — F5101 Primary insomnia: Secondary | ICD-10-CM

## 2024-05-02 ENCOUNTER — Other Ambulatory Visit: Payer: Self-pay | Admitting: Family Medicine

## 2024-05-02 DIAGNOSIS — F411 Generalized anxiety disorder: Secondary | ICD-10-CM

## 2024-05-02 DIAGNOSIS — F4321 Adjustment disorder with depressed mood: Secondary | ICD-10-CM

## 2024-05-02 DIAGNOSIS — G629 Polyneuropathy, unspecified: Secondary | ICD-10-CM

## 2024-05-02 DIAGNOSIS — G8929 Other chronic pain: Secondary | ICD-10-CM

## 2024-05-02 DIAGNOSIS — I739 Peripheral vascular disease, unspecified: Secondary | ICD-10-CM

## 2024-05-02 MED ORDER — ALPRAZOLAM 0.25 MG PO TABS: ORAL_TABLET | ORAL | 1 refills | Status: DC

## 2024-05-02 MED ORDER — HYDROCODONE-ACETAMINOPHEN 10-325 MG PO TABS: 1.0000 | ORAL_TABLET | Freq: Three times a day (TID) | ORAL | 0 refills | Status: DC | PRN

## 2024-05-02 NOTE — Telephone Encounter (Signed)
 Copied from CRM [id number]. Topic: Clinical - Medication Refill >> May 02, 2024  1:11 PM Magdalene School wrote: Medication: HYDROcodone -acetaminophen  (NORCO) 10-325 MG tablet ALPRAZolam  (XANAX ) 0.25 MG tablet  Has the patient contacted their pharmacy? No (Agent: If no, request that the patient contact the pharmacy for the refill. If patient does not wish to contact the pharmacy document the reason why and proceed with request.) (Agent: If yes, when and what did the pharmacy advise?)  This is the patient's preferred pharmacy:  [facility] #[id number] - [place], [place] - [address] AT [facility] OF [address] [place] [address] Phone: [phone] Fax: [phone]  Is this the correct pharmacy for this prescription? Yes If no, delete pharmacy and type the correct one.   Has the prescription been filled recently? No  Is the patient out of the medication? Yes  Has the patient been seen for an appointment in the last year OR does the patient have an upcoming appointment? Yes  Can we respond through MyChart? No  Agent: Please be advised that Rx refills may take up to 3 business days. We ask that you follow-up with your pharmacy.

## 2024-05-23 ENCOUNTER — Other Ambulatory Visit: Payer: Self-pay | Admitting: Family Medicine

## 2024-05-23 DIAGNOSIS — E785 Hyperlipidemia, unspecified: Secondary | ICD-10-CM

## 2024-05-29 DIAGNOSIS — N5203 Combined arterial insufficiency and corporo-venous occlusive erectile dysfunction: Secondary | ICD-10-CM | POA: Diagnosis not present

## 2024-06-13 ENCOUNTER — Other Ambulatory Visit: Payer: Self-pay | Admitting: Family Medicine

## 2024-06-13 DIAGNOSIS — F411 Generalized anxiety disorder: Secondary | ICD-10-CM

## 2024-06-13 DIAGNOSIS — I739 Peripheral vascular disease, unspecified: Secondary | ICD-10-CM

## 2024-06-13 DIAGNOSIS — G8929 Other chronic pain: Secondary | ICD-10-CM

## 2024-06-13 DIAGNOSIS — F4321 Adjustment disorder with depressed mood: Secondary | ICD-10-CM

## 2024-06-13 DIAGNOSIS — G629 Polyneuropathy, unspecified: Secondary | ICD-10-CM

## 2024-06-13 MED ORDER — ALPRAZOLAM 0.25 MG PO TABS: ORAL_TABLET | ORAL | 1 refills | Status: DC

## 2024-06-13 NOTE — Telephone Encounter (Signed)
 Copied from CRM [id number]. Topic: Clinical - Medication Refill >> Jun 13, 2024  9:30 AM Martinique E wrote: Medication: ALPRAZolam  (XANAX ) 0.25 MG tablet  Has the patient contacted their pharmacy? No (Agent: If no, request that the patient contact the pharmacy for the refill. If patient does not wish to contact the pharmacy document the reason why and proceed with request.) (Agent: If yes, when and what did the pharmacy advise?)  This is the patient's preferred pharmacy:  [facility] #[id number] - [place], [place] - [address] AT [facility] OF [address] [place] [address] Phone: [phone] Fax: [phone]  Is this the correct pharmacy for this prescription? Yes If no, delete pharmacy and type the correct one.   Has the prescription been filled recently? No  Is the patient out of the medication? Yes  Has the patient been seen for an appointment in the last year OR does the patient have an upcoming appointment? Yes  Can we respond through MyChart? Yes  Agent: Please be advised that Rx refills may take up to 3 business days. We ask that you follow-up with your pharmacy.

## 2024-06-13 NOTE — Telephone Encounter (Signed)
 Copied from CRM [id number]. Topic: Clinical - Medication Refill >> Jun 13, 2024  4:16 PM Leah C wrote: Medication: HYDROcodone -acetaminophen  (NORCO) 10-325 MG tablet  Has the patient contacted their pharmacy? Yes (Agent: If no, request that the patient contact the pharmacy for the refill. If patient does not wish to contact the pharmacy document the reason why and proceed with request.) (Agent: If yes, when and what did the pharmacy advise?)  This is the patient's preferred pharmacy:  [facility] #[id number] - [place], [place] - [address] AT [facility] OF [address] [place] [address] Phone: [phone] Fax: [phone]  Is this the correct pharmacy for this prescription? Yes If no, delete pharmacy and type the correct one.   Has the prescription been filled recently? Yes  Is the patient out of the medication? Yes  Has the patient been seen for an appointment in the last year OR does the patient have an upcoming appointment? Yes  Can we respond through MyChart? Yes.   Agent: Please be advised that Rx refills may take up to 3 business days. We ask that you follow-up with your pharmacy.

## 2024-06-13 NOTE — Telephone Encounter (Signed)
 05/02/24 45 tabs/1 RF

## 2024-06-14 MED ORDER — HYDROCODONE-ACETAMINOPHEN 10-325 MG PO TABS: 1.0000 | ORAL_TABLET | Freq: Three times a day (TID) | ORAL | 0 refills | Status: DC | PRN

## 2024-06-20 DIAGNOSIS — M79674 Pain in right toe(s): Secondary | ICD-10-CM | POA: Diagnosis not present

## 2024-06-20 DIAGNOSIS — I739 Peripheral vascular disease, unspecified: Secondary | ICD-10-CM | POA: Diagnosis not present

## 2024-06-20 DIAGNOSIS — M792 Neuralgia and neuritis, unspecified: Secondary | ICD-10-CM | POA: Diagnosis not present

## 2024-06-20 DIAGNOSIS — I70213 Atherosclerosis of native arteries of extremities with intermittent claudication, bilateral legs: Secondary | ICD-10-CM | POA: Diagnosis not present

## 2024-06-20 DIAGNOSIS — G603 Idiopathic progressive neuropathy: Secondary | ICD-10-CM | POA: Diagnosis not present

## 2024-06-20 DIAGNOSIS — L84 Corns and callosities: Secondary | ICD-10-CM | POA: Diagnosis not present

## 2024-06-20 DIAGNOSIS — B351 Tinea unguium: Secondary | ICD-10-CM | POA: Diagnosis not present

## 2024-06-20 DIAGNOSIS — M79675 Pain in left toe(s): Secondary | ICD-10-CM | POA: Diagnosis not present

## 2024-07-02 DIAGNOSIS — E785 Hyperlipidemia, unspecified: Secondary | ICD-10-CM | POA: Diagnosis not present

## 2024-07-02 DIAGNOSIS — I739 Peripheral vascular disease, unspecified: Secondary | ICD-10-CM | POA: Diagnosis not present

## 2024-07-02 DIAGNOSIS — F17200 Nicotine dependence, unspecified, uncomplicated: Secondary | ICD-10-CM | POA: Diagnosis not present

## 2024-07-02 DIAGNOSIS — G609 Hereditary and idiopathic neuropathy, unspecified: Secondary | ICD-10-CM | POA: Diagnosis not present

## 2024-07-23 ENCOUNTER — Other Ambulatory Visit: Payer: Self-pay | Admitting: Family Medicine

## 2024-07-23 DIAGNOSIS — F411 Generalized anxiety disorder: Secondary | ICD-10-CM

## 2024-07-23 DIAGNOSIS — F4321 Adjustment disorder with depressed mood: Secondary | ICD-10-CM

## 2024-07-23 DIAGNOSIS — G8929 Other chronic pain: Secondary | ICD-10-CM

## 2024-07-23 DIAGNOSIS — G629 Polyneuropathy, unspecified: Secondary | ICD-10-CM

## 2024-07-23 DIAGNOSIS — I739 Peripheral vascular disease, unspecified: Secondary | ICD-10-CM

## 2024-07-23 NOTE — Telephone Encounter (Signed)
 Copied from CRM [id number]. Topic: Clinical - Medication Refill >> Jul 23, 2024  5:03 PM Abigail D wrote: Medication: HYDROcodone -acetaminophen  (NORCO) 10-325 MG tablet ALPRAZolam  (XANAX ) 0.25 MG tablet  Has the patient contacted their pharmacy? No (Agent: If no, request that the patient contact the pharmacy for the refill. If patient does not wish to contact the pharmacy document the reason why and proceed with request.) (Agent: If yes, when and what did the pharmacy advise?)  This is the patient's preferred pharmacy:  [facility]ORE #[id number] - [place],  - [address] AT [facility] OF [address] [place] [address] Phone: [phone] Fax: [phone]  Is this the correct pharmacy for this prescription? Yes If no, delete pharmacy and type the correct one.   Has the prescription been filled recently? No  Is the patient out of the medication? Yes  Has the patient been seen for an appointment in the last year OR does the patient have an upcoming appointment? Yes  Can we respond through MyChart? Yes  Agent: Please be advised that Rx refills may take up to 3 business days. We ask that you follow-up with your pharmacy.

## 2024-07-24 MED ORDER — ALPRAZOLAM 0.25 MG PO TABS: ORAL_TABLET | ORAL | 1 refills | Status: DC

## 2024-07-24 MED ORDER — HYDROCODONE-ACETAMINOPHEN 10-325 MG PO TABS
1.0000 | ORAL_TABLET | Freq: Three times a day (TID) | ORAL | 0 refills | Status: DC | PRN
Start: 2024-07-24 — End: 2024-08-26

## 2024-08-01 DIAGNOSIS — R0902 Hypoxemia: Secondary | ICD-10-CM | POA: Diagnosis not present

## 2024-08-01 DIAGNOSIS — S90511A Abrasion, right ankle, initial encounter: Secondary | ICD-10-CM | POA: Diagnosis not present

## 2024-08-01 DIAGNOSIS — J449 Chronic obstructive pulmonary disease, unspecified: Secondary | ICD-10-CM | POA: Diagnosis not present

## 2024-08-01 DIAGNOSIS — I739 Peripheral vascular disease, unspecified: Secondary | ICD-10-CM | POA: Diagnosis not present

## 2024-08-01 DIAGNOSIS — F1190 Opioid use, unspecified, uncomplicated: Secondary | ICD-10-CM | POA: Diagnosis not present

## 2024-08-01 DIAGNOSIS — F10129 Alcohol abuse with intoxication, unspecified: Secondary | ICD-10-CM | POA: Diagnosis not present

## 2024-08-01 DIAGNOSIS — R000 Tachycardia, unspecified: Secondary | ICD-10-CM | POA: Diagnosis not present

## 2024-08-01 DIAGNOSIS — N390 Urinary tract infection, site not specified: Secondary | ICD-10-CM | POA: Diagnosis not present

## 2024-08-01 DIAGNOSIS — F10920 Alcohol use, unspecified with intoxication, uncomplicated: Secondary | ICD-10-CM | POA: Diagnosis not present

## 2024-08-01 DIAGNOSIS — S80812A Abrasion, left lower leg, initial encounter: Secondary | ICD-10-CM | POA: Diagnosis not present

## 2024-08-01 DIAGNOSIS — S199XXA Unspecified injury of neck, initial encounter: Secondary | ICD-10-CM | POA: Diagnosis not present

## 2024-08-01 DIAGNOSIS — S0990XA Unspecified injury of head, initial encounter: Secondary | ICD-10-CM | POA: Diagnosis not present

## 2024-08-01 DIAGNOSIS — S80811A Abrasion, right lower leg, initial encounter: Secondary | ICD-10-CM | POA: Diagnosis not present

## 2024-08-08 ENCOUNTER — Telehealth: Payer: Self-pay | Admitting: Family Medicine

## 2024-08-08 NOTE — Telephone Encounter (Signed)
 Copied from CRM [id number]. Topic: Referral - Request for Referral >> Aug 07, 2024  5:05 PM Lauren C wrote: Did the patient discuss referral with their provider in the last year? No (If No - schedule appointment) (If Yes - send message)  Appointment offered? No  Type of order/referral and detailed reason for visit: New PCP due to relocation to [employer] of office, provider, location: No preference  If referral order, have you been seen by this specialty before? No (If Yes, this issue or another issue? When? Where?  Can we respond through MyChart? Yes

## 2024-08-09 ENCOUNTER — Other Ambulatory Visit: Payer: Self-pay | Admitting: Family Medicine

## 2024-08-09 DIAGNOSIS — F411 Generalized anxiety disorder: Secondary | ICD-10-CM

## 2024-08-09 DIAGNOSIS — F4321 Adjustment disorder with depressed mood: Secondary | ICD-10-CM

## 2024-08-09 MED ORDER — ALPRAZOLAM 0.25 MG PO TABS: ORAL_TABLET | ORAL | 1 refills | Status: AC

## 2024-08-09 NOTE — Telephone Encounter (Signed)
 Copied from CRM #[id number]. Topic: Clinical - Medication Refill >> Aug 09, 2024  1:21 PM Jonathan Harrington wrote: Medication: ALPRAZolam  (XANAX ) 0.25 MG tablet  Has the patient contacted their pharmacy? Yes (Agent: If no, request that the patient contact the pharmacy for the refill. If patient does not wish to contact the pharmacy document the reason why and proceed with request.) (Agent: If yes, when and what did the pharmacy advise?)  This is the patient's preferred pharmacy:    [facility] #[id number] - [place], [place] - [address] AT [facility] OF [facility] [address] [place] [address] Phone: [phone] Fax: [phone]  Is this the correct pharmacy for this prescription? Yes If no, delete pharmacy and type the correct one.   Has the prescription been filled recently? No  Is the patient out of the medication? Yes  Has the patient been seen for an appointment in the last year OR does the patient have an upcoming appointment? Yes  Can we respond through MyChart? Yes  Agent: Please be advised that Rx refills may take up to 3 business days. We ask that you follow-up with your pharmacy.

## 2024-08-26 ENCOUNTER — Other Ambulatory Visit: Payer: Self-pay | Admitting: Family Medicine

## 2024-08-26 DIAGNOSIS — G8929 Other chronic pain: Secondary | ICD-10-CM

## 2024-08-26 DIAGNOSIS — F5101 Primary insomnia: Secondary | ICD-10-CM

## 2024-08-26 DIAGNOSIS — G629 Polyneuropathy, unspecified: Secondary | ICD-10-CM

## 2024-08-26 DIAGNOSIS — I739 Peripheral vascular disease, unspecified: Secondary | ICD-10-CM

## 2024-08-26 MED ORDER — HYDROCODONE-ACETAMINOPHEN 10-325 MG PO TABS
1.0000 | ORAL_TABLET | Freq: Three times a day (TID) | ORAL | 0 refills | Status: AC | PRN
Start: 2024-08-26 — End: ?

## 2024-08-26 NOTE — Telephone Encounter (Signed)
 Copied from CRM #[id number]. Topic: Clinical - Medication Refill >> Aug 26, 2024 11:11 AM Wess RAMAN wrote: Medication: HYDROcodone -acetaminophen  (NORCO) 10-325 MG tablet   Has the patient contacted their pharmacy? No (Agent: If no, request that the patient contact the pharmacy for the refill. If patient does not wish to contact the pharmacy document the reason why and proceed with request.) (Agent: If yes, when and what did the pharmacy advise?)  This is the patient's preferred pharmacy:   [facility] #[id number] - [place], [place] - [address] AT [facility] OF [facility] [address] [place] [address] Phone: [phone] Fax: [phone]  Is this the correct pharmacy for this prescription? Yes If no, delete pharmacy and type the correct one.   Has the prescription been filled recently? Yes  Is the patient out of the medication? Yes  Has the patient been seen for an appointment in the last year OR does the patient have an upcoming appointment? Yes  Can we respond through MyChart? No. Patient prefers text  Agent: Please be advised that Rx refills may take up to 3 business days. We ask that you follow-up with your pharmacy.

## 2024-09-06 DIAGNOSIS — R0689 Other abnormalities of breathing: Secondary | ICD-10-CM | POA: Diagnosis not present

## 2024-09-06 DIAGNOSIS — D72819 Decreased white blood cell count, unspecified: Secondary | ICD-10-CM | POA: Diagnosis not present

## 2024-09-06 DIAGNOSIS — J811 Chronic pulmonary edema: Secondary | ICD-10-CM | POA: Diagnosis not present

## 2024-09-06 DIAGNOSIS — I509 Heart failure, unspecified: Secondary | ICD-10-CM | POA: Diagnosis not present

## 2024-09-06 DIAGNOSIS — R5381 Other malaise: Secondary | ICD-10-CM | POA: Diagnosis not present

## 2024-09-06 DIAGNOSIS — D649 Anemia, unspecified: Secondary | ICD-10-CM | POA: Diagnosis not present

## 2024-09-06 DIAGNOSIS — I82492 Acute embolism and thrombosis of other specified deep vein of left lower extremity: Secondary | ICD-10-CM | POA: Diagnosis not present

## 2024-09-06 DIAGNOSIS — R579 Shock, unspecified: Secondary | ICD-10-CM | POA: Diagnosis not present

## 2024-09-06 DIAGNOSIS — R0602 Shortness of breath: Secondary | ICD-10-CM | POA: Diagnosis not present

## 2024-09-06 DIAGNOSIS — R000 Tachycardia, unspecified: Secondary | ICD-10-CM | POA: Diagnosis not present

## 2024-09-06 DIAGNOSIS — I82461 Acute embolism and thrombosis of right calf muscular vein: Secondary | ICD-10-CM | POA: Diagnosis not present

## 2024-09-06 DIAGNOSIS — R160 Hepatomegaly, not elsewhere classified: Secondary | ICD-10-CM | POA: Diagnosis not present

## 2024-09-06 DIAGNOSIS — L89626 Pressure-induced deep tissue damage of left heel: Secondary | ICD-10-CM | POA: Diagnosis not present

## 2024-09-06 DIAGNOSIS — N179 Acute kidney failure, unspecified: Secondary | ICD-10-CM | POA: Diagnosis not present

## 2024-09-06 DIAGNOSIS — R6521 Severe sepsis with septic shock: Secondary | ICD-10-CM | POA: Diagnosis not present

## 2024-09-06 DIAGNOSIS — R630 Anorexia: Secondary | ICD-10-CM | POA: Diagnosis not present

## 2024-09-06 DIAGNOSIS — I959 Hypotension, unspecified: Secondary | ICD-10-CM | POA: Diagnosis not present

## 2024-09-06 DIAGNOSIS — E8770 Fluid overload, unspecified: Secondary | ICD-10-CM | POA: Diagnosis not present

## 2024-09-06 DIAGNOSIS — I82451 Acute embolism and thrombosis of right peroneal vein: Secondary | ICD-10-CM | POA: Diagnosis not present

## 2024-09-06 DIAGNOSIS — R059 Cough, unspecified: Secondary | ICD-10-CM | POA: Diagnosis not present

## 2024-09-06 DIAGNOSIS — R7401 Elevation of levels of liver transaminase levels: Secondary | ICD-10-CM | POA: Diagnosis not present

## 2024-09-06 DIAGNOSIS — D61818 Other pancytopenia: Secondary | ICD-10-CM | POA: Diagnosis not present

## 2024-09-06 DIAGNOSIS — I82401 Acute embolism and thrombosis of unspecified deep veins of right lower extremity: Secondary | ICD-10-CM | POA: Diagnosis not present

## 2024-09-06 DIAGNOSIS — I5023 Acute on chronic systolic (congestive) heart failure: Secondary | ICD-10-CM | POA: Diagnosis not present

## 2024-09-06 DIAGNOSIS — I82409 Acute embolism and thrombosis of unspecified deep veins of unspecified lower extremity: Secondary | ICD-10-CM | POA: Diagnosis not present

## 2024-09-06 DIAGNOSIS — E43 Unspecified severe protein-calorie malnutrition: Secondary | ICD-10-CM | POA: Diagnosis not present

## 2024-09-06 DIAGNOSIS — R7881 Bacteremia: Secondary | ICD-10-CM | POA: Diagnosis not present

## 2024-09-06 DIAGNOSIS — R531 Weakness: Secondary | ICD-10-CM | POA: Diagnosis not present

## 2024-09-06 DIAGNOSIS — D631 Anemia in chronic kidney disease: Secondary | ICD-10-CM | POA: Diagnosis not present

## 2024-09-06 DIAGNOSIS — F10931 Alcohol use, unspecified with withdrawal delirium: Secondary | ICD-10-CM | POA: Diagnosis not present

## 2024-09-06 DIAGNOSIS — I5A Non-ischemic myocardial injury (non-traumatic): Secondary | ICD-10-CM | POA: Diagnosis not present

## 2024-09-06 DIAGNOSIS — M6282 Rhabdomyolysis: Secondary | ICD-10-CM | POA: Diagnosis not present

## 2024-09-06 DIAGNOSIS — R109 Unspecified abdominal pain: Secondary | ICD-10-CM | POA: Diagnosis not present

## 2024-09-06 DIAGNOSIS — F10231 Alcohol dependence with withdrawal delirium: Secondary | ICD-10-CM | POA: Diagnosis not present

## 2024-09-06 DIAGNOSIS — I82569 Chronic embolism and thrombosis of unspecified calf muscular vein: Secondary | ICD-10-CM | POA: Diagnosis not present

## 2024-09-06 DIAGNOSIS — F1020 Alcohol dependence, uncomplicated: Secondary | ICD-10-CM | POA: Diagnosis not present

## 2024-09-06 DIAGNOSIS — J449 Chronic obstructive pulmonary disease, unspecified: Secondary | ICD-10-CM | POA: Diagnosis not present

## 2024-09-06 DIAGNOSIS — G9340 Encephalopathy, unspecified: Secondary | ICD-10-CM | POA: Diagnosis not present

## 2024-09-06 DIAGNOSIS — D638 Anemia in other chronic diseases classified elsewhere: Secondary | ICD-10-CM | POA: Diagnosis not present

## 2024-09-06 DIAGNOSIS — Z960 Presence of urogenital implants: Secondary | ICD-10-CM | POA: Diagnosis not present

## 2024-09-06 DIAGNOSIS — D696 Thrombocytopenia, unspecified: Secondary | ICD-10-CM | POA: Diagnosis not present

## 2024-09-06 DIAGNOSIS — A4159 Other Gram-negative sepsis: Secondary | ICD-10-CM | POA: Diagnosis not present

## 2024-09-06 DIAGNOSIS — N390 Urinary tract infection, site not specified: Secondary | ICD-10-CM | POA: Diagnosis not present

## 2024-09-06 DIAGNOSIS — I82491 Acute embolism and thrombosis of other specified deep vein of right lower extremity: Secondary | ICD-10-CM | POA: Diagnosis not present

## 2024-09-06 DIAGNOSIS — K7581 Nonalcoholic steatohepatitis (NASH): Secondary | ICD-10-CM | POA: Diagnosis not present

## 2024-09-06 DIAGNOSIS — I361 Nonrheumatic tricuspid (valve) insufficiency: Secondary | ICD-10-CM | POA: Diagnosis not present

## 2024-09-06 DIAGNOSIS — N170 Acute kidney failure with tubular necrosis: Secondary | ICD-10-CM | POA: Diagnosis not present

## 2024-09-06 DIAGNOSIS — Z792 Long term (current) use of antibiotics: Secondary | ICD-10-CM | POA: Diagnosis not present

## 2024-09-06 DIAGNOSIS — N3000 Acute cystitis without hematuria: Secondary | ICD-10-CM | POA: Diagnosis not present

## 2024-09-06 DIAGNOSIS — G9341 Metabolic encephalopathy: Secondary | ICD-10-CM | POA: Diagnosis not present

## 2024-09-06 DIAGNOSIS — D72829 Elevated white blood cell count, unspecified: Secondary | ICD-10-CM | POA: Diagnosis not present

## 2024-09-06 DIAGNOSIS — F321 Major depressive disorder, single episode, moderate: Secondary | ICD-10-CM | POA: Diagnosis not present

## 2024-09-06 DIAGNOSIS — K700 Alcoholic fatty liver: Secondary | ICD-10-CM | POA: Diagnosis not present

## 2024-09-06 DIAGNOSIS — R339 Retention of urine, unspecified: Secondary | ICD-10-CM | POA: Diagnosis not present

## 2024-09-06 DIAGNOSIS — F1090 Alcohol use, unspecified, uncomplicated: Secondary | ICD-10-CM | POA: Diagnosis not present

## 2024-09-06 DIAGNOSIS — F339 Major depressive disorder, recurrent, unspecified: Secondary | ICD-10-CM | POA: Diagnosis not present

## 2024-09-06 DIAGNOSIS — J90 Pleural effusion, not elsewhere classified: Secondary | ICD-10-CM | POA: Diagnosis not present

## 2024-09-06 DIAGNOSIS — A419 Sepsis, unspecified organism: Secondary | ICD-10-CM | POA: Diagnosis not present

## 2024-09-06 DIAGNOSIS — J439 Emphysema, unspecified: Secondary | ICD-10-CM | POA: Diagnosis not present

## 2024-09-06 DIAGNOSIS — F329 Major depressive disorder, single episode, unspecified: Secondary | ICD-10-CM | POA: Diagnosis not present

## 2024-09-06 DIAGNOSIS — E785 Hyperlipidemia, unspecified: Secondary | ICD-10-CM | POA: Diagnosis not present

## 2024-09-06 DIAGNOSIS — E872 Acidosis, unspecified: Secondary | ICD-10-CM | POA: Diagnosis not present

## 2024-09-06 DIAGNOSIS — R3912 Poor urinary stream: Secondary | ICD-10-CM | POA: Diagnosis not present

## 2024-09-06 DIAGNOSIS — R1312 Dysphagia, oropharyngeal phase: Secondary | ICD-10-CM | POA: Diagnosis not present

## 2024-09-06 DIAGNOSIS — Z7901 Long term (current) use of anticoagulants: Secondary | ICD-10-CM | POA: Diagnosis not present

## 2024-09-06 DIAGNOSIS — R410 Disorientation, unspecified: Secondary | ICD-10-CM | POA: Diagnosis not present

## 2024-09-06 DIAGNOSIS — I824Z1 Acute embolism and thrombosis of unspecified deep veins of right distal lower extremity: Secondary | ICD-10-CM | POA: Diagnosis not present

## 2024-09-06 DIAGNOSIS — R4182 Altered mental status, unspecified: Secondary | ICD-10-CM | POA: Diagnosis not present

## 2024-09-06 DIAGNOSIS — R54 Age-related physical debility: Secondary | ICD-10-CM | POA: Diagnosis not present

## 2024-09-06 DIAGNOSIS — K7200 Acute and subacute hepatic failure without coma: Secondary | ICD-10-CM | POA: Diagnosis not present

## 2024-09-06 DIAGNOSIS — N281 Cyst of kidney, acquired: Secondary | ICD-10-CM | POA: Diagnosis not present

## 2024-09-06 DIAGNOSIS — K7010 Alcoholic hepatitis without ascites: Secondary | ICD-10-CM | POA: Diagnosis not present

## 2024-09-06 DIAGNOSIS — L89616 Pressure-induced deep tissue damage of right heel: Secondary | ICD-10-CM | POA: Diagnosis not present

## 2024-09-06 DIAGNOSIS — Z79899 Other long term (current) drug therapy: Secondary | ICD-10-CM | POA: Diagnosis not present

## 2024-09-06 DIAGNOSIS — F05 Delirium due to known physiological condition: Secondary | ICD-10-CM | POA: Diagnosis not present

## 2024-09-06 DIAGNOSIS — N189 Chronic kidney disease, unspecified: Secondary | ICD-10-CM | POA: Diagnosis not present

## 2024-09-06 DIAGNOSIS — B952 Enterococcus as the cause of diseases classified elsewhere: Secondary | ICD-10-CM | POA: Diagnosis not present

## 2024-09-06 DIAGNOSIS — F32A Depression, unspecified: Secondary | ICD-10-CM | POA: Diagnosis not present

## 2024-09-06 DIAGNOSIS — D539 Nutritional anemia, unspecified: Secondary | ICD-10-CM | POA: Diagnosis not present

## 2024-09-06 DIAGNOSIS — I214 Non-ST elevation (NSTEMI) myocardial infarction: Secondary | ICD-10-CM | POA: Diagnosis not present

## 2024-09-06 DIAGNOSIS — N1832 Chronic kidney disease, stage 3b: Secondary | ICD-10-CM | POA: Diagnosis not present

## 2024-09-06 DIAGNOSIS — G928 Other toxic encephalopathy: Secondary | ICD-10-CM | POA: Diagnosis not present

## 2024-09-07 DIAGNOSIS — A419 Sepsis, unspecified organism: Secondary | ICD-10-CM | POA: Diagnosis not present

## 2024-09-07 DIAGNOSIS — N281 Cyst of kidney, acquired: Secondary | ICD-10-CM | POA: Diagnosis not present

## 2024-09-07 DIAGNOSIS — N390 Urinary tract infection, site not specified: Secondary | ICD-10-CM | POA: Diagnosis not present

## 2024-09-07 DIAGNOSIS — R6521 Severe sepsis with septic shock: Secondary | ICD-10-CM | POA: Diagnosis not present

## 2024-09-07 DIAGNOSIS — R109 Unspecified abdominal pain: Secondary | ICD-10-CM | POA: Diagnosis not present

## 2024-09-07 DIAGNOSIS — R531 Weakness: Secondary | ICD-10-CM | POA: Diagnosis not present

## 2024-09-07 DIAGNOSIS — R059 Cough, unspecified: Secondary | ICD-10-CM | POA: Diagnosis not present

## 2024-09-07 DIAGNOSIS — F05 Delirium due to known physiological condition: Secondary | ICD-10-CM | POA: Diagnosis not present

## 2024-09-07 DIAGNOSIS — R4182 Altered mental status, unspecified: Secondary | ICD-10-CM | POA: Diagnosis not present

## 2024-09-07 DIAGNOSIS — R0689 Other abnormalities of breathing: Secondary | ICD-10-CM | POA: Diagnosis not present

## 2024-09-07 DIAGNOSIS — F1020 Alcohol dependence, uncomplicated: Secondary | ICD-10-CM | POA: Diagnosis not present

## 2024-09-07 DIAGNOSIS — I214 Non-ST elevation (NSTEMI) myocardial infarction: Secondary | ICD-10-CM | POA: Diagnosis not present

## 2024-09-07 DIAGNOSIS — R000 Tachycardia, unspecified: Secondary | ICD-10-CM | POA: Diagnosis not present

## 2024-09-07 DIAGNOSIS — I361 Nonrheumatic tricuspid (valve) insufficiency: Secondary | ICD-10-CM | POA: Diagnosis not present

## 2024-09-07 DIAGNOSIS — R160 Hepatomegaly, not elsewhere classified: Secondary | ICD-10-CM | POA: Diagnosis not present

## 2024-09-08 DIAGNOSIS — N390 Urinary tract infection, site not specified: Secondary | ICD-10-CM | POA: Diagnosis not present

## 2024-09-08 DIAGNOSIS — R54 Age-related physical debility: Secondary | ICD-10-CM | POA: Diagnosis not present

## 2024-09-08 DIAGNOSIS — R7881 Bacteremia: Secondary | ICD-10-CM | POA: Diagnosis not present

## 2024-09-08 DIAGNOSIS — F10231 Alcohol dependence with withdrawal delirium: Secondary | ICD-10-CM | POA: Diagnosis not present

## 2024-09-08 DIAGNOSIS — I82409 Acute embolism and thrombosis of unspecified deep veins of unspecified lower extremity: Secondary | ICD-10-CM | POA: Diagnosis not present

## 2024-09-08 DIAGNOSIS — I82451 Acute embolism and thrombosis of right peroneal vein: Secondary | ICD-10-CM | POA: Diagnosis not present

## 2024-09-08 DIAGNOSIS — B952 Enterococcus as the cause of diseases classified elsewhere: Secondary | ICD-10-CM | POA: Diagnosis not present

## 2024-09-08 DIAGNOSIS — F10931 Alcohol use, unspecified with withdrawal delirium: Secondary | ICD-10-CM | POA: Diagnosis not present

## 2024-09-08 DIAGNOSIS — R7401 Elevation of levels of liver transaminase levels: Secondary | ICD-10-CM | POA: Diagnosis not present

## 2024-09-08 DIAGNOSIS — E872 Acidosis, unspecified: Secondary | ICD-10-CM | POA: Diagnosis not present

## 2024-09-08 DIAGNOSIS — R1312 Dysphagia, oropharyngeal phase: Secondary | ICD-10-CM | POA: Diagnosis not present

## 2024-09-08 DIAGNOSIS — N179 Acute kidney failure, unspecified: Secondary | ICD-10-CM | POA: Diagnosis not present

## 2024-09-08 DIAGNOSIS — G9340 Encephalopathy, unspecified: Secondary | ICD-10-CM | POA: Diagnosis not present

## 2024-09-09 DIAGNOSIS — F1020 Alcohol dependence, uncomplicated: Secondary | ICD-10-CM | POA: Diagnosis not present

## 2024-09-09 DIAGNOSIS — R1312 Dysphagia, oropharyngeal phase: Secondary | ICD-10-CM | POA: Diagnosis not present

## 2024-09-09 DIAGNOSIS — B952 Enterococcus as the cause of diseases classified elsewhere: Secondary | ICD-10-CM | POA: Diagnosis not present

## 2024-09-09 DIAGNOSIS — F10231 Alcohol dependence with withdrawal delirium: Secondary | ICD-10-CM | POA: Diagnosis not present

## 2024-09-09 DIAGNOSIS — N179 Acute kidney failure, unspecified: Secondary | ICD-10-CM | POA: Diagnosis not present

## 2024-09-09 DIAGNOSIS — J449 Chronic obstructive pulmonary disease, unspecified: Secondary | ICD-10-CM | POA: Diagnosis not present

## 2024-09-09 DIAGNOSIS — R3912 Poor urinary stream: Secondary | ICD-10-CM | POA: Diagnosis not present

## 2024-09-09 DIAGNOSIS — N390 Urinary tract infection, site not specified: Secondary | ICD-10-CM | POA: Diagnosis not present

## 2024-09-09 DIAGNOSIS — N3000 Acute cystitis without hematuria: Secondary | ICD-10-CM | POA: Diagnosis not present

## 2024-09-09 DIAGNOSIS — G9340 Encephalopathy, unspecified: Secondary | ICD-10-CM | POA: Diagnosis not present

## 2024-09-09 DIAGNOSIS — R6521 Severe sepsis with septic shock: Secondary | ICD-10-CM | POA: Diagnosis not present

## 2024-09-09 DIAGNOSIS — R7881 Bacteremia: Secondary | ICD-10-CM | POA: Diagnosis not present

## 2024-09-09 DIAGNOSIS — I82491 Acute embolism and thrombosis of other specified deep vein of right lower extremity: Secondary | ICD-10-CM | POA: Diagnosis not present

## 2024-09-09 DIAGNOSIS — M6282 Rhabdomyolysis: Secondary | ICD-10-CM | POA: Diagnosis not present

## 2024-09-09 DIAGNOSIS — R579 Shock, unspecified: Secondary | ICD-10-CM | POA: Diagnosis not present

## 2024-09-09 DIAGNOSIS — F05 Delirium due to known physiological condition: Secondary | ICD-10-CM | POA: Diagnosis not present

## 2024-09-09 DIAGNOSIS — A419 Sepsis, unspecified organism: Secondary | ICD-10-CM | POA: Diagnosis not present

## 2024-09-10 DIAGNOSIS — N179 Acute kidney failure, unspecified: Secondary | ICD-10-CM | POA: Diagnosis not present

## 2024-09-10 DIAGNOSIS — R579 Shock, unspecified: Secondary | ICD-10-CM | POA: Diagnosis not present

## 2024-09-10 DIAGNOSIS — R3912 Poor urinary stream: Secondary | ICD-10-CM | POA: Diagnosis not present

## 2024-09-10 DIAGNOSIS — J811 Chronic pulmonary edema: Secondary | ICD-10-CM | POA: Diagnosis not present

## 2024-09-10 DIAGNOSIS — F05 Delirium due to known physiological condition: Secondary | ICD-10-CM | POA: Diagnosis not present

## 2024-09-10 DIAGNOSIS — F1020 Alcohol dependence, uncomplicated: Secondary | ICD-10-CM | POA: Diagnosis not present

## 2024-09-10 DIAGNOSIS — K700 Alcoholic fatty liver: Secondary | ICD-10-CM | POA: Diagnosis not present

## 2024-09-10 DIAGNOSIS — R6521 Severe sepsis with septic shock: Secondary | ICD-10-CM | POA: Diagnosis not present

## 2024-09-10 DIAGNOSIS — G9341 Metabolic encephalopathy: Secondary | ICD-10-CM | POA: Diagnosis not present

## 2024-09-10 DIAGNOSIS — Z792 Long term (current) use of antibiotics: Secondary | ICD-10-CM | POA: Diagnosis not present

## 2024-09-10 DIAGNOSIS — N390 Urinary tract infection, site not specified: Secondary | ICD-10-CM | POA: Diagnosis not present

## 2024-09-10 DIAGNOSIS — M6282 Rhabdomyolysis: Secondary | ICD-10-CM | POA: Diagnosis not present

## 2024-09-10 DIAGNOSIS — R0602 Shortness of breath: Secondary | ICD-10-CM | POA: Diagnosis not present

## 2024-09-10 DIAGNOSIS — N170 Acute kidney failure with tubular necrosis: Secondary | ICD-10-CM | POA: Diagnosis not present

## 2024-09-10 DIAGNOSIS — D696 Thrombocytopenia, unspecified: Secondary | ICD-10-CM | POA: Diagnosis not present

## 2024-09-10 DIAGNOSIS — J90 Pleural effusion, not elsewhere classified: Secondary | ICD-10-CM | POA: Diagnosis not present

## 2024-09-10 DIAGNOSIS — A419 Sepsis, unspecified organism: Secondary | ICD-10-CM | POA: Diagnosis not present

## 2024-09-10 DIAGNOSIS — B952 Enterococcus as the cause of diseases classified elsewhere: Secondary | ICD-10-CM | POA: Diagnosis not present

## 2024-09-10 DIAGNOSIS — R5381 Other malaise: Secondary | ICD-10-CM | POA: Diagnosis not present

## 2024-09-11 DIAGNOSIS — R339 Retention of urine, unspecified: Secondary | ICD-10-CM | POA: Diagnosis not present

## 2024-09-11 DIAGNOSIS — N390 Urinary tract infection, site not specified: Secondary | ICD-10-CM | POA: Diagnosis not present

## 2024-09-11 DIAGNOSIS — D696 Thrombocytopenia, unspecified: Secondary | ICD-10-CM | POA: Diagnosis not present

## 2024-09-11 DIAGNOSIS — R7881 Bacteremia: Secondary | ICD-10-CM | POA: Diagnosis not present

## 2024-09-11 DIAGNOSIS — G9341 Metabolic encephalopathy: Secondary | ICD-10-CM | POA: Diagnosis not present

## 2024-09-11 DIAGNOSIS — I509 Heart failure, unspecified: Secondary | ICD-10-CM | POA: Diagnosis not present

## 2024-09-11 DIAGNOSIS — R630 Anorexia: Secondary | ICD-10-CM | POA: Diagnosis not present

## 2024-09-11 DIAGNOSIS — R579 Shock, unspecified: Secondary | ICD-10-CM | POA: Diagnosis not present

## 2024-09-11 DIAGNOSIS — M6282 Rhabdomyolysis: Secondary | ICD-10-CM | POA: Diagnosis not present

## 2024-09-11 DIAGNOSIS — K7581 Nonalcoholic steatohepatitis (NASH): Secondary | ICD-10-CM | POA: Diagnosis not present

## 2024-09-11 DIAGNOSIS — N179 Acute kidney failure, unspecified: Secondary | ICD-10-CM | POA: Diagnosis not present

## 2024-09-11 DIAGNOSIS — Z79899 Other long term (current) drug therapy: Secondary | ICD-10-CM | POA: Diagnosis not present

## 2024-09-11 DIAGNOSIS — I824Z1 Acute embolism and thrombosis of unspecified deep veins of right distal lower extremity: Secondary | ICD-10-CM | POA: Diagnosis not present

## 2024-09-11 DIAGNOSIS — B952 Enterococcus as the cause of diseases classified elsewhere: Secondary | ICD-10-CM | POA: Diagnosis not present

## 2024-09-12 DIAGNOSIS — F1020 Alcohol dependence, uncomplicated: Secondary | ICD-10-CM | POA: Diagnosis not present

## 2024-09-12 DIAGNOSIS — G9341 Metabolic encephalopathy: Secondary | ICD-10-CM | POA: Diagnosis not present

## 2024-09-12 DIAGNOSIS — R579 Shock, unspecified: Secondary | ICD-10-CM | POA: Diagnosis not present

## 2024-09-12 DIAGNOSIS — I82569 Chronic embolism and thrombosis of unspecified calf muscular vein: Secondary | ICD-10-CM | POA: Diagnosis not present

## 2024-09-12 DIAGNOSIS — R410 Disorientation, unspecified: Secondary | ICD-10-CM | POA: Diagnosis not present

## 2024-09-12 DIAGNOSIS — K700 Alcoholic fatty liver: Secondary | ICD-10-CM | POA: Diagnosis not present

## 2024-09-12 DIAGNOSIS — R339 Retention of urine, unspecified: Secondary | ICD-10-CM | POA: Diagnosis not present

## 2024-09-12 DIAGNOSIS — M6282 Rhabdomyolysis: Secondary | ICD-10-CM | POA: Diagnosis not present

## 2024-09-12 DIAGNOSIS — N179 Acute kidney failure, unspecified: Secondary | ICD-10-CM | POA: Diagnosis not present

## 2024-09-12 DIAGNOSIS — Z79899 Other long term (current) drug therapy: Secondary | ICD-10-CM | POA: Diagnosis not present

## 2024-09-12 DIAGNOSIS — D649 Anemia, unspecified: Secondary | ICD-10-CM | POA: Diagnosis not present

## 2024-09-12 DIAGNOSIS — E43 Unspecified severe protein-calorie malnutrition: Secondary | ICD-10-CM | POA: Diagnosis not present

## 2024-09-12 DIAGNOSIS — E8770 Fluid overload, unspecified: Secondary | ICD-10-CM | POA: Diagnosis not present

## 2024-09-13 DIAGNOSIS — Z7901 Long term (current) use of anticoagulants: Secondary | ICD-10-CM | POA: Diagnosis not present

## 2024-09-13 DIAGNOSIS — R410 Disorientation, unspecified: Secondary | ICD-10-CM | POA: Diagnosis not present

## 2024-09-13 DIAGNOSIS — G9341 Metabolic encephalopathy: Secondary | ICD-10-CM | POA: Diagnosis not present

## 2024-09-13 DIAGNOSIS — R579 Shock, unspecified: Secondary | ICD-10-CM | POA: Diagnosis not present

## 2024-09-13 DIAGNOSIS — I82492 Acute embolism and thrombosis of other specified deep vein of left lower extremity: Secondary | ICD-10-CM | POA: Diagnosis not present

## 2024-09-13 DIAGNOSIS — D649 Anemia, unspecified: Secondary | ICD-10-CM | POA: Diagnosis not present

## 2024-09-13 DIAGNOSIS — N179 Acute kidney failure, unspecified: Secondary | ICD-10-CM | POA: Diagnosis not present

## 2024-09-13 DIAGNOSIS — M6282 Rhabdomyolysis: Secondary | ICD-10-CM | POA: Diagnosis not present

## 2024-09-13 DIAGNOSIS — E43 Unspecified severe protein-calorie malnutrition: Secondary | ICD-10-CM | POA: Diagnosis not present

## 2024-09-13 DIAGNOSIS — Z79899 Other long term (current) drug therapy: Secondary | ICD-10-CM | POA: Diagnosis not present

## 2024-09-13 DIAGNOSIS — D696 Thrombocytopenia, unspecified: Secondary | ICD-10-CM | POA: Diagnosis not present

## 2024-09-14 DIAGNOSIS — R579 Shock, unspecified: Secondary | ICD-10-CM | POA: Diagnosis not present

## 2024-09-14 DIAGNOSIS — N179 Acute kidney failure, unspecified: Secondary | ICD-10-CM | POA: Diagnosis not present

## 2024-09-14 DIAGNOSIS — M6282 Rhabdomyolysis: Secondary | ICD-10-CM | POA: Diagnosis not present

## 2024-09-15 DIAGNOSIS — N179 Acute kidney failure, unspecified: Secondary | ICD-10-CM | POA: Diagnosis not present

## 2024-09-15 DIAGNOSIS — M6282 Rhabdomyolysis: Secondary | ICD-10-CM | POA: Diagnosis not present

## 2024-09-15 DIAGNOSIS — R579 Shock, unspecified: Secondary | ICD-10-CM | POA: Diagnosis not present

## 2024-09-16 DIAGNOSIS — N179 Acute kidney failure, unspecified: Secondary | ICD-10-CM | POA: Diagnosis not present

## 2024-09-16 DIAGNOSIS — R579 Shock, unspecified: Secondary | ICD-10-CM | POA: Diagnosis not present

## 2024-09-16 DIAGNOSIS — M6282 Rhabdomyolysis: Secondary | ICD-10-CM | POA: Diagnosis not present

## 2024-09-17 DIAGNOSIS — M6282 Rhabdomyolysis: Secondary | ICD-10-CM | POA: Diagnosis not present

## 2024-09-17 DIAGNOSIS — N179 Acute kidney failure, unspecified: Secondary | ICD-10-CM | POA: Diagnosis not present

## 2024-09-17 DIAGNOSIS — R579 Shock, unspecified: Secondary | ICD-10-CM | POA: Diagnosis not present

## 2024-10-01 ENCOUNTER — Telehealth: Payer: Self-pay | Admitting: Family Medicine

## 2024-10-01 NOTE — Telephone Encounter (Signed)
 Called her back and thanked her for letting me know.  I also had not realized he was in the ER at [facility] in August with acute alcohol intoxication.  He is going to be transferred to rehab after his current admission per GF report.  I will need to stop rx controlled substances for him due to substance abuse, will address with him once he is done with rehab

## 2024-10-01 NOTE — Telephone Encounter (Signed)
 Copied from CRM #[id number]. Topic: General - Call Back - No Documentation >> Oct 01, 2024 11:06 AM Rea BROCKS wrote: Reason for CRM: Patient's gf Sonny Carney wanted to let Dr. Watt know that patient is in hospital. Don't see her listed in DPR, his son is listed, but she stated that Dr.[name] knows her and asked for a call back about patient being in the hospital.   [phone] (M)

## 2024-10-06 DIAGNOSIS — J449 Chronic obstructive pulmonary disease, unspecified: Secondary | ICD-10-CM | POA: Diagnosis not present

## 2024-10-06 DIAGNOSIS — N183 Chronic kidney disease, stage 3 unspecified: Secondary | ICD-10-CM | POA: Diagnosis not present

## 2024-10-06 DIAGNOSIS — R531 Weakness: Secondary | ICD-10-CM | POA: Diagnosis not present

## 2024-10-06 DIAGNOSIS — D61818 Other pancytopenia: Secondary | ICD-10-CM | POA: Diagnosis not present

## 2024-10-06 DIAGNOSIS — D631 Anemia in chronic kidney disease: Secondary | ICD-10-CM | POA: Diagnosis not present

## 2024-10-06 DIAGNOSIS — Z7901 Long term (current) use of anticoagulants: Secondary | ICD-10-CM | POA: Diagnosis not present

## 2024-10-06 DIAGNOSIS — F1090 Alcohol use, unspecified, uncomplicated: Secondary | ICD-10-CM | POA: Diagnosis not present

## 2024-10-06 DIAGNOSIS — E7800 Pure hypercholesterolemia, unspecified: Secondary | ICD-10-CM | POA: Diagnosis not present

## 2024-10-06 DIAGNOSIS — I5023 Acute on chronic systolic (congestive) heart failure: Secondary | ICD-10-CM | POA: Diagnosis not present

## 2024-10-06 DIAGNOSIS — I672 Cerebral atherosclerosis: Secondary | ICD-10-CM | POA: Diagnosis not present

## 2024-10-10 DIAGNOSIS — M87051 Idiopathic aseptic necrosis of right femur: Secondary | ICD-10-CM | POA: Diagnosis not present

## 2024-10-10 DIAGNOSIS — M25551 Pain in right hip: Secondary | ICD-10-CM | POA: Diagnosis not present

## 2024-10-10 DIAGNOSIS — Z79899 Other long term (current) drug therapy: Secondary | ICD-10-CM | POA: Diagnosis not present

## 2024-10-10 DIAGNOSIS — I82401 Acute embolism and thrombosis of unspecified deep veins of right lower extremity: Secondary | ICD-10-CM | POA: Diagnosis not present

## 2024-10-10 DIAGNOSIS — F1010 Alcohol abuse, uncomplicated: Secondary | ICD-10-CM | POA: Diagnosis not present

## 2024-10-10 DIAGNOSIS — N400 Enlarged prostate without lower urinary tract symptoms: Secondary | ICD-10-CM | POA: Diagnosis not present

## 2024-10-10 DIAGNOSIS — M87851 Other osteonecrosis, right femur: Secondary | ICD-10-CM | POA: Diagnosis not present

## 2024-10-10 DIAGNOSIS — F411 Generalized anxiety disorder: Secondary | ICD-10-CM | POA: Diagnosis not present

## 2024-10-10 DIAGNOSIS — E861 Hypovolemia: Secondary | ICD-10-CM | POA: Diagnosis not present

## 2024-10-10 DIAGNOSIS — N390 Urinary tract infection, site not specified: Secondary | ICD-10-CM | POA: Diagnosis not present

## 2024-10-10 DIAGNOSIS — N401 Enlarged prostate with lower urinary tract symptoms: Secondary | ICD-10-CM | POA: Diagnosis not present

## 2024-10-10 DIAGNOSIS — M87052 Idiopathic aseptic necrosis of left femur: Secondary | ICD-10-CM | POA: Diagnosis not present

## 2024-10-10 DIAGNOSIS — F17210 Nicotine dependence, cigarettes, uncomplicated: Secondary | ICD-10-CM | POA: Diagnosis not present

## 2024-10-10 DIAGNOSIS — B9689 Other specified bacterial agents as the cause of diseases classified elsewhere: Secondary | ICD-10-CM | POA: Diagnosis not present

## 2024-10-10 DIAGNOSIS — F17200 Nicotine dependence, unspecified, uncomplicated: Secondary | ICD-10-CM | POA: Diagnosis not present

## 2024-10-10 DIAGNOSIS — D6959 Other secondary thrombocytopenia: Secondary | ICD-10-CM | POA: Diagnosis not present

## 2024-10-10 DIAGNOSIS — F10929 Alcohol use, unspecified with intoxication, unspecified: Secondary | ICD-10-CM | POA: Diagnosis not present

## 2024-10-10 DIAGNOSIS — A499 Bacterial infection, unspecified: Secondary | ICD-10-CM | POA: Diagnosis not present

## 2024-10-10 DIAGNOSIS — S76011D Strain of muscle, fascia and tendon of right hip, subsequent encounter: Secondary | ICD-10-CM | POA: Diagnosis not present

## 2024-10-10 DIAGNOSIS — E860 Dehydration: Secondary | ICD-10-CM | POA: Diagnosis not present

## 2024-10-10 DIAGNOSIS — S76011A Strain of muscle, fascia and tendon of right hip, initial encounter: Secondary | ICD-10-CM | POA: Diagnosis not present

## 2024-10-10 DIAGNOSIS — F10139 Alcohol abuse with withdrawal, unspecified: Secondary | ICD-10-CM | POA: Diagnosis not present

## 2024-10-10 DIAGNOSIS — E869 Volume depletion, unspecified: Secondary | ICD-10-CM | POA: Diagnosis not present

## 2024-10-10 DIAGNOSIS — E8770 Fluid overload, unspecified: Secondary | ICD-10-CM | POA: Diagnosis not present

## 2024-10-10 DIAGNOSIS — N1832 Chronic kidney disease, stage 3b: Secondary | ICD-10-CM | POA: Diagnosis not present

## 2024-10-10 DIAGNOSIS — D696 Thrombocytopenia, unspecified: Secondary | ICD-10-CM | POA: Diagnosis not present

## 2024-10-10 DIAGNOSIS — R600 Localized edema: Secondary | ICD-10-CM | POA: Diagnosis not present

## 2024-10-10 DIAGNOSIS — J449 Chronic obstructive pulmonary disease, unspecified: Secondary | ICD-10-CM | POA: Diagnosis not present

## 2024-10-10 DIAGNOSIS — R531 Weakness: Secondary | ICD-10-CM | POA: Diagnosis not present

## 2024-10-10 DIAGNOSIS — M87852 Other osteonecrosis, left femur: Secondary | ICD-10-CM | POA: Diagnosis not present

## 2024-10-10 DIAGNOSIS — R5383 Other fatigue: Secondary | ICD-10-CM | POA: Diagnosis not present

## 2024-10-10 DIAGNOSIS — E872 Acidosis, unspecified: Secondary | ICD-10-CM | POA: Diagnosis not present

## 2024-10-10 DIAGNOSIS — J438 Other emphysema: Secondary | ICD-10-CM | POA: Diagnosis not present

## 2024-10-10 DIAGNOSIS — D631 Anemia in chronic kidney disease: Secondary | ICD-10-CM | POA: Diagnosis not present

## 2024-10-10 DIAGNOSIS — Z720 Tobacco use: Secondary | ICD-10-CM | POA: Diagnosis not present

## 2024-10-10 DIAGNOSIS — I82461 Acute embolism and thrombosis of right calf muscular vein: Secondary | ICD-10-CM | POA: Diagnosis not present

## 2024-10-14 ENCOUNTER — Telehealth: Payer: Self-pay

## 2024-10-14 NOTE — Telephone Encounter (Signed)
 Copied from CRM [id number]. Topic: Clinical - Medication Question >> Oct 14, 2024 10:34 AM Thersia BROCKS wrote: Reason for CRM: allie humana called in regarding patient  had alert that he has COPD but not on a daily inhaler, looking to see if that is something that are looking to add    [id number]

## 2024-10-15 ENCOUNTER — Telehealth: Payer: Self-pay | Admitting: Family Medicine

## 2024-10-15 NOTE — Telephone Encounter (Signed)
 Copied from CRM [id number]. Topic: Medicare AWV >> Oct 15, 2024 10:53 AM Nathanel DEL wrote: Reason for CRM: Called 10/15/2024 to sched AWV - MAILBOX FULL  Nathanel Paschal; Care Guide Ambulatory Clinical Support [facility] Direct Dial: [phone]

## 2024-10-16 DIAGNOSIS — N390 Urinary tract infection, site not specified: Secondary | ICD-10-CM | POA: Diagnosis not present

## 2024-10-16 DIAGNOSIS — I82461 Acute embolism and thrombosis of right calf muscular vein: Secondary | ICD-10-CM | POA: Diagnosis not present

## 2024-10-16 DIAGNOSIS — M87852 Other osteonecrosis, left femur: Secondary | ICD-10-CM | POA: Diagnosis not present

## 2024-10-16 DIAGNOSIS — F411 Generalized anxiety disorder: Secondary | ICD-10-CM | POA: Diagnosis not present

## 2024-10-16 DIAGNOSIS — F17210 Nicotine dependence, cigarettes, uncomplicated: Secondary | ICD-10-CM | POA: Diagnosis not present

## 2024-10-16 DIAGNOSIS — J438 Other emphysema: Secondary | ICD-10-CM | POA: Diagnosis not present

## 2024-10-16 DIAGNOSIS — D631 Anemia in chronic kidney disease: Secondary | ICD-10-CM | POA: Diagnosis not present

## 2024-10-16 DIAGNOSIS — M87851 Other osteonecrosis, right femur: Secondary | ICD-10-CM | POA: Diagnosis not present

## 2024-10-16 DIAGNOSIS — S76011D Strain of muscle, fascia and tendon of right hip, subsequent encounter: Secondary | ICD-10-CM | POA: Diagnosis not present

## 2024-10-16 DIAGNOSIS — N401 Enlarged prostate with lower urinary tract symptoms: Secondary | ICD-10-CM | POA: Diagnosis not present

## 2024-10-16 DIAGNOSIS — F1010 Alcohol abuse, uncomplicated: Secondary | ICD-10-CM | POA: Diagnosis not present

## 2024-10-16 DIAGNOSIS — N1832 Chronic kidney disease, stage 3b: Secondary | ICD-10-CM | POA: Diagnosis not present

## 2024-10-17 DIAGNOSIS — S76011D Strain of muscle, fascia and tendon of right hip, subsequent encounter: Secondary | ICD-10-CM | POA: Diagnosis not present

## 2024-10-22 ENCOUNTER — Ambulatory Visit: Payer: Self-pay | Admitting: Family Medicine

## 2024-10-22 ENCOUNTER — Other Ambulatory Visit: Payer: Self-pay | Admitting: Family Medicine

## 2024-10-22 ENCOUNTER — Telehealth: Payer: Self-pay

## 2024-10-22 DIAGNOSIS — F4321 Adjustment disorder with depressed mood: Secondary | ICD-10-CM

## 2024-10-22 DIAGNOSIS — G8929 Other chronic pain: Secondary | ICD-10-CM

## 2024-10-22 DIAGNOSIS — I739 Peripheral vascular disease, unspecified: Secondary | ICD-10-CM

## 2024-10-22 DIAGNOSIS — G629 Polyneuropathy, unspecified: Secondary | ICD-10-CM

## 2024-10-22 DIAGNOSIS — F411 Generalized anxiety disorder: Secondary | ICD-10-CM

## 2024-10-22 NOTE — Telephone Encounter (Signed)
 Pt states that the pain in his back and hips are d/t the arthritis and missing his pain medication dosing. Pt states that he just moved to his sons house and with everything he forgot to order the refills. Pt also states that he has not had xanax  for 2 days and the shaking is normal if he misses those doses.  Refills sent HP to clinic.   Copied from CRM [id number]. Topic: Clinical - Red Word Triage >> Oct 22, 2024  9:46 AM Roselie BROCKS wrote: [facility] that prompted transfer to Nurse Triage: Patient called in for meds refill, then mentioned he is having pain in his hips and back, and has the shakes.

## 2024-10-22 NOTE — Telephone Encounter (Signed)
 Copied from CRM #[id number]. Topic: Clinical - Medication Refill >> Oct 22, 2024  9:43 AM Roselie BROCKS wrote: Medication: HYDROcodone -acetaminophen  (NORCO) 10-325 MG tablet ALPRAZolam  (XANAX ) 0.25 MG tablet  Has the patient contacted their pharmacy? No (Agent: If no, request that the patient contact the pharmacy for the refill. If patient does not wish to contact the pharmacy document the reason why and proceed with request.) (Agent: If yes, when and what did the pharmacy advise?)  This is the patient's preferred pharmacy:  [facility] #[id number] - [place], [place] - [address] AT [facility] OF [facility] [address] [place] [address] Phone: [phone] Fax: [phone]  Is this the correct pharmacy for this prescription? Yes If no, delete pharmacy and type the correct one.   Has the prescription been filled recently? No  Is the patient out of the medication? Yes  Has the patient been seen for an appointment in the last year OR does the patient have an upcoming appointment? Yes  Can we respond through MyChart? No  Agent: Please be advised that Rx refills may take up to 3 business days. We ask that you follow-up with your pharmacy.

## 2024-10-22 NOTE — Transitions of Care (Post Inpatient/ED Visit) (Signed)
 10/22/2024  Name: Jonathan Harrington MRN: [id number] DOB: [birth year]  Today's TOC FU Call Status: Today's TOC FU Call Status:: Successful TOC FU Call Completed TOC FU Call Complete Date: 10/22/24  Patient's Name and Date of Birth confirmed. Name, DOB  Transition Care Management Follow-up Telephone Call Date of Discharge: 10/21/24 Discharge Facility: Other ([facility]) Name of Other ([facility]) Discharge Facility: [facility] Type of Discharge: Inpatient Admission Primary Inpatient Discharge Diagnosis:: UTI How have you been since you were released from the hospital?: Better Any questions or concerns?: No  Items Reviewed: Did you receive and understand the discharge instructions provided?: Yes Medications obtained,verified, and reconciled?: Yes (Medications Reviewed) Any new allergies since your discharge?: No Dietary orders reviewed?: Yes Do you have support at home?: Yes People in Home [RPT]: child(ren), adult  Medications Reviewed Today: Medications Reviewed Today     Reviewed by Emmitt Pan, LPN (Licensed Practical Nurse) on 10/22/24 at 1054  Med List Status: <None>   Medication Order Taking? Sig Documenting Provider Last Dose Status Informant  albuterol  (VENTOLIN  HFA) 108 (90 Base) MCG/ACT inhaler [id number] Yes Inhale 2 puffs into the lungs every 6 (six) hours as needed for wheezing or shortness of breath. [name], Harlene BROCKS, MD  Active   allopurinol  (ZYLOPRIM ) 300 MG tablet [id number] Yes TAKE 1 TABLET(300 MG) BY MOUTH DAILY [name], Jessica C, MD  Active   ALPRAZolam  (XANAX ) 0.25 MG tablet [id number] Yes TAKE 1 TABLET BY MOUTH IN THE MORNING AND AN ADDITONAL TABLET DURING THE DAY OR EVENING AS NEEDED FOR ANXIETY [name], Jessica C, MD  Active   Cholecalciferol (VITAMIN D3) 25 MCG (1000 UT) CAPS [id number] Yes Take 1,000 Units by mouth daily. [provider]  Active Self  gabapentin  (NEURONTIN ) 300 MG capsule [id number] Yes Take 3 capsules (900 mg total) by  mouth 3 (three) times daily. Currieo, Andrew D, MD  Active   HYDROcodone -acetaminophen  [facility]) 10-325 MG tablet [id number] Yes Take 1 tablet by mouth every 8 (eight) hours as needed. Visit needed for further refills [name], Jessica C, MD  Active   Melatonin 10 MG TABS [id number] Yes Take 10 mg by mouth at bedtime. [provider]  Active Self  rosuvastatin  (CRESTOR ) 20 MG tablet [id number] Yes TAKE 1 TABLET(20 MG) BY MOUTH DAILY [name], Jessica C, MD  Active   traZODone  (DESYREL ) 50 MG tablet [id number] Yes Take 0.5-1 tablets (25-50 mg total) by mouth at bedtime as needed for sleep. Needs appt [name], Harlene BROCKS, MD  Active   triamcinolone  cream (KENALOG ) 0.1 % [id number] Yes Apply 1 Application topically 2 (two) times daily as needed (eczema on legs and ankles). [name], Harlene BROCKS, MD  Active   vitamin B-12 (CYANOCOBALAMIN ) 1000 MCG tablet [id number] Yes Take 1,000 mcg by mouth daily. [provider]  Active Self            Home Care and Equipment/Supplies: Were Home Health Services Ordered?: NA Any new equipment or medical supplies ordered?: NA  Functional Questionnaire: Do you need assistance with bathing/showering or dressing?: No Do you need assistance with meal preparation?: No Do you need assistance with eating?: No Do you have difficulty maintaining continence: No Do you need assistance with getting out of bed/getting out of a chair/moving?: No Do you have difficulty managing or taking your medications?: No  Follow up appointments reviewed: PCP Follow-up appointment confirmed?: Yes Date of PCP follow-up appointment?: 10/28/24 Follow-up Provider: [facility] Follow-up appointment confirmed?: NA Do you need transportation to your follow-up appointment?: No Do you  understand care options if your condition(s) worsen?: Yes-patient verbalized understanding    SIGNATURE Julian Lemmings, LPN [facility] Nurse Health Advisor Direct Dial [phone]

## 2024-10-22 NOTE — Telephone Encounter (Signed)
 Received message from patient unfortunately when he was admitted from 10/30 through 11/5 at [facility] with complicated UTI he was also found to be abusing alcohol  He was also previously admitted from 9/26 through 10/22 at [facility] in [place]-he was extremely ill with septic shock and multiorgan failure  He was also seen in the ER at [facility] on 8/21 with acute alcohol intoxication, fall and abrasions  I have not seen in the office since March He last filled hydrocodone  from me 2 months ago, 9/15 hydrocodone  10 #75 He last filled alprazolam  for me 9/11, 0.25 mg #45  I called patient, he is currently staying at his son's house.  Apparently he was in the hospital for some time and then was in rehab for a few days prior to leaving yesterday.  He checked himself out of rehab and per his son's report started drinking again right away when he got out.  He was however sober for 2 to 3 weeks prior so I do not think he is in danger of acute withdrawal symptoms at this time.  Advised that I am not going to prescribe any controlled substances for patient and he states understanding.  He states there is nothing else I can do to help him right now.  I have encouraged him to stop drinking due to dangers to his health

## 2024-10-26 NOTE — Progress Notes (Deleted)
 [facility] [address], [address] [place], [place] [address] [phone]  Date:  10/28/2024   Name:  Jonathan Harrington   DOB:  [birth year]   MRN:  [id number]  PCP:  Watt Harlene BROCKS, MD    Chief Complaint: No chief complaint on file.   History of Present Illness:  Jonathan Harrington is a 68 y.o. very pleasant male patient who presents with the following:  Patient seen today for follow-up-I saw him most recently in March History of anxiety, diverticulitis, neuropathy, hypertension, chronic joint pain, heavy tobacco use, COPD, dyslipidemia   Received message from patient unfortunately when he was admitted from 10/30 through 11/5 at [facility] with complicated UTI he was also found to be abusing alcohol He was also previously admitted from 9/26 through 10/22 at [facility] in -he was extremely ill with septic shock and multiorgan failure He was also seen in the ER at [facility] on 8/21 with acute alcohol intoxication, fall and abrasions I have not seen in the office since March He last filled hydrocodone  from me 2 months ago, 9/15 hydrocodone  10 #75 He last filled alprazolam  feom me 9/11, 0.25 mg #45 I called patient, he is currently staying at his son's house.  Apparently he was in the hospital for some time and then was in rehab for a few days prior to leaving yesterday.  He checked himself out of rehab and per his son's report started drinking again right away when he got out.  He was however sober for 2 to 3 weeks prior so I do not think he is in danger of acute withdrawal symptoms at this time.  Advised that I am not going to prescribe any controlled substances for patient and he states understanding.  He states there is nothing else I can do to help him right now.  I have encouraged him to stop drinking due to dangers to his health---------------------------  Discussed the use of AI scribe software for clinical note  transcription with the patient, who gave verbal consent to proceed.  History of Present Illness       Patient Active Problem List   Diagnosis Date Noted   Peripheral arterial disease 06/07/2022   Anemia 06/07/2022   Closed fracture of right tibia and fibula 01/17/2022   Tobacco use 01/17/2022   Alcohol use    Chronic pain    Hyperlipidemia    COPD (chronic obstructive pulmonary disease) (HCC) 12/20/2020   Neuropathy 06/04/2019   Alcoholism (HCC) 11/19/2018   Essential hypertension 06/06/2016   Smoker 05/12/2016   Left inguinal hernia 05/23/2014   Anxiety 09/17/2013   Gout 01/17/2013   Diverticulosis of colon 08/13/2010   DIVERTICULITIS, COLON 08/13/2010   ABDOMINAL PAIN-LLQ 08/13/2010   Nonspecific (abnormal) findings on radiological and other examination of body structure 08/13/2010   History of colonic polyps 08/13/2010   NONSPCIFC ABN FINDING RAD & OTH EXAM LUNG FIELD 08/13/2010    Past Medical History:  Diagnosis Date   Alcoholism (HCC)    Allergy    Anxiety    Arthritis    COPD (chronic obstructive pulmonary disease) (HCC)    Diverticulitis 2011   resulted in partial colectomy   Dyspnea    Essential hypertension 06/06/2016   Full dentures    Gout    Hematuria    History of kidney stones    Wears glasses     Past Surgical History:  Procedure Laterality  Date   ABDOMINAL AORTOGRAM W/LOWER EXTREMITY Bilateral 07/19/2019   Procedure: ABDOMINAL AORTOGRAM W/LOWER EXTREMITY;  Surgeon: Harvey Carlin BRAVO, MD;  Location: [facility];  Service: Cardiovascular;  Laterality: Bilateral;   ABDOMINAL AORTOGRAM W/LOWER EXTREMITY Bilateral 06/21/2022   Procedure: ABDOMINAL AORTOGRAM W/LOWER EXTREMITY;  Surgeon: Serene Gaile ORN, MD;  Location: [facility];  Service: Cardiovascular;  Laterality: Bilateral;   COLON SURGERY  2011   partial colectomy   COLONOSCOPY     FINGER ARTHROPLASTY  1990   rt index fx   HERNIA REPAIR     INGUINAL HERNIA REPAIR Left  05/27/2014   Procedure: LEFT INGUINAL HERNIA REPAIR WITH MESH;  Surgeon: Donnice POUR. Belinda, MD;  Location: [facility];  Service: General;  Laterality: Left;   INSERTION OF MESH Left 05/27/2014   Procedure: INSERTION OF MESH;  Surgeon: Donnice POUR. Belinda, MD;  Location: [facility];  Service: General;  Laterality: Left;   TIBIA IM NAIL INSERTION Right 01/18/2022   Procedure: INTRAMEDULLARY (IM) NAIL TIBIAL;  Surgeon: Elsa Lonni SAUNDERS, MD;  Location: [facility];  Service: Orthopedics;  Laterality: Right;    Social History   Tobacco Use   Smoking status: Every Day    Current packs/day: 2.00    Types: Cigarettes   Smokeless tobacco: Never  Vaping Use   Vaping status: Never Used  Substance Use Topics   Alcohol use: Yes    Alcohol/week: 21.0 standard drinks of alcohol    Types: 21 Cans of beer per week    Comment: about 3 beers daily- history of heavy liquor abuse   Drug use: No    Family History  Problem Relation Age of Onset   Alzheimer's disease Mother    Cancer Sister    Neuropathy Neg Hx     Allergies  Allergen Reactions   Ibuprofen Swelling and Other (See Comments)    Hands and face swell- had to seek medical treatment.  No issue with celecoxib     Medication list has been reviewed and updated.  Current Outpatient Medications on File Prior to Visit  Medication Sig Dispense Refill   albuterol  (VENTOLIN  HFA) 108 (90 Base) MCG/ACT inhaler Inhale 2 puffs into the lungs every 6 (six) hours as needed for wheezing or shortness of breath. 1 each 6   allopurinol  (ZYLOPRIM ) 300 MG tablet TAKE 1 TABLET(300 MG) BY MOUTH DAILY 90 tablet 3   ALPRAZolam  (XANAX ) 0.25 MG tablet TAKE 1 TABLET BY MOUTH IN THE MORNING AND AN ADDITONAL TABLET DURING THE DAY OR EVENING AS NEEDED FOR ANXIETY 45 tablet 1   Cholecalciferol (VITAMIN D3) 25 MCG (1000 UT) CAPS Take 1,000 Units by mouth daily.     gabapentin  (NEURONTIN ) 300 MG capsule Take 3 capsules (900 mg total) by mouth 3  (three) times daily. 270 capsule 5   HYDROcodone -acetaminophen  (NORCO) 10-325 MG tablet Take 1 tablet by mouth every 8 (eight) hours as needed. Visit needed for further refills 75 tablet 0   Melatonin 10 MG TABS Take 10 mg by mouth at bedtime.     rosuvastatin  (CRESTOR ) 20 MG tablet TAKE 1 TABLET(20 MG) BY MOUTH DAILY 90 tablet 3   traZODone  (DESYREL ) 50 MG tablet Take 0.5-1 tablets (25-50 mg total) by mouth at bedtime as needed for sleep. Needs appt 30 tablet 0   triamcinolone  cream (KENALOG ) 0.1 % Apply 1 Application topically 2 (two) times daily as needed (eczema on legs and ankles). 90 g 2   vitamin B-12 (CYANOCOBALAMIN )  1000 MCG tablet Take 1,000 mcg by mouth daily.     No current facility-administered medications on file prior to visit.    Review of Systems:  As per HPI- otherwise negative.   Physical Examination: There were no vitals filed for this visit. There were no vitals filed for this visit. There is no height or weight on file to calculate BMI. Ideal Body Weight:    GEN: no acute distress. HEENT: Atraumatic, Normocephalic.  Ears and Nose: No external deformity. CV: RRR, No M/G/R. No JVD. No thrill. No extra heart sounds. PULM: CTA B, no wheezes, crackles, rhonchi. No retractions. No resp. distress. No accessory muscle use. ABD: S, NT, ND, +BS. No rebound. No HSM. EXTR: No c/c/e PSYCH: Normally interactive. Conversant.    Assessment and Plan: No diagnosis found.  Assessment & Plan   Signed Harlene Schroeder, MD

## 2024-10-28 ENCOUNTER — Inpatient Hospital Stay: Admitting: Family Medicine

## 2024-11-02 ENCOUNTER — Other Ambulatory Visit: Payer: Self-pay | Admitting: Family Medicine

## 2024-11-02 DIAGNOSIS — F5101 Primary insomnia: Secondary | ICD-10-CM

## 2024-11-04 DIAGNOSIS — F17210 Nicotine dependence, cigarettes, uncomplicated: Secondary | ICD-10-CM | POA: Diagnosis not present

## 2024-11-04 DIAGNOSIS — E875 Hyperkalemia: Secondary | ICD-10-CM | POA: Diagnosis not present

## 2024-11-04 DIAGNOSIS — E861 Hypovolemia: Secondary | ICD-10-CM | POA: Diagnosis not present

## 2024-11-04 DIAGNOSIS — R350 Frequency of micturition: Secondary | ICD-10-CM | POA: Diagnosis not present

## 2024-11-04 DIAGNOSIS — Z79899 Other long term (current) drug therapy: Secondary | ICD-10-CM | POA: Diagnosis not present

## 2024-11-06 DIAGNOSIS — J449 Chronic obstructive pulmonary disease, unspecified: Secondary | ICD-10-CM | POA: Diagnosis not present

## 2024-11-06 DIAGNOSIS — E7800 Pure hypercholesterolemia, unspecified: Secondary | ICD-10-CM | POA: Diagnosis not present

## 2024-11-06 DIAGNOSIS — Z886 Allergy status to analgesic agent status: Secondary | ICD-10-CM | POA: Diagnosis not present

## 2024-11-06 DIAGNOSIS — F17210 Nicotine dependence, cigarettes, uncomplicated: Secondary | ICD-10-CM | POA: Diagnosis not present

## 2024-11-06 DIAGNOSIS — I951 Orthostatic hypotension: Secondary | ICD-10-CM | POA: Diagnosis not present

## 2024-11-06 DIAGNOSIS — I959 Hypotension, unspecified: Secondary | ICD-10-CM | POA: Diagnosis not present

## 2024-11-06 DIAGNOSIS — R350 Frequency of micturition: Secondary | ICD-10-CM | POA: Diagnosis not present

## 2024-11-06 DIAGNOSIS — E860 Dehydration: Secondary | ICD-10-CM | POA: Diagnosis not present

## 2024-11-06 DIAGNOSIS — E861 Hypovolemia: Secondary | ICD-10-CM | POA: Diagnosis not present

## 2024-11-06 DIAGNOSIS — N189 Chronic kidney disease, unspecified: Secondary | ICD-10-CM | POA: Diagnosis not present

## 2024-11-06 DIAGNOSIS — Z79899 Other long term (current) drug therapy: Secondary | ICD-10-CM | POA: Diagnosis not present

## 2024-11-14 DIAGNOSIS — R3914 Feeling of incomplete bladder emptying: Secondary | ICD-10-CM | POA: Diagnosis not present

## 2024-11-14 DIAGNOSIS — N5203 Combined arterial insufficiency and corporo-venous occlusive erectile dysfunction: Secondary | ICD-10-CM | POA: Diagnosis not present

## 2024-11-14 DIAGNOSIS — N401 Enlarged prostate with lower urinary tract symptoms: Secondary | ICD-10-CM | POA: Diagnosis not present
